# Patient Record
Sex: Female | Born: 1945 | Race: White | Hispanic: No | Marital: Married | Smoking: Former smoker
Health system: Southern US, Community
[De-identification: ages and names within clinical notes are randomized; demographics above are authoritative.]

## PROBLEM LIST (undated history)

## (undated) DIAGNOSIS — E7800 Pure hypercholesterolemia, unspecified: Secondary | ICD-10-CM

## (undated) DIAGNOSIS — I10 Essential (primary) hypertension: Secondary | ICD-10-CM

## (undated) DIAGNOSIS — I639 Cerebral infarction, unspecified: Secondary | ICD-10-CM

## (undated) DIAGNOSIS — I480 Paroxysmal atrial fibrillation: Secondary | ICD-10-CM

## (undated) DIAGNOSIS — E669 Obesity, unspecified: Secondary | ICD-10-CM

## (undated) DIAGNOSIS — M545 Low back pain, unspecified: Secondary | ICD-10-CM

## (undated) DIAGNOSIS — G8929 Other chronic pain: Secondary | ICD-10-CM

## (undated) HISTORY — DX: Cerebral infarction, unspecified: I63.9

## (undated) HISTORY — DX: Pure hypercholesterolemia, unspecified: E78.00

## (undated) HISTORY — DX: Essential (primary) hypertension: I10

## (undated) HISTORY — DX: Paroxysmal atrial fibrillation: I48.0

## (undated) HISTORY — DX: Obesity, unspecified: E66.9

---

## 1998-08-20 ENCOUNTER — Other Ambulatory Visit: Admission: RE | Admit: 1998-08-20 | Discharge: 1998-08-20 | Payer: Self-pay | Admitting: Gynecology

## 1999-10-20 ENCOUNTER — Other Ambulatory Visit: Admission: RE | Admit: 1999-10-20 | Discharge: 1999-10-20 | Payer: Self-pay | Admitting: Gynecology

## 2000-01-12 ENCOUNTER — Encounter: Admission: RE | Admit: 2000-01-12 | Discharge: 2000-01-12 | Payer: Self-pay | Admitting: Gynecology

## 2000-01-12 ENCOUNTER — Encounter: Payer: Self-pay | Admitting: Gynecology

## 2000-02-09 ENCOUNTER — Inpatient Hospital Stay (HOSPITAL_COMMUNITY): Admission: EM | Admit: 2000-02-09 | Discharge: 2000-02-14 | Payer: Self-pay | Admitting: Emergency Medicine

## 2000-02-09 ENCOUNTER — Encounter: Payer: Self-pay | Admitting: Emergency Medicine

## 2000-03-30 ENCOUNTER — Inpatient Hospital Stay (HOSPITAL_COMMUNITY): Admission: RE | Admit: 2000-03-30 | Discharge: 2000-04-04 | Payer: Self-pay | Admitting: *Deleted

## 2000-03-30 ENCOUNTER — Encounter: Payer: Self-pay | Admitting: *Deleted

## 2000-03-31 HISTORY — PX: CARDIAC CATHETERIZATION: SHX172

## 2000-05-03 ENCOUNTER — Other Ambulatory Visit: Admission: RE | Admit: 2000-05-03 | Discharge: 2000-05-03 | Payer: Self-pay | Admitting: Gynecology

## 2000-08-29 ENCOUNTER — Ambulatory Visit (HOSPITAL_COMMUNITY): Admission: RE | Admit: 2000-08-29 | Discharge: 2000-08-29 | Payer: Self-pay | Admitting: *Deleted

## 2000-10-24 ENCOUNTER — Inpatient Hospital Stay (HOSPITAL_COMMUNITY): Admission: AD | Admit: 2000-10-24 | Discharge: 2000-10-27 | Payer: Self-pay | Admitting: Cardiology

## 2000-12-27 ENCOUNTER — Other Ambulatory Visit: Admission: RE | Admit: 2000-12-27 | Discharge: 2000-12-27 | Payer: Self-pay | Admitting: Gynecology

## 2001-03-20 ENCOUNTER — Encounter: Payer: Self-pay | Admitting: Gynecology

## 2001-03-20 ENCOUNTER — Encounter: Admission: RE | Admit: 2001-03-20 | Discharge: 2001-03-20 | Payer: Self-pay | Admitting: Gynecology

## 2001-06-13 ENCOUNTER — Other Ambulatory Visit: Admission: RE | Admit: 2001-06-13 | Discharge: 2001-06-13 | Payer: Self-pay | Admitting: Gynecology

## 2001-07-03 ENCOUNTER — Emergency Department (HOSPITAL_COMMUNITY): Admission: EM | Admit: 2001-07-03 | Discharge: 2001-07-03 | Payer: Self-pay | Admitting: Emergency Medicine

## 2001-07-03 ENCOUNTER — Encounter: Payer: Self-pay | Admitting: Emergency Medicine

## 2002-02-19 ENCOUNTER — Encounter: Admission: RE | Admit: 2002-02-19 | Discharge: 2002-02-19 | Payer: Self-pay | Admitting: Family Medicine

## 2002-02-19 ENCOUNTER — Encounter: Payer: Self-pay | Admitting: Family Medicine

## 2002-03-07 ENCOUNTER — Encounter: Admission: RE | Admit: 2002-03-07 | Discharge: 2002-03-23 | Payer: Self-pay | Admitting: Family Medicine

## 2002-04-10 ENCOUNTER — Other Ambulatory Visit: Admission: RE | Admit: 2002-04-10 | Discharge: 2002-04-10 | Payer: Self-pay | Admitting: Gynecology

## 2002-04-29 ENCOUNTER — Emergency Department (HOSPITAL_COMMUNITY): Admission: EM | Admit: 2002-04-29 | Discharge: 2002-04-29 | Payer: Self-pay | Admitting: Emergency Medicine

## 2002-06-01 ENCOUNTER — Encounter: Payer: Self-pay | Admitting: Family Medicine

## 2002-06-01 ENCOUNTER — Encounter: Admission: RE | Admit: 2002-06-01 | Discharge: 2002-06-01 | Payer: Self-pay | Admitting: Family Medicine

## 2003-05-08 ENCOUNTER — Other Ambulatory Visit: Admission: RE | Admit: 2003-05-08 | Discharge: 2003-05-08 | Payer: Self-pay | Admitting: Gynecology

## 2003-06-27 ENCOUNTER — Encounter: Admission: RE | Admit: 2003-06-27 | Discharge: 2003-06-27 | Payer: Self-pay | Admitting: Gynecology

## 2003-06-27 ENCOUNTER — Encounter: Payer: Self-pay | Admitting: Gynecology

## 2004-07-28 ENCOUNTER — Ambulatory Visit (HOSPITAL_COMMUNITY): Admission: RE | Admit: 2004-07-28 | Discharge: 2004-07-28 | Payer: Self-pay | Admitting: Gynecology

## 2004-08-12 ENCOUNTER — Other Ambulatory Visit: Admission: RE | Admit: 2004-08-12 | Discharge: 2004-08-12 | Payer: Self-pay | Admitting: Gynecology

## 2004-08-28 ENCOUNTER — Encounter: Admission: RE | Admit: 2004-08-28 | Discharge: 2004-08-28 | Payer: Self-pay | Admitting: Gynecology

## 2005-08-23 ENCOUNTER — Other Ambulatory Visit: Admission: RE | Admit: 2005-08-23 | Discharge: 2005-08-23 | Payer: Self-pay | Admitting: Gynecology

## 2005-10-26 ENCOUNTER — Encounter: Admission: RE | Admit: 2005-10-26 | Discharge: 2005-10-26 | Payer: Self-pay | Admitting: Gynecology

## 2006-06-08 ENCOUNTER — Ambulatory Visit: Payer: Self-pay | Admitting: Family Medicine

## 2006-06-29 ENCOUNTER — Ambulatory Visit: Payer: Self-pay | Admitting: Family Medicine

## 2006-07-05 ENCOUNTER — Ambulatory Visit: Payer: Self-pay | Admitting: Internal Medicine

## 2006-07-06 ENCOUNTER — Ambulatory Visit: Payer: Self-pay | Admitting: Family Medicine

## 2006-07-26 ENCOUNTER — Ambulatory Visit: Payer: Self-pay | Admitting: Internal Medicine

## 2006-12-06 ENCOUNTER — Other Ambulatory Visit: Admission: RE | Admit: 2006-12-06 | Discharge: 2006-12-06 | Payer: Self-pay | Admitting: Gynecology

## 2006-12-09 ENCOUNTER — Encounter: Admission: RE | Admit: 2006-12-09 | Discharge: 2006-12-09 | Payer: Self-pay | Admitting: Gynecology

## 2007-05-22 ENCOUNTER — Ambulatory Visit: Payer: Self-pay | Admitting: Internal Medicine

## 2007-05-26 ENCOUNTER — Ambulatory Visit: Payer: Self-pay | Admitting: Internal Medicine

## 2007-05-26 ENCOUNTER — Encounter: Payer: Self-pay | Admitting: Internal Medicine

## 2007-07-18 DIAGNOSIS — I4891 Unspecified atrial fibrillation: Secondary | ICD-10-CM | POA: Insufficient documentation

## 2007-07-18 DIAGNOSIS — Z8679 Personal history of other diseases of the circulatory system: Secondary | ICD-10-CM | POA: Insufficient documentation

## 2007-07-18 DIAGNOSIS — I10 Essential (primary) hypertension: Secondary | ICD-10-CM | POA: Insufficient documentation

## 2007-08-01 ENCOUNTER — Ambulatory Visit: Payer: Self-pay | Admitting: Internal Medicine

## 2007-12-15 DIAGNOSIS — K5900 Constipation, unspecified: Secondary | ICD-10-CM | POA: Insufficient documentation

## 2007-12-15 DIAGNOSIS — D126 Benign neoplasm of colon, unspecified: Secondary | ICD-10-CM | POA: Insufficient documentation

## 2007-12-15 DIAGNOSIS — K648 Other hemorrhoids: Secondary | ICD-10-CM | POA: Insufficient documentation

## 2007-12-15 DIAGNOSIS — J329 Chronic sinusitis, unspecified: Secondary | ICD-10-CM | POA: Insufficient documentation

## 2007-12-15 DIAGNOSIS — E785 Hyperlipidemia, unspecified: Secondary | ICD-10-CM | POA: Insufficient documentation

## 2007-12-15 DIAGNOSIS — M1990 Unspecified osteoarthritis, unspecified site: Secondary | ICD-10-CM | POA: Insufficient documentation

## 2007-12-15 DIAGNOSIS — K625 Hemorrhage of anus and rectum: Secondary | ICD-10-CM | POA: Insufficient documentation

## 2008-05-15 ENCOUNTER — Encounter: Admission: RE | Admit: 2008-05-15 | Discharge: 2008-05-15 | Payer: Self-pay | Admitting: Gynecology

## 2008-05-23 ENCOUNTER — Encounter: Admission: RE | Admit: 2008-05-23 | Discharge: 2008-05-23 | Payer: Self-pay | Admitting: Gynecology

## 2008-06-10 ENCOUNTER — Other Ambulatory Visit: Admission: RE | Admit: 2008-06-10 | Discharge: 2008-06-10 | Payer: Self-pay | Admitting: Gynecology

## 2008-06-20 ENCOUNTER — Encounter: Admission: RE | Admit: 2008-06-20 | Discharge: 2008-06-20 | Payer: Self-pay | Admitting: Gynecology

## 2009-07-02 ENCOUNTER — Encounter: Admission: RE | Admit: 2009-07-02 | Discharge: 2009-07-02 | Payer: Self-pay | Admitting: Gynecology

## 2010-06-12 ENCOUNTER — Ambulatory Visit: Payer: Self-pay | Admitting: Cardiology

## 2010-07-17 ENCOUNTER — Ambulatory Visit: Payer: Self-pay | Admitting: *Deleted

## 2010-07-31 ENCOUNTER — Encounter: Admission: RE | Admit: 2010-07-31 | Discharge: 2010-07-31 | Payer: Self-pay | Admitting: Gynecology

## 2010-08-14 ENCOUNTER — Ambulatory Visit: Payer: Self-pay | Admitting: Cardiology

## 2010-09-09 ENCOUNTER — Ambulatory Visit: Payer: Self-pay | Admitting: Cardiology

## 2010-10-07 ENCOUNTER — Ambulatory Visit: Payer: Self-pay | Admitting: Cardiovascular Disease

## 2010-10-23 ENCOUNTER — Ambulatory Visit: Payer: Self-pay | Admitting: Cardiology

## 2010-11-19 ENCOUNTER — Encounter (INDEPENDENT_AMBULATORY_CARE_PROVIDER_SITE_OTHER): Payer: Medicare Other

## 2010-11-19 DIAGNOSIS — Z7901 Long term (current) use of anticoagulants: Secondary | ICD-10-CM

## 2010-11-24 ENCOUNTER — Inpatient Hospital Stay (INDEPENDENT_AMBULATORY_CARE_PROVIDER_SITE_OTHER)
Admission: RE | Admit: 2010-11-24 | Discharge: 2010-11-24 | Disposition: A | Discharge status: Home / Self Care | Payer: Medicare Other | Source: Ambulatory Visit | Attending: Family Medicine | Admitting: Family Medicine

## 2010-11-24 DIAGNOSIS — R071 Chest pain on breathing: Secondary | ICD-10-CM

## 2010-12-18 ENCOUNTER — Other Ambulatory Visit (INDEPENDENT_AMBULATORY_CARE_PROVIDER_SITE_OTHER): Payer: Medicare Other

## 2010-12-18 DIAGNOSIS — Z7901 Long term (current) use of anticoagulants: Secondary | ICD-10-CM

## 2011-01-15 ENCOUNTER — Ambulatory Visit: Payer: Self-pay | Admitting: *Deleted

## 2011-01-15 ENCOUNTER — Other Ambulatory Visit (INDEPENDENT_AMBULATORY_CARE_PROVIDER_SITE_OTHER): Payer: Medicare Other | Admitting: *Deleted

## 2011-01-15 ENCOUNTER — Other Ambulatory Visit: Payer: Self-pay | Admitting: Cardiology

## 2011-01-15 DIAGNOSIS — I4891 Unspecified atrial fibrillation: Secondary | ICD-10-CM

## 2011-01-15 DIAGNOSIS — E785 Hyperlipidemia, unspecified: Secondary | ICD-10-CM

## 2011-01-15 MED ORDER — FENOFIBRATE 160 MG PO TABS: 160.0000 mg | ORAL_TABLET | Freq: Every day | ORAL | Status: DC

## 2011-01-15 NOTE — Telephone Encounter (Signed)
escribe medication per fax request  Notified of coumadin results

## 2011-01-29 ENCOUNTER — Other Ambulatory Visit: Payer: Medicare Other | Admitting: *Deleted

## 2011-01-29 ENCOUNTER — Ambulatory Visit (INDEPENDENT_AMBULATORY_CARE_PROVIDER_SITE_OTHER): Payer: Medicare Other | Admitting: *Deleted

## 2011-01-29 DIAGNOSIS — I4891 Unspecified atrial fibrillation: Secondary | ICD-10-CM

## 2011-02-12 ENCOUNTER — Ambulatory Visit (INDEPENDENT_AMBULATORY_CARE_PROVIDER_SITE_OTHER): Payer: Medicare Other | Admitting: *Deleted

## 2011-02-12 DIAGNOSIS — I4891 Unspecified atrial fibrillation: Secondary | ICD-10-CM

## 2011-02-12 LAB — POCT INR: INR: 4.3

## 2011-02-12 NOTE — Patient Instructions (Signed)
Pt notified of Coumadin instructions and verbalized to RN understanding of instructions.   

## 2011-02-26 ENCOUNTER — Ambulatory Visit (INDEPENDENT_AMBULATORY_CARE_PROVIDER_SITE_OTHER): Payer: Medicare Other | Admitting: *Deleted

## 2011-02-26 DIAGNOSIS — I4891 Unspecified atrial fibrillation: Secondary | ICD-10-CM

## 2011-03-02 NOTE — Assessment & Plan Note (Signed)
[facility]                         GASTROENTEROLOGY OFFICE NOTE   NAME:BAILEYQuinlan, Mcfall                         MRN:          [id number]  DATE:08/01/2007                            DOB:          [birth year]    CHIEF COMPLAINT:  Bloating and gas, some constipation.   Ms. Hebenstreit had a colonoscopy August 8th.  She had 1 of 3 polyps as an  adenoma, due for followup in 5 years.  At that time, she discussed some  bloating and discomfort problems.  She was started on Align, and has had  some benefit.  Unfortunately, she still suffers from periodic  constipation helped by Milk of Magnesia.  She also has spells of  bloating.  These are less if she exercises, but she sits in front of a  computer a lot all day, and she just feels miserable sometimes with this  bloating and gaseousness.   No change in her past medical history reviewed from note of July 26, 2006, plus colonoscopy note.   MEDICATIONS:  Listed and reviewed in the chart.   PHYSICAL EXAMINATION:  Height 5 feet 6-1/2 inches.  Weight 166 pounds.  Pulse 68.  Blood pressure 122/62.  Body mass index 26.4 kg per meter  squared, which is over weight.  ABDOMEN:  Soft and nontender.  No organomegaly or masses.  Does not look  particularly distended.  Bowel sounds are present.  SKIN:  Warm and dry.  No rash.  She is alert and oriented x3.   ASSESSMENT:  I think this is irritable bowel syndrome with bloating and  gaseousness leaning toward constipation.   PLAN:  1. We are going to try Xifaxan since she has responded somewhat to      Align, 400 mg t.i.d. for 10 days.  If that is too expensive for      her, depending upon the coverage availability, we will use Cipro      500 mg b.i.d. for 10 days.  The rationale for antibiotic use in      irritable bowel syndrome was explained.  2. She will contact Dr. Ronnald Nian office prior to starting antibiotics      for advice on followup of her INRs since she takes  Coumadin      chronically.  3. She will continue the Align.  4. Gas and irritable bowel handouts are given to the patient.  5. She will follow up as needed, depending upon the response or lack      thereof.     Iva Boop, MD,FACG  Electronically Signed    CEG/MedQ  DD: 08/01/2007  DT: 08/01/2007  Job #: [id number]   cc:   Bess Kinds, MD  Colleen Can Deborah Chalk, M.D.

## 2011-03-05 NOTE — H&P (Signed)
[place]. [facility]  Patient:    Alexandra Pierce, Alexandra Pierce                         MRN: [id number] Adm. Date:  [id number] Attending:  Lorre Nick Dictator:   Marya Fossa, P.A. CC:         [facility]             Lenise Herald, M.D.                         History and Physical  ADMISSION DIAGNOSES: 1. New onset atrial fibrillation. 2. Hyperlipidemia. 3. Hormone replacement therapy.  CHIEF COMPLAINT:  Right arm numbness, tongue "thick," and lightheadedness x 24 hours with "hard heartbeats."  HISTORY OF PRESENT ILLNESS:  This is a 65 year old divorced white female patient of Dr. Loraine Grip with a history of palpitations.  Yesterday, while at work, she noticed that her right hand was numb and she had difficulty writing.  She also felt that her tongue had a thickness sensation to it and her speech was not clear per her daughters.  She had a very "spacey" feeling about her and was aware of "hard heartbeats."  All throughout yesterday evening and through to this morning, she has not felt quite right. She had trouble putting on her makeup this morning.  She did go to work and, while trying to type, noticed that her left hand was typing normally but the right hand just "couldnt find the right keys."  The patient denies history of CVA, hypertension, diabetes, or thyroid disease.  Prior to yesterday, the patient had been doing relatively well.  She has been seeing Dr. Jenne Campus for the last several months with complaints of palpitations.  Her EKGs in the office have always shown sinus rhythm and, due to financial constraints, she has not worn a Holter monitor.  On January 22, 2000, the patient was placed on Toprol XL 25 mg a day.  She had a 2-D echocardiogram in March, which showed essentially normal LV function with slightly thickened mitral valve leaflet and trace MR.  The patient denies presyncope or syncope.  As an aside, the patient also noted  frequent sensations of chest tightness at rest.  She has a history of cardiac catheterization in 1996 by Dr. Alanda Amass with reportedly normal coronaries.  At that time, she was going through a divorce and was told her symptoms were related to stress and reflux.  The tightness occurs primarily when she is having the "hard heartbeats."  ALLERGIES:  NKDA.  CURRENT MEDICATIONS: 1. Prempro 0.5 mg a day. 2. Toprol XL 25 mg a day. 3. Lipitor 20 mg a day.  PAST MEDICAL HISTORY:  See HPI.  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 She drinks wine.  She does not smoke.  She quit in 1985.  FAMILY HISTORY:  Her mother is alive at age 46 and has heart disease and hyperlipidemia.  Father deceased from diabetes and asthma.  She has two brothers; one has hyperlipidemia and one is age 63 with coronary artery disease and has had a CABG and prostate CA.  She has one sister with hyperlipidemia.  REVIEW OF SYSTEMS:  See HPI.  Denies vision changes, headaches, presyncope or syncope, peptic ulcer disease, melena, hematuria, vaginal bleeding, dysuria, fever, chills, or weight changes.  Of note, she had a total lipid profile drawn in April,  which showed a total cholesterol of 243 with an LDL of 171 and she was started on Lipitor 20 mg a day on January 22, 2000.  PHYSICAL EXAMINATION:  VITAL SIGNS:  Temperature 97.9, blood pressure 123/65.  Pulse on admission was 40 but then the patient went into rapid atrial fibrillation with a heart rate in the 160s.  Respirations 16, O2 saturation 99% on room air.  GENERAL:  The patient was alert and oriented x 2.  No acute distress. Attended by her two daughters.  HEENT:  Normocephalic, atraumatic.  PERRL; EOMI.  Funduscopic unremarkable. Nares patent.  Pharynx benign.  NECK:  Supple without bruits or masses.  LUNGS:  Clear to auscultation and percussion.  COR:  Irregular rate and rhythm without murmur.  ABDOMEN:  Soft,  nontender, nondistended.  Normal bowel sounds x 4 quadrants. No HSM.  EXTREMITIES:  2+ distal pulses without edema.  NEUROLOGIC:  Cranial nerves II-XII grossly intact.  No focal deficits noted. Strength is equal upper and lower extremities bilaterally.  LABORATORY DATA:  EKG shows atrial fibrillation with rapid ventricular response.  Heart rate is 130-150.  Chest x-ray shows no active disease.  Sodium 140, potassium 3.9, BUN 16, creatinine 0.8, glucose 96, hemoglobin 13.9.  INR 1.4, CK 89.  IMPRESSION:  New onset atrial fibrillation with rapid ventricular response--she was given IV Cardizem bolus of 20 mg and infusion at 10 mg an hour with heart rates decreased to 80-100, atrial fibrillation persisting.  IV heparin was started.  We will admit the patient and check cardiac enzymes to rule out MI.  We will obtain old records and old catheterization report.  Add proton pump inhibitor.  We will check a head CT to rule out CVA/TIA.  Continue hormone replacement therapy.  Hyperlipidemia:  Continue statin therapy.  ADDENDUM:  At 1300 hours (one hour after H&P was performed), the patient converted to normal sinus rhythm documented with EKG.  Head CT results are pending.  Course of action to be determined by Dr. Armanda Magic. DD:  02/09/00 TD:  02/09/00 Job: [id number]

## 2011-03-05 NOTE — Discharge Summary (Signed)
[place]. [facility]  Patient:    Alexandra Pierce, Alexandra Pierce                         MRN: [id number] Adm. Date:  [id number] Disc. Date: 10/27/00 Attending:  Eleanora Neighbor Dictator:   Jennet Maduro. Earl Gala, R.N., A.N.P. CC:         Nathen May, M.D., [facility]  [facility]   Discharge Summary  PRIMARY DISCHARGE DIAGNOSIS:   Recurrent episodes of atrial fibrillation/flutter with successful initiation of flecainide.  SECONDARY DIAGNOSES: 1. Prior paroxysmal atrial fibrillation/flutter, previously on sotolol as    well as short course of amiodarone. 2. Previous cerebrovascular accident involving right arm weakness as well    as mild dysarthria, April 2001, secondary to arrhythmia. 3. Chronic Coumadin. 4. Hypercholesterolemia, currently on Lipitor. 5. Prior history of cardiac catheterization x 2 showing normal left    ventricular function, no significant coronary disease.  HISTORY OF PRESENT ILLNESS:  Alexandra Pierce is a very pleasant 65 year old female who presents for elective admission for flecainide therapy.  She has had recurrent episodes of paroxysmal atrial fibrillation.  She has been maintained on chronic Coumadin.  ADMISSION LABORATORY DATA:  INR was 2.1.  Her other labs from the office were satisfactory.  HOSPITAL COURSE:  The patient was admitted.   She was started on flecainide initially at 50 mg b.i.d. which was tolerated very well.  She did have recurrent episodes of atrial fibrillation/flutter and was subsequently seen in consultation by Dr. Berton Mount to consider possibility of future ablation. Throughout the course of her hospitalization, she has basically done well. She has had very little in the way of sinus bradycardia.  Her Cardizem was able to be discontinued.  Today on October 27, 2000, she is doing quite well.  She is currently on flecainide 50 mg in the morning and 100 mg in the evening.  She is already beginning to  note fewer episodes of arrhythmia.  Our plans will now be to see how she does on the drug therapy before we would consider proceeding on with ablation, especially since she is obligated to Coumadin at this point in time.  We will now plan for discharge today with plans for outpatient stress testing next week.  CONDITION UPON DISCHARGE:  Stable.  DISCHARGE MEDICATIONS:  She will stop her Diltiazem.  Will continue flecainide 50 mg in the morning, 100 mg in the evening.  She will resume her Coumadin, except we will increase that slightly due to an increase in INR today at discharge at 1.7.  She will now be taking 2.5 mg 5 days a week and 1.25 mg on Monday and Wednesday.  She will resume her Protonix, Prempro, and Lipitor as before.  ACTIVITY:  As tolerated.  SPECIAL INSTRUCTIONS:  She has an appointment scheduled for Wednesday, January 16, at 3:30 p.m. for treadmill testing as well as followup pro time.  She is to call if any problems arise. DD:  10/27/00 TD:  10/27/00 Job: [id number]

## 2011-03-05 NOTE — Discharge Summary (Signed)
[place]. [facility]  Patient:    Alexandra Pierce, Alexandra Pierce                         MRN: [id number] Adm. Date:  [id number] Disc. Date: [id number] Attending:  Darlin Priestly Dictator:   Darcella Gasman. Annie Paras, F.N.P.C. CC:         [facility]                           Discharge Summary  ADMITTING DIAGNOSES: 1. Paroxysmal atrial fibrillation with rapid ventricular response, resolved. 2. Hyperlipidemia. 3. Transient ischemic attack. 4. Gastroesophageal reflux disease. 5. Hypokalemia, resolved. 6. Supraventricular tachycardia. 7. Sinus bradycardia, resolved. 8. Sinuses pauses, resolved.  DISCHARGE CONDITION:  Improved.  DISCHARGE MEDICATIONS: 1. Sotalol 80 mg twice a day. 2. Cardizem 30 mg four times a day. 3. Coumadin 5 mg a day. 4. Altace 2.5 mg a day. 5. Prempro 0.5 mg a day. 6. Lipitor 20 mg at h.s. 7. Protonix 40 mg daily. 8. Ativan 0.5 mg one p.o. q.8h. as needed for anxiety.  DISCHARGE INSTRUCTIONS: 1. Activity as tolerated. 2. Low-fat, low-cholesterol, low-salt diet. 3. Have lab work done Feb 16, 2000, at [facility]. 4. Follow up with Dr. Jenne Campus in two weeks.  Call the office Monday for that    appointment date and time.  HISTORY OF PRESENT ILLNESS:  A 65 year old divorced white female patient of Dr. Loraine Grip with a history of palpitations.  On the day prior to admission, at work, she noticed her right hand went numb and she had difficulty writing. Her tongue had a thickness sensation to it and her speech was not clear.  She felt "spacy."  Patient was seen in the emergency room, February 09, 2000, and did not feel quite right since the day before.  Prior to admission, patient had not had any difficulties.  She had been followed by Dr. Jenne Campus for palpitations.  EKGs in the office had shown sinus rhythm and, secondary to financial constraints, she had not worn a Holter monitor.  She was placed on Toprol XL 25 mg daily on April 6 and a 2D  echocardiogram in March revealed essentially normal LV function with slightly thickened mitral valve leaflet and trace MR.  Prior to admission, she also noticed frequent sensations of chest tightness.  She does have a history of a heart catheterization by Dr. Alanda Amass in 1996 and was told she had normal coronaries.  She was going through a divorce at the time and it was felt the chest discomfort was related to reflux disease.  ALLERGIES:  No known allergies.  OUTPATIENT MEDICATIONS: 1. Prempro 0.5 daily. 2. Toprol XL 25 daily. 3. Lipitor 20 daily.  PAST MEDICAL HISTORY:  Negative for diabetes, thyroid disease.  See history of present illness otherwise.  SOCIAL HISTORY, FAMILY HISTORY, REVIEW OF SYSTEMS:  See history and physical.  PHYSICAL EXAMINATION AT DISCHARGE:  VITAL SIGNS:  Blood pressure 114/60.  QTC was 44.  Heart rate 56, respirations 20, temperature 98.1.  Room air oxygen saturation 99%.  GENERAL:  Alert, oriented white female in no acute distress.  LUNGS:  Without rales.  HEART:  Regular rate and rhythm without S3 gallop.  EXTREMITIES:  Lower extremities without edema.  LABORATORY DATA:  Hemoglobin 13.9, hematocrit 39.4.  Prior to discharge, hemoglobin 11.9, hematocrit 33.7, WBC 7.1, MCV 90.7, platelets 430, neutrophils 61, lymphocytes 31, monocytes 3, eosinophils 2, basophils  1, leukocytes 2.  Pro time 15.4, INR 1.4, PTT 35.  Heparin was therapeutic and, prior to discharge, pro time 22.3, INR 2.8.  Chemistry:  Sodium 140, potassium 3.9, chloride 104, CO2 25, glucose 96, BUN 16, creatinine 0.8, calcium 9.3.  These remained stable.  Cardiac markers:  CK ranged from 89 to 59, MBs 1.8 to 1.0.  Troponin I less than 0.03, all negative for MI.  TSH 1.31, T3 30.5, and T4 7.2.  Urinalysis was clear.  Radiology:  Portable chest on February 09, 2000:  No evidence of acute disease. CT of the head without contrast:  Focal abnormal lucency within the first frontal cortex  which, on CT, measures roughly 1 cm and likely corresponds to a subacute infarction.  Further characterization may be helpful.  No visible hemorrhage.  On February 10, 2000, carotid Dopplers:  No significant ICA stenosis bilaterally, antegrade vertebral artery flow bilaterally.  EKG on admission, February 09, 2000, at 10:30 a.m., atrial fibrillation with rapid ventricular response, ST abnormality, atrial fibrillation new from April 19, 1995.  Follow-up at 12:49 that same day, sinus rhythm, normal EKG.  On April 25, sinus brady, otherwise normal.  On April 27, atrial fibrillation with PVCs, new atrial fibrillation and then back to sinus rhythm later that day.  On April 28, marked sinus bradycardia, heart rate 48.  HOSPITAL COURSE:  Alexandra Pierce was admitted to [facility] on February 09, 2000, with right arm numbness, a thick tongue, light-headed for 24 hours, and palpitations.  She was found to be in atrial fibrillation with rapid ventricular response.  A CT of her head showed questionable lucency, subacute infarction.  She was placed on IV heparin and IV Cardizem drip, admitted to 2000 telemetry.  CK-MBs came back all negative.  Coumadin was started and her Cardizem was changed to p.o.  She continued with paroxysmal atrial fibrillation.  She was started on sotalol and her Toprol had been discontinued.  At that point, her Cardizem was decreased to a lower rate.  By April 28, she was ready for discharge and then had some tachycardia.  The discharge was held and she was monitored for 24 more hours.  By February 14, 2000, she was ready for discharge and will be seen as an outpatient by Dr. Jenne Campus. DD:  03/14/00 TD:  03/14/00 Job: [id number]

## 2011-03-05 NOTE — Cardiovascular Report (Signed)
[place]. [facility]  Patient:    Alexandra Pierce, Alexandra Pierce                         MRN: [id number] Proc. Date: 03/31/00 Adm. Date:  [id number] Disc. Date: [id number] Attending:  Lenise Herald H                        Cardiac Catheterization  PROCEDURE: 1. Left heart catheterization. 2. Coronary angiography. 3. Left ventriculogram. 4. Distal aortogram.  ATTENDING FOR THE CASE:  Lenise Herald, M.D.  COMPLICATIONS:  None.  INDICATIONS:  [name] is a 65 year old white female with a history of intermittent atrial fibrillation, now failed sotalol therapy.  She recently has complained of chest pain and underwent a stress Cardiolite which revealed evidence of anterior wall ischemia as well as inferoseptal scar with an EF of 49%.  She is now referred for cardiac catheterization.  DESCRIPTION OF PROCEDURE:  After giving informed written consent, the patient was brought to the cardiac catheterization lab where her right and left groins were shaved, prepped, and draped in the usual sterile fashion.  ECG monitoring was established.  Using modified Seldinger technique, a 6-French arterial sheath was inserted in the right femoral artery.  The 6-French diagnostic catheters were then used to perform diagnostic angiography.  FINDINGS: 1. This revealed a large left main with no significant disease. 2. The LAD is a large vessel which coursed to the apex and gave rise to two    diagonal branches.  The LAD is noted to have a 30% ostial lesion and tapers    to a small vessel toward the apex but has no high grade lesion.  The first    diagonal is a medium sized vessel with no significant disease.  The second    diagonal is a small vessel with no significant disease. 3. The left circumflex is a large vessel which is codominant and gives rise to    two obtuse marginal branches as well as a PDA.  The AV groove circumflex    has no significant disease.  The first and second OMs are  medium sized    vessels with no significant disease.  The third OM is a medium sized vessel    with no significant disease. 4. The right coronary artery is a medium sized vessel which is codominant and    gives rise to a small PDA and posterolateral branch.  A left ventriculogram reveals mild global hypokinesis with an estimated EF of 45-50%.  Descending aortogram reveals a normal descending aorta with no evidence of renal artery stenosis.  HEMODYNAMIC DATA:  Systemic arterial pressure 145/83, LV systemic pressure 145/12.  LVEDP of 18.  Following cardiac catheterization, the Perclose device was used to successfully close the right femoral artery without complications.  One gram of Ancef was given prophylactically.  CONCLUSIONS: 1. No significant CAD. 2. Mildly depressed LV systolic function. 3. Systemic hypertension. DD:  03/31/00 TD:  04/04/00 Job: [id number]

## 2011-03-05 NOTE — H&P (Signed)
[place]. [facility]  Patient:    Alexandra, Pierce                         MRN: [id number] Adm. Date:  [id number] Attending:  Eleanora Neighbor Dictator:   Jennet Maduro. Earl Gala, R.N., A.N.P. CC:         [facility]   History and Physical  CHIEF COMPLAINT:  None.  HISTORY OF PRESENT ILLNESS:  Alexandra Pierce is a very pleasant 65 year old white female who presents for elective admission with known recurrent episodes of paroxysmal atrial fibrillation.  She has previously been placed on sotalol and has also had a short course of amiodarone.  She has subsequently had breakthrough episodes of palpitations, consistent with atrial fibrillation as well as occasional chest tightness and she is subsequently referred for elective admission to initiate further antiarrhythmic therapy.  PAST MEDICAL HISTORY: 1. Paroxysmal atrial fibrillation previously on sotalol as well as amiodarone. 2. Prior history of cerebrovascular accident involving right arm weakness as    well as mild dysarthria in April 2001, secondary to atrial fibrillation. 3. Chronic Coumadin therapy. 4. Hypercholesterolemia currently on lipitor. 5. Prior history of cardiac catheterization x 2 revealing no significant    coronary artery disease with renal artery stenosis and normal LV function.  ALLERGIES:  None known.  INTOLERANCES:  Sotalol apparently causes pedal edema.  CURRENT MEDICINES: 1. Diltiazem 30 mg t.i.d. 2. Lipitor 20 mg a day. 3. Protonix 40 mg a day. 4. Coumadin 2-1/2 mg 4 days a week 1.25 mg on Monday, Wednesday, Friday. 5. Prempro daily. 6. Protonix 40 mg a day.  FAMILY HISTORY:  Father died at 4.  Mother is living at the age of 58.  She has 2 brothers and 1 sister.  One brother has had bypass surgery at the age of 41.  SOCIAL HISTORY:  She is divorced.  She has 2 daughters, ages 38 and 45.  There has been no smoking for the past 20 years.  She previously drank 2  glasses of wine a night.  She otherwise drinks decaffeinated beverages.  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  REVIEW OF SYSTEMS:  Basically as stated above.  PHYSICAL EXAMINATION:  GENERAL:  She is a very pleasant female in no acute distress, very cooperative, and in no acute distress.  VITAL SIGNS:  Weight is 177 pounds.  Blood pressure 160/80 sitting, 170/80 standing.  Heart rate is 96 and currently irregular irregular.  Respirations are 18.  She is afebrile.  SKIN:  Warm and dry.  Color is unremarkable.  HEENT:  Unremarkable.  LUNGS:  Clear.  HEART:  Shows an irregular irregular rhythm with no murmur, no gallop.  ABDOMEN:  Soft, positive bowel sounds, nontender.  EXTREMITIES:  Show no evidence of edema.  NEUROLOGIC:  Intact with no gross focal deficits.  LABORATORY DATA:  On admission shows an INR of 2.1, her other lab studies from the office were basically satisfactory.  IMPRESSION: 1. Recurrent paroxysmal atrial fibrillation with previous sotalol and    amiodarone therapy. 2. Prior CVA secondary to atrial fibrillation. 3. Chronic Coumadin therapy. 4. Hypercholesterolemia, currently on lipitor.  PLAN:  Will proceed with elective admission.  Will initiate flecainide in the hospital at 50 mg b.i.d.  Will continue her Coumadin.  Further plans are followed per Dr. Angelina Pih discretion. DD:  10/25/00 TD:  10/25/00 Job: [id number]

## 2011-03-05 NOTE — H&P (Signed)
[place]. [facility]  Patient:    Alexandra Pierce, Alexandra Pierce                         MRN: [id number] Adm. Date:  [id number] Disc. Date: [id number] Attending:  Darlin Priestly Dictator:   [facility] CC:         Alexandra Pierce, M.D./[facility]             [facility]                         History and Physical  ADMISSION DIAGNOSES: 1. History of atrial fibrillation/flutter. 2. Trace mitral regurgitation. 3. Mild tricuspid regurgitation. 4. Status post Stress Cardiolite, 03/25/00; revealing anterior wall ischemia, with    inferoseptal scar at the apex and ejection fraction of 49%  DISCHARGE DIAGNOSES: 1. History of atrial fibrillation/flutter. 2. Trace mitral regurgitation. 3. Mild tricuspid regurgitation. 4. Status post Stress Cardiolite, 03/25/00, revealing anterior wall ischemia, with    inferoseptal scar at the apex and ejection fraction of 49% 5. Status post cardiac catheterization on 03/31/00; revealing nonobstructive    coronary artery disease. 6. Status post amiodarone loading.  HISTORY OF PRESENT ILLNESS:  Alexandra Pierce is a 65 year old white female who had been followed by Dr. Alanda Pierce for atrial fibrillation and atrial flutter.  As well, she had been seen in our office in March, complaining of intermittent palpitations nd underwent 2D-echocardiogram which showed normal LV function, slightly thickened  anterior mitral valve leaflet with trace mitral regurgitation.  There was mild R. She elected not to have a Holter monitor placed at that time and was subsequently admitted to the ER, with new onset.  She was subsequently started on IV heparin and p.o. Diltiazem as well as digoxin.  She spontaneously converted to sinus rhythm  after that point.  She was subsequently started on sotalol 80 mg b.i.d. and was  loaded on Coumadin and was sent home without further problems.  She was subsequently seen back in the office  several times since that discharge, with intermittent palpitations and it was felt that she was probably in-and-out of atrial fibrillation.  She was started on Diltiazem 30 mg q.6h., which was eventually increased to 60 mg q.6h. and was switched to a sustained relief preparation of 180 mg p.o. q.d.  She then returned to the office on 03/28/00, complaining of increasing palpitations and again, was noted to be in atrial fibrillation with a rate of 125.  Of note:  She had not taken her Diltiazem for 48 hours.  Additionally, she was scheduled for a Stress Cardiolite which she underwent on 03/25/00.  This revealed evidence of increased ____ uptake and the rest, as well as stress images and evidence of anterior wall ischemia with inferoseptal car noted at the apex.  EF was noted to be 49%  She had no further episodes of chest pain but did continue to have intermittent palpitations.  Her most recent INR in the past week, was therapeutic.   At that time, EKG in the office revealed atrial fibrillation at 125 beats/minute with prolonged QT-interval.  At that time, Alexandra Pierce was seen in the office by Dr. Jenne Pierce.  It was felt that this 65 year old white female with a history of paroxysmal atrial fibrillation,  previously on sotalol and Diltiazem with breakthrough PAF.  She had been adequately anticoagulated.  She most recently underwent a Stress Cardiolite  secondary to chest pain, which revealed evidence of atrial wall ischemia and inferoseptal scar at he apex, with mildly depressed EF.  In the office, she continued to be in atrial fibrillation, now off her Diltiazem for 48 hours with a ventricular response of 125 and acute ______ of 508, corrected.  At that point, Dr. Jenne Pierce elected to stop  her sotalol, as it did not maintain sinus rhythm and her QT was becoming prolonged. He planned to admit her when her INR is less than 2, to consider amiodarone loading and she would undergo  cardiac catheterization at that time.  She elected not to be admitted at that date, but had chosen to come in on 03/29/00, assuming her INR as therapeutic that day.  As well, he would start her on digoxin 0.125 mg p.o. q.d. while hospitalized.  HOSPITAL COURSE:  On 03/31/00, Alexandra Pierce underwent cardiac catheterization by r. Lenise Herald.  She was found to have an LV of 45 to 50%  There was no renal artery stenosis and her aorta was okay.  She had no obstructive CAD.  After the catheterization, Alexandra Pierce was loaded on amiodarone and was restarted on Coumadin, to reload post-catheterization.  On 04/01/00, Alexandra Pierce had no complaints; at that time, she was still in atrial flutter, at 87 beats/minute.  She was on IV heparin while reloaded Coumadin. As well, we were loading her amiodarone as well as being started on digoxin.  We are awaiting therapeutic INR before discharge home.  She remained stable throughout her hospitalization.  Finally, on 04/03/00, her INR was close to therapeutic at 1.8. At that time, she had no complaints.  She was afebrile at 98.1, pulse 80, blood  pressure 124/64.  Oxygen saturation 99% on room air.  LABORATORY:  Within normal range.  She was continuing to go in and out of atrial fibrillation.  However, it was felt she was close enough to therapeutic that she could be discharged home, on Coumadin, at that time; however, over the previous night, she had some bradycardia. Therefore, prior to her discharge home, Dr. Alanda Pierce planned to adjust medications.  He planned to stop the digoxin and we would decrease the Cardizem  dose.  On 04/04/00, Alexandra Pierce continued to be stable.  She was afebrile, with a blood  pressure of 120 and was maintaining normal sinus rhythm at this point.  Her INR was still subtherapeutic at 1.7; however, she was maintaining sinus rhythm and it was felt she was stable for discharge home.  HOSPITAL CONSULTATIONS:   None.  HOSPITAL PROCEDURES:  Cardiac catheterization by Dr. Lenise Herald on 03/31/00  revealing noncritical CAD and EF of 45-50%    HOSPITAL LABORATORY:  PSH normal at 2.835.  Digoxin level less than 0.2. Metabolic profile was totally normal with sodium 139, potassium 3.5, glucose 115, BUN 16 nd creatinine 0.9.  LFTs are normal.  CBC is normal with WBC 6.6, hemoglobin 12.3, hematocrit 35.3, platelets 574,000.  INR ranging from (03/30/00 through 04/04/00) are as follows:  1.6, 1.9, 1.5, 1.4, 1.4, 1.8 and 1.7.  Chest x-ray on 03/30/00 reveals no active disease.  EKG on 04/01/00 reveals sinus bradycardia at 53 beats/minute. EKG on 03/31/00 reveals atrial fibrillation at 78 beats/minute.  DISCHARGE MEDICATIONS: 1. Coumadin she is to take 5 mg on that day, and then take 2.5 mg once a day. 2. Cardizem 30 mg one q.8h. 3. Prempro, as before. 4. Amiodarone 400 mg (two 200 mg tablets) once a day. 5. Protonix 40  mg one q.d.  She is stop her sotalol and stop Altace.  On Thursday, 04/07/00, she was to have blood drawn to check a PT and INR, and fax results to Dr. Alanda Pierce.  She had an appointment to follow-up with Dr. Alanda Pierce on 04/26/00 at 12:10 p.m. DD:  04/19/00 TD:  04/19/00 Job: [id number]

## 2011-03-05 NOTE — Assessment & Plan Note (Signed)
[facility]                           GASTROENTEROLOGY OFFICE NOTE   NAME:Alexandra Pierce, Alexandra Pierce                         MRN:          [id number]  DATE:07/26/2006                            DOB:          [birth year]    CHIEF COMPLAINT:  Recommended for colonoscopy on Coumadin.   ASSESSMENT:  A 65 year old white woman with chronic rectal bleeding that is  probably hemorrhoidal.  She has not had a screening colonoscopy.  She has a  history of atrial fibrillation and a stroke related to that in 2000.  She is  maintained on Coumadin by Dr. Deborah Chalk and is currently in sinus rhythm.   RECOMMENDATIONS AND PLAN:  A colonoscopy is an appropriate procedure for  this lady, but the question is whether she should come off Coumadin with no  Lovenox window, have a Lovenox window, or stay on Coumadin for her  procedure.  We have the options of all of the above and I will request a  recommendation for Dr. Deborah Chalk, her cardiologist, before scheduling this.   HISTORY:  A 65 year old white woman who has chronic intermittent rectal  bleeding on the toilet paper with constipation.  This has been for years,  she says.  There is rare reflux, occasional nausea, and generally moves her  bowels well otherwise.  She believes she has diverticulosis and gets gas and  bloating with that.  She may have had diverticulitis in the past.   GI REVIEW OF SYSTEMS:  Otherwise, negative.   PAST MEDICAL HISTORY:  1. Atrial fibrillation.  2. Stroke.  3. Allergic chronic sinusitis.  4. Hypertension.  5. Osteoarthritis.  6. Dyslipidemia.   DRUG ALLERGIES:  SULFA causes hives and swelling.   MEDICATIONS:  1. TriCor 145 mg daily.  2. Tambocor 100 mg 3 times a day.  3. Coumadin 5 mg daily.  4. Zetia 10 mg daily.  5. Altace 2.5 mg daily.  6. Diazepam 2 mg at bedtime.   FAMILY HISTORY:  No colon cancer.  There is diabetes and heart disease in  her parents and siblings.  Prostate cancer in 2  brothers.   SOCIAL HISTORY:  She is divorced.  She is an Print production planner, 2 daughters, 2  glasses of wine a day, no tobacco or drugs.   REVIEW OF SYSTEMS:  See my medical history form for full details.   PHYSICAL EXAM:  Well-developed middle-aged white woman in no acute distress.  EYES:  Anicteric.  NECK:  Supple.  No thyromegaly or mass.  CHEST:  Clear.  HEART:  S1 and S2.  No murmurs, rubs, or gallops.  Is in regular rhythm  today.  ABDOMEN:  Soft and nontender without organomegaly or mass.  Bowel sounds are  present.  SKIN:  Sun-damaged.  PSYCH:  She is alert and oriented x3.   I appreciate the opportunity to care for this patient.  I have reviewed the  office notes and labs from Dr. Nelida Meuse office on September 12.  She had a  normal CBC, a slightly low white count of 3.4, a normal CMET, except for  glucose  of 110.  Alkaline phosphatase low at 37 and normal TSH.       Iva Boop, MD,FACG      CEG/MedQ  DD:  07/26/2006  DT:  07/27/2006  Job #:  [id number]   cc:   Tinnie Gens A. Tawanna Cooler, MD

## 2011-03-17 ENCOUNTER — Encounter: Payer: Self-pay | Admitting: Cardiology

## 2011-03-18 ENCOUNTER — Encounter: Payer: Self-pay | Admitting: Cardiology

## 2011-03-19 ENCOUNTER — Emergency Department (HOSPITAL_COMMUNITY)
Admission: EM | Admit: 2011-03-19 | Discharge: 2011-03-19 | Disposition: A | Discharge status: Home / Self Care | Payer: Medicare Other | Attending: Emergency Medicine | Admitting: Emergency Medicine

## 2011-03-19 ENCOUNTER — Encounter: Payer: Medicare Other | Admitting: *Deleted

## 2011-03-19 DIAGNOSIS — I4891 Unspecified atrial fibrillation: Secondary | ICD-10-CM | POA: Insufficient documentation

## 2011-03-19 DIAGNOSIS — Z7901 Long term (current) use of anticoagulants: Secondary | ICD-10-CM | POA: Insufficient documentation

## 2011-03-19 DIAGNOSIS — Z8673 Personal history of transient ischemic attack (TIA), and cerebral infarction without residual deficits: Secondary | ICD-10-CM | POA: Insufficient documentation

## 2011-03-19 DIAGNOSIS — E789 Disorder of lipoprotein metabolism, unspecified: Secondary | ICD-10-CM | POA: Insufficient documentation

## 2011-03-19 DIAGNOSIS — I10 Essential (primary) hypertension: Secondary | ICD-10-CM | POA: Insufficient documentation

## 2011-03-19 DIAGNOSIS — R040 Epistaxis: Secondary | ICD-10-CM | POA: Insufficient documentation

## 2011-03-19 LAB — CBC
HCT: 42.2 % (ref 36.0–46.0)
Hemoglobin: 14.5 g/dL (ref 12.0–15.0)
MCHC: 34.4 g/dL (ref 30.0–36.0)

## 2011-03-19 LAB — DIFFERENTIAL
Basophils Absolute: 0 10*3/uL (ref 0.0–0.1)
Lymphocytes Relative: 27 % (ref 12–46)
Lymphs Abs: 1.6 10*3/uL (ref 0.7–4.0)
Monocytes Absolute: 0.3 10*3/uL (ref 0.1–1.0)
Neutro Abs: 4.1 10*3/uL (ref 1.7–7.7)

## 2011-03-19 LAB — PROTIME-INR: INR: 3.43 — ABNORMAL HIGH (ref 0.00–1.49)

## 2011-03-24 ENCOUNTER — Encounter: Payer: Self-pay | Admitting: Cardiology

## 2011-03-26 ENCOUNTER — Encounter: Payer: Self-pay | Admitting: Cardiology

## 2011-03-26 ENCOUNTER — Ambulatory Visit (INDEPENDENT_AMBULATORY_CARE_PROVIDER_SITE_OTHER): Payer: Medicare Other | Admitting: *Deleted

## 2011-03-26 ENCOUNTER — Ambulatory Visit (INDEPENDENT_AMBULATORY_CARE_PROVIDER_SITE_OTHER): Payer: Medicare Other | Admitting: Cardiology

## 2011-03-26 DIAGNOSIS — I10 Essential (primary) hypertension: Secondary | ICD-10-CM

## 2011-03-26 DIAGNOSIS — I4891 Unspecified atrial fibrillation: Secondary | ICD-10-CM

## 2011-03-26 LAB — POCT INR: INR: 2.6

## 2011-03-28 ENCOUNTER — Encounter: Payer: Self-pay | Admitting: Cardiology

## 2011-03-28 NOTE — Progress Notes (Signed)
Subjective:   Alexandra Pierce is seen today for followup visit. In general, she has done well since her episode of atrial fibrillation and a CVA in April of 2001. She has no residual neurological deficit. She's been committed to long-term warfarin anticoagulation but she does have variation in her INR. She is compliant with her medications but I think at times will have excessive vitamin K related foods. Overall, she's been doing well without a significant complaints. She did have a nosebleed last week.  Her other problems include hypercholesterolemia and a history of statin intolerance but she has been able to tolerate pravastatin. She has a family history of heart disease as well as low back pain.  Current Outpatient Prescriptions  Medication Sig Dispense Refill  . acetaminophen (TYLENOL) 500 MG tablet Take 1,000 mg by mouth as needed.        . fenofibrate 160 MG tablet Take 1 tablet (160 mg total) by mouth daily.  90 tablet  3  . flecainide (TAMBOCOR) 100 MG tablet Take 100 mg by mouth 3 (three) times daily.        . pravastatin (PRAVACHOL) 20 MG tablet Take 20 mg by mouth daily.        . ramipril (ALTACE) 2.5 MG capsule TAKE TWO CAPSULES BY MOUTH IN THE MORNING AND ONE CAPSULE AT BEDTIME  90 capsule  11  . warfarin (COUMADIN) 5 MG tablet Take 5 mg by mouth daily. Take as directed by coumadin clinic         Allergies  Allergen Reactions  . Sulfonamide Derivatives     Patient Active Problem List  Diagnoses  . COLONIC POLYPS  . DYSLIPIDEMIA  . HYPERTENSION  . ATRIAL FIBRILLATION  . INTERNAL HEMORRHOIDS WITHOUT MENTION COMP  . SINUSITIS, CHRONIC  . CONSTIPATION  . RECTAL BLEEDING  . OSTEOARTHRITIS  . CEREBROVASCULAR ACCIDENT, HX OF    History  Smoking status  . Former Smoker -- 0.5 packs/day for 20 years  . Types: Cigarettes  . Quit date: 10/18/1978  Smokeless tobacco  . Never Used    History  Alcohol Use  . Yes    Family History  Problem Relation Age of Onset  . Heart disease  Father   . Asthma Father   . Diabetes Father   . Heart disease Mother   . Heart disease Brother   . Hyperlipidemia Sister   . Hyperlipidemia Brother   . Hyperlipidemia Brother     Review of Systems:   The patient denies any heat or cold intolerance.  No weight gain or weight loss.  The patient denies headaches or blurry vision.  There is no cough or sputum production.  The patient denies dizziness.  There is no hematuria or hematochezia.  The patient denies any muscle aches or arthritis.  The patient denies any rash.  The patient denies frequent falling or instability.  There is no history of depression or anxiety.  All other systems were reviewed and are negative.   Physical Exam:   Her weight is 177. Blood pressure 108/62. Heart rate 68.The head is normocephalic and atraumatic.  Pupils are equally round and reactive to light.  Sclerae nonicteric.  Conjunctiva is clear.  Oropharynx is unremarkable.  There's adequate oral airway.  Neck is supple there are no masses.  Thyroid is not enlarged.  There is no lymphadenopathy.  Lungs are clear.  Chest is symmetric.  Heart shows a regular rate and rhythm.  S1 and S2 are normal.  There is no murmur  click or gallop.  Abdomen is soft normal bowel sounds.  There is no organomegaly.  Genital and rectal deferred.  Extremities are without edema.  Peripheral pulses are adequate.  Neurologically intact.  Full range of motion.  The patient is not depressed.  Skin is warm and dry.  Assessment / Plan:

## 2011-03-28 NOTE — Assessment & Plan Note (Signed)
She has not had recurrent atrial fibrillation since she has been on flecainide. We will continue long-term warfarin anticoagulation for now. We did an exercise stress test in February 2011 which was negative for ischemia or arrhythmia but she did not have great exercise tolerance.

## 2011-03-28 NOTE — Assessment & Plan Note (Signed)
A pressure has been well-controlled on ACE inhibitor.

## 2011-04-08 ENCOUNTER — Telehealth: Payer: Self-pay | Admitting: Cardiology

## 2011-04-08 NOTE — Telephone Encounter (Signed)
Patient called today to reschedule their INR from 04/09/11 10:45am to 04/13/11 8:15am. Please call back. I have pulled the chart.

## 2011-04-09 ENCOUNTER — Encounter: Payer: Medicare Other | Admitting: *Deleted

## 2011-04-13 ENCOUNTER — Ambulatory Visit (INDEPENDENT_AMBULATORY_CARE_PROVIDER_SITE_OTHER): Payer: Medicare Other | Admitting: *Deleted

## 2011-04-13 DIAGNOSIS — I4891 Unspecified atrial fibrillation: Secondary | ICD-10-CM

## 2011-04-23 ENCOUNTER — Ambulatory Visit (INDEPENDENT_AMBULATORY_CARE_PROVIDER_SITE_OTHER): Payer: Medicare Other | Admitting: *Deleted

## 2011-04-23 DIAGNOSIS — I4891 Unspecified atrial fibrillation: Secondary | ICD-10-CM

## 2011-04-23 LAB — POCT INR: INR: 3.5

## 2011-04-30 ENCOUNTER — Encounter: Payer: Medicare Other | Admitting: *Deleted

## 2011-05-04 ENCOUNTER — Ambulatory Visit (INDEPENDENT_AMBULATORY_CARE_PROVIDER_SITE_OTHER): Payer: Medicare Other | Admitting: *Deleted

## 2011-05-04 DIAGNOSIS — I4891 Unspecified atrial fibrillation: Secondary | ICD-10-CM

## 2011-05-04 LAB — POCT INR: INR: 2.6

## 2011-05-06 ENCOUNTER — Other Ambulatory Visit: Payer: Self-pay | Admitting: Cardiology

## 2011-05-07 ENCOUNTER — Other Ambulatory Visit: Payer: Self-pay | Admitting: *Deleted

## 2011-05-07 MED ORDER — FLECAINIDE ACETATE 100 MG PO TABS: 100.0000 mg | ORAL_TABLET | Freq: Three times a day (TID) | ORAL | Status: DC

## 2011-05-07 NOTE — Telephone Encounter (Signed)
Fax received from pharmacy. Refill completed. Jodette Cathy Crounse RN  

## 2011-05-21 ENCOUNTER — Ambulatory Visit (INDEPENDENT_AMBULATORY_CARE_PROVIDER_SITE_OTHER): Payer: Medicare Other | Admitting: *Deleted

## 2011-05-21 DIAGNOSIS — I4891 Unspecified atrial fibrillation: Secondary | ICD-10-CM

## 2011-06-18 ENCOUNTER — Encounter: Payer: Medicare Other | Admitting: *Deleted

## 2011-06-25 ENCOUNTER — Encounter: Payer: Medicare Other | Admitting: *Deleted

## 2011-07-02 ENCOUNTER — Ambulatory Visit (INDEPENDENT_AMBULATORY_CARE_PROVIDER_SITE_OTHER): Payer: Medicare Other | Admitting: *Deleted

## 2011-07-02 DIAGNOSIS — I4891 Unspecified atrial fibrillation: Secondary | ICD-10-CM

## 2011-07-20 ENCOUNTER — Other Ambulatory Visit: Payer: Self-pay | Admitting: Cardiology

## 2011-07-22 ENCOUNTER — Other Ambulatory Visit: Payer: Self-pay | Admitting: *Deleted

## 2011-07-30 ENCOUNTER — Encounter: Payer: Medicare Other | Admitting: *Deleted

## 2011-08-12 ENCOUNTER — Ambulatory Visit (INDEPENDENT_AMBULATORY_CARE_PROVIDER_SITE_OTHER): Payer: Medicare Other | Admitting: *Deleted

## 2011-08-12 DIAGNOSIS — I4891 Unspecified atrial fibrillation: Secondary | ICD-10-CM

## 2011-08-12 LAB — POCT INR: INR: 3

## 2011-09-16 ENCOUNTER — Encounter: Payer: Medicare Other | Admitting: *Deleted

## 2011-09-24 ENCOUNTER — Ambulatory Visit (INDEPENDENT_AMBULATORY_CARE_PROVIDER_SITE_OTHER): Payer: Medicare Other | Admitting: *Deleted

## 2011-09-24 DIAGNOSIS — I4891 Unspecified atrial fibrillation: Secondary | ICD-10-CM

## 2011-11-05 ENCOUNTER — Ambulatory Visit (INDEPENDENT_AMBULATORY_CARE_PROVIDER_SITE_OTHER): Payer: Medicare Other | Admitting: *Deleted

## 2011-11-05 DIAGNOSIS — I4891 Unspecified atrial fibrillation: Secondary | ICD-10-CM

## 2011-11-15 ENCOUNTER — Other Ambulatory Visit: Payer: Self-pay | Admitting: Gynecology

## 2011-11-15 DIAGNOSIS — Z1231 Encounter for screening mammogram for malignant neoplasm of breast: Secondary | ICD-10-CM

## 2011-12-17 ENCOUNTER — Ambulatory Visit (INDEPENDENT_AMBULATORY_CARE_PROVIDER_SITE_OTHER): Payer: Medicare Other | Admitting: *Deleted

## 2011-12-17 ENCOUNTER — Ambulatory Visit (INDEPENDENT_AMBULATORY_CARE_PROVIDER_SITE_OTHER): Payer: Medicare Other | Admitting: Internal Medicine

## 2011-12-17 ENCOUNTER — Ambulatory Visit: Payer: Medicare Other

## 2011-12-17 ENCOUNTER — Encounter: Payer: Self-pay | Admitting: Internal Medicine

## 2011-12-17 VITALS — BP 120/58 | HR 57 | Resp 18 | Ht 66.0 in | Wt 185.0 lb

## 2011-12-17 DIAGNOSIS — I10 Essential (primary) hypertension: Secondary | ICD-10-CM

## 2011-12-17 DIAGNOSIS — I4891 Unspecified atrial fibrillation: Secondary | ICD-10-CM

## 2011-12-17 DIAGNOSIS — E785 Hyperlipidemia, unspecified: Secondary | ICD-10-CM

## 2011-12-17 DIAGNOSIS — Z8679 Personal history of other diseases of the circulatory system: Secondary | ICD-10-CM

## 2011-12-17 LAB — CBC WITH DIFFERENTIAL/PLATELET
Basophils Relative: 0.8 % (ref 0.0–3.0)
Eosinophils Absolute: 0.1 10*3/uL (ref 0.0–0.7)
Hemoglobin: 12.6 g/dL (ref 12.0–15.0)
Lymphocytes Relative: 39 % (ref 12.0–46.0)
MCHC: 33.5 g/dL (ref 30.0–36.0)
MCV: 93.5 fl (ref 78.0–100.0)
Monocytes Absolute: 0.2 10*3/uL (ref 0.1–1.0)
Neutro Abs: 2.2 10*3/uL (ref 1.4–7.7)
RBC: 4.01 Mil/uL (ref 3.87–5.11)

## 2011-12-17 LAB — LIPID PANEL
Cholesterol: 217 mg/dL — ABNORMAL HIGH (ref 0–200)
Total CHOL/HDL Ratio: 4
Triglycerides: 56 mg/dL (ref 0.0–149.0)
VLDL: 11.2 mg/dL (ref 0.0–40.0)

## 2011-12-17 LAB — BASIC METABOLIC PANEL
CO2: 27 mEq/L (ref 19–32)
Chloride: 104 mEq/L (ref 96–112)
Sodium: 138 mEq/L (ref 135–145)

## 2011-12-17 LAB — HEPATIC FUNCTION PANEL: Albumin: 4.2 g/dL (ref 3.5–5.2)

## 2011-12-17 MED ORDER — RIVAROXABAN 20 MG PO TABS: 20.0000 mg | ORAL_TABLET | Freq: Every day | ORAL | Status: DC

## 2011-12-17 MED ORDER — FLECAINIDE ACETATE 100 MG PO TABS: 100.0000 mg | ORAL_TABLET | Freq: Two times a day (BID) | ORAL | Status: DC

## 2011-12-17 NOTE — Patient Instructions (Signed)
Coumadin Clinic appt at 9:00am on 12/20/11.  STOP Coumadin.  Start Xarelto 20mg  daily once INR <3.0.  Decrease Flecainide to 100mg  twice daily.  Your physician wants you to follow-up in: 6 months. You will receive a reminder letter in the mail two months in advance. If you don't receive a letter, please call our office to schedule the follow-up appointment.

## 2011-12-19 ENCOUNTER — Encounter: Payer: Self-pay | Admitting: Internal Medicine

## 2011-12-19 NOTE — Assessment & Plan Note (Signed)
Fasting lipids and lfts

## 2011-12-19 NOTE — Assessment & Plan Note (Signed)
Start xarelto as above

## 2011-12-19 NOTE — Assessment & Plan Note (Signed)
Well controlled Decrease flecainide to 100mg  BID  Given labile INRs, I will stop coumadin today.  She has a h/o GERD.  I will therefore start xarelto rather than pradaxa. We will obtain CBC and BMET prior to initiation of xarelto.  She will return to our office on Monday for INR check.  We will start xarelto once INR < 3.

## 2011-12-19 NOTE — Progress Notes (Signed)
Candi Leash, MD, MD:  Alexandra Pierce is a 66 y.o. female with a h/o paroxysmal atrial fibrillation.  Her initial presentation of afib was with stroke.  She was evaluated by Dr Deborah Chalk and placed on coumadin.  Her INRs have been labile and frequently supratherapeutic.  She has been treated with flecainide 200mg  qam and 100mg  qpm without further episodes of afib. She presents today to establish care in the Electrophysiology clinic.   The patient reports doing very well recently and remains very active despite her age.   Today, she  denies symptoms of palpitations, chest pain, shortness of breath, orthopnea, PND, lower extremity edema, dizziness, presyncope, syncope, or neurologic sequela.  The patientis tolerating medications without difficulties and is otherwise without complaint today.   Past Medical History  Diagnosis Date  . Stroke 2001  . Hypercholesteremia   . Hypertension     mild  . Obesity   . Paroxysmal atrial fibrillation    Past Surgical History  Procedure Date  . Cardiac catheterization 03/31/2000    EF 49%/LAD lg vessel which coursed to the apex & gave rise to 2 diagonal branches/ LAD noted to have 30% ostial lesion & tapers to a sm vessel toward the apex but no high grade lesion/1st diagonal med sized vessel & 2nd diagonal sm vessel with no significant disease/ lt ventriculogram reveals mild global hypokinesis w/ an estimated EF of 45-50%    History   Social History  . Marital Status: Divorced    Spouse Name: N/A    Number of Children: N/A  . Years of Education: N/A   Occupational History  . Not on file.   Social History Main Topics  . Smoking status: Former Smoker -- 0.5 packs/day for 20 years    Types: Cigarettes    Quit date: 10/18/1978  . Smokeless tobacco: Never Used  . Alcohol Use: Yes  . Drug Use: Not on file  . Sexually Active: Not on file   Other Topics Concern  . Not on file   Social History Narrative  . No narrative on file    Family History    Problem Relation Age of Onset  . Heart disease Father   . Asthma Father   . Diabetes Father   . Heart disease Mother   . Heart disease Brother   . Hyperlipidemia Sister   . Hyperlipidemia Brother   . Hyperlipidemia Brother     Allergies  Allergen Reactions  . Sulfonamide Derivatives     Current Outpatient Prescriptions  Medication Sig Dispense Refill  . acetaminophen (TYLENOL) 500 MG tablet Take 1,000 mg by mouth as needed.        . fenofibrate 160 MG tablet Take 1 tablet (160 mg total) by mouth daily.  90 tablet  3  . flecainide (TAMBOCOR) 100 MG tablet Take 1 tablet (100 mg total) by mouth 2 (two) times daily.  90 tablet  7  . pravastatin (PRAVACHOL) 20 MG tablet TAKE ONE TABLET BY MOUTH ONE TIME DAILY  90 tablet  3  . ramipril (ALTACE) 2.5 MG capsule TAKE TWO CAPSULES BY MOUTH IN THE MORNING AND ONE CAPSULE AT BEDTIME  90 capsule  11  . Rivaroxaban (XARELTO) 20 MG TABS Take 20 mg by mouth daily.  30 tablet  11    ROS- all systems are reviewed and negative except as per HPI  Physical Exam: Filed Vitals:   12/17/11 1118  BP: 120/58  Pulse: 57  Resp: 18  Height: 5\' 6"  (1.676  m)  Weight: 185 lb (83.915 kg)    GEN- The patient is well appearing, alert and oriented x 3 today.   Head- normocephalic, atraumatic Eyes-  Sclera clear, conjunctiva pink Ears- hearing intact Oropharynx- clear Neck- supple, no JVP Lymph- no cervical lymphadenopathy Lungs- Clear to ausculation bilaterally, normal work of breathing Heart- Regular rate and rhythm, no murmurs, rubs or gallops, PMI not laterally displaced GI- soft, NT, ND, + BS Extremities- no clubbing, cyanosis, or edema MS- no significant deformity or atrophy Skin- no rash or lesion Psych- euthymic mood, full affect Neuro- strength and sensation are intact  EKG today reveals sinus rhythm 57 bpm, PR 196, QRS 112, Qtc 447, otherwise normal ekg  Assessment and Plan:

## 2011-12-19 NOTE — Assessment & Plan Note (Signed)
Stable No change required today  

## 2011-12-20 ENCOUNTER — Ambulatory Visit (INDEPENDENT_AMBULATORY_CARE_PROVIDER_SITE_OTHER): Payer: Medicare Other

## 2011-12-20 DIAGNOSIS — I4891 Unspecified atrial fibrillation: Secondary | ICD-10-CM

## 2011-12-20 LAB — POCT INR: INR: 2.3

## 2012-01-18 ENCOUNTER — Other Ambulatory Visit: Payer: Self-pay | Admitting: Nurse Practitioner

## 2012-04-05 ENCOUNTER — Other Ambulatory Visit: Payer: Self-pay | Admitting: Nurse Practitioner

## 2012-04-17 ENCOUNTER — Encounter: Payer: Self-pay | Admitting: Internal Medicine

## 2012-06-22 ENCOUNTER — Other Ambulatory Visit: Payer: Self-pay | Admitting: *Deleted

## 2012-06-22 ENCOUNTER — Other Ambulatory Visit: Payer: Self-pay | Admitting: Nurse Practitioner

## 2012-06-22 DIAGNOSIS — I4891 Unspecified atrial fibrillation: Secondary | ICD-10-CM

## 2012-06-22 MED ORDER — FLECAINIDE ACETATE 100 MG PO TABS: 100.0000 mg | ORAL_TABLET | Freq: Two times a day (BID) | ORAL | Status: DC

## 2012-06-30 ENCOUNTER — Ambulatory Visit (INDEPENDENT_AMBULATORY_CARE_PROVIDER_SITE_OTHER): Payer: Medicare Other | Admitting: Nurse Practitioner

## 2012-06-30 ENCOUNTER — Encounter: Payer: Self-pay | Admitting: Nurse Practitioner

## 2012-06-30 VITALS — BP 118/70 | HR 80 | Ht 66.0 in | Wt 185.0 lb

## 2012-06-30 DIAGNOSIS — I480 Paroxysmal atrial fibrillation: Secondary | ICD-10-CM

## 2012-06-30 DIAGNOSIS — Z7901 Long term (current) use of anticoagulants: Secondary | ICD-10-CM

## 2012-06-30 DIAGNOSIS — I4891 Unspecified atrial fibrillation: Secondary | ICD-10-CM

## 2012-06-30 DIAGNOSIS — E785 Hyperlipidemia, unspecified: Secondary | ICD-10-CM

## 2012-06-30 NOTE — Progress Notes (Signed)
Alexandra Pierce Date of Birth: 03-12-1946 Medical Record #[id number]  History of Present Illness: Alexandra Pierce is seen back today for her 6 month visit. She is seen for Dr. Johney Frame. She has a history of PAF. She has had a remote stroke and remains on chronic anticoagulation. She was quite labile with coumadin and has been switched to Xarelto. Her other problems include HTN, obesity and HLD. She has tended to not tolerate much statin therapy.  She comes in today. She is here alone. She is doing well. She broke a bone in her right foot and has been in a boot for the last 6 weeks. That has slowed her down. She is not able to walk.  She is not having chest pain. She does note more in the way of palpitations since her Flecainide was cut back at her last visit, but she is not really symptomatic. She does admit that she tends to miss the Xarelto due to needing to take it with dinner. She notes that lunch tends to be her bigger meal. She is due for fasting labs.   Current Outpatient Prescriptions on File Prior to Visit  Medication Sig Dispense Refill  . acetaminophen (TYLENOL) 500 MG tablet Take 1,000 mg by mouth as needed.        [name] Kitchen azelastine (ASTELIN) 137 MCG/SPRAY nasal spray prn      . fenofibrate 160 MG tablet TAKE ONE TABLET BY MOUTH DAILY  30 tablet  12  . flecainide (TAMBOCOR) 100 MG tablet Take 1 tablet (100 mg total) by mouth 2 (two) times daily.  60 tablet  1  . fluticasone (FLONASE) 50 MCG/ACT nasal spray prn      . pravastatin (PRAVACHOL) 20 MG tablet TAKE ONE TABLET BY MOUTH ONE TIME DAILY  90 tablet  3  . ramipril (ALTACE) 2.5 MG capsule TAKE TWO CAPSULES BY MOUTH IN THE MORNING AND ONE CAPSULE AT BEDTIME  90 capsule  5  . Rivaroxaban (XARELTO) 20 MG TABS Take 20 mg by mouth daily.  30 tablet  11  . DISCONTD: flecainide (TAMBOCOR) 100 MG tablet TAKE ONE TABLET BY MOUTH THREE TIMES DAILY  90 tablet  0    Allergies  Allergen Reactions  . Sulfonamide Derivatives     Past Medical History    Diagnosis Date  . Stroke 2001  . Hypercholesteremia   . Hypertension     mild  . Obesity   . Paroxysmal atrial fibrillation     managed with anticoagulation and rate control.     Past Surgical History  Procedure Date  . Cardiac catheterization 03/31/2000    EF 49%/LAD lg vessel which coursed to the apex & gave rise to 2 diagonal branches/ LAD noted to have 30% ostial lesion & tapers to a sm vessel toward the apex but no high grade lesion/1st diagonal med sized vessel & 2nd diagonal sm vessel with no significant disease/ lt ventriculogram reveals mild global hypokinesis w/ an estimated EF of 45-50%    History  Smoking status  . Former Smoker -- 0.5 packs/day for 20 years  . Types: Cigarettes  . Quit date: 10/18/1978  Smokeless tobacco  . Never Used    History  Alcohol Use  . Yes    Family History  Problem Relation Age of Onset  . Heart disease Father   . Asthma Father   . Diabetes Father   . Heart disease Mother   . Heart disease Brother   . Hyperlipidemia Sister   .  Hyperlipidemia Brother   . Hyperlipidemia Brother     Review of Systems: The review of systems is per the HPI.  All other systems were reviewed and are negative.  Physical Exam: BP 118/70  Pulse 80  Ht 5\' 6"  (1.676 m)  Wt 185 lb (83.915 kg)  BMI 29.86 kg/m2 Patient is very pleasant and in no acute distress. Skin is warm and dry. Color is normal.  HEENT is unremarkable. Normocephalic/atraumatic. PERRL. Sclera are nonicteric. Neck is supple. No masses. No JVD. Lungs are clear. Cardiac exam shows a regular rate and rhythm. Abdomen is soft. Extremities are without edema. Gait and ROM are intact. No gross neurologic deficits noted.   LABORATORY DATA: Scheduled for next week when fasting.    Lab Results  Component Value Date   WBC 4.3* 12/17/2011   HGB 12.6 12/17/2011   HCT 37.5 12/17/2011   PLT 362.0 12/17/2011   GLUCOSE 90 12/17/2011   CHOL 217* 12/17/2011   TRIG 56.0 12/17/2011   HDL 53.30 12/17/2011    LDLDIRECT 153.3 12/17/2011   ALT 13 12/17/2011   AST 21 12/17/2011   NA 138 12/17/2011   K 3.7 12/17/2011   CL 104 12/17/2011   CREATININE 1.0 12/17/2011   BUN 18 12/17/2011   CO2 27 12/17/2011   INR 2.3 12/20/2011    Assessment / Plan:  1. PAF - maintaining sinus. On Flecainide and Xarelto.   2. Chronic anticoagulation - she is going to take the Xarelto with lunch. She thinks this will minimize her missed doses and this tends to be a large meal for her.  3. HLD - will check lab next week when fasting.   4. Obesity - she is looking forward to getting back to her walking when her foot has healed.   We will see her back in 6 months. Check fasting labs next week. No change in her medicines but she may change the Xarelto to lunch time dosing. Patient is agreeable to this plan and will call if any problems develop in the interim.

## 2012-06-30 NOTE — Patient Instructions (Signed)
We will check fasting labs next Friday  We will see you back in 6 months.  Stay on your current medicines  Call the [facility] office at [phone] if you have any questions, problems or concerns.

## 2012-07-07 ENCOUNTER — Other Ambulatory Visit (INDEPENDENT_AMBULATORY_CARE_PROVIDER_SITE_OTHER): Payer: Medicare Other | Admitting: *Deleted

## 2012-07-07 ENCOUNTER — Telehealth: Payer: Self-pay | Admitting: *Deleted

## 2012-07-07 DIAGNOSIS — E785 Hyperlipidemia, unspecified: Secondary | ICD-10-CM

## 2012-07-07 DIAGNOSIS — Z7901 Long term (current) use of anticoagulants: Secondary | ICD-10-CM

## 2012-07-07 LAB — BASIC METABOLIC PANEL
BUN: 18 mg/dL (ref 6–23)
CO2: 28 mEq/L (ref 19–32)
Calcium: 8.9 mg/dL (ref 8.4–10.5)
Chloride: 105 mEq/L (ref 96–112)
Creatinine, Ser: 0.8 mg/dL (ref 0.4–1.2)
GFR: 78.15 mL/min (ref >60.00)
Glucose, Bld: 97 mg/dL (ref 70–99)
Potassium: 4 mEq/L (ref 3.5–5.1)
Sodium: 139 mEq/L (ref 135–145)

## 2012-07-07 LAB — LIPID PANEL
Cholesterol: 225 mg/dL — ABNORMAL HIGH (ref 0–200)
HDL: 48.1 mg/dL (ref >39.00)
Total CHOL/HDL Ratio: 5
Triglycerides: 69 mg/dL (ref 0.0–149.0)
VLDL: 13.8 mg/dL (ref 0.0–40.0)

## 2012-07-07 LAB — CBC WITH DIFFERENTIAL/PLATELET
Basophils Absolute: 0 10*3/uL (ref 0.0–0.1)
Basophils Relative: 0.8 % (ref 0.0–3.0)
Eosinophils Absolute: 0.1 10*3/uL (ref 0.0–0.7)
Eosinophils Relative: 2.7 % (ref 0.0–5.0)
HCT: 40 % (ref 36.0–46.0)
Hemoglobin: 13.1 g/dL (ref 12.0–15.0)
Lymphocytes Relative: 44 % (ref 12.0–46.0)
Lymphs Abs: 2 10*3/uL (ref 0.7–4.0)
MCHC: 32.8 g/dL (ref 30.0–36.0)
MCV: 95.3 fl (ref 78.0–100.0)
Monocytes Absolute: 0.2 10*3/uL (ref 0.1–1.0)
Monocytes Relative: 4.9 % (ref 3.0–12.0)
Neutro Abs: 2.1 10*3/uL (ref 1.4–7.7)
Neutrophils Relative %: 47.6 % (ref 43.0–77.0)
Platelets: 338 10*3/uL (ref 150.0–400.0)
RBC: 4.2 Mil/uL (ref 3.87–5.11)
RDW: 14.6 % (ref 11.5–14.6)
WBC: 4.4 10*3/uL — ABNORMAL LOW (ref 4.5–10.5)

## 2012-07-07 LAB — HEPATIC FUNCTION PANEL
ALT: 20 U/L (ref 0–35)
AST: 28 U/L (ref 0–37)
Albumin: 4.2 g/dL (ref 3.5–5.2)
Alkaline Phosphatase: 41 U/L (ref 39–117)
Bilirubin, Direct: 0.1 mg/dL (ref 0.0–0.3)
Total Bilirubin: 0.7 mg/dL (ref 0.3–1.2)
Total Protein: 7.3 g/dL (ref 6.0–8.3)

## 2012-07-07 LAB — LDL CHOLESTEROL, DIRECT: Direct LDL: 160.5 mg/dL

## 2012-07-07 NOTE — Telephone Encounter (Signed)
Message copied by Awilda Bill on Fri Jul 07, 2012  3:15 PM ------      Message from: Rosalio Macadamia      Created: Fri Jul 07, 2012  2:40 PM       Ok to report. Lipids aren't that great but she does not tolerate higher doses of statin.  Hope to get back on track with exercise once her foot heals up. Stay on same medicines. Recheck BMET/HPF/LIPIDS  in 6 months.

## 2012-07-11 NOTE — Telephone Encounter (Signed)
Left message for patient to call back regarding labs. 

## 2012-07-13 NOTE — Telephone Encounter (Signed)
Pt returned called, she can be reached at [phone]. Pt also notes you can leave the results on her vm if she doesn't answer.

## 2012-07-13 NOTE — Telephone Encounter (Signed)
Left msg on both home and cell for patient to call back regarding lab results. Vista Mink, CMA

## 2012-07-13 NOTE — Telephone Encounter (Signed)
Called and left msg on vm regarding lab results and 6 month lab appt.  Pt aware.  Vista Mink, CMA

## 2012-07-30 ENCOUNTER — Other Ambulatory Visit: Payer: Self-pay | Admitting: Nurse Practitioner

## 2012-08-23 ENCOUNTER — Other Ambulatory Visit: Payer: Self-pay | Admitting: *Deleted

## 2012-08-23 MED ORDER — RAMIPRIL 2.5 MG PO CAPS: ORAL_CAPSULE | ORAL | Status: DC

## 2012-09-17 ENCOUNTER — Other Ambulatory Visit: Payer: Self-pay | Admitting: Nurse Practitioner

## 2013-01-10 ENCOUNTER — Encounter: Payer: Self-pay | Admitting: Cardiology

## 2013-01-10 ENCOUNTER — Other Ambulatory Visit: Payer: Self-pay | Admitting: Cardiology

## 2013-01-10 DIAGNOSIS — I4891 Unspecified atrial fibrillation: Secondary | ICD-10-CM

## 2013-01-10 MED ORDER — RIVAROXABAN 20 MG PO TABS: 20.0000 mg | ORAL_TABLET | Freq: Every day | ORAL | Status: DC

## 2013-01-11 ENCOUNTER — Other Ambulatory Visit: Payer: Medicare Other

## 2013-01-19 ENCOUNTER — Ambulatory Visit (INDEPENDENT_AMBULATORY_CARE_PROVIDER_SITE_OTHER): Payer: Medicare Other | Admitting: *Deleted

## 2013-01-19 DIAGNOSIS — I10 Essential (primary) hypertension: Secondary | ICD-10-CM

## 2013-01-19 DIAGNOSIS — E785 Hyperlipidemia, unspecified: Secondary | ICD-10-CM

## 2013-01-19 DIAGNOSIS — I4891 Unspecified atrial fibrillation: Secondary | ICD-10-CM

## 2013-01-19 LAB — HEPATIC FUNCTION PANEL
ALT: 15 U/L (ref 0–35)
Alkaline Phosphatase: 45 U/L (ref 39–117)
Bilirubin, Direct: 0.1 mg/dL (ref 0.0–0.3)
Total Bilirubin: 0.7 mg/dL (ref 0.3–1.2)
Total Protein: 7.3 g/dL (ref 6.0–8.3)

## 2013-01-19 LAB — LDL CHOLESTEROL, DIRECT: Direct LDL: 163.2 mg/dL

## 2013-01-19 LAB — BASIC METABOLIC PANEL
CO2: 28 mEq/L (ref 19–32)
Calcium: 9.1 mg/dL (ref 8.4–10.5)
Chloride: 102 mEq/L (ref 96–112)
Sodium: 137 mEq/L (ref 135–145)

## 2013-01-19 LAB — LIPID PANEL
HDL: 45.6 mg/dL (ref >39.00)
Total CHOL/HDL Ratio: 5

## 2013-01-24 ENCOUNTER — Encounter: Payer: Self-pay | Admitting: Internal Medicine

## 2013-03-02 ENCOUNTER — Ambulatory Visit: Payer: Medicare Other | Admitting: Internal Medicine

## 2013-03-28 ENCOUNTER — Encounter: Payer: Self-pay | Admitting: Internal Medicine

## 2013-03-28 ENCOUNTER — Ambulatory Visit (INDEPENDENT_AMBULATORY_CARE_PROVIDER_SITE_OTHER): Payer: Medicare Other | Admitting: Internal Medicine

## 2013-03-28 VITALS — BP 128/80 | HR 64 | Ht 66.0 in | Wt 181.0 lb

## 2013-03-28 DIAGNOSIS — I4891 Unspecified atrial fibrillation: Secondary | ICD-10-CM

## 2013-03-28 DIAGNOSIS — I10 Essential (primary) hypertension: Secondary | ICD-10-CM

## 2013-03-28 DIAGNOSIS — E785 Hyperlipidemia, unspecified: Secondary | ICD-10-CM

## 2013-03-28 MED ORDER — FENOFIBRATE 160 MG PO TABS: 160.0000 mg | ORAL_TABLET | Freq: Every day | ORAL | Status: DC

## 2013-03-28 MED ORDER — FLECAINIDE ACETATE 100 MG PO TABS: 100.0000 mg | ORAL_TABLET | Freq: Two times a day (BID) | ORAL | Status: DC

## 2013-03-28 NOTE — Patient Instructions (Addendum)
Your physician wants you to follow-up in: 1 YEAR with Sunday Spillers You will receive a reminder letter in the mail two months in advance. If you don't receive a letter, please call our office to schedule the follow-up appointment.  Your physician recommends that you continue on your current medications as directed. Please refer to the Current Medication list given to you today.

## 2013-03-28 NOTE — Progress Notes (Signed)
FULP, CAMMIE, MD:  Alexandra Pierce is a 67 y.o. female with a h/o paroxysmal atrial fibrillation.  The patient reports doing very well recently and remains very active despite her age.  Her afib is controlled.  Today, she  denies symptoms of palpitations, chest pain, shortness of breath, orthopnea, PND, lower extremity edema, dizziness, presyncope, syncope, or neurologic sequela.  The patientis tolerating medications without difficulties and is otherwise without complaint today.   Past Medical History  Diagnosis Date  . Stroke 2001  . Hypercholesteremia   . Hypertension     mild  . Obesity   . Paroxysmal atrial fibrillation     managed with anticoagulation and rate control.    Past Surgical History  Procedure Laterality Date  . Cardiac catheterization  03/31/2000    EF 49%/LAD lg vessel which coursed to the apex & gave rise to 2 diagonal branches/ LAD noted to have 30% ostial lesion & tapers to a sm vessel toward the apex but no high grade lesion/1st diagonal med sized vessel & 2nd diagonal sm vessel with no significant disease/ lt ventriculogram reveals mild global hypokinesis w/ an estimated EF of 45-50%    History   Social History  . Marital Status: Divorced    Spouse Name: N/A    Number of Children: N/A  . Years of Education: N/A   Occupational History  . Not on file.   Social History Main Topics  . Smoking status: Former Smoker -- 0.50 packs/day for 20 years    Types: Cigarettes    Quit date: 10/18/1978  . Smokeless tobacco: Never Used  . Alcohol Use: Yes  . Drug Use: Not on file  . Sexually Active: Not on file   Other Topics Concern  . Not on file   Social History Narrative  . No narrative on file    Family History  Problem Relation Age of Onset  . Heart disease Father   . Asthma Father   . Diabetes Father   . Heart disease Mother   . Heart disease Brother   . Hyperlipidemia Sister   . Hyperlipidemia Brother   . Hyperlipidemia Brother     Allergies   Allergen Reactions  . Sulfonamide Derivatives     Current Outpatient Prescriptions  Medication Sig Dispense Refill  . acetaminophen (TYLENOL) 500 MG tablet Take 1,000 mg by mouth as needed.        [name] Kitchen azelastine (ASTELIN) 137 MCG/SPRAY nasal spray as needed. prn      . fenofibrate 160 MG tablet TAKE ONE TABLET BY MOUTH DAILY  30 tablet  12  . flecainide (TAMBOCOR) 100 MG tablet Take 1 tablet (100 mg total) by mouth 2 (two) times daily.  60 tablet  9  . fluticasone (FLONASE) 50 MCG/ACT nasal spray as needed. prn      . pravastatin (PRAVACHOL) 20 MG tablet TAKE ONE TABLET BY MOUTH ONE TIME DAILY  90 tablet  2  . ramipril (ALTACE) 2.5 MG capsule Take 2.5 mg by mouth 3 (three) times daily. Take 2 caps in the AM and 1 cap in the PM      . Rivaroxaban (XARELTO) 20 MG TABS Take 1 tablet (20 mg total) by mouth daily.  30 tablet  5   No current facility-administered medications for this visit.   Physical Exam: Filed Vitals:   03/28/13 1541  BP: 128/80  Pulse: 64  Height: 5\' 6"  (1.676 m)  Weight: 181 lb (82.101 kg)    GEN- The patient  is well appearing, alert and oriented x 3 today.   Head- normocephalic, atraumatic Eyes-  Sclera clear, conjunctiva pink Ears- hearing intact Oropharynx- clear Neck- supple, no JVP Lymph- no cervical lymphadenopathy Lungs- Clear to ausculation bilaterally, normal work of breathing Heart- Regular rate and rhythm, no murmurs, rubs or gallops, PMI not laterally displaced GI- soft, NT, ND, + BS Extremities- no clubbing, cyanosis, or edema Neuro- strength and sensation are intact  EKG today reveals sinus rhythm 64 bpm,  normal ekg  Assessment and Plan:  1. afib Well controlled No changes today  2. HTN Stable No change required today  Return to see Alexandra Pierce in a year

## 2013-04-30 ENCOUNTER — Other Ambulatory Visit: Payer: Self-pay | Admitting: Nurse Practitioner

## 2013-04-30 ENCOUNTER — Telehealth: Payer: Self-pay | Admitting: *Deleted

## 2013-04-30 NOTE — Telephone Encounter (Signed)
S/w pt who needed a refill on fenobribrate (160) mg daily was already sent into [facility] in June with 12 refills pt stated uses [facility] on [address] so i sent script in today

## 2013-06-22 ENCOUNTER — Other Ambulatory Visit: Payer: Self-pay | Admitting: Internal Medicine

## 2013-09-28 ENCOUNTER — Other Ambulatory Visit: Payer: Self-pay | Admitting: Internal Medicine

## 2013-10-01 ENCOUNTER — Other Ambulatory Visit: Payer: Self-pay | Admitting: Internal Medicine

## 2013-11-13 ENCOUNTER — Other Ambulatory Visit: Payer: Self-pay | Admitting: Nurse Practitioner

## 2013-12-08 ENCOUNTER — Other Ambulatory Visit: Payer: Self-pay | Admitting: Internal Medicine

## 2013-12-10 ENCOUNTER — Ambulatory Visit: Payer: Medicare Other | Admitting: Family Medicine

## 2014-01-11 ENCOUNTER — Inpatient Hospital Stay (HOSPITAL_COMMUNITY): Payer: Medicare Other

## 2014-01-11 ENCOUNTER — Observation Stay (HOSPITAL_COMMUNITY)
Admission: EM | Admit: 2014-01-11 | Discharge: 2014-01-12 | DRG: 065 | Disposition: A | Discharge status: Home / Self Care | Payer: Medicare Other | Attending: Internal Medicine | Admitting: Internal Medicine

## 2014-01-11 ENCOUNTER — Encounter (HOSPITAL_COMMUNITY): Payer: Self-pay | Admitting: Emergency Medicine

## 2014-01-11 ENCOUNTER — Emergency Department (HOSPITAL_COMMUNITY): Payer: Medicare Other

## 2014-01-11 DIAGNOSIS — I4891 Unspecified atrial fibrillation: Secondary | ICD-10-CM | POA: Diagnosis present

## 2014-01-11 DIAGNOSIS — R471 Dysarthria and anarthria: Secondary | ICD-10-CM | POA: Diagnosis present

## 2014-01-11 DIAGNOSIS — R4701 Aphasia: Secondary | ICD-10-CM | POA: Diagnosis not present

## 2014-01-11 DIAGNOSIS — Z7901 Long term (current) use of anticoagulants: Secondary | ICD-10-CM | POA: Diagnosis not present

## 2014-01-11 DIAGNOSIS — E785 Hyperlipidemia, unspecified: Secondary | ICD-10-CM | POA: Diagnosis present

## 2014-01-11 DIAGNOSIS — R29898 Other symptoms and signs involving the musculoskeletal system: Secondary | ICD-10-CM | POA: Diagnosis present

## 2014-01-11 DIAGNOSIS — G8190 Hemiplegia, unspecified affecting unspecified side: Secondary | ICD-10-CM | POA: Diagnosis not present

## 2014-01-11 DIAGNOSIS — Z87891 Personal history of nicotine dependence: Secondary | ICD-10-CM | POA: Diagnosis not present

## 2014-01-11 DIAGNOSIS — Z79899 Other long term (current) drug therapy: Secondary | ICD-10-CM

## 2014-01-11 DIAGNOSIS — Z8673 Personal history of transient ischemic attack (TIA), and cerebral infarction without residual deficits: Secondary | ICD-10-CM | POA: Diagnosis not present

## 2014-01-11 DIAGNOSIS — I639 Cerebral infarction, unspecified: Secondary | ICD-10-CM | POA: Diagnosis present

## 2014-01-11 DIAGNOSIS — R29810 Facial weakness: Secondary | ICD-10-CM | POA: Diagnosis not present

## 2014-01-11 DIAGNOSIS — I10 Essential (primary) hypertension: Secondary | ICD-10-CM | POA: Diagnosis not present

## 2014-01-11 DIAGNOSIS — I6350 Cerebral infarction due to unspecified occlusion or stenosis of unspecified cerebral artery: Secondary | ICD-10-CM | POA: Diagnosis not present

## 2014-01-11 DIAGNOSIS — E7800 Pure hypercholesterolemia, unspecified: Secondary | ICD-10-CM | POA: Diagnosis present

## 2014-01-11 DIAGNOSIS — E876 Hypokalemia: Secondary | ICD-10-CM | POA: Diagnosis not present

## 2014-01-11 DIAGNOSIS — Z23 Encounter for immunization: Secondary | ICD-10-CM | POA: Diagnosis not present

## 2014-01-11 DIAGNOSIS — E669 Obesity, unspecified: Secondary | ICD-10-CM | POA: Diagnosis present

## 2014-01-11 DIAGNOSIS — R531 Weakness: Secondary | ICD-10-CM

## 2014-01-11 DIAGNOSIS — Z8679 Personal history of other diseases of the circulatory system: Secondary | ICD-10-CM

## 2014-01-11 DIAGNOSIS — G459 Transient cerebral ischemic attack, unspecified: Secondary | ICD-10-CM

## 2014-01-11 LAB — RAPID URINE DRUG SCREEN, HOSP PERFORMED
Amphetamines: NOT DETECTED
BARBITURATES: NOT DETECTED
Benzodiazepines: NOT DETECTED
Cocaine: NOT DETECTED
OPIATES: NOT DETECTED
TETRAHYDROCANNABINOL: NOT DETECTED

## 2014-01-11 LAB — DIFFERENTIAL
BASOS PCT: 0 % (ref 0–1)
Basophils Absolute: 0 10*3/uL (ref 0.0–0.1)
Eosinophils Absolute: 0.2 10*3/uL (ref 0.0–0.7)
Eosinophils Relative: 2 % (ref 0–5)
Lymphocytes Relative: 36 % (ref 12–46)
Lymphs Abs: 2.9 10*3/uL (ref 0.7–4.0)
MONOS PCT: 6 % (ref 3–12)
Monocytes Absolute: 0.5 10*3/uL (ref 0.1–1.0)
NEUTROS ABS: 4.5 10*3/uL (ref 1.7–7.7)
NEUTROS PCT: 56 % (ref 43–77)

## 2014-01-11 LAB — CBC
HCT: 39.8 % (ref 36.0–46.0)
HEMOGLOBIN: 13.7 g/dL (ref 12.0–15.0)
MCH: 32.5 pg (ref 26.0–34.0)
MCHC: 34.4 g/dL (ref 30.0–36.0)
MCV: 94.3 fL (ref 78.0–100.0)
PLATELETS: 359 10*3/uL (ref 150–400)
RBC: 4.22 MIL/uL (ref 3.87–5.11)
RDW: 15 % (ref 11.5–15.5)
WBC: 8.1 10*3/uL (ref 4.0–10.5)

## 2014-01-11 LAB — COMPREHENSIVE METABOLIC PANEL WITH GFR
ALT: 12 U/L (ref 0–35)
AST: 13 U/L (ref 0–37)
Albumin: 3.8 g/dL (ref 3.5–5.2)
Alkaline Phosphatase: 43 U/L (ref 39–117)
BUN: 26 mg/dL — ABNORMAL HIGH (ref 6–23)
CO2: 26 meq/L (ref 19–32)
Calcium: 9 mg/dL (ref 8.4–10.5)
Chloride: 102 meq/L (ref 96–112)
Creatinine, Ser: 0.88 mg/dL (ref 0.50–1.10)
GFR calc Af Amer: 76 mL/min — ABNORMAL LOW
GFR calc non Af Amer: 66 mL/min — ABNORMAL LOW
Glucose, Bld: 104 mg/dL — ABNORMAL HIGH (ref 70–99)
Potassium: 3.6 meq/L — ABNORMAL LOW (ref 3.7–5.3)
Sodium: 142 meq/L (ref 137–147)
Total Bilirubin: 0.5 mg/dL (ref 0.3–1.2)
Total Protein: 7.2 g/dL (ref 6.0–8.3)

## 2014-01-11 LAB — HEMOGLOBIN A1C
HEMOGLOBIN A1C: 5.6 % (ref <5.7)
MEAN PLASMA GLUCOSE: 114 mg/dL (ref <117)

## 2014-01-11 LAB — URINALYSIS, ROUTINE W REFLEX MICROSCOPIC
Bilirubin Urine: NEGATIVE
Glucose, UA: NEGATIVE mg/dL
Hgb urine dipstick: NEGATIVE
Ketones, ur: NEGATIVE mg/dL
Leukocytes, UA: NEGATIVE
NITRITE: NEGATIVE
PROTEIN: NEGATIVE mg/dL
Specific Gravity, Urine: 1.009 (ref 1.005–1.030)
UROBILINOGEN UA: 0.2 mg/dL (ref 0.0–1.0)
pH: 7 (ref 5.0–8.0)

## 2014-01-11 LAB — I-STAT CHEM 8, ED
BUN: 25 mg/dL — ABNORMAL HIGH (ref 6–23)
Calcium, Ion: 1.15 mmol/L (ref 1.13–1.30)
Chloride: 104 meq/L (ref 96–112)
Creatinine, Ser: 1 mg/dL (ref 0.50–1.10)
Glucose, Bld: 101 mg/dL — ABNORMAL HIGH (ref 70–99)
HCT: 40 % (ref 36.0–46.0)
Hemoglobin: 13.6 g/dL (ref 12.0–15.0)
Potassium: 3.4 meq/L — ABNORMAL LOW (ref 3.7–5.3)
Sodium: 142 meq/L (ref 137–147)
TCO2: 26 mmol/L (ref 0–100)

## 2014-01-11 LAB — I-STAT TROPONIN, ED: Troponin i, poc: 0.01 ng/mL (ref 0.00–0.08)

## 2014-01-11 LAB — GLUCOSE, CAPILLARY
Glucose-Capillary: 67 mg/dL — ABNORMAL LOW (ref 70–99)
Glucose-Capillary: 91 mg/dL (ref 70–99)
Glucose-Capillary: 119 mg/dL — ABNORMAL HIGH (ref 70–99)

## 2014-01-11 LAB — ETHANOL: Alcohol, Ethyl (B): <11 mg/dL (ref 0–11)

## 2014-01-11 LAB — PROTIME-INR
INR: 1.03 (ref 0.00–1.49)
Prothrombin Time: 13.3 s (ref 11.6–15.2)

## 2014-01-11 LAB — APTT: aPTT: 30 s (ref 24–37)

## 2014-01-11 MED ORDER — FENOFIBRATE 160 MG PO TABS
160.0000 mg | ORAL_TABLET | Freq: Every day | ORAL | Status: DC
  Administered 2014-01-12: 160 mg via ORAL
  Filled 2014-01-11: qty 1

## 2014-01-11 MED ORDER — SODIUM CHLORIDE 0.9 % IV SOLN
Freq: Once | INTRAVENOUS | Status: AC
  Administered 2014-01-11: 12:00:00 via INTRAVENOUS

## 2014-01-11 MED ORDER — FLECAINIDE ACETATE 100 MG PO TABS
100.0000 mg | ORAL_TABLET | Freq: Two times a day (BID) | ORAL | Status: DC
  Administered 2014-01-12: 100 mg via ORAL
  Filled 2014-01-11 (×2): qty 1

## 2014-01-11 MED ORDER — RIVAROXABAN 20 MG PO TABS
20.0000 mg | ORAL_TABLET | Freq: Every day | ORAL | Status: DC
  Administered 2014-01-11 – 2014-01-12 (×2): 20 mg via ORAL
  Filled 2014-01-11 (×2): qty 1

## 2014-01-11 MED ORDER — SIMVASTATIN 20 MG PO TABS
20.0000 mg | ORAL_TABLET | Freq: Every day | ORAL | Status: DC
  Administered 2014-01-11 – 2014-01-12 (×2): 20 mg via ORAL
  Filled 2014-01-11 (×2): qty 1

## 2014-01-11 NOTE — ED Notes (Signed)
EDP alerted of pt sx; code stroke called

## 2014-01-11 NOTE — ED Notes (Signed)
Patient transported to MRI 

## 2014-01-11 NOTE — Progress Notes (Signed)
ANTICOAGULATION CONSULT NOTE - Initial Consult  Pharmacy Consult for xarelto Indication: atrial fibrillation  Allergies  Allergen Reactions  . Sulfonamide Derivatives     Patient Measurements: Height: 5\' 6"  (167.6 cm) Weight: 175 lb (79.379 kg) IBW/kg (Calculated) : 59.3   Vital Signs: Temp: 97.8 F (36.6 C) (03/27 1044) Temp src: Oral (03/27 1044) BP: 127/83 mmHg (03/27 1230) Pulse Rate: 90 (03/27 1230)  Labs:  Recent Labs  01/11/14 1042 01/11/14 1102  HGB 13.7 13.6  HCT 39.8 40.0  PLT 359  --   APTT 30  --   LABPROT 13.3  --   INR 1.03  --   CREATININE 0.88 1.00    Estimated Creatinine Clearance: 56.4 ml/min (by C-G formula based on Cr of 1).   Medical History: Past Medical History  Diagnosis Date  . Stroke 2001  . Hypercholesteremia   . Hypertension     mild  . Obesity   . Paroxysmal atrial fibrillation     managed with anticoagulation and rate control.   . Atrial fibrillation     Assessment: 68 yo F w/ PMH of stroke in 2000 admitted 3/27 as a code stroke.  She was not given tPA as she was on xarelto PTA.   Pharmacy consulted to dose xarelto for afib.  Her home dose is 20 mg daily with last dose 3/26.   Creat 1.0, INR 1.0; Hg 13.6, pltc 359, wt 79 kg.  No bleeding reported.  Her neurologic symptoms have resolved.   Goal of Therapy:  Prevent stroke  Plan:  1. Continue home dose of xarelto 20 mg qsupper 2. Monitor renal function, CBC, s/sxs bleeding.  Eudelia Bunch, Pharm.D. [phone] 01/11/2014 2:11 PM

## 2014-01-11 NOTE — H&P (Signed)
History and Physical    Alexandra Pierce:[id number] DOB: 23-Jun-1946 DOA: 01/11/2014  Referring physician: Dr. Ashok Cordia PCP: Antony Blackbird, MD  Specialists: Neurology  Chief Complaint: aphasia, right sided weakness  HPI: Alexandra Pierce is a 68 y.o. female has a past medical history significant for HTN, HLD, A fib on chronic anticoagulation, presented to the ED with a chief complaint of facial asymmetry of apparently sudden onset this morning at 9:45, dysarthria and right sided weakness, lasting less than half an hour and resolved by the time she arrived in the ED. She woke up around 7 am this morning ans was normal at that time. Denies any chest pain, shortness of breath. She had no recent fever or chills. No abdominal pain, NV or D. No lightheadedness or dizziness. Felt completely normal prior to event and returned to normal on my evaluation.   She arrived in the ED where stroke code was initiated, no tPA since she is on anticoagulation. Neurology evaluated patient and will be admitted to further workup.   Review of Systems: as per HPI otherwise negative.   Past Medical History  Diagnosis Date  . Stroke 2001  . Hypercholesteremia   . Hypertension     mild  . Obesity   . Paroxysmal atrial fibrillation     managed with anticoagulation and rate control.   . Atrial fibrillation    Past Surgical History  Procedure Laterality Date  . Cardiac catheterization  03/31/2000    EF 49%/LAD lg vessel which coursed to the apex & gave rise to 2 diagonal branches/ LAD noted to have 30% ostial lesion & tapers to a sm vessel toward the apex but no high grade lesion/1st diagonal med sized vessel & 2nd diagonal sm vessel with no significant disease/ lt ventriculogram reveals mild global hypokinesis w/ an estimated EF of 45-50%   Social History:  reports that she quit smoking about 35 years ago. Her smoking use included Cigarettes. She has a 10 pack-year smoking history. She has never used smokeless tobacco.  She reports that she drinks alcohol. Her drug history is not on file.  Allergies  Allergen Reactions  . Sulfonamide Derivatives     Family History  Problem Relation Age of Onset  . Heart disease Father   . Asthma Father   . Diabetes Father   . Heart disease Mother   . Heart disease Brother   . Hyperlipidemia Sister   . Hyperlipidemia Brother   . Hyperlipidemia Brother    Prior to Admission medications   Medication Sig Start Date End Date Taking? Authorizing Provider  acetaminophen (TYLENOL) 500 MG tablet Take 1,000 mg by mouth as needed.      Historical Provider, MD  azelastine (ASTELIN) 137 MCG/SPRAY nasal spray as needed. prn 04/28/12   Historical Provider, MD  fenofibrate 160 MG tablet Take 1 tablet (160 mg total) by mouth daily. 03/28/13   Thompson Grayer, MD  fenofibrate 160 MG tablet TAKE ONE TABLET BY MOUTH DAILY. 04/30/13   Burtis Junes, NP  flecainide (TAMBOCOR) 100 MG tablet Take 1 tablet (100 mg total) by mouth 2 (two) times daily. 11/13/13   Thompson Grayer, MD  fluticasone (FLONASE) 50 MCG/ACT nasal spray as needed. prn 04/28/12   Historical Provider, MD  pravastatin (PRAVACHOL) 20 MG tablet Take one tablet by mouth one time daily 10/01/13   Thompson Grayer, MD  ramipril (ALTACE) 2.5 MG capsule Take 2.5 mg by mouth 3 (three) times daily. Take 2 caps in the AM  and 1 cap in the PM 08/23/12   Thompson Grayer, MD  ramipril (ALTACE) 2.5 MG capsule TAKE TWO CAPSULES BY MOUTH IN THE MORNING AND  1  CAPSULE  IN  THE  EVENING 06/22/13   Thompson Grayer, MD  XARELTO 20 MG TABS tablet Take one tablet by mouth one time daily    Thompson Grayer, MD   Physical Exam: Filed Vitals:   01/11/14 1115 01/11/14 1130 01/11/14 1150 01/11/14 1201  BP: 123/69 125/82  132/79  Pulse: 81 85 87 95  Temp:      TempSrc:      Resp: 19 14 13 11   Height:      Weight:      SpO2: 100% 96% 96% 98%    General:  No apparent distress  Eyes: PERRL, EOMI, no scleral icterus  ENT: moist oropharynx  Neck: supple, no  JVD  Cardiovascular: regular rate without MRG; 2+ peripheral pulses  Respiratory: CTA biL, good air movement without wheezing, rhonchi or crackled  Abdomen: soft, non tender to palpation, positive bowel sounds, no guarding, no rebound  Skin: no rashes  Musculoskeletal: no peripheral edema  Psychiatric: normal mood and affect  Neurologic: no focal findings.   Labs on Admission:  Basic Metabolic Panel:  Recent Labs Lab 01/11/14 1042 01/11/14 1102  NA 142 142  K 3.6* 3.4*  CL 102 104  CO2 26  --   GLUCOSE 104* 101*  BUN 26* 25*  CREATININE 0.88 1.00  CALCIUM 9.0  --    Liver Function Tests:  Recent Labs Lab 01/11/14 1042  AST 13  ALT 12  ALKPHOS 43  BILITOT 0.5  PROT 7.2  ALBUMIN 3.8   CBC:  Recent Labs Lab 01/11/14 1042 01/11/14 1102  WBC 8.1  --   NEUTROABS 4.5  --   HGB 13.7 13.6  HCT 39.8 40.0  MCV 94.3  --   PLT 359  --    Radiological Exams on Admission: Ct Head Wo Contrast  01/11/2014   CLINICAL DATA:  Code stroke.  Right-sided weakness.  Dysarthria.  EXAM: CT HEAD WITHOUT CONTRAST  TECHNIQUE: Contiguous axial images were obtained from the base of the skull through the vertex without intravenous contrast.  COMPARISON:  None.  FINDINGS: Ventricle size is normal. Negative for acute or chronic infarction. Negative for hemorrhage or fluid collection. Negative for mass or edema. No shift of the midline structures.  Calvarium is intact.  IMPRESSION: Negative CT of the head  Critical Value/emergent results were called by telephone at the time of interpretation on 01/11/2014 at 11:07 AM to Dr. Lajean Saver , who verbally acknowledged these results.   Electronically Signed   By: Franchot Gallo M.D.   On: 01/11/2014 11:07   EKG: Independently reviewed. A fib  Assessment/Plan Principal Problem:   TIA (transient ischemic attack) Active Problems:   DYSLIPIDEMIA   HYPERTENSION   Atrial fibrillation   CEREBROVASCULAR ACCIDENT, HX OF  TIA - symptoms  resolved, will admit to telemetry, MRI, 2D echo, carotids.  - lipid panel, A1C - Neurology saw patient the emergency room, stroke team will follow on the floor. Hyperlipidemia - continue statin Hypertension - hold antihypertensives allow permissive hypertension for now. Atrial fibrillation - resume Xarelto, flecainide. Monitor on telemetry History of CVA - in 2000. No residual deficits   Diet: N.p.o., speech to evaluate Fluids: None DVT Prophylaxis: Anticoagulated  Code Status: Full  Family Communication: d/w patient  Disposition Plan: inpatient  Time spent: 69  This note  has been created with Surveyor, quantity. Any transcriptional errors are unintentional.   Costin M. Cruzita Lederer, MD [facility] Pager [phone]  If 7PM-7AM, please contact night-coverage [email or web address] Password [email or web address] 01/11/2014, 12:53 PM

## 2014-01-11 NOTE — Consult Note (Signed)
Referring Physician: ED    Chief Complaint: CODE STROKE: APHASIA, RIGHT HEMIPARESIS, RIGHT FACE WEAKNESS  HPI:                                                                                                                                         Alexandra Pierce is an 68 y.o. female, right handed, with a past medical history significant for HTN, atrial fibrillation on xarelto, stroke in 2000 without residual deficits, brought in by her husband due to acute onset of the above stated symptoms. She was last known well at 945 am today. She said that around that time she was home and dropped her comb twice, then looked to the mirror and her face was not right. Talked to her husband on the phone approximately at 10 am and was noted to have difficulty expressing herself. Alexandra Pierce indicated that the whole episode of difficulty talking lasted 20 minutes.  According to the ER nursing staff, by the time she reached the bridge she was already speaking well. Initial NIHSS 0. CT brain showed no acute abnormality. Denies HA, vertigo, double vision, difficulty swallowing, confusion, imbalance, focal numbness, or visual disturbances.  Date last known well: 01/11/14 Time last known well: 945 am tPA Given: no, symptoms resolved NIHSS: 0 MRS:   Past Medical History  Diagnosis Date  . Stroke 2001  . Hypercholesteremia   . Hypertension     mild  . Obesity   . Paroxysmal atrial fibrillation     managed with anticoagulation and rate control.     Past Surgical History  Procedure Laterality Date  . Cardiac catheterization  03/31/2000    EF 49%/LAD lg vessel which coursed to the apex & gave rise to 2 diagonal branches/ LAD noted to have 30% ostial lesion & tapers to a sm vessel toward the apex but no high grade lesion/1st diagonal med sized vessel & 2nd diagonal sm vessel with no significant disease/ lt ventriculogram reveals mild global hypokinesis w/ an estimated EF of 45-50%    Family History  Problem  Relation Age of Onset  . Heart disease Father   . Asthma Father   . Diabetes Father   . Heart disease Mother   . Heart disease Brother   . Hyperlipidemia Sister   . Hyperlipidemia Brother   . Hyperlipidemia Brother    Social History:  reports that she quit smoking about 35 years ago. Her smoking use included Cigarettes. She has a 10 pack-year smoking history. She has never used smokeless tobacco. She reports that she drinks alcohol. Her drug history is not on file.  Allergies:  Allergies  Allergen Reactions  . Sulfonamide Derivatives     Medications:  I have reviewed the patient's current medications.  ROS:                                                                                                                                       History obtained from the patient, husband, and chart review  General ROS: negative for - chills, fatigue, fever, night sweats, weight gain or weight loss Psychological ROS: negative for - behavioral disorder, hallucinations, memory difficulties, mood swings or suicidal ideation Ophthalmic ROS: negative for - blurry vision, double vision, eye pain or loss of vision ENT ROS: negative for - epistaxis, nasal discharge, oral lesions, sore throat, tinnitus or vertigo Allergy and Immunology ROS: negative for - hives or itchy/watery eyes Hematological and Lymphatic ROS: negative for - bleeding problems, bruising or swollen lymph nodes Endocrine ROS: negative for - galactorrhea, hair pattern changes, polydipsia/polyuria or temperature intolerance Respiratory ROS: negative for - cough, hemoptysis, shortness of breath or wheezing Cardiovascular ROS: negative for - chest pain, dyspnea on exertion, edema or irregular heartbeat Gastrointestinal ROS: negative for - abdominal pain, diarrhea, hematemesis, nausea/vomiting or stool  incontinence Genito-Urinary ROS: negative for - dysuria, hematuria, incontinence or urinary frequency/urgency Musculoskeletal ROS: negative for - joint swelling or muscular weakness Neurological ROS: as noted in HPI Dermatological ROS: negative for rash and skin lesion changes  Physical exam: pleasant female in no apparent distress. Blood pressure 148/83, pulse 96, temperature 97.8 F (36.6 C), temperature source Oral, resp. rate 21, height 5\' 6"  (1.676 m), weight 79.379 kg (175 lb), SpO2 100.00%. Head: normocephalic. Neck: supple, no bruits, no JVD. Cardiac: no murmurs. Lungs: clear. Abdomen: soft, no tender, no mass. Extremities: no edema. CV: pulses palpable throughout  Neurologic Examination:                                                                                                      Mental Status: Alert, oriented, thought content appropriate.  Speech fluent without evidence of aphasia.  Able to follow 3 step commands without difficulty. Cranial Nerves: II: Discs flat bilaterally; Visual fields grossly normal, pupils equal, round, reactive to light and accommodation III,IV, VI: ptosis not present, extra-ocular motions intact bilaterally V,VII: smile symmetric, facial light touch sensation normal bilaterally VIII: hearing normal bilaterally IX,X: gag reflex present XI: bilateral shoulder shrug XII: midline tongue extension without atrophy or fasciculations  Motor: Right : Upper extremity   5/5    Left:     Upper extremity   5/5  Lower extremity  5/5     Lower extremity   5/5 Tone and bulk:normal tone throughout; no atrophy noted Sensory: Pinprick and light touch intact throughout, bilaterally Deep Tendon Reflexes:  Right: Upper Extremity   Left: Upper extremity   biceps (C-5 to C-6) 2/4   biceps (C-5 to C-6) 2/4 tricep (C7) 2/4    triceps (C7) 2/4 Brachioradialis (C6) 2/4  Brachioradialis (C6) 2/4  Lower Extremity Lower Extremity  quadriceps (L-2 to L-4) 2/4    quadriceps (L-2 to L-4) 2/4 Achilles (S1) 2/4   Achilles (S1) 2/4  Plantars: Right: downgoing   Left: downgoing Cerebellar: normal finger-to-nose,  normal heel-to-shin test Gait:  No tested.    Results for orders placed during the hospital encounter of 01/11/14 (from the past 48 hour(s))  CBC     Status: None   Collection Time    01/11/14 10:42 AM      Result Value Ref Range   WBC 8.1  4.0 - 10.5 K/uL   RBC 4.22  3.87 - 5.11 MIL/uL   Hemoglobin 13.7  12.0 - 15.0 g/dL   HCT 39.8  36.0 - 46.0 %   MCV 94.3  78.0 - 100.0 fL   MCH 32.5  26.0 - 34.0 pg   MCHC 34.4  30.0 - 36.0 g/dL   RDW 15.0  11.5 - 15.5 %   Platelets 359  150 - 400 K/uL  DIFFERENTIAL     Status: None   Collection Time    01/11/14 10:42 AM      Result Value Ref Range   Neutrophils Relative % 56  43 - 77 %   Neutro Abs 4.5  1.7 - 7.7 K/uL   Lymphocytes Relative 36  12 - 46 %   Lymphs Abs 2.9  0.7 - 4.0 K/uL   Monocytes Relative 6  3 - 12 %   Monocytes Absolute 0.5  0.1 - 1.0 K/uL   Eosinophils Relative 2  0 - 5 %   Eosinophils Absolute 0.2  0.0 - 0.7 K/uL   Basophils Relative 0  0 - 1 %   Basophils Absolute 0.0  0.0 - 0.1 K/uL  I-STAT CHEM 8, ED     Status: Abnormal   Collection Time    01/11/14 11:02 AM      Result Value Ref Range   Sodium 142  137 - 147 mEq/L   Potassium 3.4 (*) 3.7 - 5.3 mEq/L   Chloride 104  96 - 112 mEq/L   BUN 25 (*) 6 - 23 mg/dL   Creatinine, Ser 1.00  0.50 - 1.10 mg/dL   Glucose, Bld 101 (*) 70 - 99 mg/dL   Calcium, Ion 1.15  1.13 - 1.30 mmol/L   TCO2 26  0 - 100 mmol/L   Hemoglobin 13.6  12.0 - 15.0 g/dL   HCT 40.0  36.0 - 46.0 %   No results found.   Assessment: 68 y.o. female with probable TIA involving left MCA territory, most likely resulting from underlying atrial fibrillation. Symptoms had resolved, patient is on xarelto, and thus treatment with thrombolysis was not considered.  Recommend admission to medicine and complete TIA work up. Continue xarelto. Stroke  team will resume care in am.  Stroke Risk Factors -  HTN, atrial fibrillation, stroke   Plan: 1. HgbA1c, fasting lipid panel 2. MRI, MRA  of the brain without contrast 3. Echocardiogram 4. Carotid dopplers 5. Prophylactic therapy-xarelto 6. Risk factor modification 7. Telemetry monitoring 8. Frequent neuro checks 9. PT/OT SLP (  no need as patient has no focal abnormalities on exam).  Dorian Pod, MD [facility] [phone]  01/11/2014, 11:10 AM

## 2014-01-11 NOTE — ED Notes (Signed)
Deleted charting on incorrect pt

## 2014-01-11 NOTE — Code Documentation (Signed)
68 yo female with h/o a fib on xarelto (last dose yesterday), and stroke in 2000, with no residual deficits.  This morning was in her usual state of health when at 0945 she dropped her comb x 2, and then realized she had R facial droop and diff speaking.  She called a friend who brought her to the ED, arriving at 1033.  Code stroke called in triage at 1043 and pt was taken to trauma C where labs were drawn and sx were noted to have cleared. To CT which showed not acute abnormality. Returned to Trauma C...NIHSS is 0.  Pt aware of need to be admitted for stroke risk factor management. She is agreeable to the plan. Handoff done with ED RN. Since sx have cleared, the clock can be reset.  Pt also aware of need to alert staff if there are any functional changes.

## 2014-01-11 NOTE — ED Notes (Signed)
MD at bedside. 

## 2014-01-11 NOTE — ED Provider Notes (Signed)
CSN: [id number]     Arrival date & time 01/11/14  1033 History   First MD Initiated Contact with Patient 01/11/14 1037     Chief Complaint  Patient presents with  . Aphasia     (Consider location/radiation/quality/duration/timing/severity/associated sxs/prior Treatment) The history is provided by the patient.  pt states hx cva, afib, on xarelto, awoke at 7 am today, felt fine/normal/at baseline.  Was at home alone, began attending to her normal adls.  States at approximately 8 or 830 felt funny, states was dropping her brush w right hand, ?right hand weakness. States then tried to speak to self, and words were coming out jumbled. States she could think of what she wanted to say, but would come out slurred or jumbled. Denies headache or any pain. No change in vision. No fall or imbalance. States remote, prior stroke affected her right side.     Past Medical History  Diagnosis Date  . Stroke 2001  . Hypercholesteremia   . Hypertension     mild  . Obesity   . Paroxysmal atrial fibrillation     managed with anticoagulation and rate control.    Past Surgical History  Procedure Laterality Date  . Cardiac catheterization  03/31/2000    EF 49%/LAD lg vessel which coursed to the apex & gave rise to 2 diagonal branches/ LAD noted to have 30% ostial lesion & tapers to a sm vessel toward the apex but no high grade lesion/1st diagonal med sized vessel & 2nd diagonal sm vessel with no significant disease/ lt ventriculogram reveals mild global hypokinesis w/ an estimated EF of 45-50%   Family History  Problem Relation Age of Onset  . Heart disease Father   . Asthma Father   . Diabetes Father   . Heart disease Mother   . Heart disease Brother   . Hyperlipidemia Sister   . Hyperlipidemia Brother   . Hyperlipidemia Brother    History  Substance Use Topics  . Smoking status: Former Smoker -- 0.50 packs/day for 20 years    Types: Cigarettes    Quit date: 10/18/1978  . Smokeless tobacco:  Never Used  . Alcohol Use: Yes   OB History   Grav Para Term Preterm Abortions TAB SAB Ect Mult Living                 Review of Systems  Constitutional: Negative for fever and chills.  HENT: Negative for sore throat.   Eyes: Negative for redness.  Respiratory: Negative for shortness of breath.   Cardiovascular: Negative for chest pain.  Gastrointestinal: Negative for vomiting and abdominal pain.  Genitourinary: Negative for flank pain.  Musculoskeletal: Negative for back pain and neck pain.  Skin: Negative for rash.  Neurological: Positive for weakness. Negative for headaches.  Hematological: Does not bruise/bleed easily.  Psychiatric/Behavioral: Negative for confusion.      Allergies  Sulfonamide derivatives  Home Medications   Current Outpatient Rx  Name  Route  Sig  Dispense  Refill  . acetaminophen (TYLENOL) 500 MG tablet   Oral   Take 1,000 mg by mouth as needed.           [name] Kitchen azelastine (ASTELIN) 137 MCG/SPRAY nasal spray      as needed. prn         . fenofibrate 160 MG tablet   Oral   Take 1 tablet (160 mg total) by mouth daily.   30 tablet   12   . fenofibrate 160 MG tablet  TAKE ONE TABLET BY MOUTH DAILY.   30 tablet   9   . flecainide (TAMBOCOR) 100 MG tablet   Oral   Take 1 tablet (100 mg total) by mouth 2 (two) times daily.   60 tablet   5   . fluticasone (FLONASE) 50 MCG/ACT nasal spray      as needed. prn         . pravastatin (PRAVACHOL) 20 MG tablet      Take one tablet by mouth one time daily   90 tablet   1   . ramipril (ALTACE) 2.5 MG capsule   Oral   Take 2.5 mg by mouth 3 (three) times daily. Take 2 caps in the AM and 1 cap in the PM         . ramipril (ALTACE) 2.5 MG capsule      TAKE TWO CAPSULES BY MOUTH IN THE MORNING AND  1  CAPSULE  IN  THE  EVENING   270 capsule   3   . XARELTO 20 MG TABS tablet      Take one tablet by mouth one time daily   30 tablet   4    BP 148/83  Pulse 96  Temp(Src)  97.8 F (36.6 C) (Oral)  Resp 21  Ht 5\' 6"  (1.676 m)  Wt 175 lb (79.379 kg)  BMI 28.26 kg/m2  SpO2 100% Physical Exam  Nursing note and vitals reviewed. Constitutional: She is oriented to person, place, and time. She appears well-developed and well-nourished. No distress.  HENT:  Head: Atraumatic.  Mouth/Throat: Oropharynx is clear and moist.  Eyes: Conjunctivae and EOM are normal. Pupils are equal, round, and reactive to light. No scleral icterus.  Neck: Neck supple. No tracheal deviation present.  No bruit  Cardiovascular: Normal rate, normal heart sounds and intact distal pulses.   Irregular rhythm  Pulmonary/Chest: Effort normal and breath sounds normal. No respiratory distress.  Abdominal: Soft. Normal appearance. She exhibits no distension. There is no tenderness.  Musculoskeletal: She exhibits no edema and no tenderness.  Neurological: She is alert and oriented to person, place, and time. No cranial nerve deficit.  Alert, oriented, speech clear/fluent. No pronator drift. Motor intact bil, bil UE 5/5, bil LE 5/5. sens grossly intact.   Skin: Skin is warm and dry. No rash noted. She is not diaphoretic.  Psychiatric:  anxious    ED Course  Procedures (including critical care time)   Results for orders placed during the hospital encounter of 01/11/14  ETHANOL      Result Value Ref Range   Alcohol, Ethyl (B) <11  0 - 11 mg/dL  PROTIME-INR      Result Value Ref Range   Prothrombin Time 13.3  11.6 - 15.2 seconds   INR 1.03  0.00 - 1.49  APTT      Result Value Ref Range   aPTT 30  24 - 37 seconds  CBC      Result Value Ref Range   WBC 8.1  4.0 - 10.5 K/uL   RBC 4.22  3.87 - 5.11 MIL/uL   Hemoglobin 13.7  12.0 - 15.0 g/dL   HCT 39.8  36.0 - 46.0 %   MCV 94.3  78.0 - 100.0 fL   MCH 32.5  26.0 - 34.0 pg   MCHC 34.4  30.0 - 36.0 g/dL   RDW 15.0  11.5 - 15.5 %   Platelets 359  150 - 400 K/uL  DIFFERENTIAL  Result Value Ref Range   Neutrophils Relative % 56  43 -  77 %   Neutro Abs 4.5  1.7 - 7.7 K/uL   Lymphocytes Relative 36  12 - 46 %   Lymphs Abs 2.9  0.7 - 4.0 K/uL   Monocytes Relative 6  3 - 12 %   Monocytes Absolute 0.5  0.1 - 1.0 K/uL   Eosinophils Relative 2  0 - 5 %   Eosinophils Absolute 0.2  0.0 - 0.7 K/uL   Basophils Relative 0  0 - 1 %   Basophils Absolute 0.0  0.0 - 0.1 K/uL  COMPREHENSIVE METABOLIC PANEL      Result Value Ref Range   Sodium 142  137 - 147 mEq/L   Potassium 3.6 (*) 3.7 - 5.3 mEq/L   Chloride 102  96 - 112 mEq/L   CO2 26  19 - 32 mEq/L   Glucose, Bld 104 (*) 70 - 99 mg/dL   BUN 26 (*) 6 - 23 mg/dL   Creatinine, Ser 0.88  0.50 - 1.10 mg/dL   Calcium 9.0  8.4 - 10.5 mg/dL   Total Protein 7.2  6.0 - 8.3 g/dL   Albumin 3.8  3.5 - 5.2 g/dL   AST 13  0 - 37 U/L   ALT 12  0 - 35 U/L   Alkaline Phosphatase 43  39 - 117 U/L   Total Bilirubin 0.5  0.3 - 1.2 mg/dL   GFR calc non Af Amer 66 (*) >90 mL/min   GFR calc Af Amer 76 (*) >90 mL/min  I-STAT CHEM 8, ED      Result Value Ref Range   Sodium 142  137 - 147 mEq/L   Potassium 3.4 (*) 3.7 - 5.3 mEq/L   Chloride 104  96 - 112 mEq/L   BUN 25 (*) 6 - 23 mg/dL   Creatinine, Ser 1.00  0.50 - 1.10 mg/dL   Glucose, Bld 101 (*) 70 - 99 mg/dL   Calcium, Ion 1.15  1.13 - 1.30 mmol/L   TCO2 26  0 - 100 mmol/L   Hemoglobin 13.6  12.0 - 15.0 g/dL   HCT 40.0  36.0 - 46.0 %  I-STAT TROPOININ, ED      Result Value Ref Range   Troponin i, poc 0.01  0.00 - 0.08 ng/mL   Comment 3            Ct Head Wo Contrast  01/11/2014   CLINICAL DATA:  Code stroke.  Right-sided weakness.  Dysarthria.  EXAM: CT HEAD WITHOUT CONTRAST  TECHNIQUE: Contiguous axial images were obtained from the base of the skull through the vertex without intravenous contrast.  COMPARISON:  None.  FINDINGS: Ventricle size is normal. Negative for acute or chronic infarction. Negative for hemorrhage or fluid collection. Negative for mass or edema. No shift of the midline structures.  Calvarium is intact.   IMPRESSION: Negative CT of the head  Critical Value/emergent results were called by telephone at the time of interpretation on 01/11/2014 at 11:07 AM to Dr. Lajean Saver , who verbally acknowledged these results.   Electronically Signed   By: Franchot Gallo M.D.   On: 01/11/2014 11:07      EKG Interpretation   Date/Time:  Friday January 11 2014 10:42:03 EDT Ventricular Rate:  108 PR Interval:    QRS Duration: 99 QT Interval:  324 QTC Calculation: 434 R Axis:   63 Text Interpretation:  Atrial fibrillation Non-specific ST-t changes `hx  paroxysmal afib, no acute st/t changes compared to prior ecg Confirmed by  [facility]  MD, Tharon Kitch ([id number]) on 01/11/2014 10:48:47 AM      MDM  Iv ns. Labs. Ct.  Reviewed nursing notes and prior charts for additional history.   Ct neg acute.  Neurology has seen in ED and recommends admit to med service for tia/cva workup.   pcp service called.  Recheck pt, no aphasia, no weakness.       Mirna Mires, MD 01/11/14 1226

## 2014-01-11 NOTE — ED Notes (Signed)
Pt here from home with c/o asaphia lsn 1000 . Pt has history of stoke in 2000

## 2014-01-11 NOTE — ED Notes (Signed)
Dr.Camilo at bedside  

## 2014-01-11 NOTE — Progress Notes (Signed)
Hypoglycemic Event  CBG: 68  Treatment: 15 GM carbohydrate snack  Symptoms: Pale and None  Follow-up CBG: [id number] CBG Result:  Possible Reasons for Event: Inadequate meal intake  Comments/MD notified:Gherghe    White, Nevae Pinnix P  Remember to initiate Hypoglycemia Order Set & complete

## 2014-01-12 DIAGNOSIS — I4891 Unspecified atrial fibrillation: Secondary | ICD-10-CM

## 2014-01-12 DIAGNOSIS — M6281 Muscle weakness (generalized): Secondary | ICD-10-CM

## 2014-01-12 DIAGNOSIS — I517 Cardiomegaly: Secondary | ICD-10-CM

## 2014-01-12 DIAGNOSIS — I639 Cerebral infarction, unspecified: Secondary | ICD-10-CM | POA: Diagnosis present

## 2014-01-12 DIAGNOSIS — I6350 Cerebral infarction due to unspecified occlusion or stenosis of unspecified cerebral artery: Principal | ICD-10-CM

## 2014-01-12 LAB — GLUCOSE, CAPILLARY
GLUCOSE-CAPILLARY: 88 mg/dL (ref 70–99)
Glucose-Capillary: 97 mg/dL (ref 70–99)
Glucose-Capillary: 81 mg/dL (ref 70–99)

## 2014-01-12 LAB — LIPID PANEL
CHOLESTEROL: 180 mg/dL (ref 0–200)
HDL: 60 mg/dL (ref >39)
LDL Cholesterol: 103 mg/dL — ABNORMAL HIGH (ref 0–99)
TRIGLYCERIDES: 86 mg/dL (ref <150)
Total CHOL/HDL Ratio: 3 RATIO
VLDL: 17 mg/dL (ref 0–40)

## 2014-01-12 MED ORDER — INFLUENZA VAC SPLIT QUAD 0.5 ML IM SUSP: 0.5000 mL | INTRAMUSCULAR | Status: DC

## 2014-01-12 MED ORDER — POTASSIUM CHLORIDE CRYS ER 20 MEQ PO TBCR
40.0000 meq | EXTENDED_RELEASE_TABLET | Freq: Once | ORAL | Status: AC
  Administered 2014-01-12: 40 meq via ORAL
  Filled 2014-01-12: qty 2

## 2014-01-12 MED ORDER — PNEUMOCOCCAL VAC POLYVALENT 25 MCG/0.5ML IJ INJ: 0.5000 mL | INJECTION | INTRAMUSCULAR | Status: DC

## 2014-01-12 NOTE — Progress Notes (Signed)
Stroke Team Progress Note  HISTORY Alexandra Pierce is a 68 y.o. female, right handed, with a past medical history significant for HTN, atrial fibrillation on xarelto, stroke in 2000 without residual deficits, brought in by her husband due to acute onset of aphasia, right hemiparesis, and right face weakness. She was last known well at 945 am 01/11/2014. She said that around that time she was home and dropped her comb twice, then looked to the mirror and her face was not right. Talked to her husband on the phone at approximately 10 am and was noted to have difficulty expressing herself.  Alexandra Pierce indicated that the whole episode of difficulty talking lasted 20 minutes.  According to the ER nursing staff, by the time she reached the bridge she was already speaking well.  Initial NIHSS 0. CT brain showed no acute abnormality.  Denies HA, vertigo, double vision, difficulty swallowing, confusion, imbalance, focal numbness, or visual disturbances.   Date last known well: 01/11/14  Time last known well: 945 am  tPA Given: no, symptoms resolved  NIHSS: 0  MRS:    SUBJECTIVE The patient has a friend in the room with her. She feels she is back to baseline except for some word finding difficulties. She admits to missing some doses of her Xarelto. She is anxious for discharge.  OBJECTIVE Most recent Vital Signs: Filed Vitals:   01/11/14 1727 01/11/14 2200 01/12/14 0200 01/12/14 0549  BP: 109/70 113/71 127/78 114/71  Pulse: 62 63 64 63  Temp: 98.4 F (36.9 C) 98.3 F (36.8 C) 98.6 F (37 C) 97.8 F (36.6 C)  TempSrc: Oral Oral Oral Oral  Resp: 20 20 20 20   Height: 5' 6.25" (1.683 m)     Weight: 181 lb 7 oz (82.3 kg)     SpO2: 98% 97% 98% 96%   CBG (last 3)   Recent Labs  01/11/14 1859 01/11/14 2155 01/12/14 0648  GLUCAP 91 119* 97    IV Fluid Intake:     MEDICATIONS  . fenofibrate  160 mg Oral Daily  . flecainide  100 mg Oral BID  . [START ON 01/13/2014] pneumococcal 23 valent  vaccine  0.5 mL Intramuscular Tomorrow-1000  . potassium chloride  40 mEq Oral Once  . rivaroxaban  20 mg Oral Q supper  . simvastatin  20 mg Oral q1800   PRN:    Diet:  Cardiac thin liquids Activity:  Bathroom privileges with assistance. DVT Prophylaxis:  Xarelto  CLINICALLY SIGNIFICANT STUDIES Basic Metabolic Panel:  Recent Labs Lab 01/11/14 1042 01/11/14 1102  NA 142 142  K 3.6* 3.4*  CL 102 104  CO2 26  --   GLUCOSE 104* 101*  BUN 26* 25*  CREATININE 0.88 1.00  CALCIUM 9.0  --    Liver Function Tests:  Recent Labs Lab 01/11/14 1042  AST 13  ALT 12  ALKPHOS 43  BILITOT 0.5  PROT 7.2  ALBUMIN 3.8   CBC:  Recent Labs Lab 01/11/14 1042 01/11/14 1102  WBC 8.1  --   NEUTROABS 4.5  --   HGB 13.7 13.6  HCT 39.8 40.0  MCV 94.3  --   PLT 359  --    Coagulation:  Recent Labs Lab 01/11/14 1042  LABPROT 13.3  INR 1.03   Cardiac Enzymes: No results found for this basename: CKTOTAL, CKMB, CKMBINDEX, TROPONINI,  in the last 168 hours Urinalysis:  Recent Labs Lab 01/11/14 [place] 1.009  PHURINE 7.0  GLUCOSEU  NEGATIVE  HGBUR NEGATIVE  BILIRUBINUR NEGATIVE  KETONESUR NEGATIVE  PROTEINUR NEGATIVE  UROBILINOGEN 0.2  NITRITE NEGATIVE  LEUKOCYTESUR NEGATIVE   Lipid Panel    Component Value Date/Time   CHOL 180 01/12/2014 0113   TRIG 86 01/12/2014 0113   HDL 60 01/12/2014 0113   CHOLHDL 3.0 01/12/2014 0113   VLDL 17 01/12/2014 0113   LDLCALC 103* 01/12/2014 0113   HgbA1C  Lab Results  Component Value Date   HGBA1C 5.6 01/11/2014    Urine Drug Screen:     Component Value Date/Time   LABOPIA NONE DETECTED 01/11/2014 1200   COCAINSCRNUR NONE DETECTED 01/11/2014 1200   LABBENZ NONE DETECTED 01/11/2014 1200   AMPHETMU NONE DETECTED 01/11/2014 1200   THCU NONE DETECTED 01/11/2014 1200   LABBARB NONE DETECTED 01/11/2014 1200    Alcohol Level:  Recent Labs Lab 01/11/14 1042  ETH <11    Ct Head Wo Contrast 01/11/2014    Negative  CT of the head     Mr Jodene Nam Head Wo Contrast 01/11/2014    Multifocal areas of acute infarction affecting the left hemisphere of apparent differing ages. The most acute abnormality is located more anteriorly without hemorrhage. In the setting of atrial fibrillation these abnormalities are consistent with multiple emboli at different times.  Mild atrophy and small vessel disease with evidence for prior remote right hemisphere ischemia.    MRA No proximal flow reducing lesion on MRA.     2D Echocardiogram  pending  Carotid Doppler  pending  CXR    EKG - atrial fibrillation with ventricular response 108 beats per minute.   For complete results please see formal report.   Therapy Recommendations - pending  Physical Exam    Mental Status:  Alert, oriented, thought content appropriate. No dysarthria noted. At times speech is somewhat delayed. Able to follow 3 step commands without difficulty.  Cranial Nerves:  II: Discs flat bilaterally; Visual fields grossly normal, pupils equal, round, reactive to light and accommodation  III,IV, VI: ptosis not present, extra-ocular motions intact bilaterally  V,VII: Very slight left lower facial droop, facial light touch sensation normal bilaterally  VIII: hearing normal bilaterally  IX,X: gag reflex present  XI: bilateral shoulder shrug  XII: midline tongue extension without atrophy or fasciculations Motor:  Right : Upper extremity 5/5 Left: Upper extremity 5/5  Lower extremity 5/5 Lower extremity 5/5  Tone and bulk:normal tone throughout; no atrophy noted  Sensory: Pinprick and light touch intact throughout, bilaterally  Deep Tendon Reflexes:  Right: Upper Extremity Left: Upper extremity  biceps (C-5 to C-6) 2/4 biceps (C-5 to C-6) 2/4  tricep (C7) 2/4 triceps (C7) 2/4  Brachioradialis (C6) 2/4 Brachioradialis (C6) 2/4  Lower Extremity Lower Extremity  quadriceps (L-2 to L-4) 2/4 quadriceps (L-2 to L-4) 2/4  Achilles (S1) 2/4 Achilles (S1) 2/4   Plantars:  Right: downgoing Left: downgoing  Cerebellar:  normal finger-to-nose, normal heel-to-shin test  Gait:  Not tested.   ASSESSMENT Alexandra Pierce is a 68 y.o. female presenting with aphasia, right hemiparesis, and right facial weakness. The patient did not receive TPA therapy as her deficits had resolved and she is on Xarelto. MRI - multifocal areas of acute infarction affecting the left hemisphere of apparent differing ages. Infarcts felt to be embolic secondary to atrial fibrillation. Xarelto prior to admission. Now on Xarelto for secondary stroke prevention. Patient with resultant resolution of deficits. Stroke work up underway.   Atrial fibrillation - Xarelto and flecainide  Hyperlipidemia -  Cholesterol 180 LDL 103 - Pravachol prior to admission  Hypokalemia  Elevated BUN  Hypertension history  - Mildly low blood pressures  Previous stroke  Obesity  Hemoglobin A1c 5.6   Hospital day # 1  TREATMENT/PLAN  Continue Xarelto for secondary stroke prevention. Cautioned patient not to miss any more doses.  No PT/OT indication as exam unremarkable. Can refer for outpatient Speech therapy  Carotid doppler unremarkable. Awaiting 2D echo  Risk factor modification  Follow up with Dr Leonie Man at [place] in 2 months  If 2D echo completed, no further neurological workup indicated at this time   Mikey Bussing PA-C [facility] Pager [phone] 01/12/2014, 8:16 AM  I have personally obtained a history, examined the patient, evaluated imaging results, and formulated the assessment and plan of care. I agree with the above.  Jim Like, D.O.  Neurology-Stroke   To contact Stroke Continuity provider, please refer to [email or web address] After hours, contact General Neurology

## 2014-01-12 NOTE — Progress Notes (Signed)
  Echocardiogram 2D Echocardiogram has been performed.  Alexandra Pierce 01/12/2014, 5:10 PM

## 2014-01-12 NOTE — Discharge Summary (Signed)
Physician Discharge Summary  Alexandra Pierce UUV:[id number] DOB: 1946/10/12 DOA: 01/11/2014  PCP: Cain Saupe, MD  Admit date: 01/11/2014 Discharge date: 01/12/2014  Time spent: 35 minutes  Recommendations for Outpatient Follow-up:  1. Follow up on patient's anticoagulation, she reported missing several doses of Xarelto, now presenting with multifocal areas of infarct.   Discharge Diagnoses:  Principal Problem:   Acute CVA (cerebrovascular accident) Active Problems:   DYSLIPIDEMIA   HYPERTENSION   Atrial fibrillation   CEREBROVASCULAR ACCIDENT, HX OF   Discharge Condition: Stable  Diet recommendation: Heart healthy  Filed Weights   01/11/14 1044 01/11/14 1727  Weight: 79.379 kg (175 lb) 82.3 kg (181 lb 7 oz)    History of present illness:  Alexandra Pierce is a 68 y.o. female has a past medical history significant for HTN, HLD, A fib on chronic anticoagulation, presented to the ED with a chief complaint of facial asymmetry of apparently sudden onset this morning at 9:45, dysarthria and right sided weakness, lasting less than half an hour and resolved by the time she arrived in the ED. She woke up around 7 am this morning ans was normal at that time. Denies any chest pain, shortness of breath. She had no recent fever or chills. No abdominal pain, NV or D. No lightheadedness or dizziness. Felt completely normal prior to event and returned to normal on my evaluation.  She arrived in the ED where stroke code was initiated, no tPA since she is on anticoagulation. Neurology evaluated patient and will be admitted to further workup.  Hospital Course:  Patient is a pleasant 68 year old female with a past medical history of atrial fibrillation, hypertension, history of CVA, who was admitted to the medicine service on 01/11/2014. She presented with complaints of aphasia and right-sided weakness. Symptoms concerning for CVA however she was not administer TPA do to history of being on anticoagulation.  Patient admitted to neuro floor, with neurology consultation. She had an MRI of brain which showed multifocal areas of acute infarction affecting the left hemisphere of differing ages per radiology. Pattern seemed to be most consistent with embolic infarcts. Patient's head admitted to missing "several days" of her Xarelto. She was counseled extensively during this hospitalization on remaining compliant to anticoagulation therapy, as MRI findings seem most consistent with thromboembolic phenomenon. Right-sided weakness resolved by the following day. Dysarthria also improved however she was not quite at her baseline and he continued to have some issues with articulation. Neurology did not recommend further workup, recommending discharge with outpatient speech therapy. Prior to discharge I consulted case manager to set patient up with outpatient speech therapy.    Consultations:  Neurology  Discharge Exam: Filed Vitals:   01/12/14 1421  BP: 117/71  Pulse: 68  Temp: 98.2 F (36.8 C)  Resp: 20   General: Patient is awake, alert, follows commands, in no acute distress, having difficulties getting words out  Cardiovascular: Regular rate and rhythm normal S1-S2  Respiratory: Clear to auscultation bilaterally no wheezing rhonchi or rales  Abdomen: Soft nontender nondistended  Musculoskeletal: No edema  Neurologic examination. Patient having 5 out of 5 muscle strength to bilateral upper extremities and lower extremities. There is no alteration to sensation. I do not no facial droop or tongue deviation. However she continues to have some aphasia.    Discharge Instructions      Discharge Orders   Future Appointments Provider Department Dept Phone   03/29/2014 10:30 AM Rosalio Macadamia, NP [facility] [place] Office [phone]  Future Orders Complete By Expires   Call MD for:  difficulty breathing, headache or visual disturbances  As directed    Call MD for:  extreme fatigue  As directed     Call MD for:  persistant dizziness or light-headedness  As directed    Call MD for:  persistant nausea and vomiting  As directed    Call MD for:  severe uncontrolled pain  As directed    Diet - low sodium heart healthy  As directed    Increase activity slowly  As directed        Medication List    STOP taking these medications       PROBIOTIC DAILY PO      TAKE these medications       acetaminophen 500 MG tablet  Commonly known as:  TYLENOL  Take 1,000 mg by mouth as needed.     cholecalciferol 1000 UNITS tablet  Commonly known as:  VITAMIN D  Take 1,000 Units by mouth daily.     fenofibrate 160 MG tablet  Take 1 tablet (160 mg total) by mouth daily.     pravastatin 20 MG tablet  Commonly known as:  PRAVACHOL  Take one tablet by mouth one time daily     ramipril 2.5 MG capsule  Commonly known as:  ALTACE  Take 2.5 mg by mouth 2 (two) times daily. Take 2 caps in the AM and 1 cap in the PM     TAMBOCOR 100 MG tablet  Generic drug:  flecainide  Take 100 mg by mouth 2 (two) times daily.     XARELTO 20 MG Tabs tablet  Generic drug:  Rivaroxaban  Take one tablet by mouth one time daily       Allergies  Allergen Reactions  . Sulfonamide Derivatives    Follow-up Information   Follow up with SETHI,PRAMODKUMAR P, MD In 1 month.   Specialties:  Neurology, Radiology   Contact information:   [address] [place] [address] [phone]       Follow up with FULP, CAMMIE, MD In 2 weeks.   Specialty:  Family Medicine   Contact information:   [address] [place] [address] [phone]        The results of significant diagnostics from this hospitalization (including imaging, microbiology, ancillary and laboratory) are listed below for reference.    Significant Diagnostic Studies: Ct Head Wo Contrast  01/11/2014   CLINICAL DATA:  Code stroke.  Right-sided weakness.  Dysarthria.  EXAM: CT HEAD WITHOUT CONTRAST  TECHNIQUE:  Contiguous axial images were obtained from the base of the skull through the vertex without intravenous contrast.  COMPARISON:  None.  FINDINGS: Ventricle size is normal. Negative for acute or chronic infarction. Negative for hemorrhage or fluid collection. Negative for mass or edema. No shift of the midline structures.  Calvarium is intact.  IMPRESSION: Negative CT of the head  Critical Value/emergent results were called by telephone at the time of interpretation on 01/11/2014 at 11:07 AM to Dr. Cathren Laine , who verbally acknowledged these results.   Electronically Signed   By: Marlan Palau M.D.   On: 01/11/2014 11:07   Mr Maxine Glenn Head Wo Contrast  01/11/2014   CLINICAL DATA:  Transient speech difficulty with right arm weakness, now resolved. Patient currently anticoagulated. Stroke risk factors include atrial fibrillation, hypertension, and hypercholesterolemia as well as previous stroke.  EXAM: MRI HEAD WITHOUT CONTRAST  MRA HEAD WITHOUT CONTRAST  TECHNIQUE: Multiplanar, multiecho pulse sequences of the brain and surrounding structures were obtained without intravenous contrast. Angiographic images of the head were obtained using MRA technique without contrast.  COMPARISON:  CT HEAD W/O CM dated 01/11/2014  FINDINGS: MRI HEAD FINDINGS  Three separate areas of restricted diffusion are present in the left hemisphere. The most acute (most hyperintense on DWI) involves the left anterior frontal cortex. Separated by 2-3 cm are two other areas of lower level restricted diffusion in the posterior frontal cortex, one involving the subcortical white matter, and another involving the precentral gyrus. None are associated with hemorrhage.  Mild cerebral and cerebellar atrophy. Mild subcortical and periventricular T2 and FLAIR hyperintensities, likely chronic microvascular ischemic change. Remote right frontal cortical infarct. Flow voids are maintained throughout the carotid, basilar, and vertebral arteries. There are no  areas of chronic hemorrhage. Pituitary, pineal, and cerebellar tonsils unremarkable. No upper cervical lesions. Visualized calvarium, skull base, and upper cervical osseous structures unremarkable. Scalp and extracranial soft tissues, orbits, sinuses, and mastoids show no acute process.  Compared with prior CT, the infarctions are not visible.  MRA HEAD FINDINGS  Internal carotid arteries are widely patent. The basilar artery is widely patent with left vertebral sole contributor. No visible proximal intracranial stenosis or aneurysm.  In the distal left MCA territory, there could be a truncated left M3/M4 (opercular/cortical) branch. This is an equivocal finding. There is mild irregularity distal MCA and PCA branches bilaterally suggesting intracranial atherosclerotic disease.  IMPRESSION: Multifocal areas of acute infarction affecting the left hemisphere of apparent differing ages. The most acute abnormality is located more anteriorly without hemorrhage. In the setting of atrial fibrillation these abnormalities are consistent with multiple emboli at different times.  Mild atrophy and small vessel disease with evidence for prior remote right hemisphere ischemia.  No proximal flow reducing lesion on MRA.  See discussion above.  Findings discussed with ordering provider.   Electronically Signed   By: Davonna Belling M.D.   On: 01/11/2014 13:59   Mr Brain Wo Contrast  01/11/2014   CLINICAL DATA:  Transient speech difficulty with right arm weakness, now resolved. Patient currently anticoagulated. Stroke risk factors include atrial fibrillation, hypertension, and hypercholesterolemia as well as previous stroke.  EXAM: MRI HEAD WITHOUT CONTRAST  MRA HEAD WITHOUT CONTRAST  TECHNIQUE: Multiplanar, multiecho pulse sequences of the brain and surrounding structures were obtained without intravenous contrast. Angiographic images of the head were obtained using MRA technique without contrast.  COMPARISON:  CT HEAD W/O CM dated  01/11/2014  FINDINGS: MRI HEAD FINDINGS  Three separate areas of restricted diffusion are present in the left hemisphere. The most acute (most hyperintense on DWI) involves the left anterior frontal cortex. Separated by 2-3 cm are two other areas of lower level restricted diffusion in the posterior frontal cortex, one involving the subcortical white matter, and another involving the precentral gyrus. None are associated with hemorrhage.  Mild cerebral and cerebellar atrophy. Mild subcortical and periventricular T2 and FLAIR hyperintensities, likely chronic microvascular ischemic change. Remote right frontal cortical infarct. Flow voids are maintained throughout the carotid, basilar, and vertebral arteries. There are no areas of chronic hemorrhage. Pituitary, pineal, and cerebellar tonsils unremarkable. No upper cervical lesions. Visualized calvarium, skull base, and upper cervical osseous structures unremarkable. Scalp and extracranial soft tissues, orbits, sinuses, and mastoids show no acute process.  Compared with prior CT, the infarctions are not visible.  MRA HEAD FINDINGS  Internal carotid arteries are widely patent. The basilar artery is widely patent  with left vertebral sole contributor. No visible proximal intracranial stenosis or aneurysm.  In the distal left MCA territory, there could be a truncated left M3/M4 (opercular/cortical) branch. This is an equivocal finding. There is mild irregularity distal MCA and PCA branches bilaterally suggesting intracranial atherosclerotic disease.  IMPRESSION: Multifocal areas of acute infarction affecting the left hemisphere of apparent differing ages. The most acute abnormality is located more anteriorly without hemorrhage. In the setting of atrial fibrillation these abnormalities are consistent with multiple emboli at different times.  Mild atrophy and small vessel disease with evidence for prior remote right hemisphere ischemia.  No proximal flow reducing lesion on  MRA.  See discussion above.  Findings discussed with ordering provider.   Electronically Signed   By: Davonna Belling M.D.   On: 01/11/2014 13:59    Microbiology: No results found for this or any previous visit (from the past 240 hour(s)).   Labs: Basic Metabolic Panel:  Recent Labs Lab 01/11/14 1042 01/11/14 1102  NA 142 142  K 3.6* 3.4*  CL 102 104  CO2 26  --   GLUCOSE 104* 101*  BUN 26* 25*  CREATININE 0.88 1.00  CALCIUM 9.0  --    Liver Function Tests:  Recent Labs Lab 01/11/14 1042  AST 13  ALT 12  ALKPHOS 43  BILITOT 0.5  PROT 7.2  ALBUMIN 3.8   No results found for this basename: LIPASE, AMYLASE,  in the last 168 hours No results found for this basename: AMMONIA,  in the last 168 hours CBC:  Recent Labs Lab 01/11/14 1042 01/11/14 1102  WBC 8.1  --   NEUTROABS 4.5  --   HGB 13.7 13.6  HCT 39.8 40.0  MCV 94.3  --   PLT 359  --    Cardiac Enzymes: No results found for this basename: CKTOTAL, CKMB, CKMBINDEX, TROPONINI,  in the last 168 hours BNP: BNP (last 3 results) No results found for this basename: PROBNP,  in the last 8760 hours CBG:  Recent Labs Lab 01/11/14 1820 01/11/14 1859 01/11/14 2155 01/12/14 0648 01/12/14 1129  GLUCAP 67* 91 119* 97 88       Signed:  Tyia Binford  [facility] 01/12/2014, 4:16 PM

## 2014-01-12 NOTE — Progress Notes (Signed)
VASCULAR LAB PRELIMINARY  PRELIMINARY  PRELIMINARY  PRELIMINARY  Carotid Dopplers completed.    Preliminary report:  1-39% ICA stenosis.  Vertebral artery flow is antegrade.  Alexandra Pierce, RVT 01/12/2014, 10:04 AM

## 2014-01-12 NOTE — Progress Notes (Signed)
[facility] PROGRESS NOTE  Alexandra Pierce ZOX:[id number] DOB: 17-Jan-1946 DOA: 01/11/2014 PCP: Antony Blackbird, MD  Assessment/Plan: 1. Acute CVA. Patient with history of atrial fibrillation, presenting with right-sided weakness and aphasia. Patient reporting improvement to right-sided weakness however continues to have some aphasia. MRI of brain showed multifocal areas of acute infarction. She had been on anticoagulation with Xarelto however admitted to "skipping several doses" stroke team has been consulted. Patient undergoing CVA workup, will follow. 2. Dyslipidemia. Lab work showing a LDL of 103, continue statin therapy 3. Hypertension. Patient blood pressures are stable, antihypertensive agents held 4. Atrial fibrillation. Patient is currently in sinus rhythm, will continue flecainide and anticoagulation with Xarelto.   Code Status: Full code Family Communication: Spoke with family members present at bedside Disposition Plan: Patient undergoing CVA workup   Consultants:  Neurology   HPI/Subjective: Patient is a pleasant 68 year old female with a past medical history of atrial fibrillation, previously on Xarelto therapy who was admitted to the medicine service on 01/11/2014 as she presented with right-sided weakness and aphasia. Patient had admitted to missing "a few days" of Xarelto. An MRI of the brain showing multifocal areas of acute infarction affecting the left hemisphere. Patient was seen and evaluated by the stroke team this morning. This morning she reports improvement to extremity weakness however continues to have difficulties getting words out. Otherwise she is awake alert oriented, denies new focal neurological deficits.  Objective: Filed Vitals:   01/12/14 0952  BP: 111/52  Pulse: 64  Temp: 98.1 F (36.7 C)  Resp: 20    Intake/Output Summary (Last 24 hours) at 01/12/14 1013 Last data filed at 01/12/14 0900  Gross per 24 hour  Intake    360 ml  Output      0 ml   Net    360 ml   Filed Weights   01/11/14 1044 01/11/14 1727  Weight: 79.379 kg (175 lb) 82.3 kg (181 lb 7 oz)    Exam:   General:  Patient is awake, alert, follows commands, in no acute distress, having difficulties getting words out  Cardiovascular: Regular rate and rhythm normal S1-S2  Respiratory: Clear to auscultation bilaterally no wheezing rhonchi or rales  Abdomen: Soft nontender nondistended  Musculoskeletal: No edema  Neurologic examination. Patient having 5 out of 5 muscle strength to bilateral upper extremities and lower extremities. There is no alteration to sensation. I do not no facial droop or tongue deviation. However she continues to have some aphasia.  Data Reviewed: Basic Metabolic Panel:  Recent Labs Lab 01/11/14 1042 01/11/14 1102  NA 142 142  K 3.6* 3.4*  CL 102 104  CO2 26  --   GLUCOSE 104* 101*  BUN 26* 25*  CREATININE 0.88 1.00  CALCIUM 9.0  --    Liver Function Tests:  Recent Labs Lab 01/11/14 1042  AST 13  ALT 12  ALKPHOS 43  BILITOT 0.5  PROT 7.2  ALBUMIN 3.8   No results found for this basename: LIPASE, AMYLASE,  in the last 168 hours No results found for this basename: AMMONIA,  in the last 168 hours CBC:  Recent Labs Lab 01/11/14 1042 01/11/14 1102  WBC 8.1  --   NEUTROABS 4.5  --   HGB 13.7 13.6  HCT 39.8 40.0  MCV 94.3  --   PLT 359  --    Cardiac Enzymes: No results found for this basename: CKTOTAL, CKMB, CKMBINDEX, TROPONINI,  in the last 168 hours BNP (last 3 results) No  results found for this basename: PROBNP,  in the last 8760 hours CBG:  Recent Labs Lab 01/11/14 1820 01/11/14 1859 01/11/14 2155 01/12/14 0648  GLUCAP 67* 91 119* 97    No results found for this or any previous visit (from the past 240 hour(s)).   Studies: Ct Head Wo Contrast  01/11/2014   CLINICAL DATA:  Code stroke.  Right-sided weakness.  Dysarthria.  EXAM: CT HEAD WITHOUT CONTRAST  TECHNIQUE: Contiguous axial images were  obtained from the base of the skull through the vertex without intravenous contrast.  COMPARISON:  None.  FINDINGS: Ventricle size is normal. Negative for acute or chronic infarction. Negative for hemorrhage or fluid collection. Negative for mass or edema. No shift of the midline structures.  Calvarium is intact.  IMPRESSION: Negative CT of the head  Critical Value/emergent results were called by telephone at the time of interpretation on 01/11/2014 at 11:07 AM to Dr. Lajean Saver , who verbally acknowledged these results.   Electronically Signed   By: Franchot Gallo M.D.   On: 01/11/2014 11:07   Mr Jodene Nam Head Wo Contrast  01/11/2014   CLINICAL DATA:  Transient speech difficulty with right arm weakness, now resolved. Patient currently anticoagulated. Stroke risk factors include atrial fibrillation, hypertension, and hypercholesterolemia as well as previous stroke.  EXAM: MRI HEAD WITHOUT CONTRAST  MRA HEAD WITHOUT CONTRAST  TECHNIQUE: Multiplanar, multiecho pulse sequences of the brain and surrounding structures were obtained without intravenous contrast. Angiographic images of the head were obtained using MRA technique without contrast.  COMPARISON:  CT HEAD W/O CM dated 01/11/2014  FINDINGS: MRI HEAD FINDINGS  Three separate areas of restricted diffusion are present in the left hemisphere. The most acute (most hyperintense on DWI) involves the left anterior frontal cortex. Separated by 2-3 cm are two other areas of lower level restricted diffusion in the posterior frontal cortex, one involving the subcortical white matter, and another involving the precentral gyrus. None are associated with hemorrhage.  Mild cerebral and cerebellar atrophy. Mild subcortical and periventricular T2 and FLAIR hyperintensities, likely chronic microvascular ischemic change. Remote right frontal cortical infarct. Flow voids are maintained throughout the carotid, basilar, and vertebral arteries. There are no areas of chronic hemorrhage.  Pituitary, pineal, and cerebellar tonsils unremarkable. No upper cervical lesions. Visualized calvarium, skull base, and upper cervical osseous structures unremarkable. Scalp and extracranial soft tissues, orbits, sinuses, and mastoids show no acute process.  Compared with prior CT, the infarctions are not visible.  MRA HEAD FINDINGS  Internal carotid arteries are widely patent. The basilar artery is widely patent with left vertebral sole contributor. No visible proximal intracranial stenosis or aneurysm.  In the distal left MCA territory, there could be a truncated left M3/M4 (opercular/cortical) branch. This is an equivocal finding. There is mild irregularity distal MCA and PCA branches bilaterally suggesting intracranial atherosclerotic disease.  IMPRESSION: Multifocal areas of acute infarction affecting the left hemisphere of apparent differing ages. The most acute abnormality is located more anteriorly without hemorrhage. In the setting of atrial fibrillation these abnormalities are consistent with multiple emboli at different times.  Mild atrophy and small vessel disease with evidence for prior remote right hemisphere ischemia.  No proximal flow reducing lesion on MRA.  See discussion above.  Findings discussed with ordering provider.   Electronically Signed   By: Rolla Flatten M.D.   On: 01/11/2014 13:59   Mr Brain Wo Contrast  01/11/2014   CLINICAL DATA:  Transient speech difficulty with right arm weakness, now resolved.  Patient currently anticoagulated. Stroke risk factors include atrial fibrillation, hypertension, and hypercholesterolemia as well as previous stroke.  EXAM: MRI HEAD WITHOUT CONTRAST  MRA HEAD WITHOUT CONTRAST  TECHNIQUE: Multiplanar, multiecho pulse sequences of the brain and surrounding structures were obtained without intravenous contrast. Angiographic images of the head were obtained using MRA technique without contrast.  COMPARISON:  CT HEAD W/O CM dated 01/11/2014  FINDINGS: MRI HEAD  FINDINGS  Three separate areas of restricted diffusion are present in the left hemisphere. The most acute (most hyperintense on DWI) involves the left anterior frontal cortex. Separated by 2-3 cm are two other areas of lower level restricted diffusion in the posterior frontal cortex, one involving the subcortical white matter, and another involving the precentral gyrus. None are associated with hemorrhage.  Mild cerebral and cerebellar atrophy. Mild subcortical and periventricular T2 and FLAIR hyperintensities, likely chronic microvascular ischemic change. Remote right frontal cortical infarct. Flow voids are maintained throughout the carotid, basilar, and vertebral arteries. There are no areas of chronic hemorrhage. Pituitary, pineal, and cerebellar tonsils unremarkable. No upper cervical lesions. Visualized calvarium, skull base, and upper cervical osseous structures unremarkable. Scalp and extracranial soft tissues, orbits, sinuses, and mastoids show no acute process.  Compared with prior CT, the infarctions are not visible.  MRA HEAD FINDINGS  Internal carotid arteries are widely patent. The basilar artery is widely patent with left vertebral sole contributor. No visible proximal intracranial stenosis or aneurysm.  In the distal left MCA territory, there could be a truncated left M3/M4 (opercular/cortical) branch. This is an equivocal finding. There is mild irregularity distal MCA and PCA branches bilaterally suggesting intracranial atherosclerotic disease.  IMPRESSION: Multifocal areas of acute infarction affecting the left hemisphere of apparent differing ages. The most acute abnormality is located more anteriorly without hemorrhage. In the setting of atrial fibrillation these abnormalities are consistent with multiple emboli at different times.  Mild atrophy and small vessel disease with evidence for prior remote right hemisphere ischemia.  No proximal flow reducing lesion on MRA.  See discussion above.   Findings discussed with ordering provider.   Electronically Signed   By: Rolla Flatten M.D.   On: 01/11/2014 13:59    Scheduled Meds: . fenofibrate  160 mg Oral Daily  . flecainide  100 mg Oral BID  . [START ON 01/13/2014] pneumococcal 23 valent vaccine  0.5 mL Intramuscular Tomorrow-1000  . potassium chloride  40 mEq Oral Once  . rivaroxaban  20 mg Oral Q supper  . simvastatin  20 mg Oral q1800   Continuous Infusions:   Principal Problem:   TIA (transient ischemic attack) Active Problems:   DYSLIPIDEMIA   HYPERTENSION   Atrial fibrillation   CEREBROVASCULAR ACCIDENT, HX OF    Time spent: 35 min    Kelvin Cellar  [facility] Pager [phone] 7PM-7AM, please contact night-coverage at [email or web address], password [email or web address] 01/12/2014, 10:13 AM  LOS: 1 day

## 2014-01-14 NOTE — Progress Notes (Signed)
UR complete.  Angeles Paolucci RN, MSN 

## 2014-03-29 ENCOUNTER — Encounter: Payer: Self-pay | Admitting: Nurse Practitioner

## 2014-03-29 ENCOUNTER — Ambulatory Visit (INDEPENDENT_AMBULATORY_CARE_PROVIDER_SITE_OTHER): Payer: Medicare Other | Admitting: Nurse Practitioner

## 2014-03-29 VITALS — BP 140/72 | HR 61 | Ht 66.0 in | Wt 178.0 lb

## 2014-03-29 DIAGNOSIS — I480 Paroxysmal atrial fibrillation: Secondary | ICD-10-CM

## 2014-03-29 DIAGNOSIS — I6350 Cerebral infarction due to unspecified occlusion or stenosis of unspecified cerebral artery: Secondary | ICD-10-CM

## 2014-03-29 DIAGNOSIS — Z7901 Long term (current) use of anticoagulants: Secondary | ICD-10-CM

## 2014-03-29 DIAGNOSIS — E785 Hyperlipidemia, unspecified: Secondary | ICD-10-CM

## 2014-03-29 DIAGNOSIS — I10 Essential (primary) hypertension: Secondary | ICD-10-CM

## 2014-03-29 DIAGNOSIS — I639 Cerebral infarction, unspecified: Secondary | ICD-10-CM

## 2014-03-29 DIAGNOSIS — I4891 Unspecified atrial fibrillation: Secondary | ICD-10-CM

## 2014-03-29 LAB — BASIC METABOLIC PANEL
BUN: 18 mg/dL (ref 6–23)
CO2: 28 mEq/L (ref 19–32)
Calcium: 9.3 mg/dL (ref 8.4–10.5)
Chloride: 105 mEq/L (ref 96–112)
Creatinine, Ser: 0.8 mg/dL (ref 0.4–1.2)
GFR: 77.75 mL/min (ref >60.00)
Glucose, Bld: 86 mg/dL (ref 70–99)
Potassium: 3.9 mEq/L (ref 3.5–5.1)
Sodium: 139 mEq/L (ref 135–145)

## 2014-03-29 MED ORDER — FLECAINIDE ACETATE 100 MG PO TABS: 100.0000 mg | ORAL_TABLET | Freq: Two times a day (BID) | ORAL | Status: DC

## 2014-03-29 MED ORDER — FENOFIBRATE 160 MG PO TABS: 160.0000 mg | ORAL_TABLET | Freq: Every day | ORAL | Status: DC

## 2014-03-29 MED ORDER — RAMIPRIL 2.5 MG PO CAPS: 2.5000 mg | ORAL_CAPSULE | Freq: Two times a day (BID) | ORAL | Status: DC

## 2014-03-29 NOTE — Progress Notes (Addendum)
Alexandra Pierce Date of Birth: 1946/09/14 Medical Record #[id number]  History of Present Illness: Alexandra Pierce is seen back today for a one year check - seen for Alexandra Pierce - former patient of Alexandra Pierce's. She has PAF, remote stroke, and on chronic anticoagulation - now on Xarelto due to labile readings with her coumadin. Other issues include HLD, HTN and obesity. She has had statin intolerance.  Prior cath in 2001 with mild CAD and EF of 45 to 50%.  Last saw me in 2013. Saw Alexandra Pierce in June of 2014 and was doing ok.  Comes back today - here alone. Hospitalized in March with recurrent stroke - multiple infarcts on MRI. Had missed several doses of her Xarelto. Was in atrial fib on arrival to the ER back in March. Tells me she had a "mild stroke" - doing ok. Not missing her medicines. Not really clear if she is aware of being in and out of rhythm. Some right sided weakness persist - does get worse if she is really fatigued. No chest pain. Understands now the need to NOT miss her Xarelto. She had been traveling and really got out of her routine.   Current Outpatient Prescriptions  Medication Sig Dispense Refill  . acetaminophen (TYLENOL) 500 MG tablet Take 1,000 mg by mouth as needed.        . cholecalciferol (VITAMIN D) 1000 UNITS tablet Take 1,000 Units by mouth daily.      . fenofibrate 160 MG tablet Take 1 tablet (160 mg total) by mouth daily.  30 tablet  12  . flecainide (TAMBOCOR) 100 MG tablet Take 100 mg by mouth 2 (two) times daily.      . pravastatin (PRAVACHOL) 20 MG tablet Take one tablet by mouth one time daily  90 tablet  1  . ramipril (ALTACE) 2.5 MG capsule Take 2.5 mg by mouth 2 (two) times daily. Take 2 caps in the AM and 1 cap in the PM      . XARELTO 20 MG TABS tablet Take one tablet by mouth one time daily  30 tablet  4   No current facility-administered medications for this visit.    Allergies  Allergen Reactions  . Sulfonamide Derivatives     Past Medical History    Diagnosis Date  . Stroke 2001  . Hypercholesteremia   . Hypertension     mild  . Obesity   . Paroxysmal atrial fibrillation     managed with anticoagulation and rate control.   . Atrial fibrillation     Past Surgical History  Procedure Laterality Date  . Cardiac catheterization  03/31/2000    EF 49%/LAD lg vessel which coursed to the apex & gave rise to 2 diagonal branches/ LAD noted to have 30% ostial lesion & tapers to a sm vessel toward the apex but no high grade lesion/1st diagonal med sized vessel & 2nd diagonal sm vessel with no significant disease/ lt ventriculogram reveals mild global hypokinesis w/ an estimated EF of 45-50%    History  Smoking status  . Former Smoker -- 0.50 packs/day for 20 years  . Types: Cigarettes  . Quit date: 10/18/1978  Smokeless tobacco  . Never Used    History  Alcohol Use  . Yes    Family History  Problem Relation Age of Onset  . Heart disease Father   . Asthma Father   . Diabetes Father   . Heart disease Mother   . Heart disease Brother   .  Hyperlipidemia Sister   . Hyperlipidemia Brother   . Hyperlipidemia Brother     Review of Systems: The review of systems is per the HPI.  All other systems were reviewed and are negative.  Physical Exam: Ht 5\' 6"  (1.676 m)  Wt 178 lb (80.74 kg)  BMI 28.74 kg/m2 Patient is very pleasant and in no acute distress. Skin is warm and dry. Color is normal.  HEENT is unremarkable. Normocephalic/atraumatic. PERRL. Sclera are nonicteric. Neck is supple. No masses. No JVD. Lungs are clear. Cardiac exam shows a regular rate and rhythm. Abdomen is soft. Extremities are without edema. Gait and ROM are intact. No gross neurologic deficits noted.  LABORATORY DATA: EKG pending  Lab Results  Component Value Date   WBC 8.1 01/11/2014   HGB 13.6 01/11/2014   HCT 40.0 01/11/2014   PLT 359 01/11/2014   GLUCOSE 101* 01/11/2014   CHOL 180 01/12/2014   TRIG 86 01/12/2014   HDL 60 01/12/2014   LDLDIRECT 163.2  01/19/2013   LDLCALC 103* 01/12/2014   ALT 12 01/11/2014   AST 13 01/11/2014   NA 142 01/11/2014   K 3.4* 01/11/2014   CL 104 01/11/2014   CREATININE 1.00 01/11/2014   BUN 25* 01/11/2014   CO2 26 01/11/2014   INR 1.03 01/11/2014   HGBA1C 5.6 01/11/2014   Echo Study Conclusions from March 2015  - Left ventricle: The cavity size was normal. Systolic function was normal. The estimated ejection fraction was in the range of 55% to 60%. Wall motion was normal; there were no regional wall motion abnormalities. Features are consistent with a pseudonormal left ventricular filling pattern, with concomitant abnormal relaxation and increased filling pressure (grade 2 diastolic dysfunction). Doppler parameters are consistent with elevated ventricular end-diastolic filling pressure. - Aortic valve: Trileaflet; normal thickness leaflets. Transvalvular velocity was within the normal range. There was no stenosis. No regurgitation. - Aortic root: The aortic root was normal in size. - Mitral valve: No regurgitation. - Left atrium: The atrium was mildly dilated. - Right ventricle: Systolic function was normal. - Tricuspid valve: No regurgitation. - Pulmonic valve: No regurgitation. - Pulmonary arteries: Systolic pressure was within the normal range. PA peak pressure: 59mm Hg (S).  Carotid Doppler Summary: Bilateral: intimal wall thickening CCA. Mild soft plaque origin ICA. 1-39% ICA stenosis. Vertebral artery flow is antegrade.  Other specific details can be found in the table(s) above. Prepared and Electronically Authenticated by  Antony Contras 2015-03-30T10:55:00.567    Assessment / Plan:  1. PAF - Seems to be in sinus today - will check EKG today - remains on Flecainide - would like Alexandra Pierce to review and see if further intervention/change is needed  2. HTN - BP is ok at home  3. Chronic anticoagulation - understands the need to be compliant  4. Stroke - minimal residual deficit.  See  back in 6 months. Check EKG today. No change in current regimen.  Patient is agreeable to this plan and will call if any problems develop in the interim.   Burtis Junes, RN, [facility] [address] [place], [place]  [phone]  Addendum:  Message from Alexandra Pierce regarding the visit from today   I think that's fine. If she has worsening symptoms of afib then we could consider another antiarrhythmic drug. I cant think of a better anticoagulant for her. Your stressing importance of compliance was appropriate. Thanks.

## 2014-03-29 NOTE — Patient Instructions (Addendum)
Stay on your current medicines  See me in 6 months  Call the [facility] office at [phone] if you have any questions, problems or concerns.   

## 2014-06-03 ENCOUNTER — Other Ambulatory Visit: Payer: Self-pay | Admitting: Internal Medicine

## 2014-06-22 ENCOUNTER — Other Ambulatory Visit: Payer: Self-pay | Admitting: Internal Medicine

## 2014-09-26 ENCOUNTER — Other Ambulatory Visit: Payer: Self-pay | Admitting: Internal Medicine

## 2014-09-27 ENCOUNTER — Ambulatory Visit: Payer: Medicare Other | Admitting: Nurse Practitioner

## 2014-11-06 ENCOUNTER — Other Ambulatory Visit: Payer: Self-pay | Admitting: Internal Medicine

## 2014-12-02 ENCOUNTER — Other Ambulatory Visit: Payer: Self-pay | Admitting: Internal Medicine

## 2014-12-02 NOTE — Telephone Encounter (Signed)
Needs to make follow up appointment for refills.

## 2015-01-17 ENCOUNTER — Ambulatory Visit (INDEPENDENT_AMBULATORY_CARE_PROVIDER_SITE_OTHER): Payer: Medicare Other | Admitting: Nurse Practitioner

## 2015-01-17 ENCOUNTER — Other Ambulatory Visit: Payer: Self-pay

## 2015-01-17 ENCOUNTER — Encounter: Payer: Self-pay | Admitting: Nurse Practitioner

## 2015-01-17 VITALS — BP 126/78 | HR 61 | Resp 18 | Ht 66.0 in | Wt 180.0 lb

## 2015-01-17 DIAGNOSIS — I480 Paroxysmal atrial fibrillation: Secondary | ICD-10-CM | POA: Diagnosis not present

## 2015-01-17 DIAGNOSIS — E785 Hyperlipidemia, unspecified: Secondary | ICD-10-CM | POA: Diagnosis not present

## 2015-01-17 DIAGNOSIS — Z7901 Long term (current) use of anticoagulants: Secondary | ICD-10-CM | POA: Diagnosis not present

## 2015-01-17 MED ORDER — ATORVASTATIN CALCIUM 10 MG PO TABS: 10.0000 mg | ORAL_TABLET | ORAL | Status: DC

## 2015-01-17 NOTE — Patient Instructions (Addendum)
Stay on your current medicines  I am adding Atorvastatin 10 mg to take only on M-W-F  Fasting labs in 6 weeks - BMET, Lipids, HPF & CBC  I will see you in 6 months  Call the [facility] office at [phone] if you have any questions, problems or concerns.

## 2015-01-17 NOTE — Progress Notes (Signed)
CARDIOLOGY OFFICE NOTE  Date:  01/17/2015    Alexandra Pierce Date of Birth: 1946/06/07 Medical Record #[id number]  PCP:  Antony Blackbird, MD  Cardiologist:  [facility]   Chief Complaint  Patient presents with  . Atrial Fibrillation    Follow up visit -seen for Dr. Rayann Heman.    History of Present Illness: Alexandra Pierce is a 69 y.o. female who presents today for a follow up visit. She is seen for Dr. Rayann Heman - former patient of Dr.Tennant's. She has PAF, remote stroke, and on chronic anticoagulation - now on Xarelto due to labile readings with her coumadin. Other issues include HLD, HTN and obesity. She has had statin intolerance. Prior cath in 2001 with mild CAD and EF of 45 to 50%.  Last saw me in June of 2015 and was doing well. She was hospitalized back in March of 2015 with recurrent stroke - multiple infarcts on MRI. Had missed several doses of her Xarelto. Was in atrial fib on arrival to the ER at that time. Understood the need to NOT miss her Xarelto. She had been traveling and really got out of her routine when that happened.   I had Dr. Rayann Heman review her medicines - he felt her current regimen was ok and that if she had worsening symptoms of afib then we could consider another antiarrhythmic drug.   Comes back today - here alone. Doing well. No chest pain. Not short of breath. Continues to work 4 days per week. Has a man in her life - she is quite happy with him. Rhythm has been ok. Ok on her medicines but has stopped her Pravachol due to myalgias. She has tried most of all of them. No one in her family tolerates the statins. She does not feel like she has ever tried to take just 2 to 3 times per week. She is not fasting today.   Past Medical History  Diagnosis Date  . Stroke 2001 & 2015  . Hypercholesteremia   . Hypertension     mild  . Obesity   . Paroxysmal atrial fibrillation     managed with anticoagulation and rate control.   . Atrial fibrillation   . Chronic anticoagulation      Past Surgical History  Procedure Laterality Date  . Cardiac catheterization  03/31/2000    EF 49%/LAD lg vessel which coursed to the apex & gave rise to 2 diagonal branches/ LAD noted to have 30% ostial lesion & tapers to a sm vessel toward the apex but no high grade lesion/1st diagonal med sized vessel & 2nd diagonal sm vessel with no significant disease/ lt ventriculogram reveals mild global hypokinesis w/ an estimated EF of 45-50%     Medications: Current Outpatient Prescriptions  Medication Sig Dispense Refill  . acetaminophen (TYLENOL) 500 MG tablet Take 1,000 mg by mouth as needed.      [name] Kitchen atorvastatin (LIPITOR) 10 MG tablet Take 1 tablet (10 mg total) by mouth 3 (three) times a week. 36 tablet 3  . cholecalciferol (VITAMIN D) 1000 UNITS tablet Take 1,000 Units by mouth daily.    . fenofibrate 160 MG tablet Take 1 tablet (160 mg total) by mouth daily. 90 tablet 3  . flecainide (TAMBOCOR) 100 MG tablet Take 1 tablet (100 mg total) by mouth 2 (two) times daily. 180 tablet 3  . ramipril (ALTACE) 2.5 MG capsule Take 1 capsule (2.5 mg total) by mouth 2 (two) times daily. Take 2 caps in the  AM and 1 cap in the PM 270 capsule 3  . XARELTO 20 MG TABS tablet TAKE ONE TABLET BY MOUTH ONE TIME DAILY  30 tablet 2   No current facility-administered medications for this visit.    Allergies: Allergies  Allergen Reactions  . Sulfonamide Derivatives     Social History: The patient  reports that she quit smoking about 36 years ago. Her smoking use included Cigarettes. She has a 10 pack-year smoking history. She has never used smokeless tobacco. She reports that she drinks alcohol.   Family History: The patient's family history includes Asthma in her father; Diabetes in her father; Heart disease in her brother, father, and mother; Hyperlipidemia in her brother, brother, and sister.   Review of Systems: Please see the history of present illness.   Otherwise, the review of systems is positive  for .   All other systems are reviewed and negative.   Physical Exam: VS:  BP 126/78 mmHg  Pulse 61  Resp 18  Ht 5\' 6"  (1.676 m)  Wt 180 lb (81.647 kg)  BMI 29.07 kg/m2  SpO2 97% .  BMI Body mass index is 29.07 kg/(m^2).  Wt Readings from Last 3 Encounters:  01/17/15 180 lb (81.647 kg)  03/29/14 178 lb (80.74 kg)  01/11/14 181 lb 7 oz (82.3 kg)    General: Pleasant. Well developed, well nourished and in no acute distress.  HEENT: Normal. Neck: Supple, no JVD, carotid bruits, or masses noted.  Cardiac: Regular rate and rhythm. No murmurs, rubs, or gallops. No edema.  Respiratory:  Lungs are clear to auscultation bilaterally with normal work of breathing.  GI: Soft and nontender.  MS: No deformity or atrophy. Gait and ROM intact. Skin: Warm and dry. Color is normal.  Neuro:  Strength and sensation are intact and no gross focal deficits noted.  Psych: Alert, appropriate and with normal affect.   LABORATORY DATA:  EKG:  EKG is ordered today. This demonstrates NSR.  Lab Results  Component Value Date   WBC 8.1 01/11/2014   HGB 13.6 01/11/2014   HCT 40.0 01/11/2014   PLT 359 01/11/2014   GLUCOSE 86 03/29/2014   CHOL 180 01/12/2014   TRIG 86 01/12/2014   HDL 60 01/12/2014   LDLDIRECT 163.2 01/19/2013   LDLCALC 103* 01/12/2014   ALT 12 01/11/2014   AST 13 01/11/2014   NA 139 03/29/2014   K 3.9 03/29/2014   CL 105 03/29/2014   CREATININE 0.8 03/29/2014   BUN 18 03/29/2014   CO2 28 03/29/2014   INR 1.03 01/11/2014   HGBA1C 5.6 01/11/2014    BNP (last 3 results) No results for input(s): BNP in the last 8760 hours.  ProBNP (last 3 results) No results for input(s): PROBNP in the last 8760 hours.   Other Studies Reviewed Today:   Echo Study Conclusions from March 2015  - Left ventricle: The cavity size was normal. Systolic function was normal. The estimated ejection fraction was in the range of 55% to 60%. Wall motion was normal; there were no regional wall  motion abnormalities. Features are consistent with a pseudonormal left ventricular filling pattern, with concomitant abnormal relaxation and increased filling pressure (grade 2 diastolic dysfunction). Doppler parameters are consistent with elevated ventricular end-diastolic filling pressure. - Aortic valve: Trileaflet; normal thickness leaflets. Transvalvular velocity was within the normal range. There was no stenosis. No regurgitation. - Aortic root: The aortic root was normal in size. - Mitral valve: No regurgitation. - Left atrium: The atrium  was mildly dilated. - Right ventricle: Systolic function was normal. - Tricuspid valve: No regurgitation. - Pulmonic valve: No regurgitation. - Pulmonary arteries: Systolic pressure was within the normal range. PA peak pressure: 62mm Hg (S).  Carotid Doppler Summary: Bilateral: intimal wall thickening CCA. Mild soft plaque origin ICA. 1-39% ICA stenosis. Vertebral artery flow is antegrade.  Other specific details can be found in the table(s) above. Prepared and Electronically Authenticated by  Antony Contras 2015-03-30T10:55:00.567   Assessment / Plan:  1. PAF - remains in sinus on Flecainide  2. HTN - BP looks good on her current regimen  3. Chronic anticoagulation - no problems noted.  4. Prior stroke - minimal residual deficit.  5. HLD - she is open to trying generic Lipitor 10 mg just 3 times a week. Fasting labs in 6 weeks.  Current medicines are reviewed with the patient today.  The patient does not have concerns regarding medicines other than what has been noted above.  The following changes have been made:  See above.  Labs/ tests ordered today include:    Orders Placed This Encounter  Procedures  . Basic metabolic panel  . Hepatic function panel  . Lipid panel  . CBC  . EKG 12-Lead     Disposition:   FU with me in 6 months  Patient is agreeable to this plan and will call if any problems develop in the  interim.   Signed: Burtis Junes, RN, ANP-C 01/17/2015 11:28 AM  [facility] [address] [place], [place]  [address] Phone: [phone] Fax: [phone]

## 2015-01-21 LAB — CYTOLOGY - PAP

## 2015-02-11 ENCOUNTER — Other Ambulatory Visit: Payer: Self-pay | Admitting: Internal Medicine

## 2015-02-28 ENCOUNTER — Other Ambulatory Visit (INDEPENDENT_AMBULATORY_CARE_PROVIDER_SITE_OTHER): Payer: Medicare Other | Admitting: *Deleted

## 2015-02-28 DIAGNOSIS — I480 Paroxysmal atrial fibrillation: Secondary | ICD-10-CM | POA: Diagnosis not present

## 2015-02-28 DIAGNOSIS — E785 Hyperlipidemia, unspecified: Secondary | ICD-10-CM | POA: Diagnosis not present

## 2015-02-28 DIAGNOSIS — Z7901 Long term (current) use of anticoagulants: Secondary | ICD-10-CM | POA: Diagnosis not present

## 2015-02-28 LAB — CBC
HCT: 38.8 % (ref 36.0–46.0)
Hemoglobin: 13.2 g/dL (ref 12.0–15.0)
MCHC: 34 g/dL (ref 30.0–36.0)
MCV: 90.5 fl (ref 78.0–100.0)
Platelets: 446 10*3/uL — ABNORMAL HIGH (ref 150.0–400.0)
RBC: 4.29 Mil/uL (ref 3.87–5.11)
RDW: 14.8 % (ref 11.5–15.5)
WBC: 5.4 10*3/uL (ref 4.0–10.5)

## 2015-02-28 LAB — BASIC METABOLIC PANEL
BUN: 15 mg/dL (ref 6–23)
CO2: 28 mEq/L (ref 19–32)
Calcium: 9.2 mg/dL (ref 8.4–10.5)
Chloride: 103 mEq/L (ref 96–112)
Creatinine, Ser: 0.89 mg/dL (ref 0.40–1.20)
GFR: 66.59 mL/min (ref >60.00)
Glucose, Bld: 87 mg/dL (ref 70–99)
Potassium: 3.9 mEq/L (ref 3.5–5.1)
Sodium: 136 mEq/L (ref 135–145)

## 2015-02-28 LAB — HEPATIC FUNCTION PANEL
ALT: 10 U/L (ref 0–35)
AST: 18 U/L (ref 0–37)
Albumin: 4.1 g/dL (ref 3.5–5.2)
Alkaline Phosphatase: 35 U/L — ABNORMAL LOW (ref 39–117)
Bilirubin, Direct: 0.1 mg/dL (ref 0.0–0.3)
Total Bilirubin: 0.5 mg/dL (ref 0.2–1.2)
Total Protein: 7.3 g/dL (ref 6.0–8.3)

## 2015-02-28 LAB — LIPID PANEL
Cholesterol: 201 mg/dL — ABNORMAL HIGH (ref 0–200)
HDL: 53.2 mg/dL (ref >39.00)
LDL Cholesterol: 137 mg/dL — ABNORMAL HIGH (ref 0–99)
NonHDL: 147.8
Total CHOL/HDL Ratio: 4
Triglycerides: 53 mg/dL (ref 0.0–149.0)
VLDL: 10.6 mg/dL (ref 0.0–40.0)

## 2015-05-07 ENCOUNTER — Other Ambulatory Visit: Payer: Self-pay | Admitting: Nurse Practitioner

## 2015-05-18 ENCOUNTER — Other Ambulatory Visit: Payer: Self-pay | Admitting: Nurse Practitioner

## 2015-06-12 ENCOUNTER — Other Ambulatory Visit: Payer: Self-pay | Admitting: Nurse Practitioner

## 2015-09-19 ENCOUNTER — Other Ambulatory Visit: Payer: Self-pay | Admitting: Internal Medicine

## 2015-09-26 ENCOUNTER — Ambulatory Visit (INDEPENDENT_AMBULATORY_CARE_PROVIDER_SITE_OTHER): Payer: Medicare Other | Admitting: Nurse Practitioner

## 2015-09-26 ENCOUNTER — Encounter: Payer: Self-pay | Admitting: Nurse Practitioner

## 2015-09-26 ENCOUNTER — Other Ambulatory Visit: Payer: Self-pay

## 2015-09-26 VITALS — BP 124/70 | HR 73 | Ht 66.0 in | Wt 183.8 lb

## 2015-09-26 DIAGNOSIS — Z7901 Long term (current) use of anticoagulants: Secondary | ICD-10-CM

## 2015-09-26 DIAGNOSIS — I480 Paroxysmal atrial fibrillation: Secondary | ICD-10-CM | POA: Diagnosis not present

## 2015-09-26 DIAGNOSIS — E785 Hyperlipidemia, unspecified: Secondary | ICD-10-CM | POA: Diagnosis not present

## 2015-09-26 LAB — LIPID PANEL
Cholesterol: 257 mg/dL — ABNORMAL HIGH (ref 125–200)
HDL: 42 mg/dL — ABNORMAL LOW (ref >=46)
LDL Cholesterol: 193 mg/dL — ABNORMAL HIGH (ref <130)
Total CHOL/HDL Ratio: 6.1 Ratio — ABNORMAL HIGH (ref <=5.0)
Triglycerides: 110 mg/dL (ref <150)
VLDL: 22 mg/dL (ref <30)

## 2015-09-26 LAB — BASIC METABOLIC PANEL
BUN: 15 mg/dL (ref 7–25)
CO2: 26 mmol/L (ref 20–31)
Calcium: 8.9 mg/dL (ref 8.6–10.4)
Chloride: 104 mmol/L (ref 98–110)
Creat: 0.82 mg/dL (ref 0.60–0.93)
Glucose, Bld: 92 mg/dL (ref 65–99)
Potassium: 4 mmol/L (ref 3.5–5.3)
Sodium: 140 mmol/L (ref 135–146)

## 2015-09-26 LAB — CBC
HCT: 38.9 % (ref 36.0–46.0)
Hemoglobin: 13.6 g/dL (ref 12.0–15.0)
MCH: 32.4 pg (ref 26.0–34.0)
MCHC: 35 g/dL (ref 30.0–36.0)
MCV: 92.6 fL (ref 78.0–100.0)
MPV: 9.6 fL (ref 8.6–12.4)
Platelets: 400 10*3/uL (ref 150–400)
RBC: 4.2 MIL/uL (ref 3.87–5.11)
RDW: 14.8 % (ref 11.5–15.5)
WBC: 6 10*3/uL (ref 4.0–10.5)

## 2015-09-26 LAB — HEPATIC FUNCTION PANEL
ALT: 13 U/L (ref 6–29)
AST: 16 U/L (ref 10–35)
Albumin: 4 g/dL (ref 3.6–5.1)
Alkaline Phosphatase: 58 U/L (ref 33–130)
Bilirubin, Direct: 0.1 mg/dL (ref <=0.2)
Indirect Bilirubin: 0.6 mg/dL (ref 0.2–1.2)
Total Bilirubin: 0.7 mg/dL (ref 0.2–1.2)
Total Protein: 7.1 g/dL (ref 6.1–8.1)

## 2015-09-26 NOTE — Patient Instructions (Addendum)
We will be checking the following labs today - BMET, Lipids, HPF and CBC   Medication Instructions:    Continue with your current medicines.     Testing/Procedures To Be Arranged:  N/A  Follow-Up:   See me in 6 months  EKG in one month to recheck your heart rhythm.     Other Special Instructions:   Referral to Lipid clinic    If you need a refill on your cardiac medications before your next appointment, please call your pharmacy.   Call the [facility] office at [phone] if you have any questions, problems or concerns.

## 2015-09-26 NOTE — Progress Notes (Addendum)
CARDIOLOGY OFFICE NOTE  Date:  09/26/2015    Alexandra Pierce Date of Birth: 13-Apr-1946 Medical Record [id number]  PCP:  Antony Blackbird, MD  Cardiologist:  Allred    Chief Complaint  Patient presents with  . Irregular Heart Beat    8 month check - seen for Dr. Rayann Heman  . Atrial Fibrillation  . Hypertension  . Hyperlipidemia    History of Present Illness: Alexandra Pierce is a 69 y.o. female who presents today for a follow up visit. This is an 8 month check. She is seen for Dr. Rayann Heman - former patient of Dr.Tennant's. She has PAF, remote stroke, and on chronic anticoagulation - now on Xarelto due to labile readings with her coumadin. Other issues include HLD, HTN and obesity. She has had statin intolerance. Prior cath in 2001 with mild CAD and EF of 45 to 50%.  Last saw me in June of 2015 and was doing well. She was hospitalized back in March of 2015 with recurrent stroke - multiple infarcts on MRI. Had missed several doses of her Xarelto. Was in atrial fib on arrival to the ER at that time. Understood the need to NOT miss her Xarelto. She had been traveling and really got out of her routine when that happened.   I had Dr. Rayann Heman review her medicines - he felt her current regimen was ok and that if she had worsening symptoms of afib then we could consider another antiarrhythmic drug.   I last saw her in April - she was doing well. Was seeing someone seriously and very happy. Cardiac status was stable.   Comes back today - here alone. She says she is doing well. She continues to work 4 days a week. No family close by. Still seeing someone and very happy. No chest pain. Breathing is good. Few spells of AF but nothing that seems to bother her. Says it mostly occurs when she gets stressed out.  Remains on her Xarelto - pretty expensive for her. She has a sinus infection currently - she does not think she is taking any decongestants and only doing plain Claritin. BP is good. She did stop her  Lipitor - could not get rid of a UTI - apparently this is one of the side effects. Basically intolerant to all statins. Interested in Lipid clinic.   Past Medical History  Diagnosis Date  . Stroke [facility]) 2001 & 2015  . Hypercholesteremia   . Hypertension     mild  . Obesity   . Paroxysmal atrial fibrillation (HCC)     managed with anticoagulation and rate control.   . Atrial fibrillation ([place])   . Chronic anticoagulation     Past Surgical History  Procedure Laterality Date  . Cardiac catheterization  03/31/2000    EF 49%/LAD lg vessel which coursed to the apex & gave rise to 2 diagonal branches/ LAD noted to have 30% ostial lesion & tapers to a sm vessel toward the apex but no high grade lesion/1st diagonal med sized vessel & 2nd diagonal sm vessel with no significant disease/ lt ventriculogram reveals mild global hypokinesis w/ an estimated EF of 45-50%     Medications: Current Outpatient Prescriptions  Medication Sig Dispense Refill  . acetaminophen (TYLENOL) 500 MG tablet Take 1,000 mg by mouth as needed for mild pain or headache.     . carisoprodol (SOMA) 350 MG tablet Take 350 mg by mouth. Take 350 mg by mouth 2 to 3 times daily  as needed for muscle spasms  0  . cholecalciferol (VITAMIN D) 1000 UNITS tablet Take 1,000 Units by mouth daily.    . fenofibrate 160 MG tablet TAKE ONE TABLET BY MOUTH ONCE DAILY 90 tablet 3  . flecainide (TAMBOCOR) 100 MG tablet TAKE ONE TABLET BY MOUTH TWICE DAILY 180 tablet 0  . ramipril (ALTACE) 2.5 MG capsule Take by mouth daily. Take 2 capsules by mouth in the morning and 1 capsule by mouth every evening    . XARELTO 20 MG TABS tablet TAKE ONE TABLET BY MOUTH ONE TIME DAILY 30 tablet 3   No current facility-administered medications for this visit.    Allergies: Allergies  Allergen Reactions  . Lipitor [Atorvastatin] Other (See Comments)    Caused bladder infection  . Pravachol [Pravastatin Sodium] Other (See Comments)    Myalgias  .  Statins Other (See Comments)    Myalgia  . Sulfonamide Derivatives     Social History: The patient  reports that she quit smoking about 36 years ago. Her smoking use included Cigarettes. She has a 10 pack-year smoking history. She has never used smokeless tobacco. She reports that she drinks alcohol.   Family History: The patient's family history includes Asthma in her father; Diabetes in her father; Heart disease in her brother, father, and mother; Hyperlipidemia in her brother, brother, and sister.   Review of Systems: Please see the history of present illness.   Otherwise, the review of systems is positive for none.   All other systems are reviewed and negative.   Physical Exam: VS:  BP 124/70 mmHg  Pulse 73  Ht 5\' 6"  (1.676 m)  Wt 183 lb 12.8 oz (83.371 kg)  BMI 29.68 kg/m2 .  BMI Body mass index is 29.68 kg/(m^2).  Wt Readings from Last 3 Encounters:  09/26/15 183 lb 12.8 oz (83.371 kg)  01/17/15 180 lb (81.647 kg)  03/29/14 178 lb (80.74 kg)    General: Pleasant. Well developed, well nourished and in no acute distress.  HEENT: Normal. Neck: Supple, no JVD, carotid bruits, or masses noted.  Cardiac: Little irregular rhythm today. Rate is ok - checking EKG today. No edema.  Respiratory:  Lungs are clear to auscultation bilaterally with normal work of breathing.  GI: Soft and nontender.  MS: No deformity or atrophy. Gait and ROM intact. Skin: Warm and dry. Color is normal.  Neuro:  Strength and sensation are intact and no gross focal deficits noted.  Psych: Alert, appropriate and with normal affect.   LABORATORY DATA:  EKG:  EKG is ordered today. This shows atrial flutter today with variable block. Her rate is controlled at 88.   Lab Results  Component Value Date   WBC 5.4 02/28/2015   HGB 13.2 02/28/2015   HCT 38.8 02/28/2015   PLT 446.0* 02/28/2015   GLUCOSE 87 02/28/2015   CHOL 201* 02/28/2015   TRIG 53.0 02/28/2015   HDL 53.20 02/28/2015   LDLDIRECT 163.2  01/19/2013   LDLCALC 137* 02/28/2015   ALT 10 02/28/2015   AST 18 02/28/2015   NA 136 02/28/2015   K 3.9 02/28/2015   CL 103 02/28/2015   CREATININE 0.89 02/28/2015   BUN 15 02/28/2015   CO2 28 02/28/2015   INR 1.03 01/11/2014   HGBA1C 5.6 01/11/2014    BNP (last 3 results) No results for input(s): BNP in the last 8760 hours.  ProBNP (last 3 results) No results for input(s): PROBNP in the last 8760 hours.   Other Studies  Reviewed Today:  Echo Study Conclusions from March 2015  - Left ventricle: The cavity size was normal. Systolic function was normal. The estimated ejection fraction was in the range of 55% to 60%. Wall motion was normal; there were no regional wall motion abnormalities. Features are consistent with a pseudonormal left ventricular filling pattern, with concomitant abnormal relaxation and increased filling pressure (grade 2 diastolic dysfunction). Doppler parameters are consistent with elevated ventricular end-diastolic filling pressure. - Aortic valve: Trileaflet; normal thickness leaflets. Transvalvular velocity was within the normal range. There was no stenosis. No regurgitation. - Aortic root: The aortic root was normal in size. - Mitral valve: No regurgitation. - Left atrium: The atrium was mildly dilated. - Right ventricle: Systolic function was normal. - Tricuspid valve: No regurgitation. - Pulmonic valve: No regurgitation. - Pulmonary arteries: Systolic pressure was within the normal range. PA peak pressure: 48mm Hg (S).  Carotid Doppler Summary: Bilateral: intimal wall thickening CCA. Mild soft plaque origin ICA. 1-39% ICA stenosis. Vertebral artery flow is antegrade.  Other specific details can be found in the table(s) above. Prepared and Electronically Authenticated by  Antony Contras 2015-03-30T10:55:00.567   Assessment / Plan:  1. PAF - EKG today shows atrial flutter - she is totally asymptomatic. I would like to repeat at EKG in  one month - may need to consider change in therapy/stopping Flecainide, etc if remains out of rhythm. She is totally asymptomatic. She had a normal EF on last echo. Her rate is controlled - if it wasn't I would be worried getting a tachycardia mediated CM. Will ask Dr. Rayann Heman to review as well.   2. HTN - BP looks good on her current regimen  3. Chronic anticoagulation - no problems noted. Try to give her samples today.   4. Prior stroke - minimal residual deficit.  5. HLD - statin intolerant. Will see if the Lipid clinic has other options for her. She was not able to afford Zetia in the past as well.   Current medicines are reviewed with the patient today.  The patient does not have concerns regarding medicines other than what has been noted above.  The following changes have been made:  See above.  Labs/ tests ordered today include:   No orders of the defined types were placed in this encounter.     Disposition:   FU with me in 6 months. EKG in one month to recheck.    Patient is agreeable to this plan and will call if any problems develop in the interim.   Signed: Burtis Junes, RN, ANP-C 09/26/2015 9:40 AM  [facility] [address] [place], [place]  [address] Phone: [phone] Fax: [phone]        Addendum: Reviewed Friday's visit with Dr. Rayann Heman today - 09/29/2015 He feels EKG is atrial fib. Rate is controlled. He agrees with repeat EKG in one month. If remains out of rhythm at that time - could consider stopping her Flecainide. She is to remain on her anticoagulation.   Burtis Junes, RN, [facility] [address] [place], [place]  [address] [phone]

## 2015-10-03 ENCOUNTER — Ambulatory Visit: Payer: Medicare Other | Admitting: Pharmacist

## 2015-10-21 ENCOUNTER — Other Ambulatory Visit: Payer: Self-pay | Admitting: Nurse Practitioner

## 2015-10-21 ENCOUNTER — Other Ambulatory Visit: Payer: Self-pay | Admitting: Internal Medicine

## 2015-11-10 ENCOUNTER — Encounter: Payer: Self-pay | Admitting: *Deleted

## 2015-11-14 ENCOUNTER — Telehealth: Payer: Self-pay | Admitting: Nurse Practitioner

## 2015-11-14 NOTE — Telephone Encounter (Signed)
Fu  Pt returning RN phone call- lab results. Please call back and discuss.   

## 2015-11-17 NOTE — Telephone Encounter (Signed)
Left message on machine for pt to contact the office.   

## 2016-01-28 ENCOUNTER — Other Ambulatory Visit: Payer: Self-pay | Admitting: Internal Medicine

## 2016-03-15 ENCOUNTER — Observation Stay (HOSPITAL_COMMUNITY)
Admission: EM | Admit: 2016-03-15 | Discharge: 2016-03-18 | Disposition: A | Discharge status: Home / Self Care | Payer: Medicare Other | Attending: Internal Medicine | Admitting: Internal Medicine

## 2016-03-15 ENCOUNTER — Encounter (HOSPITAL_COMMUNITY): Payer: Self-pay | Admitting: Emergency Medicine

## 2016-03-15 ENCOUNTER — Emergency Department (HOSPITAL_COMMUNITY): Payer: Medicare Other

## 2016-03-15 DIAGNOSIS — Z8673 Personal history of transient ischemic attack (TIA), and cerebral infarction without residual deficits: Secondary | ICD-10-CM | POA: Diagnosis not present

## 2016-03-15 DIAGNOSIS — I10 Essential (primary) hypertension: Secondary | ICD-10-CM | POA: Diagnosis present

## 2016-03-15 DIAGNOSIS — Z87891 Personal history of nicotine dependence: Secondary | ICD-10-CM | POA: Diagnosis not present

## 2016-03-15 DIAGNOSIS — N179 Acute kidney failure, unspecified: Secondary | ICD-10-CM | POA: Diagnosis present

## 2016-03-15 DIAGNOSIS — I480 Paroxysmal atrial fibrillation: Secondary | ICD-10-CM | POA: Insufficient documentation

## 2016-03-15 DIAGNOSIS — I4891 Unspecified atrial fibrillation: Secondary | ICD-10-CM | POA: Diagnosis present

## 2016-03-15 DIAGNOSIS — I482 Chronic atrial fibrillation: Secondary | ICD-10-CM | POA: Insufficient documentation

## 2016-03-15 DIAGNOSIS — E7800 Pure hypercholesterolemia, unspecified: Secondary | ICD-10-CM | POA: Insufficient documentation

## 2016-03-15 DIAGNOSIS — Z79899 Other long term (current) drug therapy: Secondary | ICD-10-CM | POA: Diagnosis not present

## 2016-03-15 DIAGNOSIS — I2584 Coronary atherosclerosis due to calcified coronary lesion: Secondary | ICD-10-CM | POA: Diagnosis not present

## 2016-03-15 DIAGNOSIS — Z8249 Family history of ischemic heart disease and other diseases of the circulatory system: Secondary | ICD-10-CM | POA: Insufficient documentation

## 2016-03-15 DIAGNOSIS — E669 Obesity, unspecified: Secondary | ICD-10-CM | POA: Diagnosis not present

## 2016-03-15 DIAGNOSIS — I200 Unstable angina: Secondary | ICD-10-CM | POA: Insufficient documentation

## 2016-03-15 DIAGNOSIS — Z6828 Body mass index (BMI) 28.0-28.9, adult: Secondary | ICD-10-CM | POA: Insufficient documentation

## 2016-03-15 DIAGNOSIS — Z7901 Long term (current) use of anticoagulants: Secondary | ICD-10-CM | POA: Insufficient documentation

## 2016-03-15 DIAGNOSIS — I25110 Atherosclerotic heart disease of native coronary artery with unstable angina pectoris: Principal | ICD-10-CM | POA: Insufficient documentation

## 2016-03-15 DIAGNOSIS — R072 Precordial pain: Secondary | ICD-10-CM | POA: Diagnosis present

## 2016-03-15 DIAGNOSIS — R079 Chest pain, unspecified: Secondary | ICD-10-CM | POA: Diagnosis present

## 2016-03-15 DIAGNOSIS — R002 Palpitations: Secondary | ICD-10-CM | POA: Diagnosis not present

## 2016-03-15 HISTORY — DX: Low back pain, unspecified: M54.50

## 2016-03-15 HISTORY — DX: Other chronic pain: G89.29

## 2016-03-15 HISTORY — DX: Low back pain: M54.5

## 2016-03-15 LAB — CBC
HCT: 40.6 % (ref 36.0–46.0)
HEMOGLOBIN: 13.9 g/dL (ref 12.0–15.0)
MCH: 33.5 pg (ref 26.0–34.0)
MCHC: 34.2 g/dL (ref 30.0–36.0)
MCV: 97.8 fL (ref 78.0–100.0)
Platelets: 400 10*3/uL (ref 150–400)
RBC: 4.15 MIL/uL (ref 3.87–5.11)
RDW: 14.4 % (ref 11.5–15.5)
WBC: 5.2 10*3/uL (ref 4.0–10.5)

## 2016-03-15 LAB — BASIC METABOLIC PANEL
ANION GAP: 10 (ref 5–15)
BUN: 23 mg/dL — AB (ref 6–20)
CALCIUM: 9.5 mg/dL (ref 8.9–10.3)
CO2: 25 mmol/L (ref 22–32)
CREATININE: 1.35 mg/dL — AB (ref 0.44–1.00)
Chloride: 104 mmol/L (ref 101–111)
GFR calc Af Amer: 45 mL/min — ABNORMAL LOW (ref >60)
GFR calc non Af Amer: 38 mL/min — ABNORMAL LOW (ref >60)
GLUCOSE: 140 mg/dL — AB (ref 65–99)
Potassium: 3.2 mmol/L — ABNORMAL LOW (ref 3.5–5.1)
Sodium: 139 mmol/L (ref 135–145)

## 2016-03-15 LAB — I-STAT TROPONIN, ED: TROPONIN I, POC: 0.01 ng/mL (ref 0.00–0.08)

## 2016-03-15 NOTE — ED Notes (Addendum)
Pt. reports central chest discomfort /palpitations with SOB , diaphoresis and nausea onset this evening . Pt. took 2 regular ASA prior to arrival with slight relief.

## 2016-03-15 NOTE — ED Provider Notes (Signed)
CSN: [id number]     Arrival date & time 03/15/16  2230 History  By signing my name below, I, Georgette Shell, attest that this documentation has been prepared under the direction and in the presence of Nyles Mitton, MD. Electronically Signed: Georgette Shell, ED Scribe. 03/16/2016. 12:16 AM.   Chief Complaint  Patient presents with  . Chest Pain  . Palpitations   Patient is a 70 y.o. female presenting with chest pain and palpitations. The history is provided by the patient. No language interpreter was used.  Chest Pain Pain location:  Substernal area Pain quality: tightness   Pain radiates to:  Does not radiate Pain radiates to the back: no   Pain severity:  Moderate Onset quality:  Sudden Duration:  3 hours Timing:  Intermittent Context: eating and lifting   Relieved by:  Nothing Worsened by:  Nothing tried Ineffective treatments:  Aspirin and antacids Associated symptoms: diaphoresis (when CP started) and palpitations   Associated symptoms: no abdominal pain, no back pain, no cough and not vomiting   Risk factors: high cholesterol and hypertension   Palpitations Associated symptoms: chest pain and diaphoresis (when CP started)   Associated symptoms: no back pain, no cough and no vomiting     HPI Comments: Alexandra Pierce is a 70 y.o. female who has a PMHx of CVA, hypercholesteremia, HTN, paroxymal a-fib and chronic anticoagulation presents to the Emergency Department complaining of tight, squeezing chest pains onset around 10 pm. Patient was carrying a 12 pound suitcase up the stairs when her chest pain started. Patient had associated diaphoresis, palpitations and nausea. Patient has not had this happened before but has had chest pain in the past, in particular heartburn. Patient reports eating barbecue sandwich and fries prior to chest pain.  Patient had a CATH twice in 2000 when she had CVA and another one a decade later. Patient has a cardiologist, Vilinda Flake. At [facility]. Patient took Tums and  Aspirin to alleviate symptoms with moderate relief. Patient is asymptomatic now. Denies sore throat, cough, vomiting, nausea, abd pain, or back pain.     Past Medical History  Diagnosis Date  . Stroke [facility]) 2001 & 2015  . Hypercholesteremia   . Hypertension     mild  . Obesity   . Paroxysmal atrial fibrillation (HCC)     managed with anticoagulation and rate control.   . Atrial fibrillation ([place])   . Chronic anticoagulation    Past Surgical History  Procedure Laterality Date  . Cardiac catheterization  03/31/2000    EF 49%/LAD lg vessel which coursed to the apex & gave rise to 2 diagonal branches/ LAD noted to have 30% ostial lesion & tapers to a sm vessel toward the apex but no high grade lesion/1st diagonal med sized vessel & 2nd diagonal sm vessel with no significant disease/ lt ventriculogram reveals mild global hypokinesis w/ an estimated EF of 45-50%   Family History  Problem Relation Age of Onset  . Heart disease Father   . Asthma Father   . Diabetes Father   . Heart disease Mother   . Heart disease Brother   . Hyperlipidemia Sister   . Hyperlipidemia Brother   . Hyperlipidemia Brother    Social History  Substance Use Topics  . Smoking status: Former Smoker -- 0.00 packs/day for 0 years    Types: Cigarettes    Quit date: 10/18/1978  . Smokeless tobacco: Never Used  . Alcohol Use: Yes   OB History    No  data available     Review of Systems  Constitutional: Positive for diaphoresis (when CP started).  Respiratory: Negative for cough.   Cardiovascular: Positive for chest pain and palpitations. Negative for leg swelling.  Gastrointestinal: Negative for vomiting and abdominal pain.  Musculoskeletal: Negative for back pain.  All other systems reviewed and are negative.     Allergies  Lipitor; Pravachol; Statins; and Sulfonamide derivatives  Home Medications   Prior to Admission medications   Medication Sig Start Date End Date Taking? Authorizing Provider   acetaminophen (TYLENOL) 500 MG tablet Take 1,000 mg by mouth as needed for mild pain or headache.     Historical Provider, MD  carisoprodol (SOMA) 350 MG tablet Take 350 mg by mouth. Take 350 mg by mouth 2 to 3 times daily as needed for muscle spasms 08/22/15   Historical Provider, MD  cholecalciferol (VITAMIN D) 1000 UNITS tablet Take 1,000 Units by mouth daily.    Historical Provider, MD  fenofibrate 160 MG tablet TAKE ONE TABLET BY MOUTH ONCE DAILY 05/08/15   Burtis Junes, NP  flecainide (TAMBOCOR) 100 MG tablet TAKE ONE TABLET BY MOUTH TWICE DAILY 10/22/15   Burtis Junes, NP  ramipril (ALTACE) 2.5 MG capsule TAKE 1 CAPSULE BY MOUTH 3 TIMES TIMES DAILY. TAKE 2 CAPSULES IN THE MORNING, AND 1 CAPSULE IN THE EVENING 10/22/15   Burtis Junes, NP  XARELTO 20 MG TABS tablet TAKE ONE TABLET BY MOUTH ONE TIME DAILY 01/28/16   Thompson Grayer, MD   Triage vitals: BP 149/85 mmHg  Pulse 91  Temp(Src) 97.3 F (36.3 C) (Oral)  Resp 22  Ht 5\' 6"  (1.676 m)  Wt 173 lb (78.472 kg)  BMI 27.94 kg/m2  SpO2 100% Physical Exam  Constitutional: She is oriented to person, place, and time. She appears well-developed and well-nourished. No distress.  HENT:  Head: Normocephalic and atraumatic.  Mouth/Throat: Oropharynx is clear and moist.  Eyes: Conjunctivae are normal. Pupils are equal, round, and reactive to light.  Neck: Normal range of motion. Neck supple.  Cardiovascular: Normal rate, regular rhythm and intact distal pulses.   Sinus rhythm  Pulmonary/Chest: Effort normal and breath sounds normal. No stridor. She has no wheezes. She has no rales.  Abdominal: Soft. Bowel sounds are normal. There is no tenderness. There is no rebound and no guarding.  Musculoskeletal: Normal range of motion. She exhibits no edema.  Intact DP pulses, no edema, compartments soft  Neurological: She is alert and oriented to person, place, and time. She has normal reflexes.  Skin: Skin is dry. She is not diaphoretic.   Psychiatric: She has a normal mood and affect.  Nursing note and vitals reviewed.   ED Course  Procedures (including critical care time) DIAGNOSTIC STUDIES: Oxygen Saturation is 100% on RA, normal by my interpretation.    COORDINATION OF CARE: 11:43 PM Discussed treatment plan with pt at bedside which includes lab work, cardiac monitoring and consult with cardiologist and pt agreed to plan. Pt was informed that she may need to be admitted for further evaluation and pt agreed.   Labs Review Labs Reviewed  BASIC METABOLIC PANEL - Abnormal; Notable for the following:    Potassium 3.2 (*)    Glucose, Bld 140 (*)    BUN 23 (*)    Creatinine, Ser 1.35 (*)    GFR calc non Af Amer 38 (*)    GFR calc Af Amer 45 (*)    All other components within normal limits  CBC  I-STAT TROPOININ, ED    Imaging Review Dg Chest 2 View  03/15/2016  CLINICAL DATA:  Acute onset of bilateral chest pain. Initial encounter. EXAM: CHEST  2 VIEW COMPARISON:  None. FINDINGS: The lungs are well-aerated and clear. There is no evidence of focal opacification, pleural effusion or pneumothorax. The heart is normal in size; the mediastinal contour is within normal limits. No acute osseous abnormalities are seen. IMPRESSION: No acute cardiopulmonary process seen. Electronically Signed   By: Garald Balding M.D.   On: 03/15/2016 23:03   I have personally reviewed and evaluated these images and lab results as part of my medical decision-making.   EKG Interpretation None      EKG Interpretation  Date/Time:  Monday Mar 15 2016 22:34:26 EDT Ventricular Rate:  89 PR Interval:    QRS Duration: 96 QT Interval:  328 QTC Calculation: 399 R Axis:   43 Text Interpretation:  Atrial fibrillation Nonspecific T wave abnormality Confirmed by [facility]  MD, Kelen Laura ([id number]) on 03/15/2016 11:46:16 PM       MDM   Final diagnoses:  None    Results for orders placed or performed during the hospital encounter of [id number]   Basic metabolic panel  Result Value Ref Range   Sodium 139 135 - 145 mmol/L   Potassium 3.2 (L) 3.5 - 5.1 mmol/L   Chloride 104 101 - 111 mmol/L   CO2 25 22 - 32 mmol/L   Glucose, Bld 140 (H) 65 - 99 mg/dL   BUN 23 (H) 6 - 20 mg/dL   Creatinine, Ser 1.35 (H) 0.44 - 1.00 mg/dL   Calcium 9.5 8.9 - 10.3 mg/dL   GFR calc non Af Amer 38 (L) >60 mL/min   GFR calc Af Amer 45 (L) >60 mL/min   Anion gap 10 5 - 15  CBC  Result Value Ref Range   WBC 5.2 4.0 - 10.5 K/uL   RBC 4.15 3.87 - 5.11 MIL/uL   Hemoglobin 13.9 12.0 - 15.0 g/dL   HCT 40.6 36.0 - 46.0 %   MCV 97.8 78.0 - 100.0 fL   MCH 33.5 26.0 - 34.0 pg   MCHC 34.2 30.0 - 36.0 g/dL   RDW 14.4 11.5 - 15.5 %   Platelets 400 150 - 400 K/uL  I-stat troponin, ED  Result Value Ref Range   Troponin i, poc 0.01 0.00 - 0.08 ng/mL   Comment 3           Dg Chest 2 View  03/15/2016  CLINICAL DATA:  Acute onset of bilateral chest pain. Initial encounter. EXAM: CHEST  2 VIEW COMPARISON:  None. FINDINGS: The lungs are well-aerated and clear. There is no evidence of focal opacification, pleural effusion or pneumothorax. The heart is normal in size; the mediastinal contour is within normal limits. No acute osseous abnormalities are seen. IMPRESSION: No acute cardiopulmonary process seen. Electronically Signed   By: Garald Balding M.D.   On: 03/15/2016 23:03    Medications - No data to display  Will admit to obs tele as HEART score is elevated   I personally performed the services described in this documentation, which was scribed in my presence. The recorded information has been reviewed and is accurate.      Veatrice Kells, MD 03/16/16 [phone]

## 2016-03-16 ENCOUNTER — Encounter (HOSPITAL_COMMUNITY): Payer: Self-pay | Admitting: Emergency Medicine

## 2016-03-16 DIAGNOSIS — I25110 Atherosclerotic heart disease of native coronary artery with unstable angina pectoris: Secondary | ICD-10-CM | POA: Diagnosis not present

## 2016-03-16 DIAGNOSIS — N179 Acute kidney failure, unspecified: Secondary | ICD-10-CM

## 2016-03-16 DIAGNOSIS — R079 Chest pain, unspecified: Secondary | ICD-10-CM | POA: Diagnosis not present

## 2016-03-16 DIAGNOSIS — I10 Essential (primary) hypertension: Secondary | ICD-10-CM

## 2016-03-16 LAB — URINALYSIS, ROUTINE W REFLEX MICROSCOPIC
BILIRUBIN URINE: NEGATIVE
Bilirubin Urine: NEGATIVE
GLUCOSE, UA: NEGATIVE mg/dL
Glucose, UA: NEGATIVE mg/dL
HGB URINE DIPSTICK: NEGATIVE
Hgb urine dipstick: NEGATIVE
Ketones, ur: NEGATIVE mg/dL
Ketones, ur: NEGATIVE mg/dL
NITRITE: NEGATIVE
Nitrite: NEGATIVE
PH: 7 (ref 5.0–8.0)
Protein, ur: NEGATIVE mg/dL
Protein, ur: NEGATIVE mg/dL
SPECIFIC GRAVITY, URINE: 1.014 (ref 1.005–1.030)
Specific Gravity, Urine: 1.015 (ref 1.005–1.030)
pH: 6 (ref 5.0–8.0)

## 2016-03-16 LAB — URINE MICROSCOPIC-ADD ON
RBC / HPF: NONE SEEN RBC/hpf (ref 0–5)
RBC / HPF: NONE SEEN RBC/hpf (ref 0–5)
Squamous Epithelial / LPF: NONE SEEN

## 2016-03-16 LAB — TROPONIN I
TROPONIN I: 0.04 ng/mL — AB (ref <0.031)
TROPONIN I: 0.04 ng/mL — AB (ref <0.031)
TROPONIN I: 0.04 ng/mL — AB (ref <0.031)

## 2016-03-16 LAB — APTT
APTT: 43 s — AB (ref 24–37)
aPTT: 81 seconds — ABNORMAL HIGH (ref 24–37)

## 2016-03-16 LAB — SURGICAL PCR SCREEN
MRSA, PCR: NEGATIVE
STAPHYLOCOCCUS AUREUS: NEGATIVE

## 2016-03-16 LAB — HEPARIN LEVEL (UNFRACTIONATED): Heparin Unfractionated: 2 IU/mL — ABNORMAL HIGH (ref 0.30–0.70)

## 2016-03-16 MED ORDER — ASPIRIN EC 325 MG PO TBEC
325.0000 mg | DELAYED_RELEASE_TABLET | Freq: Every day | ORAL | Status: DC
  Administered 2016-03-16: 325 mg via ORAL
  Filled 2016-03-16: qty 1

## 2016-03-16 MED ORDER — SODIUM CHLORIDE 0.9 % WEIGHT BASED INFUSION
3.0000 mL/kg/h | INTRAVENOUS | Status: DC
  Administered 2016-03-17: 3 mL/kg/h via INTRAVENOUS

## 2016-03-16 MED ORDER — FENOFIBRATE 160 MG PO TABS
160.0000 mg | ORAL_TABLET | Freq: Every day | ORAL | Status: DC
  Administered 2016-03-16 – 2016-03-18 (×3): 160 mg via ORAL
  Filled 2016-03-16 (×3): qty 1

## 2016-03-16 MED ORDER — ASPIRIN 81 MG PO CHEW
81.0000 mg | CHEWABLE_TABLET | ORAL | Status: AC
  Administered 2016-03-17: 81 mg via ORAL
  Filled 2016-03-16: qty 1

## 2016-03-16 MED ORDER — SODIUM CHLORIDE 0.9 % IV SOLN
INTRAVENOUS | Status: DC
  Administered 2016-03-16 (×2): via INTRAVENOUS

## 2016-03-16 MED ORDER — SODIUM CHLORIDE 0.9 % WEIGHT BASED INFUSION
1.0000 mL/kg/h | INTRAVENOUS | Status: DC
  Administered 2016-03-17: 1 mL/kg/h via INTRAVENOUS

## 2016-03-16 MED ORDER — SODIUM CHLORIDE 0.9% FLUSH: 3.0000 mL | INTRAVENOUS | Status: DC | PRN

## 2016-03-16 MED ORDER — VITAMIN D 1000 UNITS PO TABS
1000.0000 [IU] | ORAL_TABLET | Freq: Every day | ORAL | Status: DC
  Administered 2016-03-16 – 2016-03-18 (×3): 1000 [IU] via ORAL
  Filled 2016-03-16 (×3): qty 1

## 2016-03-16 MED ORDER — SODIUM CHLORIDE 0.9% FLUSH: 3.0000 mL | Freq: Two times a day (BID) | INTRAVENOUS | Status: DC

## 2016-03-16 MED ORDER — MORPHINE SULFATE (PF) 2 MG/ML IV SOLN: 2.0000 mg | INTRAVENOUS | Status: DC | PRN

## 2016-03-16 MED ORDER — FLECAINIDE ACETATE 50 MG PO TABS
100.0000 mg | ORAL_TABLET | Freq: Two times a day (BID) | ORAL | Status: DC
  Administered 2016-03-16 (×2): 100 mg via ORAL
  Filled 2016-03-16 (×3): qty 1

## 2016-03-16 MED ORDER — ACETAMINOPHEN 325 MG PO TABS: 650.0000 mg | ORAL_TABLET | ORAL | Status: DC | PRN

## 2016-03-16 MED ORDER — SODIUM CHLORIDE 0.9 % IV SOLN: 250.0000 mL | INTRAVENOUS | Status: DC | PRN

## 2016-03-16 MED ORDER — ONDANSETRON HCL 4 MG/2ML IJ SOLN: 4.0000 mg | Freq: Four times a day (QID) | INTRAMUSCULAR | Status: DC | PRN

## 2016-03-16 MED ORDER — HEPARIN (PORCINE) IN NACL 100-0.45 UNIT/ML-% IJ SOLN
950.0000 [IU]/h | INTRAMUSCULAR | Status: DC
  Administered 2016-03-16: 1100 [IU]/h via INTRAVENOUS
  Filled 2016-03-16 (×3): qty 250

## 2016-03-16 NOTE — Consult Note (Signed)
Cardiology Consult    Patient ID: Alexandra Pierce MRN: [id number], DOB/AGE: Nov 06, 1945   Admit date: 03/15/2016 Date of Consult: 03/16/2016  Primary Physician: Antony Blackbird, MD Reason for Consult: Chest Pain Primary Cardiologist: Dr. Rayann Heman Requesting Provider: Dr. Doyle Askew   History of Present Illness    Alexandra Pierce is a 70 y.o. female with past medical history of PAF (on Xarelto and Flecainide), CVA, HTN, HLD, and mild CAD by cath in 2001 who presented to [facility] ED on 03/15/2016 for evaluation of chest pain.   Reports the pain started around 9:45PM yesterday evening. She was carrying luggage up the stairs in her home when she developed a crushing chest pain. All at once became diaphoretic and felt nauseated. Her friend came over and gave her 2-81mg  ASA and she says that helped but the pain did not fully subside for approximately 30 minutes. Denies any repeat pain since. She denies any prior episodes of similar chest pain. Has GERD and takes Tums as needed but says this felt completely different from her usual heartburn symptoms.  She also lives in a 3-story townhome and says she has noticed worsening dyspnea with exertion for the past several months when climbing stairs. She does report her mother passed away last week and she was returning home from a trip for her bereavement, but she does not feel this pain was set-off by emotions, for she is at peace with her mother's passing.  While admitted, her initial troponin value was negative with subsequent values being mildly positive at 0.04. BMET showed hypokalemia of 3.2 and creatinine of 1.35 (was 0.82 five months ago). CBC with no significant abnormalities. EKG shows atrial fibrillation with HR of 89 and no acute ischemic changes noted when compared to previous tracings. Xarelto was held at time of admission and she has been started on Heparin.  Last echocardiogram was in 12/2013 and showed an EF of 55-60% with no wall motion abnormalities.  Grade 2 DD was noted. Her cardiac catheterization from 2001 showed a 30% ostial lesion with no other significant disease noted.  Past Medical History   Past Medical History  Diagnosis Date  . Stroke [facility]) 2001 & 2015  . Hypercholesteremia   . Hypertension     mild  . Obesity   . Paroxysmal atrial fibrillation (HCC)     managed with anticoagulation and rate control.   . Atrial fibrillation ([place])   . Chronic anticoagulation     Past Surgical History  Procedure Laterality Date  . Cardiac catheterization  03/31/2000    EF 49%/LAD lg vessel which coursed to the apex & gave rise to 2 diagonal branches/ LAD noted to have 30% ostial lesion & tapers to a sm vessel toward the apex but no high grade lesion/1st diagonal med sized vessel & 2nd diagonal sm vessel with no significant disease/ lt ventriculogram reveals mild global hypokinesis w/ an estimated EF of 45-50%     Allergies  Allergies  Allergen Reactions  . Sulfonamide Derivatives Anaphylaxis  . Lipitor [Atorvastatin] Other (See Comments)    Caused bladder infection  . Pravachol [Pravastatin Sodium] Other (See Comments)    Myalgias  . Statins Other (See Comments)    Myalgia    Inpatient Medications    . aspirin EC  325 mg Oral Daily  . cholecalciferol  1,000 Units Oral Daily  . fenofibrate  160 mg Oral Daily  . flecainide  100 mg Oral BID    Family History  Family History  Problem Relation Age of Onset  . Heart disease Father   . Asthma Father   . Diabetes Father   . Heart disease Mother   . Heart disease Brother   . Hyperlipidemia Sister   . Hyperlipidemia Brother   . Hyperlipidemia Brother     Social History    Social History   Social History  . Marital Status: Divorced    Spouse Name: N/A  . Number of Children: N/A  . Years of Education: N/A   Occupational History  . Not on file.   Social History Main Topics  . Smoking status: Former Smoker -- 0.00 packs/day for 0 years    Types: Cigarettes     Quit date: 10/18/1978  . Smokeless tobacco: Never Used  . Alcohol Use: Yes  . Drug Use: No  . Sexual Activity: Not on file   Other Topics Concern  . Not on file   Social History Narrative     Review of Systems    General:  No chills, fever, night sweats or weight changes.  Cardiovascular:  No edema, orthopnea, palpitations, paroxysmal nocturnal dyspnea. Positive for chest pain and dyspnea on exertion.  Dermatological: No rash, lesions/masses Respiratory: No cough, dyspnea Urologic: No hematuria, dysuria Abdominal:   No nausea, vomiting, diarrhea, bright red blood per rectum, melena, or hematemesis Neurologic:  No visual changes, wkns, changes in mental status. All other systems reviewed and are otherwise negative except as noted above.  Physical Exam    Blood pressure 130/69, pulse 81, temperature 97.5 F (36.4 C), temperature source Oral, resp. rate 18, height 5\' 6"  (1.676 m), weight 175 lb 14.4 oz (79.788 kg), SpO2 97 %.  General: Pleasant, Caucasian female appearing in NAD Psych: Normal affect. Neuro: Alert and oriented X 3. Moves all extremities spontaneously. HEENT: Normal  Neck: Supple without bruits or JVD. Lungs:  Resp regular and unlabored, CTA without wheezing or rales. Heart: RRR no s3, s4, or murmurs. Abdomen: Soft, non-tender, non-distended, BS + x 4.  Extremities: No clubbing, cyanosis or edema. DP/PT/Radials 2+ and equal bilaterally.  Labs    Troponin [facility] Care Test)  Recent Labs  03/15/16 2257  TROPIPOC 0.01    Recent Labs  03/16/16 0240 03/16/16 0810  TROPONINI 0.04* 0.04*   Lab Results  Component Value Date   WBC 5.2 03/15/2016   HGB 13.9 03/15/2016   HCT 40.6 03/15/2016   MCV 97.8 03/15/2016   PLT 400 03/15/2016    Recent Labs Lab 03/15/16 2245  NA 139  K 3.2*  CL 104  CO2 25  BUN 23*  CREATININE 1.35*  CALCIUM 9.5  GLUCOSE 140*   Lab Results  Component Value Date   CHOL 257* 09/26/2015   HDL 42* 09/26/2015   LDLCALC  193* 09/26/2015   TRIG 110 09/26/2015   No results found for: [facility]   Radiology Studies    Dg Chest 2 View: 03/15/2016  CLINICAL DATA:  Acute onset of bilateral chest pain. Initial encounter. EXAM: CHEST  2 VIEW COMPARISON:  None. FINDINGS: The lungs are well-aerated and clear. There is no evidence of focal opacification, pleural effusion or pneumothorax. The heart is normal in size; the mediastinal contour is within normal limits. No acute osseous abnormalities are seen. IMPRESSION: No acute cardiopulmonary process seen. Electronically Signed   By: Garald Balding M.D.   On: 03/15/2016 23:03    EKG & Cardiac Imaging    EKG: Atrial fibrillation, HR 89, no acute ischemic changes  noted when compared to previous tracings.  Echocardiogram: 12/2013 Study Conclusions - Left ventricle: The cavity size was normal. Systolic function was normal. The estimated ejection fraction was in the range of 55% to 60%. Wall motion was normal; there were no regional wall motion abnormalities. Features are consistent with a pseudonormal left ventricular filling pattern, with concomitant abnormal relaxation and increased filling pressure (grade 2 diastolic dysfunction). Doppler parameters are consistent with elevated ventricular end-diastolic filling pressure. - Aortic valve: Trileaflet; normal thickness leaflets. Transvalvular velocity was within the normal range. There was no stenosis. No regurgitation. - Aortic root: The aortic root was normal in size. - Mitral valve: No regurgitation. - Left atrium: The atrium was mildly dilated. - Right ventricle: Systolic function was normal. - Tricuspid valve: No regurgitation. - Pulmonic valve: No regurgitation. - Pulmonary arteries: Systolic pressure was within the normal range. PA peak pressure: 91mm Hg (S).   Cardiac Catheterization: 03/2000 FINDINGS: 1. This revealed a large left main with no significant disease. 2. The LAD is a  large vessel which coursed to the apex and gave rise to two  diagonal branches. The LAD is noted to have a 30% ostial lesion and tapers  to a small vessel toward the apex but has no high grade lesion. The first  diagonal is a medium sized vessel with no significant disease. The second  diagonal is a small vessel with no significant disease. 3. The left circumflex is a large vessel which is codominant and gives rise to  two obtuse marginal branches as well as a PDA. The AV groove circumflex  has no significant disease. The first and second OMs are medium sized  vessels with no significant disease. The third OM is a medium sized vessel  with no significant disease. 4. The right coronary artery is a medium sized vessel which is codominant and  gives rise to a small PDA and posterolateral branch.  A left ventriculogram reveals mild global hypokinesis with an estimated EF of 45-50%.  Descending aortogram reveals a normal descending aorta with no evidence of renal artery stenosis.  HEMODYNAMIC DATA: Systemic arterial pressure 145/83, LV systemic pressure 145/12. LVEDP of 18.  Following cardiac catheterization, the Perclose device was used to successfully close the right femoral artery without complications. One gram of Ancef was given prophylactically.  CONCLUSIONS: 1. No significant CAD. 2. Mildly depressed LV systolic function. 3. Systemic hypertension.  Assessment & Plan    1. Chest Pain concerning for Unstable Angina - developed a crushing CP yesterday evening when carrying luggage up the stairs in her home. Associated with nausea and diaphoresis. No repeat symptoms since. Does report worsening dyspnea with exertion for the past several months when climbing stairs in her 3-story townhouse.  - troponin values mildly elevated at 0.04. EKG without acute ischemic changes. Last cath was 16 years ago and showed mild CAD. - with her worsening DOE and  presenting chest pain, I recommend a cardiac catheterization. The patient is in favor of this as well for she wants a definitive answer. Also, with her being on Flecainide, it is important to know her CAD burden as well. The patient understands that risks included but are not limited to stroke (1 in 1000), death (1 in 30), kidney failure [usually temporary] (1 in 500), bleeding (1 in 200), allergic reaction [possibly serious] (1 in 200). Her cardiac catheterization has been scheduled for tomorrow with Dr. Fletcher Anon. Last dose of Xarelto was AM dose on 03/15/2016 so she would have exceeded a  36-hr wash out period by the time of her cath. Continue bridging with Heparin.  2. PAF - currently in atrial fibrillation, rate-controlled in the 80's. - This patients CHA2DS2-VASc Score and unadjusted Ischemic Stroke Rate (% per year) is equal to 7.2 % stroke rate/year from a score of 5 (Age, Female, HTN, CVA(2)). Xarelto held in anticipation of cath (last dose was AM hours of 03/15/2016). Appreciate Pharmacy's assistance with bridging of Heparin. - continue Flecainide foe now. If significant CAD noted on cath, would need to d/c. There has been discussion by Truitt Merle in 09/2015, that if she remained out of rhythm they could consider stopping her Flecainide.  3. HLD - intolerant to statins. Continue Fenofibrate.  4. HTN - ACE-I held in the setting of AKI. Would resume following cath and normalization of her creatinine. - currently well-controlled at 130/69.   5. Acute Kidney Injury - creatinine 1.35 on admission, was 0.82 five months ago. - ACE-I held. Continue to monitor following her catheterization.  Signed, Erma Heritage, PA-C 03/16/2016, 10:20 AM Pager: [phone]  I have personally seen and examined this patient with Bernerd Pho, PA-C. I agree with the assessment and plan as outlined above. She is admitted with unstable angina. She is known to have CAD. She has persistent atrial fib.  Xarelto has been held. My exam shows normal appearing white female, irreg irreg with no loud murmurs, clear lungs, no LE edema. Labs reviewed. EKG without ischemic changes (reviewed by me). Will plan cardiac cath tomorrow with possible PCI. (no room on schedule today for elective cases). NPO at midnight.   Alexandra Pierce 03/16/2016 11:42 AM

## 2016-03-16 NOTE — Progress Notes (Signed)
Pt admitted for evaluation of CP, came after midnight, please see earlier admission note by Dr. Raliegh Ip. Plan cath for AM.   Faye Ramsay, MD  [facility] Pager [phone]  If 7PM-7AM, please contact night-coverage [email or web address] Password [email or web address]

## 2016-03-16 NOTE — Progress Notes (Signed)
ANTICOAGULATION CONSULT NOTE - Initial Consult  Pharmacy Consult for Heparin Indication: atrial fibrillation  Allergies  Allergen Reactions  . Sulfonamide Derivatives Anaphylaxis  . Lipitor [Atorvastatin] Other (See Comments)    Caused bladder infection  . Pravachol [Pravastatin Sodium] Other (See Comments)    Myalgias  . Statins Other (See Comments)    Myalgia    Patient Measurements: Height: 5\' 6"  (167.6 cm) Weight: 173 lb (78.472 kg) IBW/kg (Calculated) : 59.3 Heparin Dosing Weight: 75 kg  Vital Signs: Temp: 97.3 F (36.3 C) (05/29 2239) Temp Source: Oral (05/29 2239) BP: 131/84 mmHg (05/30 0203) Pulse Rate: 79 (05/30 0203)  Labs:  Recent Labs  03/15/16 2245  HGB 13.9  HCT 40.6  PLT 400  CREATININE 1.35*    Estimated Creatinine Clearance: 40.4 mL/min (by C-G formula based on Cr of 1.35).   Medical History: Past Medical History  Diagnosis Date  . Stroke [facility]) 2001 & 2015  . Hypercholesteremia   . Hypertension     mild  . Obesity   . Paroxysmal atrial fibrillation (HCC)     managed with anticoagulation and rate control.   . Atrial fibrillation ([place])   . Chronic anticoagulation     Medications:   (Not in a hospital admission) Scheduled:  . aspirin EC  325 mg Oral Daily  . cholecalciferol  1,000 Units Oral Daily  . fenofibrate  160 mg Oral Daily  . flecainide  100 mg Oral BID   Infusions:  . sodium chloride      Assessment: 70yo female with history of Afib on xarelto PTA presents with palpitations and CP. Pharmacy is consulted to dose heparin for atrial fibrillation. Pt last took xarelto 5/29 around 1000. Will start heparin at 1000 today. Baseline aPTT/HL have been ordered.  Goal of Therapy:  Heparin level 0.3-0.7 units/ml aPTT 66-102 seconds Monitor platelets by anticoagulation protocol: Yes   Plan:  Start heparin infusion at 1100 units/hr Check anti-Xa level in 8 hours and daily while on heparin Continue to monitor H&H and  platelets  Andrey Cota. Diona Foley, PharmD, BCPS Clinical Pharmacist Pager [phone] 03/16/2016,2:10 AM

## 2016-03-16 NOTE — H&P (Signed)
History and Physical    Alexandra Pierce [id number] DOB: 04/15/1946 DOA: 03/15/2016  PCP: Antony Blackbird, MD  Patient coming from: Home.  Chief Complaint: Chest pain.  HPI: Alexandra Pierce is a 70 y.o. female with medical history significant of chronic atrial fibrillation, previous stroke, hypertension and hyperlipidemia presents to the ER because of chest pain. Patient started developing chest pain last night around 9:45 PM while at home. Chest pain was across the chest with palpitations. Had diaphoresis and nausea. Denies any vomiting. Chest pain got better after patient came to the ER without any intervention. EKG shows A. fib controlled rate. Patient states she did not miss her medications. Cardiac markers are negative chest x-ray does not show anything acute and patient is chest pain-free at this time. Patient is admitted for further workup for chest pain. Patient states the chest pain was like a pressure-like across the chest.   ED Course: EKG was showing A. fib rate controlled chest x-ray unremarkable.  Review of Systems: As per HPI otherwise 10 point review of systems negative.    Past Medical History  Diagnosis Date  . Stroke [facility]) 2001 & 2015  . Hypercholesteremia   . Hypertension     mild  . Obesity   . Paroxysmal atrial fibrillation (HCC)     managed with anticoagulation and rate control.   . Atrial fibrillation ([place])   . Chronic anticoagulation     Past Surgical History  Procedure Laterality Date  . Cardiac catheterization  03/31/2000    EF 49%/LAD lg vessel which coursed to the apex & gave rise to 2 diagonal branches/ LAD noted to have 30% ostial lesion & tapers to a sm vessel toward the apex but no high grade lesion/1st diagonal med sized vessel & 2nd diagonal sm vessel with no significant disease/ lt ventriculogram reveals mild global hypokinesis w/ an estimated EF of 45-50%     reports that she quit smoking about 37 years ago. Her smoking use included Cigarettes. She  smoked 0.00 packs per day for 0 years. She has never used smokeless tobacco. She reports that she drinks alcohol. She reports that she does not use illicit drugs.  Allergies  Allergen Reactions  . Sulfonamide Derivatives Anaphylaxis  . Lipitor [Atorvastatin] Other (See Comments)    Caused bladder infection  . Pravachol [Pravastatin Sodium] Other (See Comments)    Myalgias  . Statins Other (See Comments)    Myalgia    Family History  Problem Relation Age of Onset  . Heart disease Father   . Asthma Father   . Diabetes Father   . Heart disease Mother   . Heart disease Brother   . Hyperlipidemia Sister   . Hyperlipidemia Brother   . Hyperlipidemia Brother     Prior to Admission medications   Medication Sig Start Date End Date Taking? Authorizing Provider  acetaminophen (TYLENOL) 500 MG tablet Take 1,000 mg by mouth as needed for mild pain or headache.    Yes Historical Provider, MD  cholecalciferol (VITAMIN D) 1000 UNITS tablet Take 1,000 Units by mouth daily.   Yes Historical Provider, MD  fenofibrate 160 MG tablet TAKE ONE TABLET BY MOUTH ONCE DAILY 05/08/15  Yes Burtis Junes, NP  flecainide (TAMBOCOR) 100 MG tablet TAKE ONE TABLET BY MOUTH TWICE DAILY 10/22/15  Yes Burtis Junes, NP  ramipril (ALTACE) 2.5 MG capsule TAKE 1 CAPSULE BY MOUTH 3 TIMES TIMES DAILY. TAKE 2 CAPSULES IN THE MORNING, AND 1 CAPSULE IN THE  EVENING Patient taking differently: TAKE 2 CAPSULES IN THE MORNING, AND 1 CAPSULE IN THE EVENING 10/22/15  Yes Burtis Junes, NP  XARELTO 20 MG TABS tablet TAKE ONE TABLET BY MOUTH ONE TIME DAILY 01/28/16  Yes Thompson Grayer, MD    Physical Exam: Filed Vitals:   03/15/16 2239 03/16/16 0050 03/16/16 0052 03/16/16 0203  BP: 149/85 128/94  131/84  Pulse: 91 89  79  Temp: 97.3 F (36.3 C)     TempSrc: Oral     Resp: 22 20  18   Height: 5\' 6"  (1.676 m)     Weight: 173 lb (78.472 kg)     SpO2: 100% 99% 99% 96%      Constitutional: Not in distress. Filed Vitals:    03/15/16 2239 03/16/16 0050 03/16/16 0052 03/16/16 0203  BP: 149/85 128/94  131/84  Pulse: 91 89  79  Temp: 97.3 F (36.3 C)     TempSrc: Oral     Resp: 22 20  18   Height: 5\' 6"  (1.676 m)     Weight: 173 lb (78.472 kg)     SpO2: 100% 99% 99% 96%   Eyes: Anicteric no pallor. ENMT: No discharge from the ears eyes nose and mouth. Neck: No mass felt. No JVD appreciated. Respiratory: No rhonchi or crepitations. Cardiovascular: S1-S2 heard. Abdomen: Soft nontender bowel sounds present. Musculoskeletal: No edema. Skin: No rash. Neurologic: Alert awake oriented to time place and person. Moves all extremities. Psychiatric: Appears normal.   Labs on Admission: I have personally reviewed following labs and imaging studies  CBC:  Recent Labs Lab 03/15/16 2245  WBC 5.2  HGB 13.9  HCT 40.6  MCV 97.8  PLT [id number]   Basic Metabolic Panel:  Recent Labs Lab 03/15/16 2245  NA 139  K 3.2*  CL 104  CO2 25  GLUCOSE 140*  BUN 23*  CREATININE 1.35*  CALCIUM 9.5   GFR: Estimated Creatinine Clearance: 40.4 mL/min (by C-G formula based on Cr of 1.35). Liver Function Tests: No results for input(s): AST, ALT, ALKPHOS, BILITOT, PROT, ALBUMIN in the last 168 hours. No results for input(s): LIPASE, AMYLASE in the last 168 hours. No results for input(s): AMMONIA in the last 168 hours. Coagulation Profile: No results for input(s): INR, PROTIME in the last 168 hours. Cardiac Enzymes: No results for input(s): CKTOTAL, CKMB, CKMBINDEX, TROPONINI in the last 168 hours. BNP (last 3 results) No results for input(s): PROBNP in the last 8760 hours. HbA1C: No results for input(s): HGBA1C in the last 72 hours. CBG: No results for input(s): GLUCAP in the last 168 hours. Lipid Profile: No results for input(s): CHOL, HDL, LDLCALC, TRIG, CHOLHDL, LDLDIRECT in the last 72 hours. Thyroid Function Tests: No results for input(s): TSH, T4TOTAL, FREET4, T3FREE, THYROIDAB in the last 72 hours. Anemia  Panel: No results for input(s): VITAMINB12, FOLATE, FERRITIN, TIBC, IRON, RETICCTPCT in the last 72 hours. Urine analysis:    Component Value Date/Time   COLORURINE YELLOW 01/11/2014 1200   APPEARANCEUR CLEAR 01/11/2014 1200   LABSPEC 1.009 01/11/2014 1200   PHURINE 7.0 01/11/2014 1200   GLUCOSEU NEGATIVE 01/11/2014 1200   HGBUR NEGATIVE 01/11/2014 1200   BILIRUBINUR NEGATIVE 01/11/2014 1200   KETONESUR NEGATIVE 01/11/2014 1200   PROTEINUR NEGATIVE 01/11/2014 1200   UROBILINOGEN 0.2 01/11/2014 1200   NITRITE NEGATIVE 01/11/2014 1200   LEUKOCYTESUR NEGATIVE 01/11/2014 1200   Sepsis Labs: @LABRCNTIP (procalcitonin:4,lacticidven:4) )No results found for this or any previous visit (from the past 240 hour(s)).   Radiological Exams  on Admission: Dg Chest 2 View  03/15/2016  CLINICAL DATA:  Acute onset of bilateral chest pain. Initial encounter. EXAM: CHEST  2 VIEW COMPARISON:  None. FINDINGS: The lungs are well-aerated and clear. There is no evidence of focal opacification, pleural effusion or pneumothorax. The heart is normal in size; the mediastinal contour is within normal limits. No acute osseous abnormalities are seen. IMPRESSION: No acute cardiopulmonary process seen. Electronically Signed   By: Garald Balding M.D.   On: 03/15/2016 23:03    EKG: Independently reviewed. A. fib with controlled rate.  Assessment/Plan Principal Problem:   Chest pain Active Problems:   Essential hypertension   Atrial fibrillation (HCC)   ARF (acute renal failure) ([place])    #1. Chest pain - patient also had previous episodes of palpitation during chest pain episode. May be precipitating the chest pain. At this time we will cycle cardiac markers check 2-D echo keep nothing by mouth in a.m. and anticipation of cardiac procedures. In anticipation of possible cardiac procedures I will follow patient xarelto keep patient on heparin. Last dose of Xarelto was yesterday morning. #2. Chronic atrial fibrillation  - chads 2 vasc score is 5. Patient is on flecainide which will be continued. Patient is on heparin infusion instead of Xarelto in anticipation of cardiac procedure. #3. Acute renal failure - gently hydrate and hold ramipril for now. Closely follow metabolic panel. Check UA. #4. Hypertension - ramipril on hold secondary to renal failure. Closely follow blood pressure trends when necessary IV hydralazine. #5. History of stroke. #6. History of hyperlipidemia on fenofibrate.   DVT prophylaxis: Heparin infusion. Code Status: Full code.  Family Communication: No family at the bedside.  Disposition Plan: Home.  Consults called: Cardiology consults through email.  Admission status: Observation. Telemetry.    Rise Patience MD [facility] Pager [phone].  If 7PM-7AM, please contact night-coverage [email or web address] Password [email or web address]  03/16/2016, 2:06 AM

## 2016-03-16 NOTE — Progress Notes (Signed)
ANTICOAGULATION CONSULT NOTE - Follow Up Consult  Pharmacy Consult for heparin Indication: atrial fibrillation  Allergies  Allergen Reactions  . Sulfonamide Derivatives Anaphylaxis  . Lipitor [Atorvastatin] Other (See Comments)    Caused bladder infection  . Pravachol [Pravastatin Sodium] Other (See Comments)    Myalgias  . Statins Other (See Comments)    Myalgia    Patient Measurements: Height: 5\' 6"  (167.6 cm) Weight: 175 lb 14.4 oz (79.788 kg) IBW/kg (Calculated) : 59.3 Heparin Dosing Weight: 75 kg  Vital Signs: Temp: 98.1 F (36.7 C) (05/30 1318) Temp Source: Oral (05/30 1318) BP: 115/62 mmHg (05/30 1318) Pulse Rate: 91 (05/30 1318)  Labs:  Recent Labs  03/15/16 2245 03/16/16 0240 03/16/16 0351 03/16/16 0810 03/16/16 1351 03/16/16 1807  HGB 13.9  --   --   --   --   --   HCT 40.6  --   --   --   --   --   PLT 400  --   --   --   --   --   APTT  --   --  43*  --   --  81*  HEPARINUNFRC  --   --  2.00*  --   --   --   CREATININE 1.35*  --   --   --   --   --   TROPONINI  --  0.04*  --  0.04* 0.04*  --     Estimated Creatinine Clearance: 40.7 mL/min (by C-G formula based on Cr of 1.35).   Medications:  Infusions:  . sodium chloride 100 mL/hr at 03/16/16 0326  . heparin 1,100 Units/hr (03/16/16 1053)    Assessment: 70 y/o female admitted with CP concerning for unstable angina. She takes Xarelto at home for Afib which is being held. She continues on a heparin bridge. Plan is for cath after Xarelto washout. Last Xarelto 5/29 ~10:00. Heparin drip will be adjusted based on aPTT until aPTT and HL correlate.  aPTT is therapeutic at 81 sec. No bleeding noted.  Goal of Therapy:  Heparin level 0.3-0.7 units/ml aPTT 66-102 seconds Monitor platelets by anticoagulation protocol: Yes   Plan:  - Continue heparin drip at 1100 units/hr - Confirmatory HL in am - Daily HL, CBC - Monitor for s/sx of bleeding  [facility], [place].D., BCPS Clinical  Pharmacist Pager: [phone] 03/16/2016 7:51 PM

## 2016-03-17 ENCOUNTER — Encounter (HOSPITAL_COMMUNITY)
Admission: EM | Disposition: A | Discharge status: Home / Self Care | Payer: Self-pay | Source: Home / Self Care | Attending: Emergency Medicine

## 2016-03-17 ENCOUNTER — Ambulatory Visit (HOSPITAL_BASED_OUTPATIENT_CLINIC_OR_DEPARTMENT_OTHER): Payer: Medicare Other

## 2016-03-17 ENCOUNTER — Encounter (HOSPITAL_COMMUNITY): Payer: Self-pay | Admitting: Cardiovascular Disease

## 2016-03-17 DIAGNOSIS — I25110 Atherosclerotic heart disease of native coronary artery with unstable angina pectoris: Secondary | ICD-10-CM | POA: Diagnosis not present

## 2016-03-17 DIAGNOSIS — I480 Paroxysmal atrial fibrillation: Secondary | ICD-10-CM

## 2016-03-17 DIAGNOSIS — R079 Chest pain, unspecified: Secondary | ICD-10-CM | POA: Diagnosis not present

## 2016-03-17 DIAGNOSIS — R0789 Other chest pain: Secondary | ICD-10-CM | POA: Diagnosis not present

## 2016-03-17 DIAGNOSIS — I10 Essential (primary) hypertension: Secondary | ICD-10-CM | POA: Diagnosis not present

## 2016-03-17 DIAGNOSIS — I200 Unstable angina: Secondary | ICD-10-CM | POA: Diagnosis not present

## 2016-03-17 HISTORY — PX: CARDIAC CATHETERIZATION: SHX172

## 2016-03-17 LAB — PROTIME-INR
INR: 1.4 (ref 0.00–1.49)
Prothrombin Time: 17.3 seconds — ABNORMAL HIGH (ref 11.6–15.2)

## 2016-03-17 LAB — BASIC METABOLIC PANEL
Anion gap: 6 (ref 5–15)
BUN: 12 mg/dL (ref 6–20)
CHLORIDE: 110 mmol/L (ref 101–111)
CO2: 25 mmol/L (ref 22–32)
CREATININE: 0.9 mg/dL (ref 0.44–1.00)
Calcium: 8.2 mg/dL — ABNORMAL LOW (ref 8.9–10.3)
GFR calc Af Amer: >60 mL/min (ref >60)
GFR calc non Af Amer: >60 mL/min (ref >60)
GLUCOSE: 107 mg/dL — AB (ref 65–99)
Potassium: 3.8 mmol/L (ref 3.5–5.1)
Sodium: 141 mmol/L (ref 135–145)

## 2016-03-17 LAB — HEPARIN LEVEL (UNFRACTIONATED): Heparin Unfractionated: 1.06 IU/mL — ABNORMAL HIGH (ref 0.30–0.70)

## 2016-03-17 LAB — LIPID PANEL
CHOL/HDL RATIO: 5.3 ratio
Cholesterol: 208 mg/dL — ABNORMAL HIGH (ref 0–200)
HDL: 39 mg/dL — AB (ref >40)
LDL Cholesterol: 150 mg/dL — ABNORMAL HIGH (ref 0–99)
TRIGLYCERIDES: 94 mg/dL (ref <150)
VLDL: 19 mg/dL (ref 0–40)

## 2016-03-17 LAB — ECHOCARDIOGRAM COMPLETE
HEIGHTINCHES: 66 in
WEIGHTICAEL: 2851.2 [oz_av]

## 2016-03-17 LAB — APTT: APTT: 123 s — AB (ref 24–37)

## 2016-03-17 SURGERY — LEFT HEART CATH AND CORONARY ANGIOGRAPHY
Anesthesia: LOCAL

## 2016-03-17 MED ORDER — LIDOCAINE HCL (PF) 1 % IJ SOLN
INTRAMUSCULAR | Status: AC
  Filled 2016-03-17: qty 30

## 2016-03-17 MED ORDER — HEPARIN SODIUM (PORCINE) 1000 UNIT/ML IJ SOLN
INTRAMUSCULAR | Status: AC
  Filled 2016-03-17: qty 1

## 2016-03-17 MED ORDER — SODIUM CHLORIDE 0.9% FLUSH
3.0000 mL | Freq: Two times a day (BID) | INTRAVENOUS | Status: DC
  Administered 2016-03-17 – 2016-03-18 (×3): 3 mL via INTRAVENOUS

## 2016-03-17 MED ORDER — FENTANYL CITRATE (PF) 100 MCG/2ML IJ SOLN
INTRAMUSCULAR | Status: AC
  Filled 2016-03-17: qty 2

## 2016-03-17 MED ORDER — FENTANYL CITRATE (PF) 100 MCG/2ML IJ SOLN
INTRAMUSCULAR | Status: DC | PRN
  Administered 2016-03-17: 25 ug via INTRAVENOUS

## 2016-03-17 MED ORDER — IOPAMIDOL (ISOVUE-370) INJECTION 76%
INTRAVENOUS | Status: DC | PRN
  Administered 2016-03-17: 50 mL via INTRA_ARTERIAL

## 2016-03-17 MED ORDER — RAMIPRIL 2.5 MG PO CAPS
2.5000 mg | ORAL_CAPSULE | Freq: Two times a day (BID) | ORAL | Status: DC
  Administered 2016-03-17 – 2016-03-18 (×3): 2.5 mg via ORAL
  Filled 2016-03-17 (×3): qty 1

## 2016-03-17 MED ORDER — RIVAROXABAN 20 MG PO TABS: 20.0000 mg | ORAL_TABLET | Freq: Every day | ORAL | Status: DC

## 2016-03-17 MED ORDER — MIDAZOLAM HCL 2 MG/2ML IJ SOLN
INTRAMUSCULAR | Status: AC
  Filled 2016-03-17: qty 2

## 2016-03-17 MED ORDER — SODIUM CHLORIDE 0.9 % IV SOLN: 250.0000 mL | INTRAVENOUS | Status: DC | PRN

## 2016-03-17 MED ORDER — RIVAROXABAN 20 MG PO TABS
20.0000 mg | ORAL_TABLET | Freq: Every day | ORAL | Status: DC
  Administered 2016-03-18: 20 mg via ORAL
  Filled 2016-03-17: qty 1

## 2016-03-17 MED ORDER — SODIUM CHLORIDE 0.9% FLUSH: 3.0000 mL | INTRAVENOUS | Status: DC | PRN

## 2016-03-17 MED ORDER — SODIUM CHLORIDE 0.9 % IV SOLN: INTRAVENOUS | Status: DC

## 2016-03-17 MED ORDER — VERAPAMIL HCL 2.5 MG/ML IV SOLN
INTRAVENOUS | Status: AC
  Filled 2016-03-17: qty 2

## 2016-03-17 MED ORDER — LIDOCAINE HCL (PF) 1 % IJ SOLN
INTRAMUSCULAR | Status: DC | PRN
  Administered 2016-03-17: 09:00:00

## 2016-03-17 MED ORDER — MIDAZOLAM HCL 2 MG/2ML IJ SOLN
INTRAMUSCULAR | Status: DC | PRN
  Administered 2016-03-17: 1 mg via INTRAVENOUS

## 2016-03-17 MED ORDER — METOPROLOL TARTRATE 25 MG PO TABS
25.0000 mg | ORAL_TABLET | Freq: Two times a day (BID) | ORAL | Status: DC
  Administered 2016-03-17 – 2016-03-18 (×3): 25 mg via ORAL
  Filled 2016-03-17 (×3): qty 1

## 2016-03-17 MED ORDER — HEPARIN (PORCINE) IN NACL 2-0.9 UNIT/ML-% IJ SOLN
INTRAMUSCULAR | Status: AC
  Filled 2016-03-17: qty 1500

## 2016-03-17 MED ORDER — IOPAMIDOL (ISOVUE-370) INJECTION 76%
INTRAVENOUS | Status: AC
  Filled 2016-03-17: qty 100

## 2016-03-17 MED ORDER — VERAPAMIL HCL 2.5 MG/ML IV SOLN
INTRAVENOUS | Status: DC | PRN
  Administered 2016-03-17: 10 mL via INTRA_ARTERIAL

## 2016-03-17 MED ORDER — HEPARIN SODIUM (PORCINE) 1000 UNIT/ML IJ SOLN
INTRAMUSCULAR | Status: DC | PRN
  Administered 2016-03-17: 4000 [IU] via INTRAVENOUS

## 2016-03-17 SURGICAL SUPPLY — 11 items
CATH INFINITI 5FR ANG PIGTAIL (CATHETERS) ×2 IMPLANT
CATH OPTITORQUE JACKY 4.0 5F (CATHETERS) ×2 IMPLANT
DEVICE RAD COMP TR BAND LRG (VASCULAR PRODUCTS) ×2 IMPLANT
GLIDESHEATH SLEND SS 6F .021 (SHEATH) ×2 IMPLANT
KIT HEART LEFT (KITS) ×2 IMPLANT
PACK CARDIAC CATHETERIZATION (CUSTOM PROCEDURE TRAY) ×2 IMPLANT
SYR MEDRAD MARK V 150ML (SYRINGE) ×2 IMPLANT
TRANSDUCER W/STOPCOCK (MISCELLANEOUS) ×2 IMPLANT
TUBING CIL FLEX 10 FLL-RA (TUBING) ×2 IMPLANT
WIRE HI TORQ VERSACORE-J 145CM (WIRE) ×2 IMPLANT
WIRE SAFE-T 1.5MM-J .035X260CM (WIRE) ×2 IMPLANT

## 2016-03-17 NOTE — Progress Notes (Signed)
Pt wondering if she will hear from cardiology tonight regarding her cath/echo results from earlier today.  Dr. Angelena Form paged, he will likely not be able to see her tonight as he is still in cath lab.  He will be in tomorrow morning to discuss results/detailed plan for care.  Patient is aware and agreeable.

## 2016-03-17 NOTE — Progress Notes (Signed)
     SUBJECTIVE: No chest pain this am.   BP 112/64 mmHg  Pulse 64  Temp(Src) 97.7 F (36.5 C) (Oral)  Resp 18  Ht 5\' 6"  (1.676 m)  Wt 178 lb 3.2 oz (80.831 kg)  BMI 28.78 kg/m2  SpO2 98%  Intake/Output Summary (Last 24 hours) at 03/17/16 0655 Last data filed at 03/17/16 0555  Gross per 24 hour  Intake    480 ml  Output   1500 ml  Net  -1020 ml    PHYSICAL EXAM General: Well developed, well nourished, in no acute distress. Alert and oriented x 3.  Psych:  Good affect, responds appropriately Neck: No JVD. No masses noted.  Lungs: Clear bilaterally with no wheezes or rhonci noted.  Heart: RRR with no murmurs noted. Abdomen: Bowel sounds are present. Soft, non-tender.  Extremities: No lower extremity edema.   LABS: Basic Metabolic Panel:  Recent Labs  03/15/16 2245 03/17/16 0220  NA 139 141  K 3.2* 3.8  CL 104 110  CO2 25 25  GLUCOSE 140* 107*  BUN 23* 12  CREATININE 1.35* 0.90  CALCIUM 9.5 8.2*   CBC:  Recent Labs  03/15/16 2245  WBC 5.2  HGB 13.9  HCT 40.6  MCV 97.8  PLT 400   Cardiac Enzymes:  Recent Labs  03/16/16 0240 03/16/16 0810 03/16/16 1351  TROPONINI 0.04* 0.04* 0.04*    Current Meds: . aspirin EC  325 mg Oral Daily  . cholecalciferol  1,000 Units Oral Daily  . fenofibrate  160 mg Oral Daily  . flecainide  100 mg Oral BID  . sodium chloride flush  3 mL Intravenous Q12H     ASSESSMENT AND PLAN:  1. Unstable angina: Pt admitted with chest pain worrisome for unstable angina. Troponin 0.04 with flat trend. Xarelto on hold for cardiac cath today. Labs reviewed this am and ok. She has no questions.   2. PAF: currently in atrial fibrillation, rate-controlled in the 80's. This patients CHA2DS2-VASc Score and unadjusted Ischemic Stroke Rate (% per year) is equal to 7.2 % stroke rate/year from a score of 5 (Age, Female, HTN, CVA(2)). Xarelto held in anticipation of cath (last dose was AM hours of 03/15/2016). She is on IV heparin awaiting  cath today. Continue Flecainide for now. If significant CAD noted on cath, would need to d/c.   3. HLD: intolerant to statins. Continue Fenofibrate.  4. HTN: ACE-I held in the setting of AKI. Renal function normal this am. Would resume following cath. BP controlled this am.    Lauree Chandler  5/31/20176:55 AM

## 2016-03-17 NOTE — Interval H&P Note (Signed)
Cath Lab Visit (complete for each Cath Lab visit)  Clinical Evaluation Leading to the Procedure:   ACS: Yes.    Non-ACS:    Anginal Classification: CCS IV  Anti-ischemic medical therapy: Minimal Therapy (1 class of medications)  Non-Invasive Test Results: No non-invasive testing performed  Prior CABG: No previous CABG      History and Physical Interval Note:  03/17/2016 8:24 AM  Alexandra Pierce  has presented today for surgery, with the diagnosis of cp  The various methods of treatment have been discussed with the patient and family. After consideration of risks, benefits and other options for treatment, the patient has consented to  Procedure(s): Left Heart Cath and Coronary Angiography (N/A) as a surgical intervention .  The patient's history has been reviewed, patient examined, no change in status, stable for surgery.  I have reviewed the patient's chart and labs.  Questions were answered to the patient's satisfaction.     Alexandra Pierce

## 2016-03-17 NOTE — Research (Signed)
LEADERS FREE Informed Consent   Subject Name: Alexandra Pierce  Subject met inclusion and exclusion criteria.  The informed consent form, study requirements and expectations were reviewed with the subject and questions and concerns were addressed prior to the signing of the consent form.  The subject verbalized understanding of the trial requirements.  The subject agreed to participate in the  trial and signed the informed consent.  The informed consent was obtained prior to performance of any protocol-specific procedures for the subject.  A copy of the signed informed consent was given to the subject and a copy was placed in the subject's medical record. Subject will be enrolled if the angiographic criteria is met.  Blossom Hoops 03/17/2016, 9:35 AM

## 2016-03-17 NOTE — Progress Notes (Signed)
Patient ID: Alexandra Pierce, female   DOB: Feb 16, 1946, 70 y.o.   MRN: [id number]    PROGRESS NOTE    Alexandra Pierce  [id number] DOB: 09-16-1946 DOA: 03/15/2016  PCP: Antony Blackbird, MD   Brief Narrative:  70 y.o. female with known chronic atrial fibrillation, previous stroke, hypertension and hyperlipidemia presents to the ER because of chest pain. Patient started developing chest pain last night around 9:45 PM while at home. Chest pain was across the chest with palpitations. Had diaphoresis and nausea. Denies any vomiting. Chest pain got better after patient came to the ER without any intervention. EKG shows A. fib controlled rate. Patient states she did not miss her medications. Cardiac markers are negative chest x-ray does not show anything acute and patient is chest pain-free at this time. Patient is admitted for further workup for chest pain. Patient states the chest pain was like a pressure-like across the chest.   Assessment & Plan:   Principal Problem: Unstable angina - pt on Xarelto at home but held here piror to cath - cath done today, will follow upon results - further management and recommendations pending cardiac cath - appreciate cardiology assistance   PAF, CHADS2vasc score 5 - currently in atrial fibrillation, rate 60 - 80's - keep on telemetry, continue to follow up on cardiology team recommendations   HLD - intolerant to statins. Continue Fenofibrate. - lipid panel requested   HTN, essential - plan to resume ACEI today    ARF (acute renal failure) (HCC) - pre renal in etiology - Cr is now WNL   DVT prophylaxis: on Xarelto  Code Status: Full Family Communication: Patient at bedside  Disposition Plan: When cleared by cardiology team   Consultants:   Cardiology   Procedures:   Cardiac cath 5/31 -->  Antimicrobials:   None   Subjective: Pt reports feeling better, still with intermittent anterior chest discomfort but better since  yesterday.  Objective: Filed Vitals:   03/17/16 1115 03/17/16 1145 03/17/16 1215 03/17/16 1245  BP: 107/66 107/63 123/92 109/60  Pulse: 63 57 63 62  Temp:      TempSrc:      Resp: 16 16 16 16   Height:      Weight:      SpO2: 98% 98% 97% 97%    Intake/Output Summary (Last 24 hours) at 03/17/16 1648 Last data filed at 03/17/16 1256  Gross per 24 hour  Intake    240 ml  Output   1100 ml  Net   -860 ml   Filed Weights   03/15/16 2239 03/16/16 0259 03/17/16 0545  Weight: 78.472 kg (173 lb) 79.788 kg (175 lb 14.4 oz) 80.831 kg (178 lb 3.2 oz)    Examination:  General exam: Appears calm and comfortable  Respiratory system: Clear to auscultation. Respiratory effort normal. Cardiovascular system: IRRR. No JVD, rubs, gallops or clicks. No pedal edema. Gastrointestinal system: Abdomen is nondistended, soft and nontender.  Central nervous system: Alert and oriented. No focal neurological deficits. Extremities: Symmetric 5 x 5 power.  Data Reviewed: I have personally reviewed following labs and imaging studies  CBC:  Recent Labs Lab 03/15/16 2245  WBC 5.2  HGB 13.9  HCT 40.6  MCV 97.8  PLT [id number]   Basic Metabolic Panel:  Recent Labs Lab 03/15/16 2245 03/17/16 0220  NA 139 141  K 3.2* 3.8  CL 104 110  CO2 25 25  GLUCOSE 140* 107*  BUN 23* 12  CREATININE 1.35* 0.90  CALCIUM 9.5  8.2*   Coagulation Profile:  Recent Labs Lab 03/17/16 0220  INR 1.40   Cardiac Enzymes:  Recent Labs Lab 03/16/16 0240 03/16/16 0810 03/16/16 1351  TROPONINI 0.04* 0.04* 0.04*   Urine analysis:    Component Value Date/Time   COLORURINE YELLOW 03/16/2016 2128   APPEARANCEUR CLEAR 03/16/2016 2128   LABSPEC 1.015 03/16/2016 2128   PHURINE 6.0 03/16/2016 2128   GLUCOSEU NEGATIVE 03/16/2016 2128   HGBUR NEGATIVE 03/16/2016 2128   BILIRUBINUR NEGATIVE 03/16/2016 2128   KETONESUR NEGATIVE 03/16/2016 2128   PROTEINUR NEGATIVE 03/16/2016 2128   UROBILINOGEN 0.2 01/11/2014 1200    NITRITE NEGATIVE 03/16/2016 2128   LEUKOCYTESUR SMALL* 03/16/2016 2128    Recent Results (from the past 240 hour(s))  Surgical pcr screen     Status: None   Collection Time: 03/16/16  8:40 PM  Result Value Ref Range Status   MRSA, PCR NEGATIVE NEGATIVE Final   Staphylococcus aureus NEGATIVE NEGATIVE Final    Comment:        The Xpert SA Assay (FDA approved for NASAL specimens in patients over 2 years of age), is one component of a comprehensive surveillance program.  Test performance has been validated by [facility] for patients greater than or equal to 19 year old. It is not intended to diagnose infection nor to guide or monitor treatment.       Radiology Studies: Dg Chest 2 View  03/15/2016  CLINICAL DATA:  Acute onset of bilateral chest pain. Initial encounter. EXAM: CHEST  2 VIEW COMPARISON:  None. FINDINGS: The lungs are well-aerated and clear. There is no evidence of focal opacification, pleural effusion or pneumothorax. The heart is normal in size; the mediastinal contour is within normal limits. No acute osseous abnormalities are seen. IMPRESSION: No acute cardiopulmonary process seen. Electronically Signed   By: Garald Balding M.D.   On: 03/15/2016 23:03      Scheduled Meds: . cholecalciferol  1,000 Units Oral Daily  . fenofibrate  160 mg Oral Daily  . metoprolol tartrate  25 mg Oral BID  . ramipril  2.5 mg Oral BID  . [START ON 03/18/2016] rivaroxaban  20 mg Oral Daily  . sodium chloride flush  3 mL Intravenous Q12H   Continuous Infusions:       Time spent: 20 minutes    Faye Ramsay, MD [facility] Pager [phone]  If 7PM-7AM, please contact night-coverage [email or web address] Password [facility] 03/17/2016, 4:48 PM

## 2016-03-17 NOTE — Progress Notes (Signed)
  Echocardiogram 2D Echocardiogram has been performed.  Alexandra Pierce 03/17/2016, 3:22 PM

## 2016-03-17 NOTE — H&P (View-Only) (Signed)
     SUBJECTIVE: No chest pain this am.   BP 112/64 mmHg  Pulse 64  Temp(Src) 97.7 F (36.5 C) (Oral)  Resp 18  Ht 5\' 6"  (1.676 m)  Wt 178 lb 3.2 oz (80.831 kg)  BMI 28.78 kg/m2  SpO2 98%  Intake/Output Summary (Last 24 hours) at 03/17/16 0655 Last data filed at 03/17/16 0555  Gross per 24 hour  Intake    480 ml  Output   1500 ml  Net  -1020 ml    PHYSICAL EXAM General: Well developed, well nourished, in no acute distress. Alert and oriented x 3.  Psych:  Good affect, responds appropriately Neck: No JVD. No masses noted.  Lungs: Clear bilaterally with no wheezes or rhonci noted.  Heart: RRR with no murmurs noted. Abdomen: Bowel sounds are present. Soft, non-tender.  Extremities: No lower extremity edema.   LABS: Basic Metabolic Panel:  Recent Labs  03/15/16 2245 03/17/16 0220  NA 139 141  K 3.2* 3.8  CL 104 110  CO2 25 25  GLUCOSE 140* 107*  BUN 23* 12  CREATININE 1.35* 0.90  CALCIUM 9.5 8.2*   CBC:  Recent Labs  03/15/16 2245  WBC 5.2  HGB 13.9  HCT 40.6  MCV 97.8  PLT 400   Cardiac Enzymes:  Recent Labs  03/16/16 0240 03/16/16 0810 03/16/16 1351  TROPONINI 0.04* 0.04* 0.04*    Current Meds: . aspirin EC  325 mg Oral Daily  . cholecalciferol  1,000 Units Oral Daily  . fenofibrate  160 mg Oral Daily  . flecainide  100 mg Oral BID  . sodium chloride flush  3 mL Intravenous Q12H     ASSESSMENT AND PLAN:  1. Unstable angina: Pt admitted with chest pain worrisome for unstable angina. Troponin 0.04 with flat trend. Xarelto on hold for cardiac cath today. Labs reviewed this am and ok. She has no questions.   2. PAF: currently in atrial fibrillation, rate-controlled in the 80's. This patients CHA2DS2-VASc Score and unadjusted Ischemic Stroke Rate (% per year) is equal to 7.2 % stroke rate/year from a score of 5 (Age, Female, HTN, CVA(2)). Xarelto held in anticipation of cath (last dose was AM hours of 03/15/2016). She is on IV heparin awaiting  cath today. Continue Flecainide for now. If significant CAD noted on cath, would need to d/c.   3. HLD: intolerant to statins. Continue Fenofibrate.  4. HTN: ACE-I held in the setting of AKI. Renal function normal this am. Would resume following cath. BP controlled this am.    Lauree Chandler  5/31/20176:55 AM

## 2016-03-17 NOTE — Progress Notes (Signed)
ANTICOAGULATION CONSULT NOTE - Follow Up Consult  Pharmacy Consult for heparin Indication: atrial fibrillation  Allergies  Allergen Reactions  . Sulfonamide Derivatives Anaphylaxis  . Lipitor [Atorvastatin] Other (See Comments)    Caused bladder infection  . Pravachol [Pravastatin Sodium] Other (See Comments)    Myalgias  . Statins Other (See Comments)    Myalgia    Patient Measurements: Height: 5\' 6"  (167.6 cm) Weight: 175 lb 14.4 oz (79.788 kg) IBW/kg (Calculated) : 59.3 Heparin Dosing Weight: 75 kg  Vital Signs: Temp: 98.4 F (36.9 C) (05/30 2036) Temp Source: Oral (05/30 2036) BP: 128/82 mmHg (05/30 2036) Pulse Rate: 80 (05/30 2036)  Labs:  Recent Labs  03/15/16 2245 03/16/16 0240 03/16/16 0351 03/16/16 0810 03/16/16 1351 03/16/16 1807 03/17/16 0220  HGB 13.9  --   --   --   --   --   --   HCT 40.6  --   --   --   --   --   --   PLT 400  --   --   --   --   --   --   APTT  --   --  43*  --   --  81* 123*  LABPROT  --   --   --   --   --   --  17.3*  INR  --   --   --   --   --   --  1.40  HEPARINUNFRC  --   --  2.00*  --   --   --  1.06*  CREATININE 1.35*  --   --   --   --   --  0.90  TROPONINI  --  0.04*  --  0.04* 0.04*  --   --     Estimated Creatinine Clearance: 61.1 mL/min (by C-G formula based on Cr of 0.9).   Medications:  Infusions:  . sodium chloride     Followed by  . sodium chloride    . heparin 1,100 Units/hr (03/16/16 1053)    Assessment: 70 y.o. female with chest pain, h/o Afib Xarelto on hold, for heparin Goal of Therapy:  Heparin level 0.3-0.7 units/ml aPTT 66-102 seconds Monitor platelets by anticoagulation protocol: Yes   Plan:  Decrease Heparin 950 units/hr F/U after cath  Phillis Knack, PharmD, BCPS   03/17/2016 5:02 AM

## 2016-03-18 DIAGNOSIS — I25119 Atherosclerotic heart disease of native coronary artery with unspecified angina pectoris: Secondary | ICD-10-CM | POA: Diagnosis not present

## 2016-03-18 DIAGNOSIS — R0789 Other chest pain: Secondary | ICD-10-CM

## 2016-03-18 DIAGNOSIS — I480 Paroxysmal atrial fibrillation: Secondary | ICD-10-CM | POA: Diagnosis not present

## 2016-03-18 DIAGNOSIS — I25110 Atherosclerotic heart disease of native coronary artery with unstable angina pectoris: Secondary | ICD-10-CM | POA: Diagnosis not present

## 2016-03-18 LAB — BASIC METABOLIC PANEL
ANION GAP: 6 (ref 5–15)
BUN: 12 mg/dL (ref 6–20)
CALCIUM: 8.7 mg/dL — AB (ref 8.9–10.3)
CO2: 24 mmol/L (ref 22–32)
Chloride: 111 mmol/L (ref 101–111)
Creatinine, Ser: 0.78 mg/dL (ref 0.44–1.00)
GLUCOSE: 93 mg/dL (ref 65–99)
POTASSIUM: 4.2 mmol/L (ref 3.5–5.1)
Sodium: 141 mmol/L (ref 135–145)

## 2016-03-18 LAB — CBC
HEMATOCRIT: 35.1 % — AB (ref 36.0–46.0)
Hemoglobin: 12 g/dL (ref 12.0–15.0)
MCH: 33.7 pg (ref 26.0–34.0)
MCHC: 34.2 g/dL (ref 30.0–36.0)
MCV: 98.6 fL (ref 78.0–100.0)
Platelets: 350 10*3/uL (ref 150–400)
RBC: 3.56 MIL/uL — AB (ref 3.87–5.11)
RDW: 14.7 % (ref 11.5–15.5)
WBC: 4.6 10*3/uL (ref 4.0–10.5)

## 2016-03-18 MED ORDER — METOPROLOL TARTRATE 25 MG PO TABS: 25.0000 mg | ORAL_TABLET | Freq: Two times a day (BID) | ORAL | Status: DC

## 2016-03-18 NOTE — Care Management Obs Status (Signed)
MEDICARE OBSERVATION STATUS NOTIFICATION   Patient Details  Name: Alexandra Pierce MRN: [id number] Date of Birth: 1946-07-22   Medicare Observation Status Notification Given:  Yes    Erenest Rasher, RN 03/18/2016, 1:01 PM

## 2016-03-18 NOTE — Discharge Instructions (Signed)
Coronary Angiogram A coronary angiogram, also called coronary angiography, is an X-ray procedure used to look at the arteries in the heart. In this procedure, a dye (contrast dye) is injected through a long, hollow tube (catheter). The catheter is about the size of a piece of cooked spaghetti and is inserted through your groin, wrist, or arm. The dye is injected into each artery, and X-rays are then taken to show if there is a blockage in the arteries of your heart. LET [facility] CARE PROVIDER KNOW ABOUT:  Any allergies you have, including allergies to shellfish or contrast dye.   All medicines you are taking, including vitamins, herbs, eye drops, creams, and over-the-counter medicines.   Previous problems you or members of your family have had with the use of anesthetics.   Any blood disorders you have.   Previous surgeries you have had.  History of kidney problems or failure.   Other medical conditions you have. RISKS AND COMPLICATIONS  Generally, a coronary angiogram is a safe procedure. However, problems can occur and include:  Allergic reaction to the dye.  Bleeding from the access site or other locations.  Kidney injury, especially in people with impaired kidney function.  Stroke (rare).  Heart attack (rare). BEFORE THE PROCEDURE   Do not eat or drink anything after midnight the night before the procedure or as directed by your health care provider.   Ask your health care provider about changing or stopping your regular medicines. This is especially important if you are taking diabetes medicines or blood thinners. PROCEDURE  You may be given a medicine to help you relax (sedative) before the procedure. This medicine is given through an intravenous (IV) access tube that is inserted into one of your veins.   The area where the catheter will be inserted will be washed and shaved. This is usually done in the groin but may be done in the fold of your arm (near your  elbow) or in the wrist.   A medicine will be given to numb the area where the catheter will be inserted (local anesthetic).   The health care provider will insert the catheter into an artery. The catheter will be guided by using a special type of X-ray (fluoroscopy) of the blood vessel being examined.   A special dye will then be injected into the catheter, and X-rays will be taken. The dye will help to show where any narrowing or blockages are located in the heart arteries.  AFTER THE PROCEDURE   If the procedure is done through the leg, you will be kept in bed lying flat for several hours. You will be instructed to not bend or cross your legs.  The insertion site will be checked frequently.   The pulse in your feet or wrist will be checked frequently.   Additional blood tests, X-rays, and an electrocardiogram may be done.    This information is not intended to replace advice given to you by your health care provider. Make sure you discuss any questions you have with your health care provider.   Document Released: 04/10/2003 Document Revised: 10/25/2014 Document Reviewed: 02/26/2013 Elsevier Interactive Patient Education 2016 [employer]vier Inc[employer]    Atrial Fibrillation Atrial fibrillation is a type of irregular or rapid heartbeat (arrhythmia). In atrial fibrillation, the heart quivers continuously in a chaotic pattern. This occurs when parts of the heart receive disorganized signals that make the heart unable to pump blood normally. This can increase the risk for stroke, heart failure, and  other heart-related conditions. There are different types of atrial fibrillation, including:  Paroxysmal atrial fibrillation. This type starts suddenly, and it usually stops on its own shortly after it starts.  Persistent atrial fibrillation. This type often lasts longer than a week. It may stop on its own or with treatment.  Long-lasting persistent atrial fibrillation. This type lasts longer than  12 months.  Permanent atrial fibrillation. This type does not go away. Talk with your health care provider to learn about the type of atrial fibrillation that you have. CAUSES This condition is caused by some heart-related conditions or procedures, including:  A heart attack.  Coronary artery disease.  Heart failure.  Heart valve conditions.  High blood pressure.  Inflammation of the sac that surrounds the heart (pericarditis).  Heart surgery.  Certain heart rhythm disorders, such as Wolf-Parkinson-White syndrome. Other causes include:  Pneumonia.  Obstructive sleep apnea.  Blockage of an artery in the lungs (pulmonary embolism, or PE).  Lung cancer.  Chronic lung disease.  Thyroid problems, especially if the thyroid is overactive (hyperthyroidism).  Caffeine.  Excessive alcohol use or illegal drug use.  Use of some medicines, including certain decongestants and diet pills. Sometimes, the cause cannot be found. RISK FACTORS This condition is more likely to develop in:  People who are older in age.  People who smoke.  People who have diabetes mellitus.  People who are overweight (obese).  Athletes who exercise vigorously. SYMPTOMS Symptoms of this condition include:  A feeling that your heart is beating rapidly or irregularly.  A feeling of discomfort or pain in your chest.  Shortness of breath.  Sudden light-headedness or weakness.  Getting tired easily during exercise. In some cases, there are no symptoms. DIAGNOSIS Your health care provider may be able to detect atrial fibrillation when taking your pulse. If detected, this condition may be diagnosed with:  An electrocardiogram (ECG).  A Holter monitor test that records your heartbeat patterns over a 24-hour period.  Transthoracic echocardiogram (TTE) to evaluate how blood flows through your heart.  Transesophageal echocardiogram (TEE) to view more detailed images of your heart.  A stress  test.  Imaging tests, such as a CT scan or chest X-ray.  Blood tests. TREATMENT The main goals of treatment are to prevent blood clots from forming and to keep your heart beating at a normal rate and rhythm. The type of treatment that you receive depends on many factors, such as your underlying medical conditions and how you feel when you are experiencing atrial fibrillation. This condition may be treated with:  Medicine to slow down the heart rate, bring the heart's rhythm back to normal, or prevent clots from forming.  Electrical cardioversion. This is a procedure that resets your heart's rhythm by delivering a controlled, low-energy shock to the heart through your skin.  Different types of ablation, such as catheter ablation, catheter ablation with pacemaker, or surgical ablation. These procedures destroy the heart tissues that send abnormal signals. When the pacemaker is used, it is placed under your skin to help your heart beat in a regular rhythm. HOME CARE INSTRUCTIONS  Take over-the counter and prescription medicines only as told by your health care provider.  If your health care provider prescribed a blood-thinning medicine (anticoagulant), take it exactly as told. Taking too much blood-thinning medicine can cause bleeding. If you do not take enough blood-thinning medicine, you will not have the protection that you need against stroke and other problems.  Do not use tobacco products, including  cigarettes, chewing tobacco, and e-cigarettes. If you need help quitting, ask your health care provider.  If you have obstructive sleep apnea, manage your condition as told by your health care provider.  Do not drink alcohol.  Do not drink beverages that contain caffeine, such as coffee, soda, and tea.  Maintain a healthy weight. Do not use diet pills unless your health care provider approves. Diet pills may make heart problems worse.  Follow diet instructions as told by your health care  provider.  Exercise regularly as told by your health care provider.  Keep all follow-up visits as told by your health care provider. This is important. PREVENTION  Avoid drinking beverages that contain caffeine or alcohol.  Avoid certain medicines, especially medicines that are used for breathing problems.  Avoid certain herbs and herbal medicines, such as those that contain ephedra or ginseng.  Do not use illegal drugs, such as cocaine and amphetamines.  Do not smoke.  Manage your high blood pressure. SEEK MEDICAL CARE IF:  You notice a change in the rate, rhythm, or strength of your heartbeat.  You are taking an anticoagulant and you notice increased bruising.  You tire more easily when you exercise or exert yourself. SEEK IMMEDIATE MEDICAL CARE IF:  You have chest pain, abdominal pain, sweating, or weakness.  You feel nauseous.  You notice blood in your vomit, bowel movement, or urine.  You have shortness of breath.  You suddenly have swollen feet and ankles.  You feel dizzy.  You have sudden weakness or numbness of the face, arm, or leg, especially on one side of the body.  You have trouble speaking, trouble understanding, or both (aphasia).  Your face or your eyelid droops on one side. These symptoms may represent a serious problem that is an emergency. Do not wait to see if the symptoms will go away. Get medical help right away. Call your local emergency services (911 in the [place] Do not drive yourself to the hospital.   This information is not intended to replace advice given to you by your health care provider. Make sure you discuss any questions you have with your health care provider.   Document Released: 10/04/2005 Document Revised: 06/25/2015 Document Reviewed: 01/29/2015 Elsevier Interactive Patient Education 2016 [place]. Coronary Angiogram A coronary angiogram, also called coronary angiography, is an X-ray procedure used to look at the arteries  in the heart. In this procedure, a dye (contrast dye) is injected through a long, hollow tube (catheter). The catheter is about the size of a piece of cooked spaghetti and is inserted through your groin, wrist, or arm. The dye is injected into each artery, and X-rays are then taken to show if there is a blockage in the arteries of your heart. LET [facility] CARE PROVIDER KNOW ABOUT:  Any allergies you have, including allergies to shellfish or contrast dye.   All medicines you are taking, including vitamins, herbs, eye drops, creams, and over-the-counter medicines.   Previous problems you or members of your family have had with the use of anesthetics.   Any blood disorders you have.   Previous surgeries you have had.  History of kidney problems or failure.   Other medical conditions you have. RISKS AND COMPLICATIONS  Generally, a coronary angiogram is a safe procedure. However, problems can occur and include:  Allergic reaction to the dye.  Bleeding from the access site or other locations.  Kidney injury, especially in people with impaired kidney function.  Stroke (rare).  Heart attack (rare). BEFORE THE PROCEDURE   Do not eat or drink anything after midnight the night before the procedure or as directed by your health care provider.   Ask your health care provider about changing or stopping your regular medicines. This is especially important if you are taking diabetes medicines or blood thinners. PROCEDURE  You may be given a medicine to help you relax (sedative) before the procedure. This medicine is given through an intravenous (IV) access tube that is inserted into one of your veins.   The area where the catheter will be inserted will be washed and shaved. This is usually done in the groin but may be done in the fold of your arm (near your elbow) or in the wrist.   A medicine will be given to numb the area where the catheter will be inserted (local anesthetic).    The health care provider will insert the catheter into an artery. The catheter will be guided by using a special type of X-ray (fluoroscopy) of the blood vessel being examined.   A special dye will then be injected into the catheter, and X-rays will be taken. The dye will help to show where any narrowing or blockages are located in the heart arteries.  AFTER THE PROCEDURE   If the procedure is done through the leg, you will be kept in bed lying flat for several hours. You will be instructed to not bend or cross your legs.  The insertion site will be checked frequently.   The pulse in your feet or wrist will be checked frequently.   Additional blood tests, X-rays, and an electrocardiogram may be done.    This information is not intended to replace advice given to you by your health care provider. Make sure you discuss any questions you have with your health care provider.   Document Released: 04/10/2003 Document Revised: 10/25/2014 Document Reviewed: 02/26/2013 Elsevier Interactive Patient Education [employer].

## 2016-03-18 NOTE — Progress Notes (Signed)
Patient discharged home with friend. IV's were dc'd and were intact. Medications were educated on and discharge instructions were given. Patient stated that she understood.

## 2016-03-18 NOTE — Discharge Summary (Signed)
Physician Discharge Summary  Alexandra Pierce [id number] DOB: 1946/03/27 DOA: 03/15/2016  PCP: Antony Blackbird, MD  Admit date: 03/15/2016 Discharge date: 03/18/2016  Recommendations for Outpatient Follow-up:  1. Pt will need to follow up with PCP in 2-3 weeks post discharge 2. Please obtain BMP to evaluate electrolytes and kidney function 3. Please also check CBC to evaluate Hg and Hct levels  Discharge Diagnoses:  Principal Problem:   Chest pain Active Problems:   Essential hypertension   Atrial fibrillation (HCC)   ARF (acute renal failure) (HCC)   Coronary artery disease involving native coronary artery of native heart with unstable angina pectoris ([place])   Unstable angina ([place])    Discharge Condition: Stable  Diet recommendation: Heart healthy diet discussed in details    Brief Narrative:  70 y.o. female with known chronic atrial fibrillation, previous stroke, hypertension and hyperlipidemia presents to the ER because of chest pain. Patient started developing chest pain last night around 9:45 PM while at home. Chest pain was across the chest with palpitations. Had diaphoresis and nausea. Denies any vomiting. Chest pain got better after patient came to the ER without any intervention. EKG shows A. fib controlled rate. Patient states she did not miss her medications. Cardiac markers are negative chest x-ray does not show anything acute and patient is chest pain-free at this time. Patient is admitted for further workup for chest pain. Patient states the chest pain was like a pressure-like across the chest.   Assessment & Plan:  Principal Problem: Chest pain secondary to Unstable angina - Cardiac cath 03/17/16 with mild to moderate non-obstructive CAD.  - per cardiology, medical management of CAD is recommended.  - LVEF noted to be reduced on LV gram but echo the same day shows normal LV function, cardiology team has reviewed, pt will follow up with Dr. Rayann Heman, pt made aware  PAF,  CHADS2vasc score 5 - currently in atrial fibrillation, rate 60 - 80's - continue Xarelto and Metoprolol on discharge   HLD - intolerant to statins. Continue Fenofibrate.  HTN, essential - plan to resume ACEI upon discharge   ARF (acute renal failure) ([place]) - pre renal in etiology - resolved    DVT prophylaxis: on Xarelto  Code Status: Full Family Communication: Patient at bedside, discussed findings on ECHO and cardiac cath, spent over 30 minutes, al questions answered to pt's satisfaction  Disposition Plan: Home   Consultants:   Cardiology  Procedures:   Cardiac cath 5/31 -->  Antimicrobials:   None  Discharge Exam: Filed Vitals:   03/18/16 0428 03/18/16 1018  BP: 120/67 108/69  Pulse: 65 70  Temp: 98.1 F (36.7 C)   Resp: 16    Filed Vitals:   03/17/16 1245 03/17/16 2010 03/18/16 0428 03/18/16 1018  BP: 109/60 96/53 120/67 108/69  Pulse: 62 66 65 70  Temp:  97.7 F (36.5 C) 98.1 F (36.7 C)   TempSrc:  Oral Oral   Resp: 16 18 16    Height:      Weight:      SpO2: 97% 98% 98%     General: Pt is alert, follows commands appropriately, not in acute distress Cardiovascular: Irregular rate and rhythm, no rubs, no gallops Respiratory: Clear to auscultation bilaterally, no wheezing, no crackles, no rhonchi Abdominal: Soft, non tender, non distended, bowel sounds +, no guarding Extremities: no edema, no cyanosis, pulses palpable bilaterally DP and PT Neuro: Grossly nonfocal  Discharge Instructions  Discharge Instructions    Diet - low sodium  heart healthy    Complete by:  As directed      Increase activity slowly    Complete by:  As directed             Medication List    STOP taking these medications        flecainide 100 MG tablet  Commonly known as:  TAMBOCOR      TAKE these medications        acetaminophen 500 MG tablet  Commonly known as:  TYLENOL  Take 1,000 mg by mouth as needed for mild pain or headache.     cholecalciferol  1000 units tablet  Commonly known as:  VITAMIN D  Take 1,000 Units by mouth daily.     fenofibrate 160 MG tablet  TAKE ONE TABLET BY MOUTH ONCE DAILY     metoprolol tartrate 25 MG tablet  Commonly known as:  LOPRESSOR  Take 1 tablet (25 mg total) by mouth 2 (two) times daily.     ramipril 2.5 MG capsule  Commonly known as:  ALTACE  TAKE 1 CAPSULE BY MOUTH 3 TIMES TIMES DAILY. TAKE 2 CAPSULES IN THE MORNING, AND 1 CAPSULE IN THE EVENING     XARELTO 20 MG Tabs tablet  Generic drug:  rivaroxaban  TAKE ONE TABLET BY MOUTH ONE TIME DAILY           Follow-up Information    Follow up with FULP, CAMMIE, MD.   Specialty:  Family Medicine   Contact information:   [address] [place] [address] [place] [address] [phone]       Schedule an appointment as soon as possible for a visit with Thompson Grayer, MD.   Specialty:  Cardiology   Contact information:   [address] [place] [address] [phone]       Call Faye Ramsay, MD.   Specialty:  Internal Medicine   Why:  As needed call my cell phone [phone]    Contact information:   [address] [place] [address] [phone]        The results of significant diagnostics from this hospitalization (including imaging, microbiology, ancillary and laboratory) are listed below for reference.     Microbiology: Recent Results (from the past 240 hour(s))  Surgical pcr screen     Status: None   Collection Time: 03/16/16  8:40 PM  Result Value Ref Range Status   MRSA, PCR NEGATIVE NEGATIVE Final   Staphylococcus aureus NEGATIVE NEGATIVE Final    Comment:        The Xpert SA Assay (FDA approved for NASAL specimens in patients over 27 years of age), is one component of a comprehensive surveillance program.  Test performance has been validated by [facility] for patients greater than or equal to 27 year old. It is not intended to diagnose infection nor to guide or monitor  treatment.      Labs: Basic Metabolic Panel:  Recent Labs Lab 03/15/16 2245 03/17/16 0220 03/18/16 0320  NA 139 141 141  K 3.2* 3.8 4.2  CL 104 110 111  CO2 25 25 24   GLUCOSE 140* 107* 93  BUN 23* 12 12  CREATININE 1.35* 0.90 0.78  CALCIUM 9.5 8.2* 8.7*   CBC:  Recent Labs Lab 03/15/16 2245 03/18/16 0320  WBC 5.2 4.6  HGB 13.9 12.0  HCT 40.6 35.1*  MCV 97.8 98.6  PLT 400 350   Cardiac Enzymes:  Recent Labs Lab 03/16/16 0240 03/16/16 0810  03/16/16 1351  TROPONINI 0.04* 0.04* 0.04*   SIGNED: Time coordinating discharge: 30 minutes  Faye Ramsay, MD  [facility] 03/18/2016, 12:19 PM Pager [phone]  If 7PM-7AM, please contact night-coverage [email or web address] Password [email or web address]

## 2016-03-18 NOTE — Progress Notes (Addendum)
SUBJECTIVE: Feels great. No chest pain or SOB.   Tele: atrial fib, rate 60s  BP 120/67 mmHg  Pulse 65  Temp(Src) 98.1 F (36.7 C) (Oral)  Resp 16  Ht 5\' 6"  (1.676 m)  Wt 178 lb 3.2 oz (80.831 kg)  BMI 28.78 kg/m2  SpO2 98%  Intake/Output Summary (Last 24 hours) at 03/18/16 0748 Last data filed at 03/17/16 1256  Gross per 24 hour  Intake      0 ml  Output    200 ml  Net   -200 ml    PHYSICAL EXAM General: Well developed, well nourished, in no acute distress. Alert and oriented x 3.  Psych:  Good affect, responds appropriately Neck: No JVD. No masses noted.  Lungs: Clear bilaterally with no wheezes or rhonci noted.  Heart: irreg irreg with no murmurs noted. Abdomen: Bowel sounds are present. Soft, non-tender.  Extremities: No lower extremity edema.   LABS: Basic Metabolic Panel:  Recent Labs  03/17/16 0220 03/18/16 0320  NA 141 141  K 3.8 4.2  CL 110 111  CO2 25 24  GLUCOSE 107* 93  BUN 12 12  CREATININE 0.90 0.78  CALCIUM 8.2* 8.7*   CBC:  Recent Labs  03/15/16 2245 03/18/16 0320  WBC 5.2 4.6  HGB 13.9 12.0  HCT 40.6 35.1*  MCV 97.8 98.6  PLT 400 350   Cardiac Enzymes:  Recent Labs  03/16/16 0240 03/16/16 0810 03/16/16 1351  TROPONINI 0.04* 0.04* 0.04*   Fasting Lipid Panel:  Recent Labs  03/17/16 1821  CHOL 208*  HDL 39*  LDLCALC 150*  TRIG 94  CHOLHDL 5.3    Current Meds: . cholecalciferol  1,000 Units Oral Daily  . fenofibrate  160 mg Oral Daily  . metoprolol tartrate  25 mg Oral BID  . ramipril  2.5 mg Oral BID  . rivaroxaban  20 mg Oral Daily  . sodium chloride flush  3 mL Intravenous Q12H   Echo 03/17/16: Left ventricle: The cavity size was normal. Systolic function was  normal. The estimated ejection fraction was 55%. Wall motion was  normal; there were no regional wall motion abnormalities. Normal  sinus rhythm was absent. The study is not technically sufficient  to allow evaluation of LV diastolic  function. - Aortic valve: Moderately calcified annulus. Trileaflet; mildly  thickened, mildly calcified leaflets. - Mitral valve: There was mild regurgitation. - Left atrium: The atrium was moderately dilated. - Pulmonary arteries: PA peak pressure: 32 mm Hg (S).   ASSESSMENT AND PLAN:  1. CAD with angina/Chest pain: Pt admitted with chest pain worrisome for unstable angina. Cardiac cath 03/17/16 with mild to moderate non-obstructive CAD. Medical management of CAD is recommended.  LVEF noted to be reduced on LV gram but echo the same day shows normal LV function. I have reviewed the echo images and the inferior wall moves well. I would estimate her overall LVEF by echo to be 50%. The abnormality on LV function noted on LV gram may have been due to PVCs just before the imaging.  2. PAF: currently in atrial fibrillation, rate-controlled in the 60's. This patients CHA2DS2-VASc Score and unadjusted Ischemic Stroke Rate (% per year) is equal to 7.2 % stroke rate/year from a score of 5 (Age, Female, HTN, CVA(2)). Xarelto will be restarted today. Flecainide has been stopped and beta blocker has been added.  She should be discharged on Metoprolol 25 mg BID  3. HLD: intolerant to statins. Continue Fenofibrate.  4.  HTN: BP controlled  OK to discharge from cardiac perspective. Follow up Dr. Rayann Heman as planned.    Alexandra Pierce  6/1/20177:48 AM

## 2016-03-26 ENCOUNTER — Encounter: Payer: Self-pay | Admitting: Nurse Practitioner

## 2016-03-26 ENCOUNTER — Ambulatory Visit (INDEPENDENT_AMBULATORY_CARE_PROVIDER_SITE_OTHER): Payer: Medicare Other | Admitting: Nurse Practitioner

## 2016-03-26 VITALS — BP 110/60 | HR 56 | Ht 66.0 in | Wt 178.4 lb

## 2016-03-26 DIAGNOSIS — I259 Chronic ischemic heart disease, unspecified: Secondary | ICD-10-CM | POA: Diagnosis not present

## 2016-03-26 DIAGNOSIS — I482 Chronic atrial fibrillation, unspecified: Secondary | ICD-10-CM

## 2016-03-26 DIAGNOSIS — Z7901 Long term (current) use of anticoagulants: Secondary | ICD-10-CM

## 2016-03-26 NOTE — Patient Instructions (Addendum)
We will be checking the following labs today - NONE   Medication Instructions:    Continue with your current medicines.     Testing/Procedures To Be Arranged:  N/A  Follow-Up:   See me in 3 months    Other Special Instructions:   N/A    If you need a refill on your cardiac medications before your next appointment, please call your pharmacy.   Call the [facility] office at [phone] if you have any questions, problems or concerns.      

## 2016-03-26 NOTE — Progress Notes (Signed)
CARDIOLOGY OFFICE NOTE  Date:  03/26/2016    Alexandra Pierce Date of Birth: 11-Jul-1946 Medical Record [id number]  PCP:  Antony Blackbird, MD  Cardiologist:  Allred    Chief Complaint  Patient presents with  . Atrial Fibrillation  . Hypertension  . Hyperlipidemia    Follow up visit - seen for Dr. Rayann Heman    History of Present Illness: Alexandra Pierce is a 70 y.o. female who presents today for a follow up/post hospital visit.  She is seen for Dr. Rayann Heman - former patient of Dr.Tennant's.   She has PAF, remote stroke, and on chronic anticoagulation - now on Xarelto due to labile readings with her coumadin. Other issues include HLD, HTN and obesity. She has had statin intolerance. Prior cath in 2001 with mild CAD and EF of 45 to 50%.  Hospitalized back in March of 2015 with recurrent stroke - multiple infarcts on MRI. Had missed several doses of her Xarelto. Was in atrial fib on arrival to the ER at that time. Understood the need to NOT miss her Xarelto. She had been traveling and really got out of her routine when that happened.   I had Dr. Rayann Heman review her medicines - he felt her current regimen was ok and that if she had worsening symptoms of afib then we could consider another antiarrhythmic drug.   I last saw her in December and she was doing ok with very little AF.    Just discharged from [facility] on June 1 - had chest pain - was in atrial fib - she was cathed - EF has dropped but was normal by echo. Mild to moderate non obstructive CAD noted on cath with medical management recommended. Flecainide was stopped, metoprolol added and she is now managed with rate control and continued anticoagulation.   Comes back today - here alone. She says she is now doing just fine. No more chest pain and not short of breath. Her mother had died the week prior at age 46 and she had been going thru all of those arrangements the week prior. She wonders if this was all stress related. She does not really feel  like she has had much in the way of AF. She likes being on the metoprolol now. Remains on Xarelto. Some cramps since starting the metoprolol.   Past Medical History  Diagnosis Date  . Hypercholesteremia   . Hypertension     mild  . Obesity   . Paroxysmal atrial fibrillation (HCC)     managed with anticoagulation and rate control.   . Atrial fibrillation ([place])   . Chronic anticoagulation   . Stroke [facility]) 2001 & 2015    "right side gets weak sometimes if I'm only tired" (03/16/2016)  . Chronic lower back pain     Past Surgical History  Procedure Laterality Date  . Cardiac catheterization  03/31/2000    EF 49%/LAD lg vessel which coursed to the apex & gave rise to 2 diagonal branches/ LAD noted to have 30% ostial lesion & tapers to a sm vessel toward the apex but no high grade lesion/1st diagonal med sized vessel & 2nd diagonal sm vessel with no significant disease/ lt ventriculogram reveals mild global hypokinesis w/ an estimated EF of 45-50%  . Cardiac catheterization N/A 03/17/2016    Procedure: Left Heart Cath and Coronary Angiography;  Surgeon: Wellington Hampshire, MD;  Location: [place] [facility];  Service: Cardiovascular;  Laterality: N/A;     Medications:  Current Outpatient Prescriptions  Medication Sig Dispense Refill  . acetaminophen (TYLENOL) 500 MG tablet Take 1,000 mg by mouth as needed for mild pain or headache.     . cholecalciferol (VITAMIN D) 1000 UNITS tablet Take 1,000 Units by mouth daily.    . fenofibrate 160 MG tablet TAKE ONE TABLET BY MOUTH ONCE DAILY 90 tablet 3  . metoprolol tartrate (LOPRESSOR) 25 MG tablet Take 1 tablet (25 mg total) by mouth 2 (two) times daily. 60 tablet 1  . ramipril (ALTACE) 2.5 MG capsule Take 2.5 mg by mouth 2 (two) times daily. Two tablets ( 5 mg ) in the am one tablet (2.5 mg) in the pm    . XARELTO 20 MG TABS tablet TAKE ONE TABLET BY MOUTH ONE TIME DAILY 30 tablet 7   No current facility-administered medications for this visit.     Allergies: Allergies  Allergen Reactions  . Sulfonamide Derivatives Anaphylaxis  . Lipitor [Atorvastatin] Other (See Comments)    Caused bladder infection  . Pravachol [Pravastatin Sodium] Other (See Comments)    Myalgias  . Statins Other (See Comments)    Myalgia    Social History: The patient  reports that she quit smoking about 37 years ago. Her smoking use included Cigarettes. She has a 1.8 pack-year smoking history. She has never used smokeless tobacco. She reports that she drinks about 8.4 oz of alcohol per week. She reports that she does not use illicit drugs.   Family History: The patient's family history includes Asthma in her father; Diabetes in her father; Heart disease in her brother, father, and mother; Hyperlipidemia in her brother, brother, and sister.   Review of Systems: Please see the history of present illness.   Otherwise, the review of systems is positive for none.   All other systems are reviewed and negative.   Physical Exam: VS:  BP 110/60 mmHg  Pulse 56  Ht 5\' 6"  (1.676 m)  Wt 178 lb 6.4 oz (80.922 kg)  BMI 28.81 kg/m2 .  BMI Body mass index is 28.81 kg/(m^2).  Wt Readings from Last 3 Encounters:  03/26/16 178 lb 6.4 oz (80.922 kg)  03/17/16 178 lb 3.2 oz (80.831 kg)  09/26/15 183 lb 12.8 oz (83.371 kg)    General: Pleasant. Well developed, well nourished and in no acute distress.  HEENT: Normal. Neck: Supple, no JVD, carotid bruits, or masses noted.  Cardiac: She is in a regular rhythm today. Rate is fine.  No murmurs, rubs, or gallops. No edema.  Respiratory:  Lungs are clear to auscultation bilaterally with normal work of breathing.  GI: Soft and nontender.  MS: No deformity or atrophy. Gait and ROM intact. Skin: Warm and dry. Color is normal.  Neuro:  Strength and sensation are intact and no gross focal deficits noted.  Psych: Alert, appropriate and with normal affect.   LABORATORY DATA:  EKG:  EKG is not ordered today.  Lab Results   Component Value Date   WBC 4.6 03/18/2016   HGB 12.0 03/18/2016   HCT 35.1* 03/18/2016   PLT 350 03/18/2016   GLUCOSE 93 03/18/2016   CHOL 208* 03/17/2016   TRIG 94 03/17/2016   HDL 39* 03/17/2016   LDLDIRECT 163.2 01/19/2013   LDLCALC 150* 03/17/2016   ALT 13 09/26/2015   AST 16 09/26/2015   NA 141 03/18/2016   K 4.2 03/18/2016   CL 111 03/18/2016   CREATININE 0.78 03/18/2016   BUN 12 03/18/2016   CO2 24 03/18/2016  INR 1.40 03/17/2016   HGBA1C 5.6 01/11/2014    BNP (last 3 results) No results for input(s): BNP in the last 8760 hours.  ProBNP (last 3 results) No results for input(s): PROBNP in the last 8760 hours.   Other Studies Reviewed Today:   Cardiac Cath Conclusion from 02/2016     Mid RCA lesion, 20% stenosed.  Mid Cx lesion, 20% stenosed.  Dist LAD lesion, 50% stenosed.  Mid LAD lesion, 20% stenosed.  Ost LAD lesion, 30% stenosed.  There is moderate to severe left ventricular systolic dysfunction.  1. Mild to moderate nonobstructive coronary artery disease. 2. Moderately to severely reduced LV systolic function with an ejection fraction of 30-35% with moderate mitral regurgitation. 3. Normal LVEDP.   Recommendations: I don't see a culprit for unstable angina. The patient clearly has worsening LV systolic dysfunction. I wonder if this is related to tachycardia-induced cardiomyopathy given that the patient has been in atrial fibrillation over the last 6-12 months. I discontinued flecainide and added metoprolol which can be switched to long-acting before discharge. I resumed ramipril. Xarelto can be resumed tomorrow.    Echo Study Conclusions from 02/2016  - Left ventricle: The cavity size was normal. Systolic function was  normal. The estimated ejection fraction was 55%. Wall motion was  normal; there were no regional wall motion abnormalities. Normal  sinus rhythm was absent. The study is not technically sufficient  to allow evaluation of  LV diastolic function. - Aortic valve: Moderately calcified annulus. Trileaflet; mildly  thickened, mildly calcified leaflets. - Mitral valve: There was mild regurgitation. - Left atrium: The atrium was moderately dilated. - Pulmonary arteries: PA peak pressure: 32 mm Hg (S).   Carotid Doppler Summary 2015: Bilateral: intimal wall thickening CCA. Mild soft plaque origin ICA. 1-39% ICA stenosis. Vertebral artery flow is antegrade.  Other specific details can be found in the table(s) above. Prepared and Electronically Authenticated by  Antony Contras 2015-03-30T10:55:00.567   Assessment / Plan:  1. CAD with angina/Chest pain: Pt recently admitted with chest pain worrisome for unstable angina. Cardiac cath 03/17/16 with mild to moderate non-obstructive CAD. Medical management of CAD is recommended. LVEF noted to be reduced on LV gram but echo the same day shows normal LV function. Dr. Angelena Form reviewed the echo images and the inferior wall moves well. He estimates her overall LVEF by echo to be 50%. The abnormality on LV function noted on LV gram may have been due to PVCs just before the imaging. Clinically she is doing well. She does not wish to pursue other testing at this time - cardiac MRI. I wonder if her presentation may be more Takotsubo like.  Would like to see her back in 3 months.   2. PAF: pretty regular today and she did not wish to have EKG. She is now managed with rate control and continues on her Xarelto.  This patients CHA2DS2-VASc Score and unadjusted Ischemic Stroke Rate (% per year) is equal to 7.2 % stroke rate/year from a score of 5 (Age, Female, HTN, CVA(2)).   3. HLD: intolerant to statins. Continue Fenofibrate.  4. HTN: BP controlled on her current regimen.   5. Chronic anticoagulation - no problems noted.   6. Prior stroke - minimal residual deficit.  Current medicines are reviewed with the patient today.  The patient does not have concerns regarding medicines  other than what has been noted above.  The following changes have been made:  See above.  Labs/ tests ordered today include:  No orders of the defined types were placed in this encounter.     Disposition:   FU with me in 3 months.   Patient is agreeable to this plan and will call if any problems develop in the interim.   Signed: Burtis Junes, RN, ANP-C 03/26/2016 10:57 AM  [facility] [address] [place], [place]  [address] Phone: [phone] Fax: [phone]

## 2016-06-26 ENCOUNTER — Other Ambulatory Visit: Payer: Self-pay | Admitting: Nurse Practitioner

## 2016-07-02 ENCOUNTER — Ambulatory Visit (INDEPENDENT_AMBULATORY_CARE_PROVIDER_SITE_OTHER): Payer: Medicare Other | Admitting: Nurse Practitioner

## 2016-07-02 ENCOUNTER — Encounter: Payer: Self-pay | Admitting: Nurse Practitioner

## 2016-07-02 VITALS — BP 116/78 | HR 80 | Ht 66.0 in | Wt 179.0 lb

## 2016-07-02 DIAGNOSIS — Z7901 Long term (current) use of anticoagulants: Secondary | ICD-10-CM

## 2016-07-02 DIAGNOSIS — E785 Hyperlipidemia, unspecified: Secondary | ICD-10-CM

## 2016-07-02 DIAGNOSIS — I259 Chronic ischemic heart disease, unspecified: Secondary | ICD-10-CM

## 2016-07-02 DIAGNOSIS — I482 Chronic atrial fibrillation, unspecified: Secondary | ICD-10-CM

## 2016-07-02 MED ORDER — METOPROLOL TARTRATE 25 MG PO TABS: 25.0000 mg | ORAL_TABLET | Freq: Two times a day (BID) | ORAL | 3 refills | Status: DC

## 2016-07-02 MED ORDER — METOPROLOL TARTRATE 12.5 MG HALF TABLET: 25.0000 mg | ORAL_TABLET | Freq: Once | ORAL | Status: DC

## 2016-07-02 NOTE — Addendum Note (Signed)
Addended by: Dennie Fetters on: 07/02/2016 11:13 AM   Modules accepted: Orders

## 2016-07-02 NOTE — Patient Instructions (Addendum)
We will be checking the following labs today - NONE   Medication Instructions:    Continue with your current medicines.   I have refilled your metoprolol today.   We gave you a dose of your metoprolol here today  Discard your Flecainide    Testing/Procedures To Be Arranged:  N/A  Follow-Up:   See me in 6 months (try for an 8AM time) - will plan to do fasting labs on return    Other Special Instructions:   N/A    If you need a refill on your cardiac medications before your next appointment, please call your pharmacy.   Call the [place] office at [phone] if you have any questions, problems or concerns.

## 2016-07-02 NOTE — Addendum Note (Signed)
Addended by: Truitt Merle C on: 07/02/2016 10:00 AM   Modules accepted: Orders

## 2016-07-02 NOTE — Progress Notes (Signed)
CARDIOLOGY OFFICE NOTE  Date:  07/02/2016    Alexandra Pierce Date of Birth: 03/25/1946 Medical Record [id number]  PCP:  Antony Blackbird, MD  Cardiologist:  Servando Snare & Allred    Chief Complaint  Patient presents with  . Atrial Fibrillation    3 month check - seen for Dr. Rayann Heman    History of Present Illness: Alexandra Pierce is a 70 y.o. female who presents today for a 3 month check. She is seen for Dr. Rayann Heman - former patient of Dr.Tennant's.   She has PAF, remote stroke, and on chronic anticoagulation - now on Xarelto due to labile readings with her coumadin. Other issues include HLD, HTN and obesity. She has had statin intolerance. Prior cath in 2001 with mild CAD and EF of 45 to 50%.  Hospitalized back in March of 2015 with recurrent stroke - multiple infarcts on MRI. Had missed several doses of her Xarelto. Was in atrial fib on arrival to the ER at that time. Understood the need to NOT miss her Xarelto. She had been traveling and really got out of her routine when that happened.   I had Dr. Rayann Heman review her medicines - he felt her current regimen was ok and that if she had worsening symptoms of afib then we could consider another antiarrhythmic drug.   I saw her in December of 2016 and she was doing ok with very little AF.    Discharged from [facility] on June 1 of 2017 - had chest pain - was in atrial fib - she was cathed - EF had dropped but was normal by echo. Mild to moderate non obstructive CAD noted on cath with medical management recommended. Flecainide was stopped, metoprolol added and she is now managed with rate control and continued anticoagulation.   I saw her for her post hospital visit - she was doing well.   Comes back today - here alone. She is doing well. She notes she has ran out of her Metoprolol - could not get refilled - looks like the pharmacy was trying to reach the hospitalist and not [place]. She does feel like this causes some cramps at night. Some palpitations  now - especially had last night. She actually took a dose (perhaps 2) of her Flecainide to "calm it down". Remains on her Xarelto and no missed doses. She is happy. Her significant other has moved in. They are adjusting but happy.   Past Medical History:  Diagnosis Date  . Atrial fibrillation ([place])   . Chronic anticoagulation   . Chronic lower back pain   . Hypercholesteremia   . Hypertension    mild  . Obesity   . Paroxysmal atrial fibrillation (HCC)    managed with anticoagulation and rate control.   . Stroke [facility]) 2001 & 2015   "right side gets weak sometimes if I'm only tired" (03/16/2016)    Past Surgical History:  Procedure Laterality Date  . CARDIAC CATHETERIZATION  03/31/2000   EF 49%/LAD lg vessel which coursed to the apex & gave rise to 2 diagonal branches/ LAD noted to have 30% ostial lesion & tapers to a sm vessel toward the apex but no high grade lesion/1st diagonal med sized vessel & 2nd diagonal sm vessel with no significant disease/ lt ventriculogram reveals mild global hypokinesis w/ an estimated EF of 45-50%  . CARDIAC CATHETERIZATION N/A 03/17/2016   Procedure: Left Heart Cath and Coronary Angiography;  Surgeon: Wellington Hampshire, MD;  Location: [place]  CV LAB;  Service: Cardiovascular;  Laterality: N/A;     Medications: Current Outpatient Prescriptions  Medication Sig Dispense Refill  . acetaminophen (TYLENOL) 500 MG tablet Take 1,000 mg by mouth as needed for mild pain or headache.     . cholecalciferol (VITAMIN D) 1000 UNITS tablet Take 1,000 Units by mouth daily.    . fenofibrate 160 MG tablet TAKE ONE TABLET BY MOUTH ONCE DAILY 90 tablet 3  . metoprolol tartrate (LOPRESSOR) 25 MG tablet Take 1 tablet (25 mg total) by mouth 2 (two) times daily. 60 tablet 1  . ramipril (ALTACE) 2.5 MG capsule Take 2.5 mg by mouth 2 (two) times daily. Two tablets ( 5 mg ) in the am one tablet (2.5 mg) in the pm    . XARELTO 20 MG TABS tablet TAKE ONE TABLET BY MOUTH ONE TIME  DAILY 30 tablet 7   No current facility-administered medications for this visit.     Allergies: Allergies  Allergen Reactions  . Sulfonamide Derivatives Anaphylaxis  . Lipitor [Atorvastatin] Other (See Comments)    Caused bladder infection  . Pravachol [Pravastatin Sodium] Other (See Comments)    Myalgias  . Statins Other (See Comments)    Myalgia    Social History: The patient  reports that she quit smoking about 37 years ago. Her smoking use included Cigarettes. She has a 1.80 pack-year smoking history. She has never used smokeless tobacco. She reports that she drinks about 8.4 oz of alcohol per week . She reports that she does not use drugs.   Family History: The patient's family history includes Asthma in her father; Diabetes in her father; Heart disease in her brother, father, and mother; Hyperlipidemia in her brother, brother, and sister.   Review of Systems: Please see the history of present illness.   Otherwise, the review of systems is positive for none.   All other systems are reviewed and negative.   Physical Exam: VS:  BP 116/78 (BP Location: Left Arm, Patient Position: Sitting, Cuff Size: Small)   Pulse 80   Ht 5\' 6"  (1.676 m)   Wt 179 lb (81.2 kg)   SpO2 96%   BMI 28.89 kg/m  .  BMI Body mass index is 28.89 kg/m.  Wt Readings from Last 3 Encounters:  07/02/16 179 lb (81.2 kg)  03/26/16 178 lb 6.4 oz (80.9 kg)  03/17/16 178 lb 3.2 oz (80.8 kg)    General: Pleasant. Well developed, well nourished and in no acute distress.   HEENT: Normal.  Neck: Supple, no JVD, carotid bruits, or masses noted.  Cardiac: Irregular irregular rhythm. Rate is fast - about 120 by me.   No murmurs, rubs, or gallops. No edema.  Respiratory:  Lungs are clear to auscultation bilaterally with normal work of breathing.  GI: Soft and nontender.  MS: No deformity or atrophy. Gait and ROM intact.  Skin: Warm and dry. Color is normal.  Neuro:  Strength and sensation are intact and no  gross focal deficits noted.  Psych: Alert, appropriate and with normal affect.   LABORATORY DATA:  EKG:  EKG is not ordered today.  Lab Results  Component Value Date   WBC 4.6 03/18/2016   HGB 12.0 03/18/2016   HCT 35.1 (L) 03/18/2016   PLT 350 03/18/2016   GLUCOSE 93 03/18/2016   CHOL 208 (H) 03/17/2016   TRIG 94 03/17/2016   HDL 39 (L) 03/17/2016   LDLDIRECT 163.2 01/19/2013   LDLCALC 150 (H) 03/17/2016   ALT 13  09/26/2015   AST 16 09/26/2015   NA 141 03/18/2016   K 4.2 03/18/2016   CL 111 03/18/2016   CREATININE 0.78 03/18/2016   BUN 12 03/18/2016   CO2 24 03/18/2016   INR 1.40 03/17/2016   HGBA1C 5.6 01/11/2014    BNP (last 3 results) No results for input(s): BNP in the last 8760 hours.  ProBNP (last 3 results) No results for input(s): PROBNP in the last 8760 hours.   Other Studies Reviewed Today:  Cardiac Cath Conclusion from 02/2016     Mid RCA lesion, 20% stenosed.  Mid Cx lesion, 20% stenosed.  Dist LAD lesion, 50% stenosed.  Mid LAD lesion, 20% stenosed.  Ost LAD lesion, 30% stenosed.  There is moderate to severe left ventricular systolic dysfunction.  1. Mild to moderate nonobstructive coronary artery disease. 2. Moderately to severely reduced LV systolic function with an ejection fraction of 30-35% with moderate mitral regurgitation. 3. Normal LVEDP.   Recommendations: I don't see a culprit for unstable angina. The patient clearly has worsening LV systolic dysfunction. I wonder if this is related to tachycardia-induced cardiomyopathy given that the patient has been in atrial fibrillation over the last 6-12 months. I discontinued flecainide and added metoprolol which can be switched to long-acting before discharge. I resumed ramipril. Xarelto can be resumed tomorrow.    Echo Study Conclusions from 02/2016  - Left ventricle: The cavity size was normal. Systolic function was  normal. The estimated ejection fraction was 55%. Wall motion  was  normal; there were no regional wall motion abnormalities. Normal  sinus rhythm was absent. The study is not technically sufficient  to allow evaluation of LV diastolic function. - Aortic valve: Moderately calcified annulus. Trileaflet; mildly  thickened, mildly calcified leaflets. - Mitral valve: There was mild regurgitation. - Left atrium: The atrium was moderately dilated. - Pulmonary arteries: PA peak pressure: 32 mm Hg (S).   Carotid Doppler Summary 2015: Bilateral: intimal wall thickening CCA. Mild soft plaque origin ICA. 1-39% ICA stenosis. Vertebral artery flow is antegrade.  Other specific details can be found in the table(s) above. Prepared and Electronically Authenticated by  Antony Contras 2015-03-30T10:55:00.567   Assessment / Plan:  1. CAD with angina/Chest pain:  Cardiac cath 03/17/16 with mild to moderate non-obstructive CAD. Medical management of CAD is recommended. LVEF noted to be reduced on LV gram but echo the same day shows normal LV function. Dr. Angelena Form reviewed the echo images and the inferior wall moves well. He estimates her overall LVEF by echo to be 50%. The abnormality on LV function noted on LV gram may have been due to PVCs just before the imaging. Clinically she is doing well. She does not wish to pursue other testing at this time - cardiac MRI. I wondered if her presentation may be more Takotsubo like - her mother had just died prior to this event. She is doing well clinically.   2. PAF: She is now managed with rate control and continues on her Xarelto.  This patients CHA2DS2-VASc Score and unadjusted Ischemic Stroke Rate (% per year) is equal to 7.2 % stroke rate/year from a score of 5 (Age, Female, HTN, CVA(2)). She is in AF today by exam - out of her metoprolol - dose given here to her today. Refilled Metoprolol as well.   3. HLD: intolerant to statins. Continue Fenofibrate.  4. HTN: BP controlled on her current regimen.   5.  Chronic anticoagulation - no problems noted.   6. Prior stroke -  minimal residual deficit.   Current medicines are reviewed with the patient today.  The patient does not have concerns regarding medicines other than what has been noted above.  The following changes have been made:  See above.  Labs/ tests ordered today include:   No orders of the defined types were placed in this encounter.    Disposition:   FU with me in 6 months with fasting labs.    Patient is agreeable to this plan and will call if any problems develop in the interim.   Signed: Burtis Junes, RN, ANP-C 07/02/2016 9:37 AM  [facility] [address] [place], [place]  [address] Phone: [phone] Fax: [phone]

## 2016-09-14 ENCOUNTER — Encounter: Payer: Self-pay | Admitting: Nurse Practitioner

## 2016-09-21 ENCOUNTER — Ambulatory Visit (INDEPENDENT_AMBULATORY_CARE_PROVIDER_SITE_OTHER): Payer: Medicare Other | Admitting: Nurse Practitioner

## 2016-09-21 ENCOUNTER — Encounter: Payer: Self-pay | Admitting: Nurse Practitioner

## 2016-09-21 ENCOUNTER — Telehealth: Payer: Self-pay | Admitting: Emergency Medicine

## 2016-09-21 ENCOUNTER — Encounter (INDEPENDENT_AMBULATORY_CARE_PROVIDER_SITE_OTHER): Payer: Self-pay

## 2016-09-21 VITALS — BP 122/78 | HR 72 | Ht 66.0 in | Wt 181.1 lb

## 2016-09-21 DIAGNOSIS — Z860100 Personal history of colon polyps, unspecified: Secondary | ICD-10-CM

## 2016-09-21 DIAGNOSIS — R141 Gas pain: Secondary | ICD-10-CM

## 2016-09-21 DIAGNOSIS — R140 Abdominal distension (gaseous): Secondary | ICD-10-CM

## 2016-09-21 NOTE — Telephone Encounter (Signed)
Patient informed and verbalized understanding

## 2016-09-21 NOTE — Telephone Encounter (Signed)
Thank you :)

## 2016-09-21 NOTE — Telephone Encounter (Signed)
09/21/2016   RE: Alexandra Pierce [facility] DOB: September 19, 1946 MRN: [id number]   Dear Dr. Servando Snare,    We have scheduled the above patient for an endoscopic procedure. Our records show that she is on anticoagulation therapy.   Please advise as to how long the patient may come off her therapy of Xarelto prior to the procedure, which is scheduled for 10/29/16.  Please fax back/ or route the completed form to [place], [place].   Sincerely,    Tinnie Gens, [place]

## 2016-09-21 NOTE — Telephone Encounter (Signed)
Yes I will call the patient!  Thank you!

## 2016-09-21 NOTE — Patient Instructions (Addendum)
You have been scheduled for a colonoscopy. Please follow written instructions given to you at your visit today.  Please pick up your prep supplies at the pharmacy within the next 1-3 days. If you use inhalers (even only as needed), please bring them with you on the day of your procedure. Your physician has requested that you go to www.startemmi.com and enter the access code given to you at your visit today. This web site gives a general overview about your procedure. However, you should still follow specific instructions given to you by our office regarding your preparation for the procedure.  We have given you a handout on a low fodmap diet.   Please purchase the following medications over the counter and take as directed: IB gard

## 2016-09-21 NOTE — Progress Notes (Signed)
Chief complaint: Bloating, pain and gas.  HPI: Patient is a 70 year old female known remotely to Dr. Carlean Purl for history of adenomatous colon polyps, she is overdue for screening colonoscopy but presents today for evaluation of bloating and gas. These symptoms have been present for years and years, she just wants to see if any treatment options are available. Bloating and gas occur independently of meals but certain food such as beans and breads exacerbate the problem.  Her bowel movements are occasionally irregular but otherwise fine. No blood in stool. No nausea, vomiting, or weight loss.  She recently started [employer]    Past Medical History:  Diagnosis Date  . Atrial fibrillation ([place])   . Chronic anticoagulation   . Chronic lower back pain   . Hypercholesteremia   . Hypertension    mild  . Obesity   . Paroxysmal atrial fibrillation ([facility])    managed with anticoagulation and rate control.   . Stroke [facility]) 2001 & 2015   "right side gets weak sometimes if I'm only tired" (03/16/2016)     Past Surgical History:  Procedure Laterality Date  . CARDIAC CATHETERIZATION  03/31/2000   EF 49%/LAD lg vessel which coursed to the apex & gave rise to 2 diagonal branches/ LAD noted to have 30% ostial lesion & tapers to a sm vessel toward the apex but no high grade lesion/1st diagonal med sized vessel & 2nd diagonal sm vessel with no significant disease/ lt ventriculogram reveals mild global hypokinesis w/ an estimated EF of 45-50%  . CARDIAC CATHETERIZATION N/A 03/17/2016   Procedure: Left Heart Cath and Coronary Angiography;  Surgeon: Wellington Hampshire, MD;  Location: [facility];  Service: Cardiovascular;  Laterality: N/A;   Family History  Problem Relation Age of Onset  . Heart disease Father   . Asthma Father   . Diabetes Father   . Heart disease Mother   . Heart disease Brother   . Hyperlipidemia Sister   . Hyperlipidemia Brother   . Hyperlipidemia Brother    Social History    Substance Use Topics  . Smoking status: Former Smoker    Packs/day: 0.12    Years: 15.00    Types: Cigarettes    Quit date: 10/18/1978  . Smokeless tobacco: Never Used  . Alcohol use 8.4 oz/week    14 Glasses of wine per week   Current Outpatient Prescriptions  Medication Sig Dispense Refill  . acetaminophen (TYLENOL) 500 MG tablet Take 1,000 mg by mouth as needed for mild pain or headache.     . cholecalciferol (VITAMIN D) 1000 UNITS tablet Take 1,000 Units by mouth daily.    . fenofibrate 160 MG tablet TAKE ONE TABLET BY MOUTH ONCE DAILY 90 tablet 3  . metoprolol tartrate (LOPRESSOR) 25 MG tablet Take 1 tablet (25 mg total) by mouth 2 (two) times daily. 180 tablet 3  . ramipril (ALTACE) 2.5 MG capsule Take 2.5 mg by mouth 2 (two) times daily. Two tablets ( 5 mg ) in the am one tablet (2.5 mg) in the pm    . XARELTO 20 MG TABS tablet TAKE ONE TABLET BY MOUTH ONE TIME DAILY 30 tablet 7   Current Facility-Administered Medications  Medication Dose Route Frequency Provider Last Rate Last Dose  . metoprolol tartrate (LOPRESSOR) tablet 25 mg  25 mg Oral Once Burtis Junes, NP       Allergies  Allergen Reactions  . Sulfonamide Derivatives Anaphylaxis  . Lipitor [Atorvastatin] Other (See Comments)  Caused bladder infection  . Pravachol [Pravastatin Sodium] Other (See Comments)    Myalgias  . Statins Other (See Comments)    Myalgia     Review of Systems: Positive for allergy, sinus problem, back pain, itching, and shortness of breath . All other systems reviewed and negative except where noted in HPI.    Physical Exam: BP 122/78   Pulse 72   Ht 5\' 6"  (1.676 m)   Wt 181 lb 2 oz (82.2 kg)   BMI 29.23 kg/m  Constitutional:  Well-developed, white female in no acute distress. Psychiatric: Normal mood and affect. Behavior is normal. HEENT: Normocephalic and atraumatic. Conjunctivae are normal. No scleral icterus. Neck supple.  Cardiovascular: Normal rate, regular rhythm.   Pulmonary/chest: Effort normal and breath sounds normal. No wheezing, rales or rhonchi. Abdominal: Soft, nondistended, nontender. Bowel sounds active throughout. There are no masses palpable. No hepatomegaly. Extremities: no edema Lymphadenopathy: No cervical adenopathy noted. Neurological: Alert and oriented to person place and time. Skin: Skin is warm and dry. No rashes noted.   ASSESSMENT AND PLAN: 39. 70 year old female with chronic (years) bloating and gas.  No changes in symptoms just tired of dealing with them.  Recently started Align probiotics.-continue Align -sample if IBgard and coupon given -low fodmap diet  2. Hx of adenomatous colon polyps. She is almost 5 years overdue for surveillance. No alarm features such as rectal bleeding, bowel changes or blood in stool. Her weight is stable. -The risks and benefits of surveillance colonoscopy explained to the patient. She agrees to proceed. Colonoscopy will be done after the holidays. Xarelto will need to be held for procedure, see #2.   3. Atrial fibrillation / hx of CVAs, on Xarelto. Advised to hold Xarelto 24 hours prior to colonoscopy- will instruct when and how to resume after procedure. Patient understands there is a low but real risk of cardiovascular event such as heart attack, stroke, embolism, thrombosis or ischemia/infarct of other organs off Xarelto. The patient consents to proceed. Communicated by EMR with Cardiology and they are comfortable holding Xarelto for one day prior to procedure and an additional day if polyps removed.    Tye Savoy  09/21/2016, 9:55 AM

## 2016-09-21 NOTE — Telephone Encounter (Signed)
I have discussed her case with the pharm D here in our office - the patient has had prior strokes - recommended that for her colonoscopy that she hold one dose of Xarelto only. If no polyps removed - restart the evening of the procedure. If polyps are removed - restart the following day (therefore would have held for 2 days total).   Will you be informing the patient of this recommendation?

## 2016-09-25 NOTE — Progress Notes (Signed)
Agree with Ms. Guenther's assessment and plan. Christabelle Hanzlik E. Suellen Durocher, MD, FACG   

## 2016-10-29 ENCOUNTER — Encounter: Payer: Self-pay | Admitting: Internal Medicine

## 2016-10-29 ENCOUNTER — Ambulatory Visit (AMBULATORY_SURGERY_CENTER): Payer: Medicare Other | Admitting: Internal Medicine

## 2016-10-29 VITALS — BP 124/99 | HR 86 | Temp 97.5°F | Resp 13 | Ht 66.0 in | Wt 181.0 lb

## 2016-10-29 DIAGNOSIS — Z86010 Personal history of colonic polyps: Secondary | ICD-10-CM

## 2016-10-29 MED ORDER — SODIUM CHLORIDE 0.9 % IV SOLN: 500.0000 mL | INTRAVENOUS | Status: DC

## 2016-10-29 NOTE — Op Note (Signed)
[facility] Patient Name: Alexandra Pierce Procedure Date: 10/29/2016 3:46 PM MRN: [id number] Endoscopist: Gatha Mayer , MD Age: 71 Referring MD:  Date of Birth: 06-May-1946 Gender: Female Account #: [id number] Procedure:                Colonoscopy Indications:              Surveillance: Personal history of adenomatous                            polyps on last colonoscopy > 5 years ago Medicines:                Propofol per Anesthesia, Monitored Anesthesia Care Procedure:                Pre-Anesthesia Assessment:                           - Prior to the procedure, a History and Physical                            was performed, and patient medications and                            allergies were reviewed. The patient's tolerance of                            previous anesthesia was also reviewed. The risks                            and benefits of the procedure and the sedation                            options and risks were discussed with the patient.                            All questions were answered, and informed consent                            was obtained. Prior Anticoagulants: The patient                            last took Xarelto (rivaroxaban) 1 day prior to the                            procedure. ASA Grade Assessment: III - A patient                            with severe systemic disease. After reviewing the                            risks and benefits, the patient was deemed in                            satisfactory condition to undergo the procedure.  After obtaining informed consent, the colonoscope                            was passed under direct vision. Throughout the                            procedure, the patient's blood pressure, pulse, and                            oxygen saturations were monitored continuously. The                            Model CF-HQ190L [id number]) scope was introduced       through the anus and advanced to the the cecum,                            identified by appendiceal orifice and ileocecal                            valve. The colonoscopy was performed without                            difficulty. The patient tolerated the procedure                            well. The quality of the bowel preparation was                            good. The ileocecal valve, appendiceal orifice, and                            rectum were photographed. The bowel preparation                            used was Miralax. Scope In: 3:54:30 PM Scope Out: 4:08:17 PM Scope Withdrawal Time: 0 hours 9 minutes 29 seconds  Total Procedure Duration: 0 hours 13 minutes 47 seconds  Findings:                 The perianal and digital rectal examinations were                            normal.                           The entire examined colon appeared normal on direct                            and retroflexion views. Complications:            No immediate complications. Estimated Blood Loss:     Estimated blood loss: none. Impression:               - The entire examined colon is normal on direct and  retroflexion views.                           - No specimens collected. Recommendation:           - Patient has a contact number available for                            emergencies. The signs and symptoms of potential                            delayed complications were discussed with the                            patient. Return to normal activities tomorrow.                            Written discharge instructions were provided to the                            patient.                           - Resume previous diet.                           - Continue present medications.                           - Resume Xarelto (rivaroxaban) at prior dose today.                           - No repeat colonoscopy due to age. Gatha Mayer, MD 10/29/2016  4:14:16 PM This report has been signed electronically.

## 2016-10-29 NOTE — Progress Notes (Signed)
To recovery, report to Scott, RN, VSS 

## 2016-10-29 NOTE — Patient Instructions (Addendum)
YOU HAD AN ENDOSCOPIC PROCEDURE TODAY AT [facility]:   Refer to the procedure report that was given to you for any specific questions about what was found during the examination.  If the procedure report does not answer your questions, please call your gastroenterologist to clarify.  If you requested that your care partner not be given the details of your procedure findings, then the procedure report has been included in a sealed envelope for you to review at your convenience later.  YOU SHOULD EXPECT: Some feelings of bloating in the abdomen. Passage of more gas than usual.  Walking can help get rid of the air that was put into your GI tract during the procedure and reduce the bloating. If you had a lower endoscopy (such as a colonoscopy or flexible sigmoidoscopy) you may notice spotting of blood in your stool or on the toilet paper. If you underwent a bowel prep for your procedure, you may not have a normal bowel movement for a few days.  Please Note:  You might notice some irritation and congestion in your nose or some drainage.  This is from the oxygen used during your procedure.  There is no need for concern and it should clear up in a day or so.  SYMPTOMS TO REPORT IMMEDIATELY:   Following lower endoscopy (colonoscopy or flexible sigmoidoscopy):  Excessive amounts of blood in the stool  Significant tenderness or worsening of abdominal pains  Swelling of the abdomen that is new, acute  Fever of 100F or higher   Following upper endoscopy (EGD)  Vomiting of blood or coffee ground material  New chest pain or pain under the shoulder blades  Painful or persistently difficult swallowing  New shortness of breath  Fever of 100F or higher  Black, tarry-looking stools  For urgent or emergent issues, a gastroenterologist can be reached at any hour by calling [phone].   DIET:  We do recommend a small meal at first, but then you may proceed to your regular diet.  Drink  plenty of fluids but you should avoid alcoholic beverages for 24 hours.  ACTIVITY:  You should plan to take it easy for the rest of today and you should NOT DRIVE or use heavy machinery until tomorrow (because of the sedation medicines used during the test).    FOLLOW UP: Our staff will call the number listed on your records the next business day following your procedure to check on you and address any questions or concerns that you may have regarding the information given to you following your procedure. If we do not reach you, we will leave a message.  However, if you are feeling well and you are not experiencing any problems, there is no need to return our call.  We will assume that you have returned to your regular daily activities without incident.  If any biopsies were taken you will be contacted by phone or by letter within the next 1-3 weeks.  Please call us at [phone] if you have not heard about the biopsies in 3 weeks.    SIGNATURES/CONFIDENTIALITY: You and/or your care partner have signed paperwork which will be entered into your electronic medical record.  These signatures attest to the fact that that the information above on your After Visit Summary has been reviewed and is understood.  Full responsibility of the confidentiality of this discharge information lies with you and/or your care-partner.         No polyps today.  I do not recommend another routine colonoscopy for you.  Try changing probiotics and consider re-trying Beano as well as lower dose of IB Gard for the bloating.  I appreciate the opportunity to care for you. Gatha Mayer, MD, [facility]

## 2016-11-01 ENCOUNTER — Telehealth: Payer: Self-pay

## 2016-11-01 NOTE — Telephone Encounter (Signed)
  Follow up Call-  Call back number 10/29/2016  Post procedure Call Back phone  # [phone]  Permission to leave phone message Yes  Some recent data might be hidden     Patient questions:  Do you have a fever, pain , or abdominal swelling? No. Pain Score  0 *  Have you tolerated food without any problems? Yes.    Have you been able to return to your normal activities? Yes.    Do you have any questions about your discharge instructions: Diet   No. Medications  No. Follow up visit  No.  Do you have questions or concerns about your Care? No.  Actions: * If pain score is 4 or above: No action needed, pain <4.

## 2016-11-27 ENCOUNTER — Other Ambulatory Visit: Payer: Self-pay | Admitting: Internal Medicine

## 2016-12-28 ENCOUNTER — Ambulatory Visit: Payer: Medicare Other | Admitting: Nurse Practitioner

## 2017-03-15 ENCOUNTER — Telehealth: Payer: Self-pay | Admitting: *Deleted

## 2017-03-15 NOTE — Telephone Encounter (Signed)
lvm moved pt's appointment to tomorrow due to emergency with provider.

## 2017-03-16 ENCOUNTER — Ambulatory Visit: Payer: Medicare Other | Admitting: Nurse Practitioner

## 2017-03-16 NOTE — Progress Notes (Deleted)
CARDIOLOGY OFFICE NOTE  Date:  03/16/2017    Alexandra Pierce Date of Birth: 07-07-1946 Medical Record #[id number]  PCP:  Antony Blackbird, MD  Cardiologist:  Servando Snare & ***    No chief complaint on file.   History of Present Illness: Alexandra Pierce is a 71 y.o. female who presents today for a ***  She is seen for Dr. Rayann Heman - former patient of Dr.Tennant's.   She has PAF, remote stroke, and on chronic anticoagulation - now on Xarelto due to labile readings with her coumadin. Other issues include HLD, HTN and obesity. She has had statin intolerance. Prior cath in 2001 with mild CAD and EF of 45 to 50%.  Hospitalized back in March of 2015 with recurrent stroke - multiple infarcts on MRI. Had missed several doses of her Xarelto. Was in atrial fib on arrival to the ER at that time. Understood the need to NOT miss her Xarelto. She had been traveling and really got out of her routine when that happened.   I had Dr. Rayann Heman review her medicines - he felt her current regimen was ok and that if she had worsening symptoms of afib then we could consider another antiarrhythmic drug.   I saw her in December of 2016 and she was doing ok with very little AF.   Discharged from [facility] on June 1 of 2017 - had chest pain - was in atrial fib - she was cathed - EF had dropped but was normal by echo. Mild to moderate non obstructive CAD noted on cath with medical management recommended. Flecainide was stopped, metoprolol added and she is now managed with rate control and continued anticoagulation.   I saw her for her post hospital visit - she was doing well.   Comes back today - here alone. She is doing well. She notes she has ran out of her Metoprolol - could not get refilled - looks like the pharmacy was trying to reach the hospitalist and not [place]. She does feel like this causes some cramps at night. Some palpitations now - especially had last night. She actually took a dose (perhaps 2) of her  Flecainide to "calm it down". Remains on her Xarelto and no missed doses. She is happy. Her significant other has moved in. They are adjusting but happy.   Comes in today. Here with   Past Medical History:  Diagnosis Date  . Atrial fibrillation ([place])   . Chronic anticoagulation   . Chronic lower back pain   . Hypercholesteremia   . Hypertension    mild  . Obesity   . Paroxysmal atrial fibrillation ([facility])    managed with anticoagulation and rate control.   . Stroke [facility]) 2001 & 2015   "right side gets weak sometimes if I'm only tired" (03/16/2016)    Past Surgical History:  Procedure Laterality Date  . CARDIAC CATHETERIZATION  03/31/2000   EF 49%/LAD lg vessel which coursed to the apex & gave rise to 2 diagonal branches/ LAD noted to have 30% ostial lesion & tapers to a sm vessel toward the apex but no high grade lesion/1st diagonal med sized vessel & 2nd diagonal sm vessel with no significant disease/ lt ventriculogram reveals mild global hypokinesis w/ an estimated EF of 45-50%  . CARDIAC CATHETERIZATION N/A 03/17/2016   Procedure: Left Heart Cath and Coronary Angiography;  Surgeon: Wellington Hampshire, MD;  Location: [facility] [place] [facility];  Service: Cardiovascular;  Laterality: N/A;  Medications: Current Outpatient Prescriptions  Medication Sig Dispense Refill  . acetaminophen (TYLENOL) 500 MG tablet Take 1,000 mg by mouth as needed for mild pain or headache.     . cholecalciferol (VITAMIN D) 1000 UNITS tablet Take 1,000 Units by mouth daily.    . fenofibrate 160 MG tablet TAKE ONE TABLET BY MOUTH ONCE DAILY 90 tablet 3  . metoprolol tartrate (LOPRESSOR) 25 MG tablet Take 1 tablet (25 mg total) by mouth 2 (two) times daily. 180 tablet 3  . ramipril (ALTACE) 2.5 MG capsule Take 2.5 mg by mouth 2 (two) times daily. Two tablets ( 5 mg ) in the am one tablet (2.5 mg) in the pm    . XARELTO 20 MG TABS tablet TAKE ONE TABLET BY MOUTH ONE TIME DAILY 30 tablet 5   Current  Facility-Administered Medications  Medication Dose Route Frequency Provider Last Rate Last Dose  . 0.9 %  sodium chloride infusion  500 mL Intravenous Continuous Gatha Mayer, MD      . 0.9 %  sodium chloride infusion  500 mL Intravenous Continuous Gatha Mayer, MD      . metoprolol tartrate (LOPRESSOR) tablet 25 mg  25 mg Oral Once Burtis Junes, NP        Allergies: Allergies  Allergen Reactions  . Sulfonamide Derivatives Anaphylaxis  . Lipitor [Atorvastatin] Other (See Comments)    Caused bladder infection  . Pravachol [Pravastatin Sodium] Other (See Comments)    Myalgias  . Statins Other (See Comments)    Myalgia    Social History: The patient  reports that she quit smoking about 38 years ago. Her smoking use included Cigarettes. She has a 1.80 pack-year smoking history. She has never used smokeless tobacco. She reports that she drinks about 8.4 oz of alcohol per week . She reports that she does not use drugs.   Family History: The patient's ***family history includes Asthma in her father; Diabetes in her father; Heart disease in her brother, father, and mother; Hyperlipidemia in her brother, brother, and sister.   Review of Systems: Please see the history of present illness.   Otherwise, the review of systems is positive for {NONE DEFAULTED:18576::"none"}.   All other systems are reviewed and negative.   Physical Exam: VS:  There were no vitals taken for this visit. [name] Kitchen  BMI There is no height or weight on file to calculate BMI.  Wt Readings from Last 3 Encounters:  10/29/16 181 lb (82.1 kg)  09/21/16 181 lb 2 oz (82.2 kg)  07/02/16 179 lb (81.2 kg)    General: Pleasant. Well developed, well nourished and in no acute distress.   HEENT: Normal.  Neck: Supple, no JVD, carotid bruits, or masses noted.  Cardiac: ***Regular rate and rhythm. No murmurs, rubs, or gallops. No edema.  Respiratory:  Lungs are clear to auscultation bilaterally with normal work of breathing.    GI: Soft and nontender.  MS: No deformity or atrophy. Gait and ROM intact.  Skin: Warm and dry. Color is normal.  Neuro:  Strength and sensation are intact and no gross focal deficits noted.  Psych: Alert, appropriate and with normal affect.   LABORATORY DATA:  EKG:  EKG {ACTION; IS/IS [id number]} ordered today. This demonstrates ***.  Lab Results  Component Value Date   WBC 4.6 03/18/2016   HGB 12.0 03/18/2016   HCT 35.1 (L) 03/18/2016   PLT 350 03/18/2016   GLUCOSE 93 03/18/2016   CHOL 208 (H) 03/17/2016  TRIG 94 03/17/2016   HDL 39 (L) 03/17/2016   LDLDIRECT 163.2 01/19/2013   LDLCALC 150 (H) 03/17/2016   ALT 13 09/26/2015   AST 16 09/26/2015   NA 141 03/18/2016   K 4.2 03/18/2016   CL 111 03/18/2016   CREATININE 0.78 03/18/2016   BUN 12 03/18/2016   CO2 24 03/18/2016   INR 1.40 03/17/2016   HGBA1C 5.6 01/11/2014    BNP (last 3 results) No results for input(s): BNP in the last 8760 hours.  ProBNP (last 3 results) No results for input(s): PROBNP in the last 8760 hours.   Other Studies Reviewed Today:   Assessment/Plan: Cardiac Cath Conclusion from 02/2016    Mid RCA lesion, 20% stenosed.  Mid Cx lesion, 20% stenosed.  Dist LAD lesion, 50% stenosed.  Mid LAD lesion, 20% stenosed.  Ost LAD lesion, 30% stenosed.  There is moderate to severe left ventricular systolic dysfunction.  1. Mild to moderate nonobstructive coronary artery disease. 2. Moderately to severely reduced LV systolic function with an ejection fraction of 30-35% with moderate mitral regurgitation. 3. Normal LVEDP.   Recommendations: I don't see a culprit for unstable angina. The patient clearly has worsening LV systolic dysfunction. I wonder if this is related to tachycardia-induced cardiomyopathy given that the patient has been in atrial fibrillation over the last 6-12 months. I discontinued flecainide and added metoprolol which can be switched to long-acting before discharge. I  resumed ramipril. Xarelto can be resumed tomorrow.    Echo Study Conclusions from 02/2016  - Left ventricle: The cavity size was normal. Systolic function was  normal. The estimated ejection fraction was 55%. Wall motion was  normal; there were no regional wall motion abnormalities. Normal  sinus rhythm was absent. The study is not technically sufficient  to allow evaluation of LV diastolic function. - Aortic valve: Moderately calcified annulus. Trileaflet; mildly  thickened, mildly calcified leaflets. - Mitral valve: There was mild regurgitation. - Left atrium: The atrium was moderately dilated. - Pulmonary arteries: PA peak pressure: 32 mm Hg (S).   Carotid Doppler Summary 2015: Bilateral: intimal wall thickening CCA. Mild soft plaque origin ICA. 1-39% ICA stenosis. Vertebral artery flow is antegrade.  Other specific details can be found in the table(s) above. Prepared and Electronically Authenticated by  Antony Contras 2015-03-30T10:55:00.567   Assessment / Plan:  1. CAD with angina/Chest pain: Cardiac cath 03/17/16 with mild to moderate non-obstructive CAD. Medical management of CAD is recommended. LVEF noted to be reduced on LV gram but echo the same day shows normal LV function. Dr. Angelena Form reviewed the echo images and the inferior wall moves well. He estimates her overall LVEF by echo to be 50%. The abnormality on LV function noted on LV gram may have been due to PVCs just before the imaging. Clinically she is doing well. She does not wish to pursue other testing at this time - cardiac MRI. I wondered if her presentation may be more Takotsubo like - her mother had just died prior to this event. She is doing well clinically.   2. PAF: She is now managed with rate control and continues on her Xarelto. This patients CHA2DS2-VASc Score and unadjusted Ischemic Stroke Rate (% per year) is equal to 7.2 % stroke rate/year from a score of 5 (Age, Female, HTN, CVA(2)).  She is in AF today by exam - out of her metoprolol - dose given here to her today. Refilled Metoprolol as well.   3. HLD: intolerant to statins. Continue Fenofibrate.  4. HTN:BP controlled on her current regimen.   5. Chronic anticoagulation - no problems noted.   6. Prior stroke - minimal residual deficit.   Current medicines are reviewed with the patient today.  The patient does not have concerns regarding medicines other than what has been noted above.  The following changes have been made:  See above.  Labs/ tests ordered today include:   No orders of the defined types were placed in this encounter.    Disposition:   FU with *** in {gen number 0-21:115520} {Days to years:10300}.   Patient is agreeable to this plan and will call if any problems develop in the interim.   SignedTruitt Merle, NP  03/16/2017 7:23 AM  [facility] [address] [place], [place]  [address] Phone: [phone] Fax: [phone]

## 2017-03-16 NOTE — Telephone Encounter (Signed)
lvm and put pt on schedule twice pt has not gotten back to office about visit today. Also put pt on schedule for Friday, June 1 at 9:30 and lvm to see if pt might be able to come in that day.

## 2017-03-18 ENCOUNTER — Ambulatory Visit: Payer: Medicare Other | Admitting: Nurse Practitioner

## 2017-03-18 ENCOUNTER — Telehealth: Payer: Self-pay | Admitting: *Deleted

## 2017-03-18 ENCOUNTER — Other Ambulatory Visit: Payer: Self-pay | Admitting: *Deleted

## 2017-03-18 MED ORDER — METOPROLOL TARTRATE 25 MG PO TABS: 25.0000 mg | ORAL_TABLET | Freq: Two times a day (BID) | ORAL | 0 refills | Status: DC

## 2017-03-18 NOTE — Telephone Encounter (Signed)
S/w pt showed up late for appt today, asked to send in metoprolol (25 mg ) bid.  Was sent in to pt's requested pharmacy at [facility].  Pt did reschedule and apologized for being late. Pt has a lot going on with husband.

## 2017-03-18 NOTE — Progress Notes (Deleted)
CARDIOLOGY OFFICE NOTE  Date:  03/18/2017    Alexandra Pierce Date of Birth: [birth year] Medical Record #[id number]  PCP:  Antony Blackbird, MD  Cardiologist:  Servando Snare & [place]    No chief complaint on file.   History of Present Illness: Alexandra Pierce is a 71 y.o. female who presents today for a follow up visit. She is seen for Dr. Rayann Heman - former patient of Dr.Tennant's.   She has PAF, remote stroke, and on chronic anticoagulation - now on Xarelto due to labile readings with her coumadin. Other issues include HLD, HTN and obesity. She has had statin intolerance. Prior cath in 2001 with mild CAD and EF of 45 to 50%.  Hospitalized back in March of 2015 with recurrent stroke - multiple infarcts on MRI. Had missed several doses of her Xarelto. Was in atrial fib on arrival to the ER at that time. Understood the need to NOT miss her Xarelto. She had been traveling and really got out of her routine when that happened.   I had Dr. Rayann Heman review her medicines in the past - he felt her current regimen was ok and that if she had worsening symptoms of afib then we could consider another antiarrhythmic drug.   Discharged from [facility] on June 1 of 2017 - had chest pain - was in atrial fib - she was cathed - EF had dropped but was normal by echo. Mild to moderate non obstructive CAD noted on cath with medical management recommended. Flecainide was stopped, metoprolol added and she is now managed with rate control and continued anticoagulation.  I last saw her back in September of 2017 - she was doing well but had ran out of Metoprolol - trouble getting refilled but overall felt to be doing ok.    Comes back today - here alone.   Past Medical History:  Diagnosis Date  . Atrial fibrillation ([place])   . Chronic anticoagulation   . Chronic lower back pain   . Hypercholesteremia   . Hypertension    mild  . Obesity   . Paroxysmal atrial fibrillation ([facility])    managed with anticoagulation and rate  control.   . Stroke [facility]) 2001 & 2015   "right side gets weak sometimes if I'm only tired" (03/16/2016)    Past Surgical History:  Procedure Laterality Date  . CARDIAC CATHETERIZATION  03/31/2000   EF 49%/LAD lg vessel which coursed to the apex & gave rise to 2 diagonal branches/ LAD noted to have 30% ostial lesion & tapers to a sm vessel toward the apex but no high grade lesion/1st diagonal med sized vessel & 2nd diagonal sm vessel with no significant disease/ lt ventriculogram reveals mild global hypokinesis w/ an estimated EF of 45-50%  . CARDIAC CATHETERIZATION N/A 03/17/2016   Procedure: Left Heart Cath and Coronary Angiography;  Surgeon: Wellington Hampshire, MD;  Location: [facility];  Service: Cardiovascular;  Laterality: N/A;     Medications: Current Outpatient Prescriptions  Medication Sig Dispense Refill  . acetaminophen (TYLENOL) 500 MG tablet Take 1,000 mg by mouth as needed for mild pain or headache.     . cholecalciferol (VITAMIN D) 1000 UNITS tablet Take 1,000 Units by mouth daily.    . fenofibrate 160 MG tablet TAKE ONE TABLET BY MOUTH ONCE DAILY 90 tablet 3  . metoprolol tartrate (LOPRESSOR) 25 MG tablet Take 1 tablet (25 mg total) by mouth 2 (two) times daily. 180 tablet 3  . ramipril (  ALTACE) 2.5 MG capsule Take 2.5 mg by mouth 2 (two) times daily. Two tablets ( 5 mg ) in the am one tablet (2.5 mg) in the pm    . XARELTO 20 MG TABS tablet TAKE ONE TABLET BY MOUTH ONE TIME DAILY 30 tablet 5   Current Facility-Administered Medications  Medication Dose Route Frequency Provider Last Rate Last Dose  . 0.9 %  sodium chloride infusion  500 mL Intravenous Continuous Gatha Mayer, MD      . 0.9 %  sodium chloride infusion  500 mL Intravenous Continuous Gatha Mayer, MD      . metoprolol tartrate (LOPRESSOR) tablet 25 mg  25 mg Oral Once Burtis Junes, NP        Allergies: Allergies  Allergen Reactions  . Sulfonamide Derivatives Anaphylaxis  . Lipitor  [Atorvastatin] Other (See Comments)    Caused bladder infection  . Pravachol [Pravastatin Sodium] Other (See Comments)    Myalgias  . Statins Other (See Comments)    Myalgia    Social History: The patient  reports that she quit smoking about 38 years ago. Her smoking use included Cigarettes. She has a 1.80 pack-year smoking history. She has never used smokeless tobacco. She reports that she drinks about 8.4 oz of alcohol per week . She reports that she does not use drugs.   Family History: The patient's family history includes Asthma in her father; Diabetes in her father; Heart disease in her brother, father, and mother; Hyperlipidemia in her brother, brother, and sister.   Review of Systems: Please see the history of present illness.   Otherwise, the review of systems is positive for none.   All other systems are reviewed and negative.   Physical Exam: VS:  There were no vitals taken for this visit. [name] Kitchen  BMI There is no height or weight on file to calculate BMI.  Wt Readings from Last 3 Encounters:  10/29/16 181 lb (82.1 kg)  09/21/16 181 lb 2 oz (82.2 kg)  07/02/16 179 lb (81.2 kg)    General: Pleasant. Well developed, well nourished and in no acute distress.   HEENT: Normal.  Neck: Supple, no JVD, carotid bruits, or masses noted.  Cardiac: Regular rate and rhythm. No murmurs, rubs, or gallops. No edema.  Respiratory:  Lungs are clear to auscultation bilaterally with normal work of breathing.  GI: Soft and nontender.  MS: No deformity or atrophy. Gait and ROM intact.  Skin: Warm and dry. Color is normal.  Neuro:  Strength and sensation are intact and no gross focal deficits noted.  Psych: Alert, appropriate and with normal affect.   LABORATORY DATA:  EKG:  EKG is not ordered today.  Lab Results  Component Value Date   WBC 4.6 03/18/2016   HGB 12.0 03/18/2016   HCT 35.1 (L) 03/18/2016   PLT 350 03/18/2016   GLUCOSE 93 03/18/2016   CHOL 208 (H) 03/17/2016   TRIG 94  03/17/2016   HDL 39 (L) 03/17/2016   LDLDIRECT 163.2 01/19/2013   LDLCALC 150 (H) 03/17/2016   ALT 13 09/26/2015   AST 16 09/26/2015   NA 141 03/18/2016   K 4.2 03/18/2016   CL 111 03/18/2016   CREATININE 0.78 03/18/2016   BUN 12 03/18/2016   CO2 24 03/18/2016   INR 1.40 03/17/2016   HGBA1C 5.6 01/11/2014   No results found for: HEMOGLOBIN   BNP (last 3 results) No results for input(s): BNP in the last 8760 hours.  ProBNP (  last 3 results) No results for input(s): PROBNP in the last 8760 hours.   Other Studies Reviewed Today:  Cardiac Cath Conclusion from 02/2016    Mid RCA lesion, 20% stenosed.  Mid Cx lesion, 20% stenosed.  Dist LAD lesion, 50% stenosed.  Mid LAD lesion, 20% stenosed.  Ost LAD lesion, 30% stenosed.  There is moderate to severe left ventricular systolic dysfunction.  1. Mild to moderate nonobstructive coronary artery disease. 2. Moderately to severely reduced LV systolic function with an ejection fraction of 30-35% with moderate mitral regurgitation. 3. Normal LVEDP.   Recommendations: I don't see a culprit for unstable angina. The patient clearly has worsening LV systolic dysfunction. I wonder if this is related to tachycardia-induced cardiomyopathy given that the patient has been in atrial fibrillation over the last 6-12 months. I discontinued flecainide and added metoprolol which can be switched to long-acting before discharge. I resumed ramipril. Xarelto can be resumed tomorrow.    Echo Study Conclusions from 02/2016  - Left ventricle: The cavity size was normal. Systolic function was  normal. The estimated ejection fraction was 55%. Wall motion was  normal; there were no regional wall motion abnormalities. Normal  sinus rhythm was absent. The study is not technically sufficient  to allow evaluation of LV diastolic function. - Aortic valve: Moderately calcified annulus. Trileaflet; mildly  thickened, mildly calcified leaflets. -  Mitral valve: There was mild regurgitation. - Left atrium: The atrium was moderately dilated. - Pulmonary arteries: PA peak pressure: 32 mm Hg (S).   Carotid Doppler Summary 2015: Bilateral: intimal wall thickening CCA. Mild soft plaque origin ICA. 1-39% ICA stenosis. Vertebral artery flow is antegrade.  Other specific details can be found in the table(s) above. Prepared and Electronically Authenticated by  Antony Contras 2015-03-30T10:55:00.567   Assessment / Plan:  1. CAD with angina/Chest pain: Cardiac cath 03/17/16 with mild to moderate non-obstructive CAD. Medical management of CAD is recommended. LVEF noted to be reduced on LV gram but echo the same day shows normal LV function. Dr. Angelena Form reviewed the echo images and the inferior wall moves well. He estimates her overall LVEF by echo to be 50%. The abnormality on LV function noted on LV gram may have been due to PVCs just before the imaging. Clinically she is doing well. She does not wish to pursue other testing at this time - cardiac MRI. I wondered if her presentation may be more Takotsubo like - her mother had just died prior to this event. She is doing well clinically.   2. PAF: She is now managed with rate control and continues on her Xarelto. This patients CHA2DS2-VASc Score and unadjusted Ischemic Stroke Rate (% per year) is equal to 7.2 % stroke rate/year from a score of 5 (Age, Female, HTN, CVA(2)). She is in AF today by exam - out of her metoprolol - dose given here to her today. Refilled Metoprolol as well.   3. HLD: intolerant to statins. Continue Fenofibrate.  4. HTN:BP controlled on her current regimen.   5. Chronic anticoagulation - no problems noted.   6. Prior stroke - minimal residual deficit.    Current medicines are reviewed with the patient today.  The patient does not have concerns regarding medicines other than what has been noted above.  The following changes have been made:  See  above.  Labs/ tests ordered today include:   No orders of the defined types were placed in this encounter.    Disposition:   FU with me  in 6 months or Dr. Rayann Heman.    Patient is agreeable to this plan and will call if any problems develop in the interim.   SignedTruitt Merle, NP  03/18/2017 7:54 AM  [facility] [address] [place], [place]  [address] Phone: [phone] Fax: [phone]

## 2017-04-13 ENCOUNTER — Ambulatory Visit (INDEPENDENT_AMBULATORY_CARE_PROVIDER_SITE_OTHER): Payer: Medicare Other | Admitting: Nurse Practitioner

## 2017-04-13 ENCOUNTER — Encounter: Payer: Self-pay | Admitting: Nurse Practitioner

## 2017-04-13 VITALS — BP 110/90 | HR 79 | Ht 66.0 in | Wt 175.1 lb

## 2017-04-13 DIAGNOSIS — I482 Chronic atrial fibrillation, unspecified: Secondary | ICD-10-CM

## 2017-04-13 DIAGNOSIS — Z7901 Long term (current) use of anticoagulants: Secondary | ICD-10-CM | POA: Diagnosis not present

## 2017-04-13 DIAGNOSIS — I259 Chronic ischemic heart disease, unspecified: Secondary | ICD-10-CM

## 2017-04-13 LAB — HEPATIC FUNCTION PANEL
ALT: 12 IU/L (ref 0–32)
AST: 19 IU/L (ref 0–40)
Albumin: 4.6 g/dL (ref 3.5–4.8)
Alkaline Phosphatase: 42 IU/L (ref 39–117)
Bilirubin Total: 0.6 mg/dL (ref 0.0–1.2)
Bilirubin, Direct: 0.13 mg/dL (ref 0.00–0.40)
Total Protein: 7.2 g/dL (ref 6.0–8.5)

## 2017-04-13 LAB — BASIC METABOLIC PANEL
BUN/Creatinine Ratio: 16 (ref 12–28)
BUN: 15 mg/dL (ref 8–27)
CO2: 25 mmol/L (ref 20–29)
Calcium: 9.4 mg/dL (ref 8.7–10.3)
Chloride: 104 mmol/L (ref 96–106)
Creatinine, Ser: 0.93 mg/dL (ref 0.57–1.00)
GFR calc Af Amer: 71 mL/min/{1.73_m2} (ref >59)
GFR calc non Af Amer: 62 mL/min/{1.73_m2} (ref >59)
Glucose: 104 mg/dL — ABNORMAL HIGH (ref 65–99)
Potassium: 4.7 mmol/L (ref 3.5–5.2)
Sodium: 142 mmol/L (ref 134–144)

## 2017-04-13 LAB — CBC
Hematocrit: 39.3 % (ref 34.0–46.6)
Hemoglobin: 13.9 g/dL (ref 11.1–15.9)
MCH: 31.9 pg (ref 26.6–33.0)
MCHC: 35.4 g/dL (ref 31.5–35.7)
MCV: 90 fL (ref 79–97)
Platelets: 396 10*3/uL — ABNORMAL HIGH (ref 150–379)
RBC: 4.36 x10E6/uL (ref 3.77–5.28)
RDW: 14.1 % (ref 12.3–15.4)
WBC: 5 10*3/uL (ref 3.4–10.8)

## 2017-04-13 LAB — LIPID PANEL
Chol/HDL Ratio: 4.8 ratio — ABNORMAL HIGH (ref 0.0–4.4)
Cholesterol, Total: 221 mg/dL — ABNORMAL HIGH (ref 100–199)
HDL: 46 mg/dL (ref >39)
LDL Calculated: 160 mg/dL — ABNORMAL HIGH (ref 0–99)
Triglycerides: 73 mg/dL (ref 0–149)
VLDL Cholesterol Cal: 15 mg/dL (ref 5–40)

## 2017-04-13 NOTE — Progress Notes (Signed)
CARDIOLOGY OFFICE NOTE  Date:  04/13/2017    Alexandra Pierce Date of Birth: [birth year] Medical Record #[id number]  PCP:  Antony Blackbird, MD  Cardiologist:  Servando Snare Allred    Chief Complaint  Patient presents with  . Atrial Fibrillation    Follow up visit - seen for Dr. Rayann Heman    History of Present Illness: Alexandra Pierce is a 71 y.o. female who presents today for a 6 month check. She is seen for Dr. Rayann Heman - former patient of Dr.Tennant's.   She has PAF, remote stroke, and on chronic anticoagulation - now on Xarelto due to labile readings with her coumadin. Other issues include HLD, HTN and obesity. She has had statin intolerance. Prior cath in 2001 with mild CAD and EF of 45 to 50%.  Hospitalized back in March of 2015 with recurrent stroke - multiple infarcts on MRI. Had missed several doses of her Xarelto. Was in atrial fib on arrival to the ER at that time. Understood the need to NOT miss her Xarelto. She had been traveling and really got out of her routine when that happened.   I have had Dr. Rayann Heman review her medicines - he felt her current regimen was ok and that if she had worsening symptoms of afib then we could consider another antiarrhythmic drug.   Discharged from [facility] on June 1 of 2017 - had chest pain - was in atrial fib - she was cathed - EF had dropped but was normal by echo. Mild to moderate non obstructive CAD noted on cath with medical management recommended. Flecainide was stopped, metoprolol added and she is now managed with rate control and continued anticoagulation.   I saw her for her post hospital visit - she was doing well. Last seen by me back in September of 2017 - she had ran out of her Metoprolol and could not get refilled so she took a few doses of Flecainide to "calm down" her palpitations. Otherwise was doing ok.   Comes back today - here alone. Has had lots of issues going on. Was to have started ACE - but did not - went to the pharmacy and  was told "that this was the same as her metoprolol". She is clearly not taking. She has lost her PCP - practice has closed. Needs referral to someone else.  Husband with leukemia - was hospitalized and now at rehab. She continues to work - it is her "outlet". She feels ok. Has lost weight. Some skin issues - feels like it is stress related - does not like taking Benadryl due to sedation. No palpitations. She is taking her Xarelto. No chest pain.   Past Medical History:  Diagnosis Date  . Atrial fibrillation ([place])   . Chronic anticoagulation   . Chronic lower back pain   . Hypercholesteremia   . Hypertension    mild  . Obesity   . Paroxysmal atrial fibrillation (HCC)    managed with anticoagulation and rate control.   . Stroke [facility]) 2001 & 2015   "right side gets weak sometimes if I'm only tired" (03/16/2016)    Past Surgical History:  Procedure Laterality Date  . CARDIAC CATHETERIZATION  03/31/2000   EF 49%/LAD lg vessel which coursed to the apex & gave rise to 2 diagonal branches/ LAD noted to have 30% ostial lesion & tapers to a sm vessel toward the apex but no high grade lesion/1st diagonal med sized vessel & 2nd diagonal sm vessel  with no significant disease/ lt ventriculogram reveals mild global hypokinesis w/ an estimated EF of 45-50%  . CARDIAC CATHETERIZATION N/A 03/17/2016   Procedure: Left Heart Cath and Coronary Angiography;  Surgeon: Wellington Hampshire, MD;  Location: [facility];  Service: Cardiovascular;  Laterality: N/A;     Medications: Current Meds  Medication Sig  . acetaminophen (TYLENOL) 500 MG tablet Take 1,000 mg by mouth as needed for mild pain or headache.   . cholecalciferol (VITAMIN D) 1000 UNITS tablet Take 1,000 Units by mouth daily.  . fenofibrate 160 MG tablet TAKE ONE TABLET BY MOUTH ONCE DAILY  . metoprolol tartrate (LOPRESSOR) 25 MG tablet Take 1 tablet (25 mg total) by mouth 2 (two) times daily.  Alveda Reasons 20 MG TABS tablet TAKE ONE TABLET BY  MOUTH ONE TIME DAILY   Current Facility-Administered Medications for the 04/13/17 encounter (Office Visit) with Burtis Junes, NP  Medication  . 0.9 %  sodium chloride infusion  . 0.9 %  sodium chloride infusion  . metoprolol tartrate (LOPRESSOR) tablet 25 mg     Allergies: Allergies  Allergen Reactions  . Sulfonamide Derivatives Anaphylaxis  . Lipitor [Atorvastatin] Other (See Comments)    Caused bladder infection  . Pravachol [Pravastatin Sodium] Other (See Comments)    Myalgias  . Statins Other (See Comments)    Myalgia    Social History: The patient  reports that she quit smoking about 38 years ago. Her smoking use included Cigarettes. She has a 1.80 pack-year smoking history. She has never used smokeless tobacco. She reports that she drinks about 8.4 oz of alcohol per week . She reports that she does not use drugs.   Family History: The patient's family history includes Asthma in her father; Diabetes in her father; Heart disease in her brother, father, and mother; Hyperlipidemia in her brother, brother, and sister.   Review of Systems: Please see the history of present illness.   Otherwise, the review of systems is positive for none.   All other systems are reviewed and negative.   Physical Exam: VS:  BP 110/90 (BP Location: Left Arm, Patient Position: Sitting, Cuff Size: Large)   Pulse 79   Ht 5\' 6"  (1.676 m)   Wt 175 lb 1.9 oz (79.4 kg)   BMI 28.27 kg/m  .  BMI Body mass index is 28.27 kg/m.  Wt Readings from Last 3 Encounters:  04/13/17 175 lb 1.9 oz (79.4 kg)  10/29/16 181 lb (82.1 kg)  09/21/16 181 lb 2 oz (82.2 kg)    General: Pleasant. Well developed, well nourished and in no acute distress.   HEENT: Normal.  Neck: Supple, no JVD, carotid bruits, or masses noted.  Cardiac: Irregular irregular rhythm. Rate is fine. No edema.  Respiratory:  Lungs are clear to auscultation bilaterally with normal work of breathing.  GI: Soft and nontender.  MS: No  deformity or atrophy. Gait and ROM intact.  Skin: Warm and dry. Color is normal.  Neuro:  Strength and sensation are intact and no gross focal deficits noted.  Psych: Alert, appropriate and with normal affect.   LABORATORY DATA:  EKG:  EKG is ordered today. This demonstrates atrial fib with a controlled VR of 79.  Lab Results  Component Value Date   WBC 4.6 03/18/2016   HGB 12.0 03/18/2016   HCT 35.1 (L) 03/18/2016   PLT 350 03/18/2016   GLUCOSE 93 03/18/2016   CHOL 208 (H) 03/17/2016   TRIG 94 03/17/2016  HDL 39 (L) 03/17/2016   LDLDIRECT 163.2 01/19/2013   LDLCALC 150 (H) 03/17/2016   ALT 13 09/26/2015   AST 16 09/26/2015   NA 141 03/18/2016   K 4.2 03/18/2016   CL 111 03/18/2016   CREATININE 0.78 03/18/2016   BUN 12 03/18/2016   CO2 24 03/18/2016   INR 1.40 03/17/2016   HGBA1C 5.6 01/11/2014     BNP (last 3 results) No results for input(s): BNP in the last 8760 hours.  ProBNP (last 3 results) No results for input(s): PROBNP in the last 8760 hours.   Other Studies Reviewed Today:  Cardiac Cath Conclusion from 02/2016    Mid RCA lesion, 20% stenosed.  Mid Cx lesion, 20% stenosed.  Dist LAD lesion, 50% stenosed.  Mid LAD lesion, 20% stenosed.  Ost LAD lesion, 30% stenosed.  There is moderate to severe left ventricular systolic dysfunction.  1. Mild to moderate nonobstructive coronary artery disease. 2. Moderately to severely reduced LV systolic function with an ejection fraction of 30-35% with moderate mitral regurgitation. 3. Normal LVEDP.   Recommendations: I don't see a culprit for unstable angina. The patient clearly has worsening LV systolic dysfunction. I wonder if this is related to tachycardia-induced cardiomyopathy given that the patient has been in atrial fibrillation over the last 6-12 months. I discontinued flecainide and added metoprolol which can be switched to long-acting before discharge. I resumed ramipril. Xarelto can be resumed  tomorrow.    Echo Study Conclusions from 02/2016  - Left ventricle: The cavity size was normal. Systolic function was  normal. The estimated ejection fraction was 55%. Wall motion was  normal; there were no regional wall motion abnormalities. Normal  sinus rhythm was absent. The study is not technically sufficient  to allow evaluation of LV diastolic function. - Aortic valve: Moderately calcified annulus. Trileaflet; mildly  thickened, mildly calcified leaflets. - Mitral valve: There was mild regurgitation. - Left atrium: The atrium was moderately dilated. - Pulmonary arteries: PA peak pressure: 32 mm Hg (S).   Carotid Doppler Summary 2015: Bilateral: intimal wall thickening CCA. Mild soft plaque origin ICA. 1-39% ICA stenosis. Vertebral artery flow is antegrade.  Other specific details can be found in the table(s) above. Prepared and Electronically Authenticated by  Antony Contras 2015-03-30T10:55:00.567   Assessment / Plan:  1. CAD with angina/Chest pain: Cardiac cath 03/17/16 with mild to moderate non-obstructive CAD. Medical management of CAD is recommended. LVEF noted to be reduced on LV gram but echo the same day shows normal LV function. Dr. Angelena Form reviewed the echo images and the inferior wall moves well. He estimates her overall LVEF by echo to be 50%. The abnormality on LV function noted on LV gram may have been due to PVCs just before the imaging. Clinically she is doing well. She has not wished to pursue other testing - cardiac MRI. I wondered if her presentation may be more Takotsubo like - her mother had just died prior to this event. She is currently doing well clinically. No changes made today.   2. PAF: She is now managed with rate control and continues on her Xarelto. This patients CHA2DS2-VASc Score and unadjusted Ischemic Stroke Rate (% per year) is equal to 7.2 % stroke rate/year from a score of 5 (Age, Female, HTN, CVA(2)). She is in AF today by  exam and EKG today. Rate is well controlled. Continue with anticoagulation  3. HLD: intolerant to statins. Continue Fenofibrate. Labs today.   4. HTN:BP ok. She has lost weight -  would hold on restarting the ACE for now.   5. Chronic anticoagulation - no problems noted. Lab today.   6. Prior stroke - minimal residual deficit.  7. Situational stress - she has a lot on her plate at this time - prognosis for her husband does not sound good. We talked about this at length today.     Current medicines are reviewed with the patient today.  The patient does not have concerns regarding medicines other than what has been noted above.  The following changes have been made:  See above.  Labs/ tests ordered today include:    Orders Placed This Encounter  Procedures  . Basic metabolic panel  . CBC  . Hepatic function panel  . Lipid panel  . EKG 12-Lead     Disposition:   FU with me in 6 months.   Patient is agreeable to this plan and will call if any problems develop in the interim.   SignedTruitt Merle, NP  04/13/2017 9:56 AM  [facility] [address] [place], [place]  [address] Phone: [phone] Fax: [phone]

## 2017-04-13 NOTE — Patient Instructions (Addendum)
We will be checking the following labs today - BMET, CBC, HPF and lipids   Medication Instructions:    Continue with your current medicines.     Testing/Procedures To Be Arranged:  N/A  Follow-Up:   See me in 6 months    Other Special Instructions:   N/A    If you need a refill on your cardiac medications before your next appointment, please call your pharmacy.   Call the [facility] office at [phone] if you have any questions, problems or concerns.      

## 2017-06-16 ENCOUNTER — Other Ambulatory Visit: Payer: Self-pay | Admitting: Internal Medicine

## 2017-06-16 NOTE — Telephone Encounter (Signed)
Request received for Xarelto 20mg ; pt is 71 yrs old, wt-79.4kg, Crea-0.93 on 04/13/17, last seen by Cecille Rubin on 04/13/17, CrCl-68.54. Will send in refill to requested pharmacy.

## 2017-09-05 ENCOUNTER — Other Ambulatory Visit: Payer: Self-pay | Admitting: Nurse Practitioner

## 2017-09-27 ENCOUNTER — Other Ambulatory Visit: Payer: Self-pay | Admitting: Nurse Practitioner

## 2017-09-30 ENCOUNTER — Other Ambulatory Visit: Payer: Self-pay | Admitting: Nurse Practitioner

## 2017-10-03 ENCOUNTER — Ambulatory Visit: Payer: Medicare Other | Admitting: Nurse Practitioner

## 2017-10-03 ENCOUNTER — Encounter: Payer: Self-pay | Admitting: Nurse Practitioner

## 2017-10-03 VITALS — BP 130/70 | HR 73 | Ht 66.0 in | Wt 182.0 lb

## 2017-10-03 DIAGNOSIS — I6523 Occlusion and stenosis of bilateral carotid arteries: Secondary | ICD-10-CM | POA: Diagnosis not present

## 2017-10-03 DIAGNOSIS — I482 Chronic atrial fibrillation, unspecified: Secondary | ICD-10-CM

## 2017-10-03 DIAGNOSIS — E7849 Other hyperlipidemia: Secondary | ICD-10-CM | POA: Diagnosis not present

## 2017-10-03 DIAGNOSIS — Z7901 Long term (current) use of anticoagulants: Secondary | ICD-10-CM | POA: Diagnosis not present

## 2017-10-03 DIAGNOSIS — I259 Chronic ischemic heart disease, unspecified: Secondary | ICD-10-CM

## 2017-10-03 MED ORDER — METOPROLOL TARTRATE 25 MG PO TABS: 25.0000 mg | ORAL_TABLET | Freq: Two times a day (BID) | ORAL | 3 refills | Status: DC

## 2017-10-03 MED ORDER — RIVAROXABAN 20 MG PO TABS: 20.0000 mg | ORAL_TABLET | Freq: Every day | ORAL | 3 refills | Status: DC

## 2017-10-03 MED ORDER — FENOFIBRATE 160 MG PO TABS: 160.0000 mg | ORAL_TABLET | Freq: Every day | ORAL | 3 refills | Status: DC

## 2017-10-03 NOTE — Patient Instructions (Addendum)
We will be checking the following labs today - BMET, CBC, HPF and Lipids   Medication Instructions:    Continue with your current medicines.   I sent in your medicine refills today    Testing/Procedures To Be Arranged:  Carotid doppler study  Follow-Up:   See me in about 6 months    Other Special Instructions:   N/A    If you need a refill on your cardiac medications before your next appointment, please call your pharmacy.   Call the [place] office at [phone] if you have any questions, problems or concerns.

## 2017-10-03 NOTE — Progress Notes (Signed)
CARDIOLOGY OFFICE NOTE  Date:  10/03/2017    Alexandra Pierce Date of Birth: 04-Nov-1945 Medical Record #[id number]  PCP:  Antony Blackbird, MD (Inactive)  Cardiologist:  Servando Snare & Allred    Chief Complaint  Patient presents with  . Atrial Fibrillation    6 month check - seen for Dr. Rayann Heman    History of Present Illness: Alexandra Pierce is a 71 y.o. female who presents today for a 6 month check. She is seen for Dr. Rayann Heman - former patient of Dr.Tennant's.   She has PAF, remote stroke, and on chronic anticoagulation - now on Xarelto due to labile readings with her coumadin. Other issues include HLD, HTN and obesity. She has had statin intolerance. Prior cath in 2001 with mild CAD and EF of 45 to 50%.  Hospitalized back in March of 2015 with recurrent stroke - multiple infarcts on MRI. Had missed several doses of her Xarelto. Was in atrial fib on arrival to the ER at that time. Understood the need to NOT miss her Xarelto. She had been traveling and really got out of her routine when that happened.   I have had Dr. Rayann Heman review her medicines - he felt her current regimen was ok and that if she had worsening symptoms of afib then we could consider another antiarrhythmic drug.   Discharged from [facility] on June 1 of 2017 - had chest pain - was in atrial fib - she was cathed - EF haddropped but was normal by echo. Mild to moderate non obstructive CAD noted on cath with medical management recommended. Flecainide was stopped, metoprolol added and she is now managed with rate control and continued anticoagulation.   I saw her for her post hospital visit - she was doing well. Last seen by me back in June - she was doing well but her medicines were mixed up again. She lost her PCP. Husband diagnosed with leukemia. Had lost weight due to all the stress. Cardiac status felt to be ok.    Comes back today - here alone. Her husband died back in 2023/06/25. Brother also died during coronary stent placement  up in [place] last month. She is pretty overwhelmed and "trying to work thru it". Not looking forward to the holidays. No chest pain. Has stopped walking. Sleeping too much. She notes some shortness of breath - mainly with going up steps - attributed to not walking. No real palpitations. Tolerating her medicines. Still needs to get to PCP - she still has the name I gave her last time.   Past Medical History:  Diagnosis Date  . Atrial fibrillation ([place])   . Chronic anticoagulation   . Chronic lower back pain   . Hypercholesteremia   . Hypertension    mild  . Obesity   . Paroxysmal atrial fibrillation (HCC)    managed with anticoagulation and rate control.   . Stroke [facility]) 2001 & 2015   "right side gets weak sometimes if I'm only tired" (03/16/2016)    Past Surgical History:  Procedure Laterality Date  . CARDIAC CATHETERIZATION  03/31/2000   EF 49%/LAD lg vessel which coursed to the apex & gave rise to 2 diagonal branches/ LAD noted to have 30% ostial lesion & tapers to a sm vessel toward the apex but no high grade lesion/1st diagonal med sized vessel & 2nd diagonal sm vessel with no significant disease/ lt ventriculogram reveals mild global hypokinesis w/ an estimated EF of 45-50%  . CARDIAC CATHETERIZATION  N/A 03/17/2016   Procedure: Left Heart Cath and Coronary Angiography;  Surgeon: Wellington Hampshire, MD;  Location: [place] [facility];  Service: Cardiovascular;  Laterality: N/A;     Medications: Current Meds  Medication Sig  . acetaminophen (TYLENOL) 500 MG tablet Take 1,000 mg by mouth as needed for mild pain or headache.   . cholecalciferol (VITAMIN D) 1000 UNITS tablet Take 1,000 Units by mouth daily.  . fenofibrate 160 MG tablet Take 1 tablet (160 mg total) by mouth daily.  . metoprolol tartrate (LOPRESSOR) 25 MG tablet Take 1 tablet (25 mg total) by mouth 2 (two) times daily.  . rivaroxaban (XARELTO) 20 MG TABS tablet Take 1 tablet (20 mg total) by mouth daily.  .  [DISCONTINUED] fenofibrate 160 MG tablet TAKE ONE TABLET BY MOUTH ONCE DAILY  . [DISCONTINUED] metoprolol tartrate (LOPRESSOR) 25 MG tablet Take 1 tablet (25 mg total) by mouth 2 (two) times daily.  . [DISCONTINUED] XARELTO 20 MG TABS tablet TAKE ONE TABLET BY MOUTH ONE TIME DAILY   Current Facility-Administered Medications for the 10/03/17 encounter (Office Visit) with Burtis Junes, NP  Medication  . 0.9 %  sodium chloride infusion  . 0.9 %  sodium chloride infusion  . metoprolol tartrate (LOPRESSOR) tablet 25 mg     Allergies: Allergies  Allergen Reactions  . Sulfonamide Derivatives Anaphylaxis  . Lipitor [Atorvastatin] Other (See Comments)    Caused bladder infection  . Pravachol [Pravastatin Sodium] Other (See Comments)    Myalgias  . Statins Other (See Comments)    Myalgia    Social History: The patient  reports that she quit smoking about 38 years ago. Her smoking use included cigarettes. She has a 1.80 pack-year smoking history. she has never used smokeless tobacco. She reports that she drinks about 8.4 oz of alcohol per week. She reports that she does not use drugs.   Family History: The patient's family history includes Asthma in her father; Diabetes in her father; Heart disease in her brother, father, and mother; Hyperlipidemia in her brother, brother, and sister.   Review of Systems: Please see the history of present illness.   Otherwise, the review of systems is positive for none.   All other systems are reviewed and negative.   Physical Exam: VS:  BP 130/70 (BP Location: Left Arm, Patient Position: Sitting, Cuff Size: Normal)   Pulse 73   Ht 5\' 6"  (1.676 m)   Wt 182 lb (82.6 kg)   SpO2 100% Comment: at rest  BMI 29.38 kg/m  .  BMI Body mass index is 29.38 kg/m.  Wt Readings from Last 3 Encounters:  10/03/17 182 lb (82.6 kg)  04/13/17 175 lb 1.9 oz (79.4 kg)  10/29/16 181 lb (82.1 kg)    General: Pleasant. Well developed, well nourished and in no acute  distress. Pretty tearful today.  HEENT: Normal.  Neck: Supple, no JVD, carotid bruits, or masses noted.  Cardiac: Irregular irregular rhythm. Her rate is ok. No murmurs, rubs, or gallops. No edema.  Respiratory:  Lungs are clear to auscultation bilaterally with normal work of breathing.  GI: Soft and nontender.  MS: No deformity or atrophy. Gait and ROM intact.  Skin: Warm and dry. Color is normal.  Neuro:  Strength and sensation are intact and no gross focal deficits noted.  Psych: Alert, appropriate and with normal affect.   LABORATORY DATA:  EKG:  EKG is not ordered today.   Lab Results  Component Value Date   WBC  5.0 04/13/2017   HGB 13.9 04/13/2017   HCT 39.3 04/13/2017   PLT 396 (H) 04/13/2017   GLUCOSE 104 (H) 04/13/2017   CHOL 221 (H) 04/13/2017   TRIG 73 04/13/2017   HDL 46 04/13/2017   LDLDIRECT 163.2 01/19/2013   LDLCALC 160 (H) 04/13/2017   ALT 12 04/13/2017   AST 19 04/13/2017   NA 142 04/13/2017   K 4.7 04/13/2017   CL 104 04/13/2017   CREATININE 0.93 04/13/2017   BUN 15 04/13/2017   CO2 25 04/13/2017   INR 1.40 03/17/2016   HGBA1C 5.6 01/11/2014     BNP (last 3 results) No results for input(s): BNP in the last 8760 hours.  ProBNP (last 3 results) No results for input(s): PROBNP in the last 8760 hours.   Other Studies Reviewed Today:  Cardiac Cath Conclusion from 02/2016    Mid RCA lesion, 20% stenosed.  Mid Cx lesion, 20% stenosed.  Dist LAD lesion, 50% stenosed.  Mid LAD lesion, 20% stenosed.  Ost LAD lesion, 30% stenosed.  There is moderate to severe left ventricular systolic dysfunction.  1. Mild to moderate nonobstructive coronary artery disease. 2. Moderately to severely reduced LV systolic function with an ejection fraction of 30-35% with moderate mitral regurgitation. 3. Normal LVEDP.   Recommendations: I don't see a culprit for unstable angina. The patient clearly has worsening LV systolic dysfunction. I wonder if this is  related to tachycardia-induced cardiomyopathy given that the patient has been in atrial fibrillation over the last 6-12 months. I discontinued flecainide and added metoprolol which can be switched to long-acting before discharge. I resumed ramipril. Xarelto can be resumed tomorrow.    Echo Study Conclusions from 02/2016  - Left ventricle: The cavity size was normal. Systolic function was  normal. The estimated ejection fraction was 55%. Wall motion was  normal; there were no regional wall motion abnormalities. Normal  sinus rhythm was absent. The study is not technically sufficient  to allow evaluation of LV diastolic function. - Aortic valve: Moderately calcified annulus. Trileaflet; mildly  thickened, mildly calcified leaflets. - Mitral valve: There was mild regurgitation. - Left atrium: The atrium was moderately dilated. - Pulmonary arteries: PA peak pressure: 32 mm Hg (S).   Carotid Doppler Summary 2015: Bilateral: intimal wall thickening CCA. Mild soft plaque origin ICA. 1-39% ICA stenosis. Vertebral artery flow is antegrade.  Other specific details can be found in the table(s) above. Prepared and Electronically Authenticated by  Antony Contras 2015-03-30T10:55:00.567   Assessment / Plan:  1. CAD with angina/Chest pain:Cardiac cath 03/17/16 with mild to moderate non-obstructive CAD. Medical management of CAD is recommended. LVEF noted to be reduced on LV gram but echo the same day shows normal LV function. Dr. Angelena Form reviewed the echo images and the inferior wall moves well. He estimates her overall LVEF by echo to be 50%. The abnormality on LV function noted on LV gram may have been due to PVCs just before the imaging. Clinically she is doing well. She did not wish to pursue other testing - cardiac MRI. I wonderedif her presentation may be more Takotsubo like - her mother had just died prior to this event.   Currently doing ok - lots of grief and stress - I  worry about her but for now, she seems to be stable. No changes made today.    2. Persistent AF - managed with rate control and anticoagulation. Lab today.   3. HLD: intolerant to statins. Continue Fenofibrate. Labs today.  4. HTN:BP ok. I have left her on her current regimen for now.    5. Chronic anticoagulation - no problems noted. Lab today.   6. Prior stroke- minimal residual deficit.  7. Situational stress/grief - most of today's visit centered around this.   8. Carotid disease - needs updating.   Current medicines are reviewed with the patient today.  The patient does not have concerns regarding medicines other than what has been noted above.  The following changes have been made:  See above.  Labs/ tests ordered today include:    Orders Placed This Encounter  Procedures  . Basic metabolic panel  . CBC  . Hepatic function panel  . Lipid panel     Disposition:   FU with me in 6 months.   Patient is agreeable to this plan and will call if any problems develop in the interim.   SignedTruitt Merle, NP  10/03/2017 9:59 AM  [facility] [address] [place], [place]  [address] Phone: [phone] Fax: [phone]

## 2017-10-04 LAB — LIPID PANEL
Chol/HDL Ratio: 4.8 ratio — ABNORMAL HIGH (ref 0.0–4.4)
Cholesterol, Total: 234 mg/dL — ABNORMAL HIGH (ref 100–199)
HDL: 49 mg/dL (ref >39)
LDL Calculated: 164 mg/dL — ABNORMAL HIGH (ref 0–99)
Triglycerides: 103 mg/dL (ref 0–149)
VLDL Cholesterol Cal: 21 mg/dL (ref 5–40)

## 2017-10-04 LAB — CBC
Hematocrit: 38.1 % (ref 34.0–46.6)
Hemoglobin: 12.3 g/dL (ref 11.1–15.9)
MCH: 31.6 pg (ref 26.6–33.0)
MCHC: 32.3 g/dL (ref 31.5–35.7)
MCV: 98 fL — ABNORMAL HIGH (ref 79–97)
Platelets: 410 10*3/uL — ABNORMAL HIGH (ref 150–379)
RBC: 3.89 x10E6/uL (ref 3.77–5.28)
RDW: 16.7 % — ABNORMAL HIGH (ref 12.3–15.4)
WBC: 5.9 10*3/uL (ref 3.4–10.8)

## 2017-10-04 LAB — BASIC METABOLIC PANEL
BUN/Creatinine Ratio: 22 (ref 12–28)
BUN: 18 mg/dL (ref 8–27)
CO2: 24 mmol/L (ref 20–29)
Calcium: 9.3 mg/dL (ref 8.7–10.3)
Chloride: 104 mmol/L (ref 96–106)
Creatinine, Ser: 0.81 mg/dL (ref 0.57–1.00)
GFR calc Af Amer: 84 mL/min/{1.73_m2} (ref >59)
GFR calc non Af Amer: 73 mL/min/{1.73_m2} (ref >59)
Glucose: 107 mg/dL — ABNORMAL HIGH (ref 65–99)
Potassium: 4.7 mmol/L (ref 3.5–5.2)
Sodium: 142 mmol/L (ref 134–144)

## 2017-10-04 LAB — HEPATIC FUNCTION PANEL
ALT: 13 IU/L (ref 0–32)
AST: 19 IU/L (ref 0–40)
Albumin: 4.3 g/dL (ref 3.5–4.8)
Alkaline Phosphatase: 58 IU/L (ref 39–117)
Bilirubin Total: 0.9 mg/dL (ref 0.0–1.2)
Bilirubin, Direct: 0.18 mg/dL (ref 0.00–0.40)
Total Protein: 6.8 g/dL (ref 6.0–8.5)

## 2017-10-21 ENCOUNTER — Ambulatory Visit (HOSPITAL_COMMUNITY)
Admission: RE | Admit: 2017-10-21 | Discharge: 2017-10-21 | Disposition: A | Discharge status: Home / Self Care | Payer: Medicare Other | Source: Ambulatory Visit | Attending: Cardiovascular Disease | Admitting: Cardiovascular Disease

## 2017-10-21 DIAGNOSIS — I6523 Occlusion and stenosis of bilateral carotid arteries: Secondary | ICD-10-CM | POA: Insufficient documentation

## 2018-03-27 ENCOUNTER — Encounter: Payer: Self-pay | Admitting: Nurse Practitioner

## 2018-03-27 ENCOUNTER — Encounter (INDEPENDENT_AMBULATORY_CARE_PROVIDER_SITE_OTHER): Payer: Self-pay

## 2018-03-27 ENCOUNTER — Ambulatory Visit: Payer: Medicare Other | Admitting: Nurse Practitioner

## 2018-03-27 VITALS — BP 114/70 | HR 78 | Ht 66.0 in | Wt 180.0 lb

## 2018-03-27 DIAGNOSIS — I482 Chronic atrial fibrillation, unspecified: Secondary | ICD-10-CM

## 2018-03-27 DIAGNOSIS — Z7901 Long term (current) use of anticoagulants: Secondary | ICD-10-CM

## 2018-03-27 DIAGNOSIS — I259 Chronic ischemic heart disease, unspecified: Secondary | ICD-10-CM

## 2018-03-27 DIAGNOSIS — E7849 Other hyperlipidemia: Secondary | ICD-10-CM | POA: Diagnosis not present

## 2018-03-27 MED ORDER — RIVAROXABAN 20 MG PO TABS: 20.0000 mg | ORAL_TABLET | Freq: Every day | ORAL | 11 refills | Status: DC

## 2018-03-27 NOTE — Progress Notes (Signed)
CARDIOLOGY OFFICE NOTE   Date:  03/27/2018    Alexandra Pierce Date of Birth: September 06, 1946 Medical Record #[id number]  PCP:  Chesley Noon, MD  Cardiologist:  Servando Snare Allred  Chief Complaint  Patient presents with  . Atrial Fibrillation  . Coronary Artery Disease  . Hypertension  . Hyperlipidemia    6 month check - seen for Dr. Rayann Heman    History of Present Illness: Alexandra Pierce is a 72 y.o. female who presents today for a 6 month check. She is seen for Dr. Rayann Heman - former patient of Dr.Tennant's.   She has PAF, remote stroke, and on chronic anticoagulation - now on Xarelto due to labile readings with her coumadin. Other issues include HLD, HTN and obesity. She has had statin intolerance. Prior cath in 2001 with mild CAD and EF of 45 to 50%.  Hospitalized back in March of 2015 with recurrent stroke - multiple infarcts on MRI. Had missed several doses of her Xarelto. Was in atrial fib on arrival to the ER at that time. Understood the need to NOT miss her Xarelto. She had been traveling and really got out of her routine when that happened.   Discharged from [facility] on June 1 of 2017 - had chest pain - was in atrial fib - she was cathed - EF haddropped but was normal by echo. Mild to moderate non obstructive CAD noted on cath with medical management recommended. Flecainide was stopped, metoprolol added and she is now managed with rate control and continued anticoagulation.   I have followed her since - she has done ok - medicines have gotten mixed up from time to time. At last visit in 11-05-2023 - her husband died back in Jul 05, 2017 and brother had died in Oct 06, 2023. Had lost her PCP. She was pretty overwhelmed. Cardiac status ok.   Comes in today. Here alone. She notes that she is not doing well. She is having progressive chest tightness and shortness of breath. This is all exertional related. She is to the point that she cannot go and walk - says "it won't let me". Feels  symptoms with inclines as well. It all resolves with rest. She feels like her heart rate speeds up and her "atrial fib acts up" in the setting of chest pressure/tightness. She cannot even bring her trash cans to the street anymore. She admits she is anxious. Not sleeping well. Some crying but not much. She is almost finished with taking care of her husband's affairs. Her brother died on the cath table last fall. She feels like something "is wrong".   Past Medical History:  Diagnosis Date  . Atrial fibrillation ([place])   . Chronic anticoagulation   . Chronic lower back pain   . Hypercholesteremia   . Hypertension    mild  . Obesity   . Paroxysmal atrial fibrillation (HCC)    managed with anticoagulation and rate control.   . Stroke [facility]) 2001 & 2015   "right side gets weak sometimes if I'm only tired" (03/16/2016)    Past Surgical History:  Procedure Laterality Date  . CARDIAC CATHETERIZATION  03/31/2000   EF 49%/LAD lg vessel which coursed to the apex & gave rise to 2 diagonal branches/ LAD noted to have 30% ostial lesion & tapers to a sm vessel toward the apex but no high grade lesion/1st diagonal med sized vessel & 2nd diagonal sm vessel with no significant disease/ lt ventriculogram reveals mild global hypokinesis w/  an estimated EF of 45-50%  . CARDIAC CATHETERIZATION N/A 03/17/2016   Procedure: Left Heart Cath and Coronary Angiography;  Surgeon: Wellington Hampshire, MD;  Location: [place] [facility];  Service: Cardiovascular;  Laterality: N/A;     Medications: Current Meds  Medication Sig  . acetaminophen (TYLENOL) 500 MG tablet Take 1,000 mg by mouth as needed for mild pain or headache.   . cholecalciferol (VITAMIN D) 1000 UNITS tablet Take 1,000 Units by mouth daily.  . fenofibrate 160 MG tablet Take 1 tablet (160 mg total) by mouth daily.  . metoprolol tartrate (LOPRESSOR) 25 MG tablet Take 1 tablet (25 mg total) by mouth 2 (two) times daily.  . [DISCONTINUED] rivaroxaban (XARELTO)  20 MG TABS tablet Take 1 tablet (20 mg total) by mouth daily.  . rivaroxaban (XARELTO) 20 MG TABS tablet Take 1 tablet (20 mg total) by mouth daily.   Current Facility-Administered Medications for the 03/27/18 encounter (Office Visit) with Burtis Junes, NP  Medication  . 0.9 %  sodium chloride infusion  . 0.9 %  sodium chloride infusion  . metoprolol tartrate (LOPRESSOR) tablet 25 mg     Allergies: Allergies  Allergen Reactions  . Sulfonamide Derivatives Anaphylaxis  . Lipitor [Atorvastatin] Other (See Comments)    Caused bladder infection  . Pravachol [Pravastatin Sodium] Other (See Comments)    Myalgias  . Statins Other (See Comments)    Myalgia    Social History: The patient  reports that she quit smoking about 39 years ago. Her smoking use included cigarettes. She has a 1.80 pack-year smoking history. She has never used smokeless tobacco. She reports that she drinks about 8.4 oz of alcohol per week. She reports that she does not use drugs.   Family History: The patient's family history includes Asthma in her father; Diabetes in her father; Heart disease in her brother, father, and mother; Hyperlipidemia in her brother, brother, and sister.   Review of Systems: Please see the history of present illness.   Otherwise, the review of systems is positive for none.   All other systems are reviewed and negative.   Physical Exam: VS:  BP 114/70   Pulse 78   Ht 5\' 6"  (1.676 m)   Wt 180 lb (81.6 kg)   SpO2 93%   BMI 29.05 kg/m  .  BMI Body mass index is 29.05 kg/m.  Wt Readings from Last 3 Encounters:  03/27/18 180 lb (81.6 kg)  10/03/17 182 lb (82.6 kg)  04/13/17 175 lb 1.9 oz (79.4 kg)    General: Pleasant. She does not look her stated age. She is alert and in no acute distress.   HEENT: Normal.  Neck: Supple, no JVD, carotid bruits, or masses noted.  Cardiac: Irregular irregular rhythm. Her rate is ok. No real murmur that I appreciate.  No edema.  Respiratory:   Lungs are clear to auscultation bilaterally with normal work of breathing.  GI: Soft and nontender.  MS: No deformity or atrophy. Gait and ROM intact.  Skin: Warm and dry. Color is normal.  Neuro:  Strength and sensation are intact and no gross focal deficits noted.  Psych: Alert, appropriate and with normal affect.   LABORATORY DATA:  EKG:  EKG is ordered today. This shows chronic AF with controlled VR. HR today is 78.   Lab Results  Component Value Date   WBC 5.9 10/03/2017   HGB 12.3 10/03/2017   HCT 38.1 10/03/2017   PLT 410 (H) 10/03/2017  GLUCOSE 107 (H) 10/03/2017   CHOL 234 (H) 10/03/2017   TRIG 103 10/03/2017   HDL 49 10/03/2017   LDLDIRECT 163.2 01/19/2013   LDLCALC 164 (H) 10/03/2017   ALT 13 10/03/2017   AST 19 10/03/2017   NA 142 10/03/2017   K 4.7 10/03/2017   CL 104 10/03/2017   CREATININE 0.81 10/03/2017   BUN 18 10/03/2017   CO2 24 10/03/2017   INR 1.40 03/17/2016   HGBA1C 5.6 01/11/2014     BNP (last 3 results) No results for input(s): BNP in the last 8760 hours.  ProBNP (last 3 results) No results for input(s): PROBNP in the last 8760 hours.   Other Studies Reviewed Today:   Cardiac Cath Conclusion from 02/2016    Mid RCA lesion, 20% stenosed.  Mid Cx lesion, 20% stenosed.  Dist LAD lesion, 50% stenosed.  Mid LAD lesion, 20% stenosed.  Ost LAD lesion, 30% stenosed.  There is moderate to severe left ventricular systolic dysfunction.  1. Mild to moderate nonobstructive coronary artery disease. 2. Moderately to severely reduced LV systolic function with an ejection fraction of 30-35% with moderate mitral regurgitation. 3. Normal LVEDP.   Recommendations: I don't see a culprit for unstable angina. The patient clearly has worsening LV systolic dysfunction. I wonder if this is related to tachycardia-induced cardiomyopathy given that the patient has been in atrial fibrillation over the last 6-12 months. I discontinued flecainide and  added metoprolol which can be switched to long-acting before discharge. I resumed ramipril. Xarelto can be resumed tomorrow.    Echo Study Conclusions from 02/2016  - Left ventricle: The cavity size was normal. Systolic function was  normal. The estimated ejection fraction was 55%. Wall motion was  normal; there were no regional wall motion abnormalities. Normal  sinus rhythm was absent. The study is not technically sufficient  to allow evaluation of LV diastolic function. - Aortic valve: Moderately calcified annulus. Trileaflet; mildly  thickened, mildly calcified leaflets. - Mitral valve: There was mild regurgitation. - Left atrium: The atrium was moderately dilated. - Pulmonary arteries: PA peak pressure: 32 mm Hg (S).   Carotid Doppler Summary 2015: Bilateral: intimal wall thickening CCA. Mild soft plaque origin ICA. 1-39% ICA stenosis. Vertebral artery flow is antegrade.  Other specific details can be found in the table(s) above. Prepared and Electronically Authenticated by  Antony Contras 2015-03-30T10:55:00.567   Assessment / Plan:  1. CAD with angina/Chest pain:Cardiac cath 03/17/16 with mild to moderate non-obstructive CAD. Medical management of CAD was recommended at that time. LVEF noted to be reduced on LV gram but echo the same day shoeds normal LV function. I had Dr. Angelena Form previously review the echo images and the inferior wall was noted to be moving well. He estimates her overall LVEF by echo to be about 50%. He speculated that the abnormality on LV function noted on LV gram may have been due to PVCs just before the imaging. She did notwishto pursue other testing - cardiac MRI.   Now with more chest pressure - with exertion - worrisome for progression of her CAD. Her lipids have never been ideally treated due to her statin intolerance. She is agreeable to proceeding with cardiac cath - discussed with pharmacy and due to prior history of stroke and  normal renal function - will hold her Xarelto for 24 hours only prior to cath.   The patient understands that risks include but are not limited to stroke (1 in 1000), death (1 in 92), kidney failure [  usually temporary] (1 in 500), bleeding (1 in 200), allergic reaction [possibly serious] (1 in 200), and agrees to proceed. This has been scheduled for this Friday with Dr. Martinique.   If this turns out ok, may need to consider PFTs and antidepressant/anxiety therapy.   2. Persistent AF - managed with rate control and anticoagulation. Lab today. No bleeding issues noted. Her rate is currently controlled.   3. HLD: intolerant to statins. She has never had ideal lipids. Continue Fenofibrate.Labs today.May need to consider PCSK9 therapy - will see what the cath shows.   4. HTN:BP ok. I have left her on her current regimen for now.    5. Chronic anticoagulation - no problems noted.Lab today.  6. Prior stroke- minimal residual deficit.  7. Situational stress/grief - slowly adjusting. Her 2nd husband recently passed - they were only married for a short time. Her brother passed as well in the fall while undergoing cardiac cath. .   8. Carotid disease - her last study from January showed 1 to 39% bilateral disease - will plan to repeat in January of 2021.   Current medicines are reviewed with the patient today.  The patient does not have concerns regarding medicines other than what has been noted above.  The following changes have been made:  See above.  Labs/ tests ordered today include:    Orders Placed This Encounter  Procedures  . Basic metabolic panel  . CBC  . Hepatic function panel  . Lipid panel  . EKG 12-Lead     Disposition:   FU with me post cath. Further disposition to follow.   Patient is agreeable to this plan and will call if any problems develop in the interim.   SignedTruitt Merle, NP  03/27/2018 9:56 AM  [facility] [facility] [address] [place], [place]  [address] Phone: [phone] Fax: [phone]

## 2018-03-27 NOTE — H&P (View-Only) (Signed)
CARDIOLOGY OFFICE NOTE   Date:  03/27/2018    Alexandra Pierce Date of Birth: [birth year] Medical Record #[id number]  PCP:  Chesley Noon, MD  Cardiologist:  Servando Snare Allred  Chief Complaint  Patient presents with  . Atrial Fibrillation  . Coronary Artery Disease  . Hypertension  . Hyperlipidemia    6 month check - seen for Dr. Rayann Heman    History of Present Illness: Alexandra Pierce is a 72 y.o. female who presents today for a 6 month check. She is seen for Dr. Rayann Heman - former patient of Dr.Tennant's.   She has PAF, remote stroke, and on chronic anticoagulation - now on Xarelto due to labile readings with her coumadin. Other issues include HLD, HTN and obesity. She has had statin intolerance. Prior cath in 2001 with mild CAD and EF of 45 to 50%.  Hospitalized back in March of 2015 with recurrent stroke - multiple infarcts on MRI. Had missed several doses of her Xarelto. Was in atrial fib on arrival to the ER at that time. Understood the need to NOT miss her Xarelto. She had been traveling and really got out of her routine when that happened.   Discharged from [facility] on June 1 of 2017 - had chest pain - was in atrial fib - she was cathed - EF haddropped but was normal by echo. Mild to moderate non obstructive CAD noted on cath with medical management recommended. Flecainide was stopped, metoprolol added and she is now managed with rate control and continued anticoagulation.   I have followed her since - she has done ok - medicines have gotten mixed up from time to time. At last visit in 2023-11-11 - her husband died back in 07-11-2017 and brother had died in 2023-10-12. Had lost her PCP. She was pretty overwhelmed. Cardiac status ok.   Comes in today. Here alone. She notes that she is not doing well. She is having progressive chest tightness and shortness of breath. This is all exertional related. She is to the point that she cannot go and walk - says "it won't let me". Feels  symptoms with inclines as well. It all resolves with rest. She feels like her heart rate speeds up and her "atrial fib acts up" in the setting of chest pressure/tightness. She cannot even bring her trash cans to the street anymore. She admits she is anxious. Not sleeping well. Some crying but not much. She is almost finished with taking care of her husband's affairs. Her brother died on the cath table last fall. She feels like something "is wrong".   Past Medical History:  Diagnosis Date  . Atrial fibrillation ([place])   . Chronic anticoagulation   . Chronic lower back pain   . Hypercholesteremia   . Hypertension    mild  . Obesity   . Paroxysmal atrial fibrillation (HCC)    managed with anticoagulation and rate control.   . Stroke [facility]) 2001 & 2015   "right side gets weak sometimes if I'm only tired" (03/16/2016)    Past Surgical History:  Procedure Laterality Date  . CARDIAC CATHETERIZATION  03/31/2000   EF 49%/LAD lg vessel which coursed to the apex & gave rise to 2 diagonal branches/ LAD noted to have 30% ostial lesion & tapers to a sm vessel toward the apex but no high grade lesion/1st diagonal med sized vessel & 2nd diagonal sm vessel with no significant disease/ lt ventriculogram reveals mild global hypokinesis w/  an estimated EF of 45-50%  . CARDIAC CATHETERIZATION N/A 03/17/2016   Procedure: Left Heart Cath and Coronary Angiography;  Surgeon: Wellington Hampshire, MD;  Location: [place] [facility];  Service: Cardiovascular;  Laterality: N/A;     Medications: Current Meds  Medication Sig  . acetaminophen (TYLENOL) 500 MG tablet Take 1,000 mg by mouth as needed for mild pain or headache.   . cholecalciferol (VITAMIN D) 1000 UNITS tablet Take 1,000 Units by mouth daily.  . fenofibrate 160 MG tablet Take 1 tablet (160 mg total) by mouth daily.  . metoprolol tartrate (LOPRESSOR) 25 MG tablet Take 1 tablet (25 mg total) by mouth 2 (two) times daily.  . [DISCONTINUED] rivaroxaban (XARELTO)  20 MG TABS tablet Take 1 tablet (20 mg total) by mouth daily.  . rivaroxaban (XARELTO) 20 MG TABS tablet Take 1 tablet (20 mg total) by mouth daily.   Current Facility-Administered Medications for the 03/27/18 encounter (Office Visit) with Burtis Junes, NP  Medication  . 0.9 %  sodium chloride infusion  . 0.9 %  sodium chloride infusion  . metoprolol tartrate (LOPRESSOR) tablet 25 mg     Allergies: Allergies  Allergen Reactions  . Sulfonamide Derivatives Anaphylaxis  . Lipitor [Atorvastatin] Other (See Comments)    Caused bladder infection  . Pravachol [Pravastatin Sodium] Other (See Comments)    Myalgias  . Statins Other (See Comments)    Myalgia    Social History: The patient  reports that she quit smoking about 39 years ago. Her smoking use included cigarettes. She has a 1.80 pack-year smoking history. She has never used smokeless tobacco. She reports that she drinks about 8.4 oz of alcohol per week. She reports that she does not use drugs.   Family History: The patient's family history includes Asthma in her father; Diabetes in her father; Heart disease in her brother, father, and mother; Hyperlipidemia in her brother, brother, and sister.   Review of Systems: Please see the history of present illness.   Otherwise, the review of systems is positive for none.   All other systems are reviewed and negative.   Physical Exam: VS:  BP 114/70   Pulse 78   Ht 5\' 6"  (1.676 m)   Wt 180 lb (81.6 kg)   SpO2 93%   BMI 29.05 kg/m  .  BMI Body mass index is 29.05 kg/m.  Wt Readings from Last 3 Encounters:  03/27/18 180 lb (81.6 kg)  10/03/17 182 lb (82.6 kg)  04/13/17 175 lb 1.9 oz (79.4 kg)    General: Pleasant. She does not look her stated age. She is alert and in no acute distress.   HEENT: Normal.  Neck: Supple, no JVD, carotid bruits, or masses noted.  Cardiac: Irregular irregular rhythm. Her rate is ok. No real murmur that I appreciate.  No edema.  Respiratory:   Lungs are clear to auscultation bilaterally with normal work of breathing.  GI: Soft and nontender.  MS: No deformity or atrophy. Gait and ROM intact.  Skin: Warm and dry. Color is normal.  Neuro:  Strength and sensation are intact and no gross focal deficits noted.  Psych: Alert, appropriate and with normal affect.   LABORATORY DATA:  EKG:  EKG is ordered today. This shows chronic AF with controlled VR. HR today is 78.   Lab Results  Component Value Date   WBC 5.9 10/03/2017   HGB 12.3 10/03/2017   HCT 38.1 10/03/2017   PLT 410 (H) 10/03/2017  GLUCOSE 107 (H) 10/03/2017   CHOL 234 (H) 10/03/2017   TRIG 103 10/03/2017   HDL 49 10/03/2017   LDLDIRECT 163.2 01/19/2013   LDLCALC 164 (H) 10/03/2017   ALT 13 10/03/2017   AST 19 10/03/2017   NA 142 10/03/2017   K 4.7 10/03/2017   CL 104 10/03/2017   CREATININE 0.81 10/03/2017   BUN 18 10/03/2017   CO2 24 10/03/2017   INR 1.40 03/17/2016   HGBA1C 5.6 01/11/2014     BNP (last 3 results) No results for input(s): BNP in the last 8760 hours.  ProBNP (last 3 results) No results for input(s): PROBNP in the last 8760 hours.   Other Studies Reviewed Today:   Cardiac Cath Conclusion from 02/2016    Mid RCA lesion, 20% stenosed.  Mid Cx lesion, 20% stenosed.  Dist LAD lesion, 50% stenosed.  Mid LAD lesion, 20% stenosed.  Ost LAD lesion, 30% stenosed.  There is moderate to severe left ventricular systolic dysfunction.  1. Mild to moderate nonobstructive coronary artery disease. 2. Moderately to severely reduced LV systolic function with an ejection fraction of 30-35% with moderate mitral regurgitation. 3. Normal LVEDP.   Recommendations: I don't see a culprit for unstable angina. The patient clearly has worsening LV systolic dysfunction. I wonder if this is related to tachycardia-induced cardiomyopathy given that the patient has been in atrial fibrillation over the last 6-12 months. I discontinued flecainide and  added metoprolol which can be switched to long-acting before discharge. I resumed ramipril. Xarelto can be resumed tomorrow.    Echo Study Conclusions from 02/2016  - Left ventricle: The cavity size was normal. Systolic function was  normal. The estimated ejection fraction was 55%. Wall motion was  normal; there were no regional wall motion abnormalities. Normal  sinus rhythm was absent. The study is not technically sufficient  to allow evaluation of LV diastolic function. - Aortic valve: Moderately calcified annulus. Trileaflet; mildly  thickened, mildly calcified leaflets. - Mitral valve: There was mild regurgitation. - Left atrium: The atrium was moderately dilated. - Pulmonary arteries: PA peak pressure: 32 mm Hg (S).   Carotid Doppler Summary 2015: Bilateral: intimal wall thickening CCA. Mild soft plaque origin ICA. 1-39% ICA stenosis. Vertebral artery flow is antegrade.  Other specific details can be found in the table(s) above. Prepared and Electronically Authenticated by  Antony Contras 2015-03-30T10:55:00.567   Assessment / Plan:  1. CAD with angina/Chest pain:Cardiac cath 03/17/16 with mild to moderate non-obstructive CAD. Medical management of CAD was recommended at that time. LVEF noted to be reduced on LV gram but echo the same day shoeds normal LV function. I had Dr. Angelena Form previously review the echo images and the inferior wall was noted to be moving well. He estimates her overall LVEF by echo to be about 50%. He speculated that the abnormality on LV function noted on LV gram may have been due to PVCs just before the imaging. She did notwishto pursue other testing - cardiac MRI.   Now with more chest pressure - with exertion - worrisome for progression of her CAD. Her lipids have never been ideally treated due to her statin intolerance. She is agreeable to proceeding with cardiac cath - discussed with pharmacy and due to prior history of stroke and  normal renal function - will hold her Xarelto for 24 hours only prior to cath.   The patient understands that risks include but are not limited to stroke (1 in 1000), death (1 in 39), kidney failure [  usually temporary] (1 in 500), bleeding (1 in 200), allergic reaction [possibly serious] (1 in 200), and agrees to proceed. This has been scheduled for this Friday with Dr. Martinique.   If this turns out ok, may need to consider PFTs and antidepressant/anxiety therapy.   2. Persistent AF - managed with rate control and anticoagulation. Lab today. No bleeding issues noted. Her rate is currently controlled.   3. HLD: intolerant to statins. She has never had ideal lipids. Continue Fenofibrate.Labs today.May need to consider PCSK9 therapy - will see what the cath shows.   4. HTN:BP ok. I have left her on her current regimen for now.    5. Chronic anticoagulation - no problems noted.Lab today.  6. Prior stroke- minimal residual deficit.  7. Situational stress/grief - slowly adjusting. Her 2nd husband recently passed - they were only married for a short time. Her brother passed as well in the fall while undergoing cardiac cath. .   8. Carotid disease - her last study from January showed 1 to 39% bilateral disease - will plan to repeat in January of 2021.   Current medicines are reviewed with the patient today.  The patient does not have concerns regarding medicines other than what has been noted above.  The following changes have been made:  See above.  Labs/ tests ordered today include:    Orders Placed This Encounter  Procedures  . Basic metabolic panel  . CBC  . Hepatic function panel  . Lipid panel  . EKG 12-Lead     Disposition:   FU with me post cath. Further disposition to follow.   Patient is agreeable to this plan and will call if any problems develop in the interim.   SignedTruitt Merle, NP  03/27/2018 9:56 AM  [facility] [address] [place], [place]  [address] Phone: [phone] Fax: [phone]

## 2018-03-27 NOTE — Patient Instructions (Addendum)
We will be checking the following labs today - BMET, CBC, HPF and lipids   Medication Instructions:    Continue with your current medicines.     Testing/Procedures To Be Arranged:  Cardiac catheterization  Follow-Up:   Will see how your study turns out.     Other Special Instructions:  Your provider has recommended a cardiac catherization  You are scheduled for a cardiac catheterization on Friday, June 14th at 7:30 AM with Dr. Martinique or associate.  Please arrive at the [facility] (Main Entrance) at [facility] at [address], [place] Stay on Friday, June 14th at 5:30AM.    Special note: Every effort is made to have your procedure done on time.   Please understand that emergencies sometimes delay a scheduled   procedure.  No solid feeds after midnight on Thursday. You may have clear liquids until 5 am on the day of your procedure.   You may take your morning medications with a sip of water on the day of your procedure.  Please take a baby aspirin (81 mg) on the morning of your procedure.   Medications to HOLD - Xarelto 24 hours prior to the procedure.   Plan for a one night stay -- bring personal belongings.  Bring a current list of your medications and current insurance cards.  You MUST have a responsible person to drive you home. Someone MUST be with you the first 24 hours after you arrive home or your discharge will be delayed. Wear clothes that are easy to get on and off and wear slip on shoes.    Coronary Angiogram A coronary angiogram, also called coronary angiography, is an X-ray procedure used to look at the arteries in the heart. In this procedure, a dye (contrast dye) is injected through a long, hollow tube (catheter). The catheter is about the size of a piece of cooked spaghetti and is inserted through your groin, wrist, or arm. The dye is injected into each artery, and X-rays are then taken to show if there is a blockage in  the arteries of your heart.  LET [facility] CARE PROVIDER KNOW ABOUT: Any allergies you have, including allergies to shellfish or contrast dye.   All medicines you are taking, including vitamins, herbs, eye drops, creams, and over-the-counter medicines.   Previous problems you or members of your family have had with the use of anesthetics.   Any blood disorders you have.   Previous surgeries you have had. History of kidney problems or failure.   Other medical conditions you have.  RISKS AND COMPLICATIONS  Generally, a coronary angiogram is a safe procedure. However, about 1 person out of 1000 can have problems that may include: Allergic reaction to the dye. Bleeding/bruising from the access site or other locations. Kidney injury, especially in people with impaired kidney function.  Stroke (rare). Heart attack (rare). Irregular rhythms (rare) Death (rare)  BEFORE THE PROCEDURE  Do not eat or drink anything after midnight the night before the procedure or as directed by your health care provider.   Ask your health care provider about changing or stopping your regular medicines. This is especially important if you are taking diabetes medicines or blood thinners.  PROCEDURE You may be given a medicine to help you relax (sedative) before the procedure. This medicine is given through an intravenous (IV) access tube that is inserted into one of your veins.   The area where the catheter will be  inserted will be washed and shaved. This is usually done in the groin but may be done in the fold of your arm (near your elbow) or in the wrist.    A medicine will be given to numb the area where the catheter will be inserted (local anesthetic).   The health care provider will insert the catheter into an artery. The catheter will be guided by using a special type of X-ray (fluoroscopy) of the blood vessel being examined.   A special dye will then be injected into the catheter, and X-rays will be taken.  The dye will help to show where any narrowing or blockages are located in the heart arteries.     AFTER THE PROCEDURE  If the procedure is done through the leg, you will be kept in bed lying flat for several hours. You will be instructed to not bend or cross your legs. The insertion site will be checked frequently.   The pulse in your feet or wrist will be checked frequently.   Additional blood tests, X-rays, and an electrocardiogram may be done.          If you need a refill on your cardiac medications before your next appointment, please call your pharmacy.   Call the [place] office at [phone] if you have any questions, problems or concerns.

## 2018-03-28 LAB — HEPATIC FUNCTION PANEL
ALT: 12 IU/L (ref 0–32)
AST: 17 IU/L (ref 0–40)
Albumin: 4.5 g/dL (ref 3.5–4.8)
Alkaline Phosphatase: 43 IU/L (ref 39–117)
Bilirubin Total: 1 mg/dL (ref 0.0–1.2)
Bilirubin, Direct: 0.29 mg/dL (ref 0.00–0.40)
Total Protein: 6.9 g/dL (ref 6.0–8.5)

## 2018-03-28 LAB — BASIC METABOLIC PANEL
BUN/Creatinine Ratio: 18 (ref 12–28)
BUN: 16 mg/dL (ref 8–27)
CO2: 24 mmol/L (ref 20–29)
Calcium: 8.8 mg/dL (ref 8.7–10.3)
Chloride: 105 mmol/L (ref 96–106)
Creatinine, Ser: 0.91 mg/dL (ref 0.57–1.00)
GFR calc Af Amer: 72 mL/min/{1.73_m2} (ref >59)
GFR calc non Af Amer: 63 mL/min/{1.73_m2} (ref >59)
Glucose: 106 mg/dL — ABNORMAL HIGH (ref 65–99)
Potassium: 5.2 mmol/L (ref 3.5–5.2)
Sodium: 140 mmol/L (ref 134–144)

## 2018-03-28 LAB — LIPID PANEL
Chol/HDL Ratio: 3.9 ratio (ref 0.0–4.4)
Cholesterol, Total: 201 mg/dL — ABNORMAL HIGH (ref 100–199)
HDL: 51 mg/dL (ref >39)
LDL Calculated: 135 mg/dL — ABNORMAL HIGH (ref 0–99)
Triglycerides: 75 mg/dL (ref 0–149)
VLDL Cholesterol Cal: 15 mg/dL (ref 5–40)

## 2018-03-28 LAB — CBC
Hematocrit: 33.2 % — ABNORMAL LOW (ref 34.0–46.6)
Hemoglobin: 11.4 g/dL (ref 11.1–15.9)
MCH: 32.9 pg (ref 26.6–33.0)
MCHC: 34.3 g/dL (ref 31.5–35.7)
MCV: 96 fL (ref 79–97)
Platelets: 417 10*3/uL (ref 150–450)
RBC: 3.46 x10E6/uL — ABNORMAL LOW (ref 3.77–5.28)
RDW: 14.5 % (ref 12.3–15.4)
WBC: 5.5 10*3/uL (ref 3.4–10.8)

## 2018-03-30 ENCOUNTER — Telehealth: Payer: Self-pay | Admitting: *Deleted

## 2018-03-30 NOTE — Telephone Encounter (Signed)
Catheterization scheduled at [facility] for: Friday March 31, 2018 7:30 AM Verifyarrival time and place: [place] Entrance A at: 5:30 AM  No solid food after midnight prior to cath, clear liquids until 5 AM day of procedure. Verify allergies in Epic Verify no diabetes medications.  Hold: Xarelto-hold 24 hours prior to procedure-history of stroke  Except hold medications AM meds can be  taken pre-cath with sip of water including: ASA 81 mg  Confirmed patient has responsible person to drive home post procedure and observe patient for 24 hours  LMTCB to discuss instructions with patient.

## 2018-03-30 NOTE — Telephone Encounter (Signed)
LMTCB to discuss instructions 

## 2018-03-31 ENCOUNTER — Encounter (HOSPITAL_COMMUNITY): Payer: Self-pay | Admitting: Cardiology

## 2018-03-31 ENCOUNTER — Ambulatory Visit (HOSPITAL_COMMUNITY)
Admission: RE | Admit: 2018-03-31 | Discharge: 2018-03-31 | Disposition: A | Discharge status: Home / Self Care | Payer: Medicare Other | Source: Ambulatory Visit | Attending: Cardiology | Admitting: Cardiology

## 2018-03-31 ENCOUNTER — Ambulatory Visit (HOSPITAL_COMMUNITY)
Admission: RE | Disposition: A | Discharge status: Home / Self Care | Payer: Self-pay | Source: Ambulatory Visit | Attending: Cardiology

## 2018-03-31 DIAGNOSIS — I482 Chronic atrial fibrillation, unspecified: Secondary | ICD-10-CM

## 2018-03-31 DIAGNOSIS — Z87891 Personal history of nicotine dependence: Secondary | ICD-10-CM | POA: Diagnosis not present

## 2018-03-31 DIAGNOSIS — R0789 Other chest pain: Secondary | ICD-10-CM | POA: Diagnosis present

## 2018-03-31 DIAGNOSIS — I259 Chronic ischemic heart disease, unspecified: Secondary | ICD-10-CM

## 2018-03-31 DIAGNOSIS — Z8673 Personal history of transient ischemic attack (TIA), and cerebral infarction without residual deficits: Secondary | ICD-10-CM | POA: Insufficient documentation

## 2018-03-31 DIAGNOSIS — Z7901 Long term (current) use of anticoagulants: Secondary | ICD-10-CM | POA: Diagnosis not present

## 2018-03-31 DIAGNOSIS — I2584 Coronary atherosclerosis due to calcified coronary lesion: Secondary | ICD-10-CM | POA: Diagnosis not present

## 2018-03-31 DIAGNOSIS — I481 Persistent atrial fibrillation: Secondary | ICD-10-CM | POA: Insufficient documentation

## 2018-03-31 DIAGNOSIS — Z955 Presence of coronary angioplasty implant and graft: Secondary | ICD-10-CM | POA: Diagnosis not present

## 2018-03-31 DIAGNOSIS — Z882 Allergy status to sulfonamides status: Secondary | ICD-10-CM | POA: Diagnosis not present

## 2018-03-31 DIAGNOSIS — I480 Paroxysmal atrial fibrillation: Secondary | ICD-10-CM | POA: Insufficient documentation

## 2018-03-31 DIAGNOSIS — I2510 Atherosclerotic heart disease of native coronary artery without angina pectoris: Secondary | ICD-10-CM

## 2018-03-31 DIAGNOSIS — I25119 Atherosclerotic heart disease of native coronary artery with unspecified angina pectoris: Secondary | ICD-10-CM | POA: Insufficient documentation

## 2018-03-31 DIAGNOSIS — Z6829 Body mass index (BMI) 29.0-29.9, adult: Secondary | ICD-10-CM | POA: Diagnosis not present

## 2018-03-31 DIAGNOSIS — E669 Obesity, unspecified: Secondary | ICD-10-CM | POA: Insufficient documentation

## 2018-03-31 DIAGNOSIS — I6523 Occlusion and stenosis of bilateral carotid arteries: Secondary | ICD-10-CM | POA: Insufficient documentation

## 2018-03-31 DIAGNOSIS — I10 Essential (primary) hypertension: Secondary | ICD-10-CM | POA: Diagnosis present

## 2018-03-31 DIAGNOSIS — F4321 Adjustment disorder with depressed mood: Secondary | ICD-10-CM | POA: Diagnosis not present

## 2018-03-31 DIAGNOSIS — Z79899 Other long term (current) drug therapy: Secondary | ICD-10-CM | POA: Diagnosis not present

## 2018-03-31 DIAGNOSIS — I4891 Unspecified atrial fibrillation: Secondary | ICD-10-CM | POA: Diagnosis present

## 2018-03-31 DIAGNOSIS — E7800 Pure hypercholesterolemia, unspecified: Secondary | ICD-10-CM | POA: Insufficient documentation

## 2018-03-31 DIAGNOSIS — R079 Chest pain, unspecified: Secondary | ICD-10-CM | POA: Diagnosis present

## 2018-03-31 DIAGNOSIS — Z8249 Family history of ischemic heart disease and other diseases of the circulatory system: Secondary | ICD-10-CM | POA: Insufficient documentation

## 2018-03-31 DIAGNOSIS — E7849 Other hyperlipidemia: Secondary | ICD-10-CM

## 2018-03-31 DIAGNOSIS — Z888 Allergy status to other drugs, medicaments and biological substances status: Secondary | ICD-10-CM | POA: Insufficient documentation

## 2018-03-31 DIAGNOSIS — I25110 Atherosclerotic heart disease of native coronary artery with unstable angina pectoris: Secondary | ICD-10-CM | POA: Diagnosis present

## 2018-03-31 HISTORY — PX: LEFT HEART CATH AND CORONARY ANGIOGRAPHY: CATH118249

## 2018-03-31 SURGERY — LEFT HEART CATH AND CORONARY ANGIOGRAPHY
Anesthesia: LOCAL

## 2018-03-31 MED ORDER — ONDANSETRON HCL 4 MG/2ML IJ SOLN
4.0000 mg | Freq: Four times a day (QID) | INTRAMUSCULAR | Status: DC | PRN
Start: 2018-03-31 — End: 2018-03-31

## 2018-03-31 MED ORDER — ASPIRIN 81 MG PO CHEW: 81.0000 mg | CHEWABLE_TABLET | ORAL | Status: DC

## 2018-03-31 MED ORDER — FENTANYL CITRATE (PF) 100 MCG/2ML IJ SOLN
INTRAMUSCULAR | Status: DC | PRN
  Administered 2018-03-31: 25 ug via INTRAVENOUS

## 2018-03-31 MED ORDER — HEPARIN (PORCINE) IN NACL 1000-0.9 UT/500ML-% IV SOLN
INTRAVENOUS | Status: AC
  Filled 2018-03-31: qty 1000

## 2018-03-31 MED ORDER — IOHEXOL 350 MG/ML SOLN
INTRAVENOUS | Status: DC | PRN
  Administered 2018-03-31: 45 mL via INTRA_ARTERIAL

## 2018-03-31 MED ORDER — MIDAZOLAM HCL 2 MG/2ML IJ SOLN
INTRAMUSCULAR | Status: DC | PRN
  Administered 2018-03-31: 1 mg via INTRAVENOUS

## 2018-03-31 MED ORDER — SODIUM CHLORIDE 0.9% FLUSH: 3.0000 mL | INTRAVENOUS | Status: DC | PRN

## 2018-03-31 MED ORDER — SODIUM CHLORIDE 0.9 % WEIGHT BASED INFUSION: 1.0000 mL/kg/h | INTRAVENOUS | Status: DC

## 2018-03-31 MED ORDER — RIVAROXABAN 20 MG PO TABS: 20.0000 mg | ORAL_TABLET | Freq: Every day | ORAL | 11 refills | Status: DC

## 2018-03-31 MED ORDER — HEPARIN (PORCINE) IN NACL 2-0.9 UNITS/ML
INTRAMUSCULAR | Status: AC | PRN
  Administered 2018-03-31: 1000 mL

## 2018-03-31 MED ORDER — MIDAZOLAM HCL 2 MG/2ML IJ SOLN
INTRAMUSCULAR | Status: AC
  Filled 2018-03-31: qty 2

## 2018-03-31 MED ORDER — SODIUM CHLORIDE 0.9 % IV SOLN: 250.0000 mL | INTRAVENOUS | Status: DC | PRN

## 2018-03-31 MED ORDER — LIDOCAINE HCL (PF) 1 % IJ SOLN
INTRAMUSCULAR | Status: AC
  Filled 2018-03-31: qty 30

## 2018-03-31 MED ORDER — SODIUM CHLORIDE 0.9% FLUSH: 3.0000 mL | Freq: Two times a day (BID) | INTRAVENOUS | Status: DC

## 2018-03-31 MED ORDER — LIDOCAINE HCL (PF) 1 % IJ SOLN
INTRAMUSCULAR | Status: DC | PRN
  Administered 2018-03-31: 2 mL via INTRADERMAL
  Administered 2018-03-31: 15 mL via INTRADERMAL

## 2018-03-31 MED ORDER — ACETAMINOPHEN 325 MG PO TABS: 650.0000 mg | ORAL_TABLET | ORAL | Status: DC | PRN

## 2018-03-31 MED ORDER — SODIUM CHLORIDE 0.9 % IV SOLN
INTRAVENOUS | Status: DC
  Administered 2018-03-31: 06:00:00 via INTRAVENOUS

## 2018-03-31 MED ORDER — FENTANYL CITRATE (PF) 100 MCG/2ML IJ SOLN
INTRAMUSCULAR | Status: AC
  Filled 2018-03-31: qty 2

## 2018-03-31 SURGICAL SUPPLY — 14 items
CATH INFINITI 5FR MULTPACK ANG (CATHETERS) ×2 IMPLANT
COVER PRB 48X5XTLSCP FOLD TPE (BAG) ×1 IMPLANT
COVER PROBE 5X48 (BAG) ×1
GUIDEWIRE INQWIRE 1.5J.035X260 (WIRE) ×1 IMPLANT
INQWIRE 1.5J .035X260CM (WIRE) ×2
KIT HEART LEFT (KITS) ×2 IMPLANT
NEEDLE PERC 21GX4CM (NEEDLE) ×2 IMPLANT
PACK CARDIAC CATHETERIZATION (CUSTOM PROCEDURE TRAY) ×2 IMPLANT
SHEATH AVANTI 11CM 5FR (SHEATH) ×2 IMPLANT
SHEATH RAIN RADIAL 21G 6FR (SHEATH) ×2 IMPLANT
SYR MEDRAD MARK V 150ML (SYRINGE) ×2 IMPLANT
TRANSDUCER W/STOPCOCK (MISCELLANEOUS) ×2 IMPLANT
TUBING CIL FLEX 10 FLL-RA (TUBING) ×2 IMPLANT
WIRE EMERALD 3MM-J .035X150CM (WIRE) ×2 IMPLANT

## 2018-03-31 NOTE — Discharge Instructions (Addendum)
**Note -Identified via Obfuscation** May resume Xarelto tomorrow 04/01/18 Femoral Site Care Refer to this sheet in the next few weeks. These instructions provide you with information about caring for yourself after your procedure. Your health care provider may also give you more specific instructions. Your treatment has been planned according to current medical practices, but problems sometimes occur. Call your health care provider if you have any problems or questions after your procedure. What can I expect after the procedure? After your procedure, it is typical to have the following:  Bruising at the site that usually fades within 1-2 weeks.  Blood collecting in the tissue (hematoma) that may be painful to the touch. It should usually decrease in size and tenderness within 1-2 weeks.  Follow these instructions at home:  Take medicines only as directed by your health care provider.  You may shower 24-48 hours after the procedure or as directed by your health care provider. Remove the bandage (dressing) and gently wash the site with plain soap and water. Pat the area dry with a clean towel. Do not rub the site, because this may cause bleeding.  Do not take baths, swim, or use a hot tub until your health care provider approves.  Check your insertion site every day for redness, swelling, or drainage.  Do not apply powder or lotion to the site.  Limit use of stairs to twice a day for the first 2-3 days or as directed by your health care provider.  Do not squat for the first 2-3 days or as directed by your health care provider.  Do not lift over 10 lb (4.5 kg) for 5 days after your procedure or as directed by your health care provider.  Ask your health care provider when it is okay to: ? Return to work or school. ? Resume usual physical activities or sports. ? Resume sexual activity.  Do not drive home if you are discharged the same day as the procedure. Have someone else drive you.  You may drive 24 hours after the  procedure unless otherwise instructed by your health care provider.  Do not operate machinery or power tools for 24 hours after the procedure or as directed by your health care provider.  If your procedure was done as an outpatient procedure, which means that you went home the same day as your procedure, a responsible adult should be with you for the first 24 hours after you arrive home.  Keep all follow-up visits as directed by your health care provider. This is important. Contact a health care provider if:  You have a fever.  You have chills.  You have increased bleeding from the site. Hold pressure on the site. Get help right away if:  You have unusual pain at the site.  You have redness, warmth, or swelling at the site.  You have drainage (other than a small amount of blood on the dressing) from the site.  The site is bleeding, and the bleeding does not stop after 30 minutes of holding steady pressure on the site.  Your leg or foot becomes pale, cool, tingly, or numb. This information is not intended to replace advice given to you by your health care provider. Make sure you discuss any questions you have with your health care provider. Document Released: 06/07/2014 Document Revised: 03/11/2016 Document Reviewed: 04/23/2014 Elsevier Interactive Patient Education  [employer].

## 2018-03-31 NOTE — Progress Notes (Signed)
Site area: Scientific laboratory technician Prior to Removal:  Level 0 Pressure Applied For: 20 min Manual:   Yes Patient Status During Pull:  A/O Post Pull Site:  Level 0 Post Pull Instructions Given:  Yes, and pt understands. Post Pull Pulses Present: 2+ rt pt dp Dressing Applied:  tegaderm and a 4x4 Bedrest begins @ 08:50:00 Comments: Pt leaves Cath Lab holding area in stable condition. Rt groin is unremarkable. Dressing to Rt groin is CDI.

## 2018-03-31 NOTE — Interval H&P Note (Signed)
History and Physical Interval Note:  03/31/2018 7:18 AM  Alexandra Pierce  has presented today for surgery, with the diagnosis of ua - cp  The various methods of treatment have been discussed with the patient and family. After consideration of risks, benefits and other options for treatment, the patient has consented to  Procedure(s): LEFT HEART CATH AND CORONARY ANGIOGRAPHY (N/A) as a surgical intervention .  The patient's history has been reviewed, patient examined, no change in status, stable for surgery.  I have reviewed the patient's chart and labs.  Questions were answered to the patient's satisfaction.    Cath Lab Visit (complete for each Cath Lab visit)  Clinical Evaluation Leading to the Procedure:   ACS: No.  Non-ACS:    Anginal Classification: CCS III  Anti-ischemic medical therapy: Minimal Therapy (1 class of medications)  Non-Invasive Test Results: No non-invasive testing performed  Prior CABG: No previous CABG       Col[facility] 03/31/2018 7:18 AM

## 2018-04-03 MED FILL — Heparin Sod (Porcine)-NaCl IV Soln 1000 Unit/500ML-0.9%: INTRAVENOUS | Qty: 1000 | Status: AC

## 2018-04-03 NOTE — Telephone Encounter (Signed)
LHC done 03/31/18.

## 2018-04-11 ENCOUNTER — Ambulatory Visit
Admission: RE | Admit: 2018-04-11 | Discharge: 2018-04-11 | Disposition: A | Discharge status: Home / Self Care | Payer: Medicare Other | Source: Ambulatory Visit | Attending: Nurse Practitioner | Admitting: Nurse Practitioner

## 2018-04-11 ENCOUNTER — Telehealth: Payer: Self-pay | Admitting: *Deleted

## 2018-04-11 ENCOUNTER — Encounter: Payer: Self-pay | Admitting: Nurse Practitioner

## 2018-04-11 ENCOUNTER — Ambulatory Visit: Payer: Medicare Other | Admitting: Nurse Practitioner

## 2018-04-11 VITALS — BP 118/78 | HR 75 | Ht 66.0 in | Wt 183.0 lb

## 2018-04-11 DIAGNOSIS — R0602 Shortness of breath: Secondary | ICD-10-CM

## 2018-04-11 NOTE — Telephone Encounter (Signed)
Made pt consult appt for Dr. Lake Bells @ [phone].

## 2018-04-11 NOTE — Progress Notes (Signed)
CARDIOLOGY OFFICE NOTE  Date:  04/11/2018    Alexandra Pierce Date of Birth: 08/15/1946 Medical Record #[id number]  PCP:  Chesley Noon, MD  Cardiologist:  Servando Snare Allred    Chief Complaint  Patient presents with  . Follow-up    Post cath visit - seen for Dr. Rayann Heman    History of Present Illness: Alexandra Pierce is a 72 y.o. female who presents today for a post cath visit. She is seen for Dr. Rayann Heman - former patient of Dr.Tennant's.   She has PAF, remote stroke, and on chronic anticoagulation - now on Xarelto due to labile readings with her coumadin. Other issues include HLD, HTN and obesity. She has had statin intolerance. Prior cath in 2001 with mild CAD and EF of 45 to 50%.  Hospitalized back in March of 2015 with recurrent stroke - multiple infarcts on MRI. Had missed several doses of her Xarelto. Was in atrial fib on arrival to the ER at that time. Understood the need to NOT miss her Xarelto. She had been traveling and really got out of her routine when that happened.   Discharged from [facility] on June 1 of 2017 - had chest pain - was in atrial fib - she was cathed - EF haddropped but was normal by echo. Mild to moderate non obstructive CAD noted on cath with medical management recommended. The LVEF noted to be reduced on LV gram but echo that same day showed normal LV function. I had Dr. Angelena Form previously review the echo images and the inferior wall was noted to be moving well. He estimateed her overall LVEF by echo to be about 50%. He speculated that the abnormality on LV function noted on LV gram may have been due to PVCs just before the imaging. Flecainide was stopped, metoprolol added and she is now managed with rate control and continued anticoagulation.   I have followed her since - she has done ok - medicines have gotten mixed up from time to time. At last visit in 10-30-23 - her husband died back in 06-29-2017 and brother had died in 2023/09/30. Had lost her PCP.  She was pretty overwhelmed. Cardiac status ok.   I saw her earlier this month - she was not doing well. Have exertional chest pain and shortness of breath - to the point she could no longer take out her trash cans to the street. She was referred for repeat cath - this turned out ok.  Comes in today. Here alone. While she is happy with how her cath turned out - her symptoms still persist. She feels short of breath with just sitting which worsens with minimal exertion. She does not seem overly anxious or depressed to me or to her. She still feels like something is "not right". Not typical at all for her to complain. No problems with her cath site. Back on her Xarelto.   Past Medical History:  Diagnosis Date  . Atrial fibrillation ([place])   . Chronic anticoagulation   . Chronic lower back pain   . Hypercholesteremia   . Hypertension    mild  . Obesity   . Paroxysmal atrial fibrillation ([facility])    managed with anticoagulation and rate control.   . Stroke [facility]) 2001 & 2015   "right side gets weak sometimes if I'm only tired" (03/16/2016)    Past Surgical History:  Procedure Laterality Date  . CARDIAC CATHETERIZATION  03/31/2000   EF 49%/LAD lg vessel  which coursed to the apex & gave rise to 2 diagonal branches/ LAD noted to have 30% ostial lesion & tapers to a sm vessel toward the apex but no high grade lesion/1st diagonal med sized vessel & 2nd diagonal sm vessel with no significant disease/ lt ventriculogram reveals mild global hypokinesis w/ an estimated EF of 45-50%  . CARDIAC CATHETERIZATION N/A 03/17/2016   Procedure: Left Heart Cath and Coronary Angiography;  Surgeon: Wellington Hampshire, MD;  Location: [place] [facility];  Service: Cardiovascular;  Laterality: N/A;  . LEFT HEART CATH AND CORONARY ANGIOGRAPHY N/A 03/31/2018   Procedure: LEFT HEART CATH AND CORONARY ANGIOGRAPHY;  Surgeon: Martinique, Peter M, MD;  Location: [place] [facility];  Service: Cardiovascular;  Laterality: N/A;      Medications: Current Meds  Medication Sig  . acetaminophen (TYLENOL) 500 MG tablet Take 1,000 mg by mouth as needed for mild pain or headache.   . cholecalciferol (VITAMIN D) 1000 UNITS tablet Take 1,000 Units by mouth daily.  . fenofibrate 160 MG tablet Take 1 tablet (160 mg total) by mouth daily.  . metoprolol tartrate (LOPRESSOR) 25 MG tablet Take 1 tablet (25 mg total) by mouth 2 (two) times daily.  . rivaroxaban (XARELTO) 20 MG TABS tablet Take 1 tablet (20 mg total) by mouth daily.     Allergies: Allergies  Allergen Reactions  . Sulfonamide Derivatives Anaphylaxis  . Lipitor [Atorvastatin] Other (See Comments)    Caused bladder infection  . Pravachol [Pravastatin Sodium] Other (See Comments)    Myalgias  . Statins Other (See Comments)    Myalgia    Social History: The patient  reports that she quit smoking about 39 years ago. Her smoking use included cigarettes. She has a 1.80 pack-year smoking history. She has never used smokeless tobacco. She reports that she drinks about 8.4 oz of alcohol per week. She reports that she does not use drugs.   Family History: The patient's family history includes Asthma in her father; Diabetes in her father; Heart disease in her brother, father, and mother; Hyperlipidemia in her brother, brother, and sister.   Review of Systems: Please see the history of present illness.   Otherwise, the review of systems is positive for none.   All other systems are reviewed and negative.   Physical Exam: VS:  BP 118/78 (BP Location: Left Arm, Patient Position: Sitting, Cuff Size: Normal)   Pulse 75   Ht 5\' 6"  (1.676 m)   Wt 183 lb (83 kg)   SpO2 96% Comment: at rest  BMI 29.54 kg/m  .  BMI Body mass index is 29.54 kg/m.  Wt Readings from Last 3 Encounters:  04/11/18 183 lb (83 kg)  03/31/18 180 lb (81.6 kg)  03/27/18 180 lb (81.6 kg)   Her oxygen sat is 99% by me.  General: Pleasant. Well developed, well nourished and in no acute  distress.   HEENT: Normal.  Neck: Supple, no JVD, carotid bruits, or masses noted.  Cardiac: Irregular irregular rhythm. Her rate is ok.  No murmurs, rubs, or gallops. No edema.  Respiratory:  Lungs are clear to auscultation bilaterally with normal work of breathing.  GI: Soft and nontender.  MS: No deformity or atrophy. Gait and ROM intact.  Skin: Warm and dry. Color is normal.  Neuro:  Strength and sensation are intact and no gross focal deficits noted.  Psych: Alert, appropriate and with normal affect.   LABORATORY DATA:  EKG:  EKG is not ordered today.  Lab Results  Component Value Date   WBC 5.5 03/27/2018   HGB 11.4 03/27/2018   HCT 33.2 (L) 03/27/2018   PLT 417 03/27/2018   GLUCOSE 106 (H) 03/27/2018   CHOL 201 (H) 03/27/2018   TRIG 75 03/27/2018   HDL 51 03/27/2018   LDLDIRECT 163.2 01/19/2013   LDLCALC 135 (H) 03/27/2018   ALT 12 03/27/2018   AST 17 03/27/2018   NA 140 03/27/2018   K 5.2 03/27/2018   CL 105 03/27/2018   CREATININE 0.91 03/27/2018   BUN 16 03/27/2018   CO2 24 03/27/2018   INR 1.40 03/17/2016   HGBA1C 5.6 01/11/2014     BNP (last 3 results) No results for input(s): BNP in the last 8760 hours.  ProBNP (last 3 results) No results for input(s): PROBNP in the last 8760 hours.   Other Studies Reviewed Today:  LEFT HEART CATH AND CORONARY ANGIOGRAPHY June 2019  Conclusion     Dist LAD lesion is 40% stenosed.  Mid LAD lesion is 20% stenosed.  Ost LAD lesion is 30% stenosed.  Mid Cx lesion is 20% stenosed.  Mid RCA lesion is 20% stenosed.  The left ventricular systolic function is normal.  LV end diastolic pressure is normal.  The left ventricular ejection fraction is 55-65% by visual estimate.   1. Mild nonobstructive CAD 2. Normal LV function 3. Normal LVEDP  Plan: There is no change compared to 2017. I cannot explain her symptoms based on findings today.   Echo Study Conclusions from 02/2016  - Left ventricle: The  cavity size was normal. Systolic function was  normal. The estimated ejection fraction was 55%. Wall motion was  normal; there were no regional wall motion abnormalities. Normal  sinus rhythm was absent. The study is not technically sufficient  to allow evaluation of LV diastolic function. - Aortic valve: Moderately calcified annulus. Trileaflet; mildly  thickened, mildly calcified leaflets. - Mitral valve: There was mild regurgitation. - Left atrium: The atrium was moderately dilated. - Pulmonary arteries: PA peak pressure: 32 mm Hg (S).    Assessment / Plan:  1. CAD with angina/Chest pain:she has had recent cath with very reassuring findings. Her symptoms however persist. Will arrange for CXR, PFTs and refer to Dr. Lake Bells with pulmonary.   2.Persistent AF - managed with rate control and anticoagulation. Lab today.No bleeding issues noted. Her rate is currently controlled.   3. HLD: intolerant to statins. She has never had ideal lipids. Continue Fenofibrate.Labs today.May still need to consider PCSK9 therapy - will see how her other testing turns out.   4. HTN:BP ok. I have left her on her current regimen for now.  5. Chronic anticoagulation - back on her Xarelto post cath - no problems and no otherwise missed doses.   6. Prior stroke- minimal residual deficit.  7. Situational stress/grief - slowly adjusting. Her 2nd husband recently passed - they were only married for a short time. Her brother passed as well in the fall while undergoing cardiac cath. I do not get the sense at all that her current symptoms are related to this.   8. Carotid disease- her last study from January showed 1 to 39% bilateral disease - will plan to repeat in January of 2021.    Current medicines are reviewed with the patient today.  The patient does not have concerns regarding medicines other than what has been noted above.  The following changes have been made:  See  above.  Labs/ tests ordered today  include:    Orders Placed This Encounter  Procedures  . DG Chest 2 View  . Ambulatory referral to Pulmonology  . Pulmonary function test     Disposition:   FU with me in a few months.    Patient is agreeable to this plan and will call if any problems develop in the interim.   SignedTruitt Merle, NP  04/11/2018 12:14 PM  [facility] [address] [place], [place]  [address] Phone: [phone] Fax: [phone]

## 2018-04-11 NOTE — Patient Instructions (Addendum)
We will be checking the following labs today - NONE   Medication Instructions:    Continue with your current medicines.     Testing/Procedures To Be Arranged:  PFTs with diffusion  Please go to [facility] to [place] on the first floor for a chest Xray - you may walk in.    Follow-Up:   See me in 3 to 4 months  Referral to pulmonary - Dr. Lake Bells    Other Special Instructions:   N/A    If you need a refill on your cardiac medications before your next appointment, please call your pharmacy.   Call the [place] office at [phone] if you have any questions, problems or concerns.

## 2018-04-21 ENCOUNTER — Ambulatory Visit (HOSPITAL_COMMUNITY)
Admission: RE | Admit: 2018-04-21 | Discharge: 2018-04-21 | Disposition: A | Discharge status: Home / Self Care | Payer: Medicare Other | Source: Ambulatory Visit | Attending: Nurse Practitioner | Admitting: Nurse Practitioner

## 2018-04-21 DIAGNOSIS — J449 Chronic obstructive pulmonary disease, unspecified: Secondary | ICD-10-CM | POA: Insufficient documentation

## 2018-04-21 DIAGNOSIS — R942 Abnormal results of pulmonary function studies: Secondary | ICD-10-CM | POA: Diagnosis not present

## 2018-04-21 DIAGNOSIS — R0602 Shortness of breath: Secondary | ICD-10-CM | POA: Diagnosis present

## 2018-04-21 DIAGNOSIS — Z87891 Personal history of nicotine dependence: Secondary | ICD-10-CM | POA: Insufficient documentation

## 2018-04-21 LAB — PULMONARY FUNCTION TEST
DL/VA % pred: 57 %
DL/VA: 2.92 ml/min/mmHg/L
DLCO unc % pred: 52 %
DLCO unc: 14.26 ml/min/mmHg
FEF 25-75 Post: 2.29 L/sec
FEF 25-75 Pre: 1.55 L/sec
FEF2575-%Change-Post: 47 %
FEF2575-%Pred-Post: 123 %
FEF2575-%Pred-Pre: 83 %
FEV1-%Change-Post: 7 %
FEV1-%Pred-Post: 103 %
FEV1-%Pred-Pre: 96 %
FEV1-Post: 2.44 L
FEV1-Pre: 2.26 L
FEV1FVC-%Change-Post: 5 %
FEV1FVC-%Pred-Pre: 97 %
FEV6-%Change-Post: 6 %
FEV6-%Pred-Post: 105 %
FEV6-%Pred-Pre: 99 %
FEV6-Post: 3.15 L
FEV6-Pre: 2.97 L
FEV6FVC-%Change-Post: 3 %
FEV6FVC-%Pred-Post: 102 %
FEV6FVC-%Pred-Pre: 99 %
FVC-%Change-Post: 2 %
FVC-%Pred-Post: 102 %
FVC-%Pred-Pre: 99 %
FVC-Post: 3.18 L
FVC-Pre: 3.1 L
Post FEV1/FVC ratio: 77 %
Post FEV6/FVC ratio: 99 %
Pre FEV1/FVC ratio: 73 %
Pre FEV6/FVC Ratio: 96 %
RV % pred: 118 %
RV: 2.8 L
TLC % pred: 113 %
TLC: 6.08 L

## 2018-04-21 MED ORDER — ALBUTEROL SULFATE (2.5 MG/3ML) 0.083% IN NEBU
2.5000 mg | INHALATION_SOLUTION | Freq: Once | RESPIRATORY_TRACT | Status: AC
  Administered 2018-04-21: 2.5 mg via RESPIRATORY_TRACT

## 2018-05-22 ENCOUNTER — Encounter (HOSPITAL_COMMUNITY): Payer: Self-pay | Admitting: Emergency Medicine

## 2018-05-22 ENCOUNTER — Telehealth: Payer: Self-pay | Admitting: Nurse Practitioner

## 2018-05-22 ENCOUNTER — Observation Stay (HOSPITAL_COMMUNITY)
Admission: EM | Admit: 2018-05-22 | Discharge: 2018-05-25 | Disposition: A | Discharge status: Home / Self Care | Payer: Medicare Other | Attending: Internal Medicine | Admitting: Internal Medicine

## 2018-05-22 ENCOUNTER — Other Ambulatory Visit: Payer: Self-pay

## 2018-05-22 DIAGNOSIS — Z79899 Other long term (current) drug therapy: Secondary | ICD-10-CM | POA: Diagnosis not present

## 2018-05-22 DIAGNOSIS — E7800 Pure hypercholesterolemia, unspecified: Secondary | ICD-10-CM | POA: Diagnosis not present

## 2018-05-22 DIAGNOSIS — I10 Essential (primary) hypertension: Secondary | ICD-10-CM | POA: Insufficient documentation

## 2018-05-22 DIAGNOSIS — E785 Hyperlipidemia, unspecified: Secondary | ICD-10-CM | POA: Insufficient documentation

## 2018-05-22 DIAGNOSIS — Z87891 Personal history of nicotine dependence: Secondary | ICD-10-CM | POA: Insufficient documentation

## 2018-05-22 DIAGNOSIS — R7989 Other specified abnormal findings of blood chemistry: Secondary | ICD-10-CM | POA: Diagnosis not present

## 2018-05-22 DIAGNOSIS — E669 Obesity, unspecified: Secondary | ICD-10-CM | POA: Diagnosis not present

## 2018-05-22 DIAGNOSIS — Z6828 Body mass index (BMI) 28.0-28.9, adult: Secondary | ICD-10-CM | POA: Insufficient documentation

## 2018-05-22 DIAGNOSIS — R740 Nonspecific elevation of levels of transaminase and lactic acid dehydrogenase [LDH]: Secondary | ICD-10-CM

## 2018-05-22 DIAGNOSIS — Z7901 Long term (current) use of anticoagulants: Secondary | ICD-10-CM | POA: Insufficient documentation

## 2018-05-22 DIAGNOSIS — D7589 Other specified diseases of blood and blood-forming organs: Secondary | ICD-10-CM | POA: Diagnosis not present

## 2018-05-22 DIAGNOSIS — R55 Syncope and collapse: Secondary | ICD-10-CM | POA: Diagnosis present

## 2018-05-22 DIAGNOSIS — K859 Acute pancreatitis without necrosis or infection, unspecified: Secondary | ICD-10-CM

## 2018-05-22 DIAGNOSIS — Z8673 Personal history of transient ischemic attack (TIA), and cerebral infarction without residual deficits: Secondary | ICD-10-CM | POA: Diagnosis not present

## 2018-05-22 DIAGNOSIS — D649 Anemia, unspecified: Secondary | ICD-10-CM | POA: Diagnosis not present

## 2018-05-22 DIAGNOSIS — I25110 Atherosclerotic heart disease of native coronary artery with unstable angina pectoris: Secondary | ICD-10-CM | POA: Insufficient documentation

## 2018-05-22 DIAGNOSIS — N289 Disorder of kidney and ureter, unspecified: Secondary | ICD-10-CM

## 2018-05-22 DIAGNOSIS — I480 Paroxysmal atrial fibrillation: Secondary | ICD-10-CM | POA: Diagnosis not present

## 2018-05-22 DIAGNOSIS — R7402 Elevation of levels of lactic acid dehydrogenase (LDH): Secondary | ICD-10-CM

## 2018-05-22 DIAGNOSIS — I4891 Unspecified atrial fibrillation: Secondary | ICD-10-CM | POA: Diagnosis present

## 2018-05-22 DIAGNOSIS — R531 Weakness: Secondary | ICD-10-CM | POA: Diagnosis present

## 2018-05-22 LAB — BASIC METABOLIC PANEL
ANION GAP: 11 (ref 5–15)
BUN: 20 mg/dL (ref 8–23)
CALCIUM: 9.1 mg/dL (ref 8.9–10.3)
CO2: 25 mmol/L (ref 22–32)
Chloride: 101 mmol/L (ref 98–111)
Creatinine, Ser: 1.02 mg/dL — ABNORMAL HIGH (ref 0.44–1.00)
GFR, EST NON AFRICAN AMERICAN: 53 mL/min — AB (ref >60)
Glucose, Bld: 116 mg/dL — ABNORMAL HIGH (ref 70–99)
Potassium: 4.1 mmol/L (ref 3.5–5.1)
SODIUM: 137 mmol/L (ref 135–145)

## 2018-05-22 LAB — CBC
HCT: 23.4 % — ABNORMAL LOW (ref 36.0–46.0)
HEMATOCRIT: 19.4 % — AB (ref 36.0–46.0)
HEMOGLOBIN: 6.5 g/dL — AB (ref 12.0–15.0)
Hemoglobin: 7.6 g/dL — ABNORMAL LOW (ref 12.0–15.0)
MCH: 32.2 pg (ref 26.0–34.0)
MCH: 32.3 pg (ref 26.0–34.0)
MCHC: 32.5 g/dL (ref 30.0–36.0)
MCHC: 33.5 g/dL (ref 30.0–36.0)
MCV: 99.2 fL (ref 78.0–100.0)
MCV: 96.5 fL (ref 78.0–100.0)
Platelets: 685 10*3/uL — ABNORMAL HIGH (ref 150–400)
Platelets: 546 10*3/uL — ABNORMAL HIGH (ref 150–400)
RBC: 2.36 MIL/uL — AB (ref 3.87–5.11)
RBC: 2.01 MIL/uL — AB (ref 3.87–5.11)
RDW: 15.2 % (ref 11.5–15.5)
RDW: 15.1 % (ref 11.5–15.5)
WBC: 8.5 10*3/uL (ref 4.0–10.5)
WBC: 7.2 10*3/uL (ref 4.0–10.5)

## 2018-05-22 LAB — HEPATIC FUNCTION PANEL
ALBUMIN: 3.4 g/dL — AB (ref 3.5–5.0)
ALK PHOS: 60 U/L (ref 38–126)
ALT: 31 U/L (ref 0–44)
AST: 26 U/L (ref 15–41)
BILIRUBIN INDIRECT: 1.4 mg/dL — AB (ref 0.3–0.9)
Bilirubin, Direct: 0.3 mg/dL — ABNORMAL HIGH (ref 0.0–0.2)
TOTAL PROTEIN: 6.8 g/dL (ref 6.5–8.1)
Total Bilirubin: 1.7 mg/dL — ABNORMAL HIGH (ref 0.3–1.2)

## 2018-05-22 LAB — RETICULOCYTES
RBC.: 2.28 MIL/uL — AB (ref 3.87–5.11)
RETIC COUNT ABSOLUTE: 68.4 10*3/uL (ref 19.0–186.0)
RETIC CT PCT: 3 % (ref 0.4–3.1)

## 2018-05-22 LAB — TSH: TSH: 1.626 u[IU]/mL (ref 0.350–4.500)

## 2018-05-22 LAB — IRON AND TIBC
Iron: 94 ug/dL (ref 28–170)
Saturation Ratios: 30 % (ref 10.4–31.8)
TIBC: 316 ug/dL (ref 250–450)
UIBC: 222 ug/dL

## 2018-05-22 LAB — POC OCCULT BLOOD, ED: FECAL OCCULT BLD: NEGATIVE

## 2018-05-22 LAB — SAVE SMEAR

## 2018-05-22 LAB — LACTATE DEHYDROGENASE: LDH: 517 U/L — AB (ref 98–192)

## 2018-05-22 LAB — FOLATE: FOLATE: 13.3 ng/mL (ref >5.9)

## 2018-05-22 LAB — FERRITIN: Ferritin: 845 ng/mL — ABNORMAL HIGH (ref 11–307)

## 2018-05-22 LAB — PREPARE RBC (CROSSMATCH)

## 2018-05-22 LAB — VITAMIN B12: VITAMIN B 12: 373 pg/mL (ref 180–914)

## 2018-05-22 MED ORDER — ONDANSETRON HCL 4 MG PO TABS: 4.0000 mg | ORAL_TABLET | Freq: Four times a day (QID) | ORAL | Status: DC | PRN

## 2018-05-22 MED ORDER — SODIUM CHLORIDE 0.9% FLUSH
3.0000 mL | Freq: Two times a day (BID) | INTRAVENOUS | Status: DC
  Administered 2018-05-22 – 2018-05-25 (×6): 3 mL via INTRAVENOUS

## 2018-05-22 MED ORDER — ONDANSETRON HCL 4 MG/2ML IJ SOLN: 4.0000 mg | Freq: Four times a day (QID) | INTRAMUSCULAR | Status: DC | PRN

## 2018-05-22 MED ORDER — FENOFIBRATE 160 MG PO TABS
160.0000 mg | ORAL_TABLET | Freq: Every day | ORAL | Status: DC
  Administered 2018-05-23 – 2018-05-25 (×3): 160 mg via ORAL
  Filled 2018-05-22 (×3): qty 1

## 2018-05-22 MED ORDER — SODIUM CHLORIDE 0.9% IV SOLUTION: Freq: Once | INTRAVENOUS | Status: DC

## 2018-05-22 MED ORDER — METOPROLOL TARTRATE 25 MG PO TABS
25.0000 mg | ORAL_TABLET | Freq: Two times a day (BID) | ORAL | Status: DC
  Administered 2018-05-23 (×2): 25 mg via ORAL
  Filled 2018-05-22 (×4): qty 1

## 2018-05-22 MED ORDER — ACETAMINOPHEN 325 MG PO TABS: 650.0000 mg | ORAL_TABLET | Freq: Four times a day (QID) | ORAL | Status: DC | PRN

## 2018-05-22 MED ORDER — DOCUSATE SODIUM 100 MG PO CAPS
100.0000 mg | ORAL_CAPSULE | Freq: Two times a day (BID) | ORAL | Status: DC
  Administered 2018-05-23 – 2018-05-25 (×5): 100 mg via ORAL
  Filled 2018-05-22 (×5): qty 1

## 2018-05-22 MED ORDER — ACETAMINOPHEN 650 MG RE SUPP: 650.0000 mg | Freq: Four times a day (QID) | RECTAL | Status: DC | PRN

## 2018-05-22 MED ORDER — SODIUM CHLORIDE 0.9 % IV BOLUS
1000.0000 mL | Freq: Once | INTRAVENOUS | Status: AC
  Administered 2018-05-22: 1000 mL via INTRAVENOUS

## 2018-05-22 NOTE — H&P (Signed)
History and Physical    Alexandra Pierce DXA:[id number] DOB: 1946/02/03 DOA: 05/22/2018  PCP: Chesley Noon, MD Consultants:  [facility] - cardiology; GYN Patient coming from:  Home - lives alone; NOK: Daughter, Lynann Beaver, [phone]  Chief Complaint:  Weakness, light-headedness  HPI: Alexandra Pierce is a 72 y.o. female with medical history significant of CVA x 2; afib on AC; obesity; HTN; and HLD presenting with weakness.  She thought she had a TIA.  She is very weak.  She had symptoms lasted Monday evening - she blacked out and fell.  She was standing in front of her sink and passed out.  She wears a contact on the right and she thought it was messed up - it was getting worse and worse and she tried to take out her contact.  The right eye was very blurry and then she felt like there was a bolt of light from that eye.  She doesn't remember falling but she wound up on the bathroom floor without injury.  She may have hit her head on the tile floor but she did not have an injury to it.  Possible LOC for seconds only.  She was a little disoriented and so laid still for a few more seconds and then realized she was okay.  Since everything appeared to be in order, she decided not to come to the ER.  Tuesday AM, she did not feel good.  She felt generally weak and she stayed home from work all week.  She slept a lot.  Just walking short distances made her feel exhausted.  Her legs would feel heavy and she would have to sit.  Her daughters insisted that she come in today.  The symptoms did not get worse.  She went to work this AM but then decided to come to the ER.  She has had SOB for a few months - had a breathing test with cardiologist and she has an appointment with pulm at the end of the month.  She can't walk distances like she used to because of the SOB.    She has not noticed bleeding.  She was on Coumadin from 2000 but changed to Xarelto several years ago.  +night sweats, unchanged.   ED Course:  No h/o  anemia, no black stools.  Heme negative.  Hgb 7.6 - symptomatic anemia.  Ordered 1 unit PRBC.  Uncertain etiology other than Xarelto.  Review of Systems: As per HPI; otherwise review of systems reviewed and negative.   Ambulatory Status:  Ambulates without assistance  Past Medical History:  Diagnosis Date  . Chronic lower back pain   . Hypercholesteremia   . Hypertension    mild  . Obesity   . Paroxysmal atrial fibrillation (HCC)    managed with anticoagulation and rate control.   . Stroke [facility]) 2001 & 2015   "right side gets weak sometimes if I'm only tired" (03/16/2016)    Past Surgical History:  Procedure Laterality Date  . CARDIAC CATHETERIZATION  03/31/2000   EF 49%/LAD lg vessel which coursed to the apex & gave rise to 2 diagonal branches/ LAD noted to have 30% ostial lesion & tapers to a sm vessel toward the apex but no high grade lesion/1st diagonal med sized vessel & 2nd diagonal sm vessel with no significant disease/ lt ventriculogram reveals mild global hypokinesis w/ an estimated EF of 45-50%  . CARDIAC CATHETERIZATION N/A 03/17/2016   Procedure: Left Heart Cath and Coronary Angiography;  Surgeon:  Wellington Hampshire, MD;  Location: [place] [facility];  Service: Cardiovascular;  Laterality: N/A;  . LEFT HEART CATH AND CORONARY ANGIOGRAPHY N/A 03/31/2018   Procedure: LEFT HEART CATH AND CORONARY ANGIOGRAPHY;  Surgeon: Martinique, Peter M, MD;  Location: [place] [facility];  Service: Cardiovascular;  Laterality: N/A;    Social History   Socioeconomic History  . Marital status: Married    Spouse name: Not on file  . Number of children: 2  . Years of education: Not on file  . Highest education level: Not on file  Occupational History  . Occupation: Herbalist  Social Needs  . Financial resource strain: Not on file  . Food insecurity:    Worry: Not on file    Inability: Not on file  . Transportation needs:    Medical: Not on file    Non-medical: Not on file  Tobacco  Use  . Smoking status: Former Smoker    Packs/day: 0.12    Years: 15.00    Pack years: 1.80    Types: Cigarettes    Last attempt to quit: 10/18/1978    Years since quitting: 39.6  . Smokeless tobacco: Never Used  Substance and Sexual Activity  . Alcohol use: Yes    Alcohol/week: 8.4 oz    Types: 14 Glasses of wine per week  . Drug use: No  . Sexual activity: Yes  Lifestyle  . Physical activity:    Days per week: Not on file    Minutes per session: Not on file  . Stress: Not on file  Relationships  . Social connections:    Talks on phone: Not on file    Gets together: Not on file    Attends religious service: Not on file    Active member of club or organization: Not on file    Attends meetings of clubs or organizations: Not on file    Relationship status: Not on file  . Intimate partner violence:    Fear of current or ex partner: Not on file    Emotionally abused: Not on file    Physically abused: Not on file    Forced sexual activity: Not on file  Other Topics Concern  . Not on file  Social History Narrative  . Not on file    Allergies  Allergen Reactions  . Sulfonamide Derivatives Anaphylaxis  . Lipitor [Atorvastatin] Other (See Comments)    Caused bladder infection  . Pravachol [Pravastatin Sodium] Other (See Comments)    Myalgias  . Statins Other (See Comments)    Myalgia    Family History  Problem Relation Age of Onset  . Heart disease Father   . Asthma Father   . Diabetes Father   . Heart disease Mother   . Heart disease Brother   . Hyperlipidemia Sister   . Hyperlipidemia Brother   . Hyperlipidemia Brother   . Cancer Neg Hx     Prior to Admission medications   Medication Sig Start Date End Date Taking? Authorizing Provider  acetaminophen (TYLENOL) 500 MG tablet Take 1,000 mg by mouth as needed for mild pain or headache.    Yes [provider]  cholecalciferol (VITAMIN D) 1000 UNITS tablet Take 1,000 Units by mouth daily.   Yes [provider]  fenofibrate 160 MG tablet Take 1 tablet (160 mg total) by mouth daily. 10/03/17  Yes Burtis Junes, NP  metoprolol tartrate (LOPRESSOR) 25 MG tablet Take 1 tablet (25 mg total) by mouth  2 (two) times daily. 10/03/17  Yes Burtis Junes, NP  rivaroxaban (XARELTO) 20 MG TABS tablet Take 1 tablet (20 mg total) by mouth daily. 04/01/18  Yes Martinique, Peter M, MD    Physical Exam: Vitals:   05/22/18 1445 05/22/18 1500 05/22/18 1545 05/22/18 1600  BP: 111/63 109/69  112/68  Pulse: 93 89 91 96  Resp: 15 (!) _0 Temp:      TempSrc:      SpO2: 100% 99% 97% 98%  Weight:      Height:         General:  Appears calm and comfortable and is NAD Eyes:  PERRL, EOMI, normal lids, iris ENT:  grossly normal hearing, lips & tongue, mmm; appropriate dentition Neck:  no LAD, masses or thyromegaly; no carotid bruits Cardiovascular:  RRR, no m/r/g. No LE edema.  Respiratory:   CTA bilaterally with no wheezes/rales/rhonchi.  Normal respiratory effort. Abdomen:  soft, NT, ND, NABS Back:  normal alignment, no CVAT Skin:  no rash or induration seen on limited exam Musculoskeletal:  grossly normal tone BUE/BLE, good ROM, no bony abnormality Lower extremity:  No LE edema.  Limited foot exam with no ulcerations.  2+ distal pulses. Psychiatric:  grossly normal mood and affect, speech fluent and appropriate, AOx3 Neurologic:  CN 2-12 grossly intact, moves all extremities in coordinated fashion, sensation intact    Radiological Exams on Admission: No results found.  EKG: Independently reviewed.  Afib with rate 103;  no evidence of acute ischemia   Labs on Admission: I have personally reviewed the available labs and imaging studies at the time of the admission.  Pertinent labs:   Glucose 116 BUN 20/Creatinine 1.02/GFR 53 WBC 8.5 Hgb 7.6 MCV 99.2, normal RDW Plt 685 Heme negative  Assessment/Plan Principal Problem:   Symptomatic anemia Active Problems:   Hyperlipidemia    Essential hypertension   Atrial fibrillation (HCC)   Syncope   Symptomatic anemia -Patient without h/o anemia or similar episode  -She was admitted for cath in 6/19 due to progressive DOE; cath was reassuring and she was referred to pulm and scheduled to have PFTs and CXR (negative) -She presents today after an episode of possible syncope with what was concerning for retinal detachment 1 week ago -Since that episode, all of those symptoms have not recurred but she has had ongoing weakness and fatigue -Today, she is found to have Hgb 7.6, prior 11.4 on 6/10, 12.3 on 10/03/17, and 13.9 on 04/13/17 -MCV is normal, RDW is normal -This appears to be anemia of chronic disease, since she does not have any apparent bleeding -She also has thrombocytosis -Hematology consult requested and they will see her in the AM; of note, her husband of only 9 months died of leukemia fairly recently and so she is quite sensitive to the idea of cancer. -IR performs bone marrow biopsies at [facility] and so will place a consult for this -Anemia panel ordered -Will also check LDH, LFTs, and other labs as per Dr. Irene Limbo -Will add TSH testing -Will observe for now on telemetry -Transfuse 2 units PRBC to start and recheck Hgb afterwards.  -Patient counseled about short- and long-term risks associated with transfusion and consents to receive blood products.  Syncope -While her symptoms 1 week ago may suggest other causes, given the very transient nature of the symptoms in conjunction with the persistent fatigue and anemia found on evaluation today - this appears to be related to symptomatic anemia -Will observe on  telemetry overnight -No further evaluation appears to be indicated for this issue unless now problems arise  HTN -Continue Lopressor  Afib on AC -Rate controlled on lopressor -Hold Xarelto for bone marrow biopsy  HLD -Intolerant to statins -Continue fenofibrate    DVT prophylaxis:  SCDs Code Status:  Full  - confirmed with patient Family Communication: None present Disposition Plan:  Home once clinically improved Consults called: Hematology  Admission status: It is my clinical opinion that referral for OBSERVATION is reasonable and necessary in this patient based on the above information provided. The aforementioned taken together are felt to place the patient at high risk for further clinical deterioration. However it is anticipated that the patient may be medically stable for discharge from the hospital within 24 to 48 hours.    Karmen Bongo MD [facility]  If note is complete, please contact covering daytime or nighttime physician. [email or web address] Password [facility]  05/22/2018, 4:24 PM

## 2018-05-22 NOTE — Telephone Encounter (Signed)
New Message:      Patient states she is needing and appt due to her having a TIA last Monday.

## 2018-05-22 NOTE — Telephone Encounter (Signed)
New Message:      FYI:   Patient's daughter is calling and would like for Servando Snare to know that the patient has been admitted into the hospital

## 2018-05-22 NOTE — ED Triage Notes (Signed)
Pt. Stated, since last Monday when I think I had a TIA. Since that time I feel weak and my legs feel heavy and like they are going to go to sleep.

## 2018-05-22 NOTE — ED Provider Notes (Signed)
[facility] EMERGENCY DEPARTMENT Provider Note   CSN: [id number] Arrival date & time: 05/22/18  1129     History   Chief Complaint Chief Complaint  Patient presents with  . Weakness    HPI Alexandra Pierce is a 72 y.o. female.  Patient feels weak times one week.  No history of black stool.  Past medical history includes CVA in 2000 and 2015.  She recently had a syncopal spell where she fell to the floor, but spontaneously recovered.  Past medical history includes hypertension, but no diabetes or coronary artery disease.  She is on Xarelto secondary to a diagnosis of atrial fibrillation  She does not take NSAIDs.  Severity of symptoms is moderate.  Nothing makes symptoms better or worse.     Past Medical History:  Diagnosis Date  . Chronic lower back pain   . Hypercholesteremia   . Hypertension    mild  . Obesity   . Paroxysmal atrial fibrillation (HCC)    managed with anticoagulation and rate control.   . Stroke [facility]) 2001 & 2015   "right side gets weak sometimes if I'm only tired" (03/16/2016)    Patient Active Problem List   Diagnosis Date Noted  . Unstable angina ([place])   . Chest pain 03/16/2016  . ARF (acute renal failure) ([place]) 03/16/2016  . Coronary artery disease involving native coronary artery of native heart with unstable angina pectoris ([place])   . Acute CVA (cerebrovascular accident) ([place]) 01/12/2014  . TIA (transient ischemic attack) 01/11/2014  . COLONIC POLYPS 12/15/2007  . DYSLIPIDEMIA 12/15/2007  . INTERNAL HEMORRHOIDS WITHOUT MENTION COMP 12/15/2007  . SINUSITIS, CHRONIC 12/15/2007  . CONSTIPATION 12/15/2007  . RECTAL BLEEDING 12/15/2007  . OSTEOARTHRITIS 12/15/2007  . Essential hypertension 07/18/2007  . Atrial fibrillation ([place]) 07/18/2007  . CEREBROVASCULAR ACCIDENT, HX OF 07/18/2007    Past Surgical History:  Procedure Laterality Date  . CARDIAC CATHETERIZATION  03/31/2000   EF 49%/LAD lg vessel which coursed to the apex &  gave rise to 2 diagonal branches/ LAD noted to have 30% ostial lesion & tapers to a sm vessel toward the apex but no high grade lesion/1st diagonal med sized vessel & 2nd diagonal sm vessel with no significant disease/ lt ventriculogram reveals mild global hypokinesis w/ an estimated EF of 45-50%  . CARDIAC CATHETERIZATION N/A 03/17/2016   Procedure: Left Heart Cath and Coronary Angiography;  Surgeon: Wellington Hampshire, MD;  Location: [place] [facility];  Service: Cardiovascular;  Laterality: N/A;  . LEFT HEART CATH AND CORONARY ANGIOGRAPHY N/A 03/31/2018   Procedure: LEFT HEART CATH AND CORONARY ANGIOGRAPHY;  Surgeon: Martinique, Peter M, MD;  Location: [place] [facility];  Service: Cardiovascular;  Laterality: N/A;     OB History   None      Home Medications    Prior to Admission medications   Medication Sig Start Date End Date Taking? Authorizing Provider  acetaminophen (TYLENOL) 500 MG tablet Take 1,000 mg by mouth as needed for mild pain or headache.    Yes [provider]  cholecalciferol (VITAMIN D) 1000 UNITS tablet Take 1,000 Units by mouth daily.   Yes [provider]  fenofibrate 160 MG tablet Take 1 tablet (160 mg total) by mouth daily. 10/03/17  Yes Burtis Junes, NP  metoprolol tartrate (LOPRESSOR) 25 MG tablet Take 1 tablet (25 mg total) by mouth 2 (two) times daily. 10/03/17  Yes Burtis Junes, NP  rivaroxaban (XARELTO) 20 MG TABS tablet Take  1 tablet (20 mg total) by mouth daily. 04/01/18  Yes Martinique, Peter M, MD    Family History Family History  Problem Relation Age of Onset  . Heart disease Father   . Asthma Father   . Diabetes Father   . Heart disease Mother   . Heart disease Brother   . Hyperlipidemia Sister   . Hyperlipidemia Brother   . Hyperlipidemia Brother     Social History Social History   Tobacco Use  . Smoking status: Former Smoker    Packs/day: 0.12    Years: 15.00    Pack years: 1.80    Types: Cigarettes    Last attempt to  quit: 10/18/1978    Years since quitting: 39.6  . Smokeless tobacco: Never Used  Substance Use Topics  . Alcohol use: Yes    Alcohol/week: 8.4 oz    Types: 14 Glasses of wine per week  . Drug use: No     Allergies   Sulfonamide derivatives; Lipitor [atorvastatin]; Pravachol [pravastatin sodium]; and Statins   Review of Systems Review of Systems  All other systems reviewed and are negative.    Physical Exam Updated Vital Signs BP 109/69   Pulse 89   Temp 99.2 F (37.3 C) (Oral)   Resp (!) 21   Ht 5\' 6"  (1.676 m)   Wt 80.7 kg (178 lb)   SpO2 99%   BMI 28.73 kg/m   Physical Exam  Constitutional: She is oriented to person, place, and time. She appears well-developed and well-nourished.  HENT:  Head: Normocephalic and atraumatic.  Eyes: Conjunctivae are normal.  Neck: Neck supple.  Cardiovascular:  Irregularly irregular  Pulmonary/Chest: Effort normal and breath sounds normal.  Abdominal: Soft. Bowel sounds are normal.  Genitourinary:  Genitourinary Comments: Rectal exam: No masses.  No black stool.  Heme negative.  Musculoskeletal: Normal range of motion.  Neurological: She is alert and oriented to person, place, and time.  Skin: Skin is warm and dry.  Psychiatric: She has a normal mood and affect. Her behavior is normal.  Nursing note and vitals reviewed.    ED Treatments / Results  Labs (all labs ordered are listed, but only abnormal results are displayed) Labs Reviewed  BASIC METABOLIC PANEL - Abnormal; Notable for the following components:      Result Value   Glucose, Bld 116 (*)    Creatinine, Ser 1.02 (*)    GFR calc non Af Amer 53 (*)    All other components within normal limits  CBC - Abnormal; Notable for the following components:   RBC 2.36 (*)    Hemoglobin 7.6 (*)    HCT 23.4 (*)    Platelets 685 (*)    All other components within normal limits  URINALYSIS, ROUTINE W REFLEX MICROSCOPIC  POC OCCULT BLOOD, ED  CBG MONITORING, ED  TYPE AND  SCREEN  PREPARE RBC (CROSSMATCH)    EKG EKG Interpretation  Date/Time:  Monday May 22 2018 11:58:51 EDT Ventricular Rate:  103 PR Interval:    QRS Duration: 80 QT Interval:  362 QTC Calculation: 474 R Axis:   65 Text Interpretation:  Atrial fibrillation with rapid ventricular response Nonspecific ST abnormality Abnormal ECG Confirmed by Nat Christen [phone]) on 05/22/2018 2:20:29 PM   Radiology No results found.  Procedures Procedures (including critical care time)  Medications Ordered in ED Medications  sodium chloride 0.9 % bolus 1,000 mL (1,000 mLs Intravenous New Bag/Given 05/22/18 1534)  0.9 %  sodium chloride infusion (Manually program  via [place]) (has no administration in time range)     Initial Impression / Assessment and Plan / ED Course  I have reviewed the triage vital signs and the nursing notes.  Pertinent labs & imaging results that were available during my care of the patient were reviewed by me and considered in my medical decision making (see chart for details).    Patient presents with weakness and a probable syncopal spell.  Hemoglobin 7.6 [11.4 on 03/27/18].  No evidence of a stroke or other neurological event.  Will transfuse 1 unit of packed cells.  Admit for symptomatic anemia.   CRITICAL CARE Performed by: Nat Christen Total critical care time: 30 minutes Critical care time was exclusive of separately billable procedures and treating other patients. Critical care was necessary to treat or prevent imminent or life-threatening deterioration. Critical care was time spent personally by me on the following activities: development of treatment plan with patient and/or surrogate as well as nursing, discussions with consultants, evaluation of patient's response to treatment, examination of patient, obtaining history from patient or surrogate, ordering and performing treatments and interventions, ordering and review of laboratory studies, ordering  and review of radiographic studies, pulse oximetry and re-evaluation of patient's condition.     Final Clinical Impressions(s) / ED Diagnoses   Final diagnoses:  Symptomatic anemia    ED Discharge Orders    None       Nat Christen, MD 05/22/18 1544

## 2018-05-23 ENCOUNTER — Observation Stay (HOSPITAL_COMMUNITY): Payer: Medicare Other

## 2018-05-23 ENCOUNTER — Other Ambulatory Visit: Payer: Self-pay

## 2018-05-23 DIAGNOSIS — Z8673 Personal history of transient ischemic attack (TIA), and cerebral infarction without residual deficits: Secondary | ICD-10-CM

## 2018-05-23 DIAGNOSIS — E785 Hyperlipidemia, unspecified: Secondary | ICD-10-CM

## 2018-05-23 DIAGNOSIS — Z888 Allergy status to other drugs, medicaments and biological substances status: Secondary | ICD-10-CM

## 2018-05-23 DIAGNOSIS — I4891 Unspecified atrial fibrillation: Secondary | ICD-10-CM

## 2018-05-23 DIAGNOSIS — Z87891 Personal history of nicotine dependence: Secondary | ICD-10-CM

## 2018-05-23 DIAGNOSIS — N289 Disorder of kidney and ureter, unspecified: Secondary | ICD-10-CM

## 2018-05-23 DIAGNOSIS — R740 Nonspecific elevation of levels of transaminase and lactic acid dehydrogenase [LDH]: Secondary | ICD-10-CM

## 2018-05-23 DIAGNOSIS — I10 Essential (primary) hypertension: Secondary | ICD-10-CM | POA: Diagnosis not present

## 2018-05-23 DIAGNOSIS — Z882 Allergy status to sulfonamides status: Secondary | ICD-10-CM

## 2018-05-23 DIAGNOSIS — Z7901 Long term (current) use of anticoagulants: Secondary | ICD-10-CM

## 2018-05-23 DIAGNOSIS — D649 Anemia, unspecified: Secondary | ICD-10-CM | POA: Diagnosis not present

## 2018-05-23 LAB — KAPPA/LAMBDA LIGHT CHAINS
KAPPA FREE LGHT CHN: 27 mg/L — AB (ref 3.3–19.4)
Kappa, lambda light chain ratio: 1.79 — ABNORMAL HIGH (ref 0.26–1.65)
LAMDA FREE LIGHT CHAINS: 15.1 mg/L (ref 5.7–26.3)

## 2018-05-23 LAB — BASIC METABOLIC PANEL
Anion gap: 9 (ref 5–15)
BUN: 16 mg/dL (ref 8–23)
CHLORIDE: 105 mmol/L (ref 98–111)
CO2: 27 mmol/L (ref 22–32)
Calcium: 8.6 mg/dL — ABNORMAL LOW (ref 8.9–10.3)
Creatinine, Ser: 0.94 mg/dL (ref 0.44–1.00)
GFR calc non Af Amer: 59 mL/min — ABNORMAL LOW (ref >60)
GLUCOSE: 126 mg/dL — AB (ref 70–99)
Potassium: 4.7 mmol/L (ref 3.5–5.1)
Sodium: 141 mmol/L (ref 135–145)

## 2018-05-23 LAB — URINALYSIS, ROUTINE W REFLEX MICROSCOPIC
BILIRUBIN URINE: NEGATIVE
GLUCOSE, UA: NEGATIVE mg/dL
KETONES UR: NEGATIVE mg/dL
NITRITE: NEGATIVE
Protein, ur: NEGATIVE mg/dL
SPECIFIC GRAVITY, URINE: 1.028 (ref 1.005–1.030)
pH: 7 (ref 5.0–8.0)

## 2018-05-23 LAB — CBC
HCT: 27.4 % — ABNORMAL LOW (ref 36.0–46.0)
Hemoglobin: 9 g/dL — ABNORMAL LOW (ref 12.0–15.0)
MCH: 30.8 pg (ref 26.0–34.0)
MCHC: 32.8 g/dL (ref 30.0–36.0)
MCV: 93.8 fL (ref 78.0–100.0)
Platelets: 573 10*3/uL — ABNORMAL HIGH (ref 150–400)
RBC: 2.92 MIL/uL — AB (ref 3.87–5.11)
RDW: 18 % — ABNORMAL HIGH (ref 11.5–15.5)
WBC: 7.3 10*3/uL (ref 4.0–10.5)

## 2018-05-23 LAB — MAGNESIUM: Magnesium: 2.2 mg/dL (ref 1.7–2.4)

## 2018-05-23 LAB — HAPTOGLOBIN: Haptoglobin: 45 mg/dL (ref 34–200)

## 2018-05-23 LAB — PROTIME-INR
INR: 1.29
Prothrombin Time: 15.9 seconds — ABNORMAL HIGH (ref 11.4–15.2)

## 2018-05-23 MED ORDER — IOHEXOL 300 MG/ML  SOLN
75.0000 mL | Freq: Once | INTRAMUSCULAR | Status: AC | PRN
  Administered 2018-05-23: 75 mL via INTRAVENOUS

## 2018-05-23 MED ORDER — IOPAMIDOL (ISOVUE-300) INJECTION 61%
INTRAVENOUS | Status: AC
  Filled 2018-05-23: qty 30

## 2018-05-23 NOTE — Progress Notes (Signed)
HEMATOLOGY/ONCOLOGY CONSULTATION NOTE  Date of Service: 05/23/2018  Patient Care Team: Chesley Noon, MD as PCP - General (Family Medicine)  CHIEF COMPLAINTS/PURPOSE OF CONSULTATION:  Symptomatic anemia   HISTORY OF PRESENTING ILLNESS:   Alexandra Pierce is a wonderful 72 y.o. female who has been referred to [place] by Dr. Eliseo Squires for evaluation and management of symptomatic anemia.   Patient has history of hypertension, dyslipidemia, atrial fibrillation on anticoagulation with previous history of CVA x2, obesity who presented with generalized weakness.  Prior to admission she had an episode of blurring of vision especially in the right eye associated with a spell of confusion and possible loss of consciousness for a few seconds associated with disorientation.  She notes that she has also been feeling progressively fatigued for a few weeks and has had difficulty with dyspnea on exertion with short distances. Her daughters insisted she get evaluated and so she came to the emergency room for further evaluation. She does follow-up with cardiology and is being evaluated by pulmonary at the end of the month as well.  Patient previously has been on Coumadin from 2000 but was switched to Xarelto several years ago.  She notes no overt evidence of black stools or bleeding in the stools, no hematuria no nosebleeds no gum bleeds. Notes that she has been generally eating okay. Does endorse new night sweats over the last month or 2. No abdominal pain or significant change in bowel habits.  In the emergency room the patient had labs which showed hemoglobin of 6.6.5 with an MCV of 96.5.  Normal WBC count of 7.2k with elevated platelets of 685k which on repeat were down to 573k. Reticulocyte counts were relatively suppressed. LDH was elevated to 517 but haptoglobin was within normal limits at 45. Coombs negative Myeloma panel showed no M spike Ferritin level was elevated to 845 suggestive of acute phase  reaction/inflammation. B12 and folate within normal limits.  Patient received 2 units of PRBCs with an appropriate bump in her hemoglobin level and felt much better. CT chest abdomen pelvis was done which showed mild splenomegaly without a focal splenic lesion.  Multiple hepatic cysts.  Abnormal hypoenhancement in the right kidney lower pole of undetermined etiology -broad differential based on radiology input. right perirenal stranding is associated with a small amount of fluid in the adjacent right paracolic gutter, and a small fluid collection along the inferior margin of the liver which could be an exophytic cyst, small complex fluid collection, or conceivably a tumor nodule.  No other significant other lymphadenopathy noted.  Patient has subsequently had a bone marrow examination with the results currently pending    MEDICAL HISTORY:  Past Medical History:  Diagnosis Date  . Chronic lower back pain   . Hypercholesteremia   . Hypertension    mild  . Obesity   . Paroxysmal atrial fibrillation (HCC)    managed with anticoagulation and rate control.   . Stroke [facility]) 2001 & 2015   "right side gets weak sometimes if I'm only tired" (03/16/2016)    SURGICAL HISTORY: Past Surgical History:  Procedure Laterality Date  . CARDIAC CATHETERIZATION  03/31/2000   EF 49%/LAD lg vessel which coursed to the apex & gave rise to 2 diagonal branches/ LAD noted to have 30% ostial lesion & tapers to a sm vessel toward the apex but no high grade lesion/1st diagonal med sized vessel & 2nd diagonal sm vessel with no significant disease/ lt ventriculogram reveals mild global hypokinesis w/  an estimated EF of 45-50%  . CARDIAC CATHETERIZATION N/A 03/17/2016   Procedure: Left Heart Cath and Coronary Angiography;  Surgeon: Wellington Hampshire, MD;  Location: [place] [facility];  Service: Cardiovascular;  Laterality: N/A;  . LEFT HEART CATH AND CORONARY ANGIOGRAPHY N/A 03/31/2018   Procedure: LEFT HEART CATH AND  CORONARY ANGIOGRAPHY;  Surgeon: Martinique, Peter M, MD;  Location: [place] [facility];  Service: Cardiovascular;  Laterality: N/A;    SOCIAL HISTORY: Social History   Socioeconomic History  . Marital status: Married    Spouse name: Not on file  . Number of children: 2  . Years of education: Not on file  . Highest education level: Not on file  Occupational History  . Occupation: Herbalist  Social Needs  . Financial resource strain: Not on file  . Food insecurity:    Worry: Not on file    Inability: Not on file  . Transportation needs:    Medical: Not on file    Non-medical: Not on file  Tobacco Use  . Smoking status: Former Smoker    Packs/day: 0.12    Years: 15.00    Pack years: 1.80    Types: Cigarettes    Last attempt to quit: 10/18/1978    Years since quitting: 39.6  . Smokeless tobacco: Never Used  Substance and Sexual Activity  . Alcohol use: Yes    Alcohol/week: 8.4 oz    Types: 14 Glasses of wine per week  . Drug use: No  . Sexual activity: Yes  Lifestyle  . Physical activity:    Days per week: Not on file    Minutes per session: Not on file  . Stress: Not on file  Relationships  . Social connections:    Talks on phone: Not on file    Gets together: Not on file    Attends religious service: Not on file    Active member of club or organization: Not on file    Attends meetings of clubs or organizations: Not on file    Relationship status: Not on file  . Intimate partner violence:    Fear of current or ex partner: Not on file    Emotionally abused: Not on file    Physically abused: Not on file    Forced sexual activity: Not on file  Other Topics Concern  . Not on file  Social History Narrative  . Not on file    FAMILY HISTORY: Family History  Problem Relation Age of Onset  . Heart disease Father   . Asthma Father   . Diabetes Father   . Heart disease Mother   . Heart disease Brother   . Hyperlipidemia Sister   . Hyperlipidemia Brother   .  Hyperlipidemia Brother   . Cancer Neg Hx     ALLERGIES:  is allergic to sulfonamide derivatives; lipitor [atorvastatin]; pravachol [pravastatin sodium]; and statins.  MEDICATIONS:  Current Facility-Administered Medications  Medication Dose Route Frequency Provider Last Rate Last Dose  . iopamidol (ISOVUE-300) 61 % injection           . 0.9 %  sodium chloride infusion (Manually program via Guardrails IV Fluids)   Intravenous Once Karmen Bongo, MD      . acetaminophen (TYLENOL) tablet 650 mg  650 mg Oral Q6H PRN Karmen Bongo, MD       Or  . acetaminophen (TYLENOL) suppository 650 mg  650 mg Rectal Q6H PRN Karmen Bongo, MD      .  docusate sodium (COLACE) capsule 100 mg  100 mg Oral BID Karmen Bongo, MD      . fenofibrate tablet 160 mg  160 mg Oral Daily Karmen Bongo, MD      . metoprolol tartrate (LOPRESSOR) tablet 25 mg  25 mg Oral BID Karmen Bongo, MD      . ondansetron [facility]) tablet 4 mg  4 mg Oral Q6H PRN Karmen Bongo, MD       Or  . ondansetron [facility]) injection 4 mg  4 mg Intravenous Q6H PRN Karmen Bongo, MD      . sodium chloride flush (NS) 0.9 % injection 3 mL  3 mL Intravenous Q12H Karmen Bongo, MD   3 mL at 05/22/18 2130    REVIEW OF SYSTEMS:    10 Point review of Systems was done is negative except as noted above.  PHYSICAL EXAMINATION: ECOG PERFORMANCE STATUS: 1 - Symptomatic but completely ambulatory  . Vitals:   05/23/18 0131 05/23/18 0320  BP: 113/69 110/76  Pulse: 89 78  Resp: 18 18  Temp: 98.3 F (36.8 C) 98.3 F (36.8 C)  SpO2: 96% 98%   Filed Weights   05/22/18 1158 05/22/18 1728  Weight: 178 lb (80.7 kg) 175 lb 9.6 oz (79.7 kg)   .Body mass index is 28.34 kg/m.  GENERAL:alert, in no acute distress and comfortable SKIN: no acute rashes, no significant lesions EYES: conjunctiva are pink and non-injected, sclera anicteric OROPHARYNX: MMM, no exudates, no oropharyngeal erythema or ulceration NECK: supple, no JVD LYMPH:   no palpable lymphadenopathy in the cervical, axillary or inguinal regions LUNGS: clear to auscultation b/l with normal respiratory effort HEART: irregular S1S2 ABDOMEN:  normoactive bowel sounds , non tender, not distended. Extremity: trace pedal edema PSYCH: alert & oriented x 3 with fluent speech NEURO: no focal motor/sensory deficits  LABORATORY DATA:  I have reviewed the data as listed  . CBC Latest Ref Rng & Units 05/23/2018 05/22/2018 05/22/2018  WBC 4.0 - 10.5 K/uL 7.3 7.2 8.5  Hemoglobin 12.0 - 15.0 g/dL 9.0(L) 6.5(LL) 7.6(L)  Hematocrit 36.0 - 46.0 % 27.4(L) 19.4(L) 23.4(L)  Platelets 150 - 400 K/uL 573(H) 546(H) 685(H)    . CMP Latest Ref Rng & Units 05/23/2018 05/22/2018 03/27/2018  Glucose 70 - 99 mg/dL 126(H) 116(H) 106(H)  BUN 8 - 23 mg/dL '16 20 16  ' Creatinine 0.44 - 1.00 mg/dL 0.94 1.02(H) 0.91  Sodium 135 - 145 mmol/L 141 137 140  Potassium 3.5 - 5.1 mmol/L 4.7 4.1 5.2  Chloride 98 - 111 mmol/L 105 101 105  CO2 22 - 32 mmol/L '27 25 24  ' Calcium 8.9 - 10.3 mg/dL 8.6(L) 9.1 8.8  Total Protein 6.5 - 8.1 g/dL - 6.8 6.9  Total Bilirubin 0.3 - 1.2 mg/dL - 1.7(H) 1.0  Alkaline Phos 38 - 126 U/L - 60 43  AST 15 - 41 U/L - 26 17  ALT 0 - 44 U/L - 31 12     RADIOGRAPHIC STUDIES: I have personally reviewed the radiological images as listed and agreed with the findings in the report. No results found.  ASSESSMENT & PLAN:   72 y.o. female with  1. S/p Symptomatic macrocytic anemia. No overt evidence of bleeding.  In the emergency room the patient had labs which showed hemoglobin of 6.6.5 with an MCV of 96.5.  Normal WBC count of 7.2k with elevated platelets of 685k which on repeat were down to 573k. Reticulocyte counts were relatively suppressed. LDH was elevated to 517 but haptoglobin  was within normal limits at 45. Coombs negative Myeloma panel showed no M spike Ferritin level was elevated to 845 suggestive of acute phase reaction/inflammation. B12 and folate within  normal limits.  Patient received 2 units of PRBCs with an appropriate bump in her hemoglobin level and felt much better. CT chest abdomen pelvis was done which showed mild splenomegaly without a focal splenic lesion.  Multiple hepatic cysts.  Abnormal hypoenhancement in the right kidney lower pole of undetermined etiology -broad differential based on radiology input. right perirenal stranding is associated with a small amount of fluid in the adjacent right paracolic gutter, and a small fluid collection along the inferior margin of the liver which could be an exophytic cyst, small complex fluid collection, or conceivably a tumor nodule.  No other significant other lymphadenopathy noted. PLAN -Hemoglobin has remained relatively stable posttransfusion. -Patient is status post bone marrow examination on 05/23/2018 pending biopsy results. -We will follow-up on biopsy results when available.  From anemia standpoint the patient could potentially be discharged and followed up with [place] in clinic to discuss bone marrow biopsy results.  2. Rt renal radiographic abnormality as noted in CT with some fluid collection. Unclear etiology Abdominal examination was completely benign. No fevers chills to suggest acute pyelonephritis or renal abscess. Urine analysis showed trace leuk esterase and 6-10 WBCs no significant hematuria. Broad differential diagnosis based on CT findings as per radiologist. Procalcitonin levels unimpressive Plan -Acute phase reactants suggest cannot rule out malignancy or renal infarct or an infectious process. -Will defer to hospitalist regarding need for antibiotics. -We will likely need urology evaluation inpatient versus outpatient.  Will defer to urology regarding need for additional imaging studies such as ultrasound of the kidneys or MRI kidneys to further elucidate the etiology of the renal findings.  We will follow-up with the patient with bone marrow results as inpatient if the  patient is still in the hospital. If discharge will set up the patient for early follow-up early next week. Patient can call the cancer center at [phone] if she is not called by the cancer center schedulers by Monday.  Case was discussed with Dr. Cheri Kearns medicine.  Please call if any other acute questions or concerns.  All of the patients questions were answered with apparent satisfaction. The patient knows to call the clinic with any problems, questions or concerns.  The total time spent in the appt was 80 minutes and more than 50% was on counseling and direct patient cares.     Sullivan Lone MD MS AAHIVMS [facility] Hematology/Oncology Physician [facility]  (Office):       [phone] (Work cell):  [phone] (Fax):           [phone]  05/23/2018 9:27 AM  I, Baldwin Jamaica, am acting as a scribe for Dr. Irene Limbo  .I have reviewed the above documentation for accuracy and completeness, and I agree with the above. [name] Kitchen

## 2018-05-23 NOTE — Progress Notes (Signed)
Pt given contrast and instructed to drink one bottle 07-1099 and the other 1100-1200. Pt verbalized understanding. Will continue to assess.

## 2018-05-23 NOTE — Consult Note (Signed)
Chief Complaint: Patient was seen in consultation today for  Bone marrow biopsy Chief Complaint  Patient presents with  . Weakness   at the request of Dr Thomasenia Bottoms   Supervising Physician: Marybelle Killings  Patient Status: [facility] - In-pt  History of Present Illness: Alexandra Pierce is a 72 y.o. female   Feeling weak for several days Syncopal spell at home days ago-- came to and felt fine As week went on she noted sob; weakness; fatigue Family made pt go to ED Does use Xarelto for Afib LD 8/5  Heme neg stools Hg 7.6- 6.5 (13.9- 12.3- 11.4 over last yr) SOB; fatigue  Request made for Bone marrow biopsy per [place] [facility] consult pending  Plan for BM bx 8/7-- will await Hematology note and recommendations Will arrange for Cytology    Past Medical History:  Diagnosis Date  . Chronic lower back pain   . Hypercholesteremia   . Hypertension    mild  . Obesity   . Paroxysmal atrial fibrillation (HCC)    managed with anticoagulation and rate control.   . Stroke [facility]) 2001 & 2015   "right side gets weak sometimes if I'm only tired" (03/16/2016)    Past Surgical History:  Procedure Laterality Date  . CARDIAC CATHETERIZATION  03/31/2000   EF 49%/LAD lg vessel which coursed to the apex & gave rise to 2 diagonal branches/ LAD noted to have 30% ostial lesion & tapers to a sm vessel toward the apex but no high grade lesion/1st diagonal med sized vessel & 2nd diagonal sm vessel with no significant disease/ lt ventriculogram reveals mild global hypokinesis w/ an estimated EF of 45-50%  . CARDIAC CATHETERIZATION N/A 03/17/2016   Procedure: Left Heart Cath and Coronary Angiography;  Surgeon: Wellington Hampshire, MD;  Location: [place] [facility];  Service: Cardiovascular;  Laterality: N/A;  . LEFT HEART CATH AND CORONARY ANGIOGRAPHY N/A 03/31/2018   Procedure: LEFT HEART CATH AND CORONARY ANGIOGRAPHY;  Surgeon: Martinique, Peter M, MD;  Location: [place] [facility];  Service: Cardiovascular;   Laterality: N/A;    Allergies: Sulfonamide derivatives; Lipitor [atorvastatin]; Pravachol [pravastatin sodium]; and Statins  Medications: Prior to Admission medications   Medication Sig Start Date End Date Taking? Authorizing Provider  acetaminophen (TYLENOL) 500 MG tablet Take 1,000 mg by mouth as needed for mild pain or headache.    Yes [provider]  cholecalciferol (VITAMIN D) 1000 UNITS tablet Take 1,000 Units by mouth daily.   Yes [provider]  fenofibrate 160 MG tablet Take 1 tablet (160 mg total) by mouth daily. 10/03/17  Yes Burtis Junes, NP  metoprolol tartrate (LOPRESSOR) 25 MG tablet Take 1 tablet (25 mg total) by mouth 2 (two) times daily. 10/03/17  Yes Burtis Junes, NP  rivaroxaban (XARELTO) 20 MG TABS tablet Take 1 tablet (20 mg total) by mouth daily. 04/01/18  Yes Martinique, Peter M, MD     Family History  Problem Relation Age of Onset  . Heart disease Father   . Asthma Father   . Diabetes Father   . Heart disease Mother   . Heart disease Brother   . Hyperlipidemia Sister   . Hyperlipidemia Brother   . Hyperlipidemia Brother   . Cancer Neg Hx     Social History   Socioeconomic History  . Marital status: Married    Spouse name: Not on file  . Number of children: 2  . Years of education: Not on file  .  Highest education level: Not on file  Occupational History  . Occupation: Herbalist  Social Needs  . Financial resource strain: Not on file  . Food insecurity:    Worry: Not on file    Inability: Not on file  . Transportation needs:    Medical: Not on file    Non-medical: Not on file  Tobacco Use  . Smoking status: Former Smoker    Packs/day: 0.12    Years: 15.00    Pack years: 1.80    Types: Cigarettes    Last attempt to quit: 10/18/1978    Years since quitting: 39.6  . Smokeless tobacco: Never Used  Substance and Sexual Activity  . Alcohol use: Yes    Alcohol/week: 8.4 oz    Types: 14 Glasses of wine per week  .  Drug use: No  . Sexual activity: Yes  Lifestyle  . Physical activity:    Days per week: Not on file    Minutes per session: Not on file  . Stress: Not on file  Relationships  . Social connections:    Talks on phone: Not on file    Gets together: Not on file    Attends religious service: Not on file    Active member of club or organization: Not on file    Attends meetings of clubs or organizations: Not on file    Relationship status: Not on file  Other Topics Concern  . Not on file  Social History Narrative  . Not on file    Review of Systems: A 12 point ROS discussed and pertinent positives are indicated in the HPI above.  All other systems are negative.  Review of Systems  Constitutional: Positive for activity change and fatigue. Negative for fever.  Respiratory: Positive for shortness of breath. Negative for cough.   Cardiovascular: Negative for chest pain.  Gastrointestinal: Negative for abdominal pain.  Musculoskeletal: Negative for back pain.  Neurological: Positive for weakness.  Psychiatric/Behavioral: Negative for behavioral problems and confusion.    Vital Signs: BP 110/76 (BP Location: Right Arm)   Pulse 78   Temp 98.3 F (36.8 C) (Oral)   Resp 18   Ht '5\' 6"'  (1.676 m)   Wt 175 lb 9.6 oz (79.7 kg)   SpO2 98%   BMI 28.34 kg/m   Physical Exam  Constitutional: She is oriented to person, place, and time.  Cardiovascular: Normal rate, regular rhythm and normal heart sounds.  Pulmonary/Chest: Effort normal and breath sounds normal.  Abdominal: Soft. Bowel sounds are normal.  Musculoskeletal: Normal range of motion.  Neurological: She is alert and oriented to person, place, and time.  Skin: Skin is warm and dry.  Psychiatric: She has a normal mood and affect. Her behavior is normal. Judgment and thought content normal.  Nursing note and vitals reviewed.   Imaging: No results found.  Labs:  CBC: Recent Labs    03/27/18 1012 05/22/18 1205  05/22/18 2151 05/23/18 0524  WBC 5.5 8.5 7.2 7.3  HGB 11.4 7.6* 6.5* 9.0*  HCT 33.2* 23.4* 19.4* 27.4*  PLT 417 685* 546* 573*    COAGS: Recent Labs    05/23/18 0655  INR 1.29    BMP: Recent Labs    10/03/17 1000 03/27/18 1012 05/22/18 1205 05/23/18 0524  NA 142 140 137 141  K 4.7 5.2 4.1 4.7  CL 104 105 101 105  CO2 '24 24 25 27  ' GLUCOSE 107* 106* 116* 126*  BUN '18 16 20 ' 16  CALCIUM 9.3 8.8 9.1 8.6*  CREATININE 0.81 0.91 1.02* 0.94  GFRNONAA 73 63 53* 59*  GFRAA 84 72 >60 >60    LIVER FUNCTION TESTS: Recent Labs    10/03/17 1000 03/27/18 1012 05/22/18 1646  BILITOT 0.9 1.0 1.7*  AST '19 17 26  ' ALT '13 12 31  ' ALKPHOS 58 43 60  PROT 6.8 6.9 6.8  ALBUMIN 4.3 4.5 3.4*    TUMOR MARKERS: No results for input(s): AFPTM, CEA, CA199, CHROMGRNA in the last 8760 hours.  Assessment and Plan:  SOB; fatigue Syncopal episode x1 Hg drop Heme neg stool Hematology to consult today Will plan for Bone marrow bx 8/7 Risks and benefits discussed with the patient including, but not limited to bleeding, infection, damage to adjacent structures or low yield requiring additional tests.  All of the patient's questions were answered, patient is agreeable to proceed. Consent signed and in chart.    Thank you for this interesting consult.  I greatly enjoyed meeting LATAUNYA RUUD and look forward to participating in their care.  A copy of this report was sent to the requesting provider on this date.  Electronically Signed: Lavonia Drafts, PA-C 05/23/2018, 7:50 AM   I spent a total of 20 Minutes    in face to face in clinical consultation, greater than 50% of which was counseling/coordinating care for Bone marrow bx

## 2018-05-23 NOTE — Progress Notes (Signed)
Progress Note    Alexandra Pierce  KGY:[id number] DOB: 1946-08-03  DOA: 05/22/2018 PCP: Chesley Noon, MD    Brief Narrative:    Medical records reviewed and are as summarized below:  Alexandra Pierce is an 72 y.o. female with medical history significant of CVA x 2; afib on AC; obesity; HTN; and HLD presenting with weakness.  She thought she had a TIA.  She is very weak.  She had symptoms lasted Monday evening - she blacked out and fell.  She was standing in front of her sink and passed out.  She wears a contact on the right and she thought it was messed up - it was getting worse and worse and she tried to take out her contact.  The right eye was very blurry and then she felt like there was a bolt of light from that eye.  She doesn't remember falling but she wound up on the bathroom floor without injury.  She may have hit her head on the tile floor but she did not have an injury to it.  Possible LOC for seconds only.  She was a little disoriented and so laid still for a few more seconds and then realized she was okay.  Since everything appeared to be in order, she decided not to come to the ER.  Tuesday AM, she did not feel good.  She felt generally weak and she stayed home from work all week.  She slept a lot.  Just walking short distances made her feel exhausted.  Her legs would feel heavy and she would have to sit.  Her daughters insisted that she come in today.  The symptoms did not get worse.  She went to work this AM but then decided to come to the ER.  She has had SOB for a few months - had a breathing test with cardiologist and she has an appointment with pulm at the end of the month.  She can't walk distances like she used to because of the SOB.    She has not noticed bleeding.  She was on Coumadin from 2000 but changed to Xarelto several years ago.  +night sweats, unchanged.    Assessment/Plan:   Principal Problem:   Symptomatic anemia Active Problems:   Hyperlipidemia   Essential  hypertension   Atrial fibrillation (HCC)   Syncope  Symptomatic anemia -Patient without h/o anemia or similar episode  -She was admitted for cath in 6/19 due to progressive DOE; cath was reassuring and she was referred to pulm and scheduled to have PFTs and CXR (negative) -She presents today after an episode of possible syncope with what was concerning for retinal detachment 1 week ago -Since that episode, all of those symptoms have not recurred but she has had ongoing weakness and fatigue -Today, she is found to have Hgb 7.6, prior 11.4 on 6/10, 12.3 on 10/03/17, and 13.9 on 04/13/17 -MCV is normal, RDW is normal -Hematology consult: would recommend CT chest/abd/pelvis to r/o lymphoma in the setting of elevated LDH and anemia. -IR to do bone marrow biopsy on 8/7 -s/p 2 units PRBC    Syncope -appears to be related to symptomatic anemia  HTN -Continue Lopressor  Afib on AC -Rate controlled on lopressor -Hold Xarelto for bone marrow biopsy  HLD -Intolerant to statins -Continue fenofibrate     Family Communication/Anticipated D/C date and plan/Code Status   DVT prophylaxis: scd Code Status: Full Code.  Family Communication: none at bedside Disposition  Plan: pending work up   Medical Consultants:    hematology     Subjective:   No SOB, no CP  Objective:    Vitals:   05/23/18 0131 05/23/18 0320 05/23/18 0956 05/23/18 1325  BP: 113/69 110/76 111/68 (!) 149/125  Pulse: 89 78 79 75  Resp: '18 18  20  ' Temp: 98.3 F (36.8 C) 98.3 F (36.8 C)  98.7 F (37.1 C)  TempSrc: Oral Oral  Oral  SpO2: 96% 98%  100%  Weight:      Height:        Intake/Output Summary (Last 24 hours) at 05/23/2018 1430 Last data filed at 05/23/2018 0314 Gross per 24 hour  Intake 699.5 ml  Output -  Net 699.5 ml   Filed Weights   05/22/18 1158 05/22/18 1728  Weight: 80.7 kg (178 lb) 79.7 kg (175 lb 9.6 oz)    Exam: A+Ox3, NAD rrr Clear No LE edema  Data Reviewed:   I  have personally reviewed following labs and imaging studies:  Labs: Labs show the following:   Basic Metabolic Panel: Recent Labs  Lab 05/22/18 1205 05/23/18 0524  NA 137 141  K 4.1 4.7  CL 101 105  CO2 25 27  GLUCOSE 116* 126*  BUN 20 16  CREATININE 1.02* 0.94  CALCIUM 9.1 8.6*  MG  --  2.2   GFR Estimated Creatinine Clearance: 56.8 mL/min (by C-G formula based on SCr of 0.94 mg/dL). Liver Function Tests: Recent Labs  Lab 05/22/18 1646  AST 26  ALT 31  ALKPHOS 60  BILITOT 1.7*  PROT 6.8  ALBUMIN 3.4*   No results for input(s): LIPASE, AMYLASE in the last 168 hours. No results for input(s): AMMONIA in the last 168 hours. Coagulation profile Recent Labs  Lab 05/23/18 0655  INR 1.29    CBC: Recent Labs  Lab 05/22/18 1205 05/22/18 2151 05/23/18 0524  WBC 8.5 7.2 7.3  HGB 7.6* 6.5* 9.0*  HCT 23.4* 19.4* 27.4*  MCV 99.2 96.5 93.8  PLT 685* 546* 573*   Cardiac Enzymes: No results for input(s): CKTOTAL, CKMB, CKMBINDEX, TROPONINI in the last 168 hours. BNP (last 3 results) No results for input(s): PROBNP in the last 8760 hours. CBG: No results for input(s): GLUCAP in the last 168 hours. D-Dimer: No results for input(s): DDIMER in the last 72 hours. Hgb A1c: No results for input(s): HGBA1C in the last 72 hours. Lipid Profile: No results for input(s): CHOL, HDL, LDLCALC, TRIG, CHOLHDL, LDLDIRECT in the last 72 hours. Thyroid function studies: Recent Labs    05/22/18 1646  TSH 1.626   Anemia work up: Recent Labs    05/22/18 1646  VITAMINB12 373  FOLATE 13.3  FERRITIN 845*  TIBC 316  IRON 94  RETICCTPCT 3.0   Sepsis Labs: Recent Labs  Lab 05/22/18 1205 05/22/18 2151 05/23/18 0524  WBC 8.5 7.2 7.3    Microbiology No results found for this or any previous visit (from the past 240 hour(s)).  Procedures and diagnostic studies:  No results found.  Medications:   . sodium chloride   Intravenous Once  . docusate sodium  100 mg Oral  BID  . fenofibrate  160 mg Oral Daily  . iopamidol      . metoprolol tartrate  25 mg Oral BID  . sodium chloride flush  3 mL Intravenous Q12H   Continuous Infusions:   LOS: 0 days   Geradine Girt  [facility]   *Please refer to [email or web address],  password [email or web address] to get updated schedule on who will round on this patient, as hospitalists switch teams weekly. If 7PM-7AM, please contact night-coverage at [email or web address], password [email or web address] for any overnight needs.  05/23/2018, 2:30 PM

## 2018-05-23 NOTE — Progress Notes (Signed)
Hematology short note   Consult received. Chart reviewed  . CBC Latest Ref Rng & Units 05/23/2018 05/22/2018 05/22/2018  WBC 4.0 - 10.5 K/uL 7.3 7.2 8.5  Hemoglobin 12.0 - 15.0 g/dL 9.0(L) 6.5(LL) 7.6(L)  Hematocrit 36.0 - 46.0 % 27.4(L) 19.4(L) 23.4(L)  Platelets 150 - 400 K/uL 573(H) 546(H) 685(H)   . CMP Latest Ref Rng & Units 05/23/2018 05/22/2018 03/27/2018  Glucose 70 - 99 mg/dL 126(H) 116(H) 106(H)  BUN 8 - 23 mg/dL 16 20 16   Creatinine 0.44 - 1.00 mg/dL 0.94 1.02(H) 0.91  Sodium 135 - 145 mmol/L 141 137 140  Potassium 3.5 - 5.1 mmol/L 4.7 4.1 5.2  Chloride 98 - 111 mmol/L 105 101 105  CO2 22 - 32 mmol/L 27 25 24   Calcium 8.9 - 10.3 mg/dL 8.6(L) 9.1 8.8  Total Protein 6.5 - 8.1 g/dL - 6.8 6.9  Total Bilirubin 0.3 - 1.2 mg/dL - 1.7(H) 1.0  Alkaline Phos 38 - 126 U/L - 60 43  AST 15 - 41 U/L - 26 17  ALT 0 - 44 U/L - 31 12   . Lab Results  Component Value Date   LDH 517 (H) 05/22/2018   Haptoglobin pending. DAT neg  PLAN -getting BM Bx today -would recommend CT chest/abd/pelvis to r/o lymphoma in the setting of elevated LDH and anemia. -patient asleep early this AM will f/u again later this afternoon. -plz call if any acute questions in the intermittent. -if haptoglobin low and suggests Coombs neg hemolysis - would pursue further w/u with regards to this.  [place] [phone]

## 2018-05-24 ENCOUNTER — Observation Stay (HOSPITAL_COMMUNITY): Payer: Medicare Other

## 2018-05-24 DIAGNOSIS — E785 Hyperlipidemia, unspecified: Secondary | ICD-10-CM | POA: Diagnosis not present

## 2018-05-24 DIAGNOSIS — D649 Anemia, unspecified: Secondary | ICD-10-CM | POA: Diagnosis not present

## 2018-05-24 DIAGNOSIS — I10 Essential (primary) hypertension: Secondary | ICD-10-CM | POA: Diagnosis not present

## 2018-05-24 DIAGNOSIS — I482 Chronic atrial fibrillation: Secondary | ICD-10-CM

## 2018-05-24 LAB — BASIC METABOLIC PANEL
ANION GAP: 9 (ref 5–15)
BUN: 17 mg/dL (ref 8–23)
CHLORIDE: 104 mmol/L (ref 98–111)
CO2: 26 mmol/L (ref 22–32)
Calcium: 8.7 mg/dL — ABNORMAL LOW (ref 8.9–10.3)
Creatinine, Ser: 1.07 mg/dL — ABNORMAL HIGH (ref 0.44–1.00)
GFR calc Af Amer: 58 mL/min — ABNORMAL LOW (ref >60)
GFR calc non Af Amer: 50 mL/min — ABNORMAL LOW (ref >60)
Glucose, Bld: 113 mg/dL — ABNORMAL HIGH (ref 70–99)
POTASSIUM: 4.9 mmol/L (ref 3.5–5.1)
Sodium: 139 mmol/L (ref 135–145)

## 2018-05-24 LAB — MULTIPLE MYELOMA PANEL, SERUM
ALPHA2 GLOB SERPL ELPH-MCNC: 0.5 g/dL (ref 0.4–1.0)
Albumin SerPl Elph-Mcnc: 3.4 g/dL (ref 2.9–4.4)
Albumin/Glob SerPl: 1.2 (ref 0.7–1.7)
Alpha 1: 0.3 g/dL (ref 0.0–0.4)
B-GLOBULIN SERPL ELPH-MCNC: 1 g/dL (ref 0.7–1.3)
Gamma Glob SerPl Elph-Mcnc: 1 g/dL (ref 0.4–1.8)
Globulin, Total: 2.9 g/dL (ref 2.2–3.9)
IGG (IMMUNOGLOBIN G), SERUM: 1022 mg/dL (ref 700–1600)
IgA: 138 mg/dL (ref 64–422)
IgM (Immunoglobulin M), Srm: 196 mg/dL (ref 26–217)
TOTAL PROTEIN ELP: 6.3 g/dL (ref 6.0–8.5)

## 2018-05-24 LAB — CBC
HCT: 29.8 % — ABNORMAL LOW (ref 36.0–46.0)
HEMOGLOBIN: 9.7 g/dL — AB (ref 12.0–15.0)
MCH: 30.8 pg (ref 26.0–34.0)
MCHC: 32.6 g/dL (ref 30.0–36.0)
MCV: 94.6 fL (ref 78.0–100.0)
Platelets: 618 10*3/uL — ABNORMAL HIGH (ref 150–400)
RBC: 3.15 MIL/uL — AB (ref 3.87–5.11)
RDW: 17.6 % — ABNORMAL HIGH (ref 11.5–15.5)
WBC: 8.5 10*3/uL (ref 4.0–10.5)

## 2018-05-24 LAB — BPAM RBC
BLOOD PRODUCT EXPIRATION DATE: 201908112359
BLOOD PRODUCT EXPIRATION DATE: 201908262359
ISSUE DATE / TIME: 201908052235
ISSUE DATE / TIME: 201908060053
UNIT TYPE AND RH: 6200
Unit Type and Rh: 6200

## 2018-05-24 LAB — TYPE AND SCREEN
ABO/RH(D): A POS
ANTIBODY SCREEN: NEGATIVE
DAT, IgG: NEGATIVE
Unit division: 0
Unit division: 0

## 2018-05-24 MED ORDER — MIDAZOLAM HCL 2 MG/2ML IJ SOLN
INTRAMUSCULAR | Status: AC
  Filled 2018-05-24: qty 4

## 2018-05-24 MED ORDER — METOPROLOL TARTRATE 12.5 MG HALF TABLET: 12.5000 mg | ORAL_TABLET | Freq: Two times a day (BID) | ORAL | Status: DC

## 2018-05-24 MED ORDER — FENTANYL CITRATE (PF) 100 MCG/2ML IJ SOLN
INTRAMUSCULAR | Status: AC
  Filled 2018-05-24: qty 4

## 2018-05-24 MED ORDER — LIDOCAINE HCL 1 % IJ SOLN
INTRAMUSCULAR | Status: AC
  Filled 2018-05-24: qty 20

## 2018-05-24 MED ORDER — FENTANYL CITRATE (PF) 100 MCG/2ML IJ SOLN
INTRAMUSCULAR | Status: AC | PRN
  Administered 2018-05-24 (×2): 25 ug via INTRAVENOUS

## 2018-05-24 MED ORDER — MIDAZOLAM HCL 2 MG/2ML IJ SOLN
INTRAMUSCULAR | Status: AC | PRN
  Administered 2018-05-24: 1 mg via INTRAVENOUS

## 2018-05-24 MED ORDER — MIDAZOLAM HCL 5 MG/5ML IJ SOLN
INTRAMUSCULAR | Status: AC | PRN
  Administered 2018-05-24: 0.5 mg via INTRAVENOUS

## 2018-05-24 MED ORDER — METOPROLOL TARTRATE 12.5 MG HALF TABLET
12.5000 mg | ORAL_TABLET | Freq: Two times a day (BID) | ORAL | Status: DC
  Administered 2018-05-24 – 2018-05-25 (×3): 12.5 mg via ORAL
  Filled 2018-05-24 (×2): qty 1

## 2018-05-24 NOTE — Procedures (Signed)
CT guided bone marrow biopsy.  2 aspirates and 2 core biopsies from left ilium.  Minimal blood loss and no immediate complication.

## 2018-05-24 NOTE — Progress Notes (Addendum)
Progress Note    CHALICE PHILBERT  VPX:[id number] DOB: 09-Feb-1946  DOA: 05/22/2018 PCP: Chesley Noon, MD    Brief Narrative:    Medical records reviewed and are as summarized below:  ROSALIN BUSTER is an 72 y.o. female with medical history significant of CVA x 2; afib on AC; obesity; HTN; and HLD presenting with weakness.  She thought she had a TIA.  She is very weak.  She had symptoms lasted Monday evening - she blacked out and fell.  She was standing in front of her sink and passed out.  She wears a contact on the right and she thought it was messed up - it was getting worse and worse and she tried to take out her contact.  The right eye was very blurry and then she felt like there was a bolt of light from that eye.  She doesn't remember falling but she wound up on the bathroom floor without injury.  She may have hit her head on the tile floor but she did not have an injury to it.  Possible LOC for seconds only.  She was a little disoriented and so laid still for a few more seconds and then realized she was okay.  Since everything appeared to be in order, she decided not to come to the ER.  Tuesday AM, she did not feel good.  She felt generally weak and she stayed home from work all week.  She slept a lot.  Just walking short distances made her feel exhausted.  Her legs would feel heavy and she would have to sit.  Her daughters insisted that she come in today.  The symptoms did not get worse.  She went to work this AM but then decided to come to the ER.  She has had SOB for a few months - had a breathing test with cardiologist and she has an appointment with pulm at the end of the month.  She can't walk distances like she used to because of the SOB.    She has not noticed bleeding.  She was on Coumadin from 2000 but changed to Xarelto several years ago.  +night sweats, unchanged.    Assessment/Plan:   Principal Problem:   Symptomatic anemia Active Problems:   Hyperlipidemia   Essential  hypertension   Atrial fibrillation (HCC)   Syncope  Symptomatic anemia -Patient without h/o anemia or similar episode  -She was admitted for cath in 6/19 due to progressive DOE; cath was reassuring and she was referred to pulm and scheduled to have PFTs and CXR (negative) -In ER, she is found to have Hgb 7.6, prior 11.4 on 6/10, 12.3 on 10/03/17, and 13.9 on 04/13/17 -MCV is normal, RDW is normal -s/p 2 units PRBC   -hematology consult appreciated-- await recommendations -LDH elevated- CT abd/pelvis/chest done--- mildly enlarged spleen, ? Right renal abnormality-- U/A w/o infection -plan per hematology -BMB done 8/7  Syncope -appears to be related to symptomatic anemia  HTN -Continue Lopressor at a lower dose  parox Afib on AC -Rate controlled on lopressor -resume xarelto in AM  HLD -Intolerant to statins -Continue fenofibrate     Family Communication/Anticipated D/C date and plan/Code Status   DVT prophylaxis: scd Code Status: Full Code.  Family Communication: none at bedside Disposition Plan: pending work up   Medical Consultants:    hematology     Subjective:   Feels better since she got blood --- is wondering what happened  Objective:  Vitals:   05/24/18 0905 05/24/18 0910 05/24/18 0915 05/24/18 1016  BP: 120/82 116/76 110/69 102/81  Pulse: 88 98 84 80  Resp:      Temp:      TempSrc:      SpO2: 99% 100% 95%   Weight:      Height:       No intake or output data in the 24 hours ending 05/24/18 1250 Filed Weights   05/22/18 1158 05/22/18 1728  Weight: 80.7 kg (178 lb) 79.7 kg (175 lb 9.6 oz)    Exam: Alert, NAD rrr No increased work of breathing No LE edema  Data Reviewed:   I have personally reviewed following labs and imaging studies:  Labs: Labs show the following:   Basic Metabolic Panel: Recent Labs  Lab 05/22/18 1205 05/23/18 0524 05/24/18 0455  NA 137 141 139  K 4.1 4.7 4.9  CL 101 105 104  CO2 '25 27 26    ' GLUCOSE 116* 126* 113*  BUN '20 16 17  ' CREATININE 1.02* 0.94 1.07*  CALCIUM 9.1 8.6* 8.7*  MG  --  2.2  --    GFR Estimated Creatinine Clearance: 49.9 mL/min (A) (by C-G formula based on SCr of 1.07 mg/dL (H)). Liver Function Tests: Recent Labs  Lab 05/22/18 1646  AST 26  ALT 31  ALKPHOS 60  BILITOT 1.7*  PROT 6.8  ALBUMIN 3.4*   No results for input(s): LIPASE, AMYLASE in the last 168 hours. No results for input(s): AMMONIA in the last 168 hours. Coagulation profile Recent Labs  Lab 05/23/18 0655  INR 1.29    CBC: Recent Labs  Lab 05/22/18 1205 05/22/18 2151 05/23/18 0524 05/24/18 0455  WBC 8.5 7.2 7.3 8.5  HGB 7.6* 6.5* 9.0* 9.7*  HCT 23.4* 19.4* 27.4* 29.8*  MCV 99.2 96.5 93.8 94.6  PLT 685* 546* 573* 618*   Cardiac Enzymes: No results for input(s): CKTOTAL, CKMB, CKMBINDEX, TROPONINI in the last 168 hours. BNP (last 3 results) No results for input(s): PROBNP in the last 8760 hours. CBG: No results for input(s): GLUCAP in the last 168 hours. D-Dimer: No results for input(s): DDIMER in the last 72 hours. Hgb A1c: No results for input(s): HGBA1C in the last 72 hours. Lipid Profile: No results for input(s): CHOL, HDL, LDLCALC, TRIG, CHOLHDL, LDLDIRECT in the last 72 hours. Thyroid function studies: Recent Labs    05/22/18 1646  TSH 1.626   Anemia work up: Recent Labs    05/22/18 1646  VITAMINB12 373  FOLATE 13.3  FERRITIN 845*  TIBC 316  IRON 94  RETICCTPCT 3.0   Sepsis Labs: Recent Labs  Lab 05/22/18 1205 05/22/18 2151 05/23/18 0524 05/24/18 0455  WBC 8.5 7.2 7.3 8.5    Microbiology No results found for this or any previous visit (from the past 240 hour(s)).  Procedures and diagnostic studies:  Ct Chest W Contrast  Addendum Date: 05/23/2018   ADDENDUM REPORT: 05/23/2018 15:21 ADDENDUM: These results were called by telephone at the time of interpretation on 05/23/2018 at 3:21 pm to Dr. Eulogio Bear , who verbally acknowledged these  results. Electronically Signed   By: Van Clines M.D.   On: 05/23/2018 15:21   Result Date: 05/23/2018 CLINICAL DATA:  Anemia, weakness EXAM: CT CHEST, ABDOMEN, AND PELVIS WITH CONTRAST TECHNIQUE: Multidetector CT imaging of the chest, abdomen and pelvis was performed following the standard protocol during bolus administration of intravenous contrast. CONTRAST:  27m OMNIPAQUE IOHEXOL 300 MG/ML  SOLN COMPARISON:  Chest radiograph  04/11/2018 FINDINGS: CT CHEST FINDINGS Cardiovascular: Coronary, aortic arch, and branch vessel atherosclerotic vascular disease. Mediastinum/Nodes: Unremarkable Lungs/Pleura: Biapical pleuroparenchymal scarring. Old granulomatous disease with calcified granulomas most notably in a right peribronchovascular distribution. Paraseptal emphysema. Musculoskeletal: Mild thoracic spondylosis. Small sebaceous cyst or similar likely benign lesion along the right paracentral back at the T12 vertebral level. CT ABDOMEN PELVIS FINDINGS Hepatobiliary: Various hypodense hepatic lesions are present, the smaller lesions are technically too small to characterize but the larger lesions are fluid density and compatible with cysts. The largest is in the lateral segment left hepatic lobe measuring 6.1 by 5.1 cm with internal density of 6 Hounsfield units. The gallbladder is contracted. Cholelithiasis is present with nitrogen gas phenomenon within at least 1 gallstone. The common hepatic duct measures up to 1.2 cm in diameter, with the common bile duct at 0.9 cm. Pancreas: Unremarkable Spleen: The spleen measures 14.3 by 5.6 by 14.2 cm (volume = 600 cm^3) and is slightly heterogeneous on the late arterial phase images but homogeneous on the delayed images. Adrenals/Urinary Tract: Both adrenal glands appear normal. There is an infiltrative process in the lower half of the right kidney with abnormal hypoenhancement and heterogeneity of enhancement of the involved renal parenchyma, and indistinctness of the  cortical margin with surrounding stranding. Infiltrative process or perirenal fluid tracks along the capsular margin of the kidney on image 23/8. Possibilities include pyelonephritis (and early renal abscess is difficult to exclude), renal infarct, and infiltrative malignancy such as renal involvement by lymphoma. No hydronephrosis or hydroureter. No appreciable urinary tract calculi. Stomach/Bowel: Unremarkable Vascular/Lymphatic: Aortoiliac atherosclerotic vascular disease. No pathologic retroperitoneal adenopathy is identified. Reproductive: Retroverted uterus, with right fundal calcification likely representing a calcified fibroid. Adnexa unremarkable. Other: Perirenal stranding on the right with some adjacent fluid in the right paracolic gutter. There is a 1.1 by 1.1 cm low-density nodule along the inferior margin of the right hepatic lobe which could represent complex fluid, tumor nodule, exophytic hepatic cyst, or a small abscess. Small amount of free pelvic fluid eccentric to the right. Musculoskeletal: Dextroconvex lumbar scoliosis with rotary component. Lumbar spondylosis and degenerative disc disease causing mild right foraminal impingement at L4-5. IMPRESSION: 1. Abnormal hypoenhancement in the parenchyma of the right kidney lower pole compatible with infiltrative process, possibilities include pyelonephritis (early renal abscess difficult to exclude), renal infarct, and infiltrative malignancy such as renal lymphoma. Correlate with urine analysis. There is potentially some fluid tracking along the right renal capsular margin. 2. Mild splenomegaly without definite splenic focal lesion. Splenomegaly is nonspecific but could be a corroborative finding of lymphoma. 3. Scattered hypodense hepatic lesions are likely cysts, although the smaller lesions are technically nonspecific. 4. The right perirenal stranding is associated with a small amount of fluid in the adjacent right paracolic gutter, and a small  fluid collection along the inferior margin of the liver which could be an exophytic cyst, small complex fluid collection, or conceivably a tumor nodule. 5. Other imaging findings of potential clinical significance: Aortic Atherosclerosis (ICD10-I70.0). Coronary atherosclerosis. Emphysema (ICD10-J43.9). Old granulomatous disease. Calcified uterine fibroid. Right foraminal impingement at L4-5. Cholelithiasis. Mild extrahepatic biliary dilatation. Radiology assistant personnel have been notified to put me in telephone contact with the referring physician or the referring physician's clinical representative in order to discuss these findings. Once this communication is established I will issue an addendum to this report for documentation purposes. Electronically Signed: By: Van Clines M.D. On: 05/23/2018 15:17   Ct Abdomen Pelvis W Contrast  Addendum Date: 05/23/2018  ADDENDUM REPORT: 05/23/2018 15:21 ADDENDUM: These results were called by telephone at the time of interpretation on 05/23/2018 at 3:21 pm to Dr. Eulogio Bear , who verbally acknowledged these results. Electronically Signed   By: Van Clines M.D.   On: 05/23/2018 15:21   Result Date: 05/23/2018 CLINICAL DATA:  Anemia, weakness EXAM: CT CHEST, ABDOMEN, AND PELVIS WITH CONTRAST TECHNIQUE: Multidetector CT imaging of the chest, abdomen and pelvis was performed following the standard protocol during bolus administration of intravenous contrast. CONTRAST:  69m OMNIPAQUE IOHEXOL 300 MG/ML  SOLN COMPARISON:  Chest radiograph 04/11/2018 FINDINGS: CT CHEST FINDINGS Cardiovascular: Coronary, aortic arch, and branch vessel atherosclerotic vascular disease. Mediastinum/Nodes: Unremarkable Lungs/Pleura: Biapical pleuroparenchymal scarring. Old granulomatous disease with calcified granulomas most notably in a right peribronchovascular distribution. Paraseptal emphysema. Musculoskeletal: Mild thoracic spondylosis. Small sebaceous cyst or similar likely  benign lesion along the right paracentral back at the T12 vertebral level. CT ABDOMEN PELVIS FINDINGS Hepatobiliary: Various hypodense hepatic lesions are present, the smaller lesions are technically too small to characterize but the larger lesions are fluid density and compatible with cysts. The largest is in the lateral segment left hepatic lobe measuring 6.1 by 5.1 cm with internal density of 6 Hounsfield units. The gallbladder is contracted. Cholelithiasis is present with nitrogen gas phenomenon within at least 1 gallstone. The common hepatic duct measures up to 1.2 cm in diameter, with the common bile duct at 0.9 cm. Pancreas: Unremarkable Spleen: The spleen measures 14.3 by 5.6 by 14.2 cm (volume = 600 cm^3) and is slightly heterogeneous on the late arterial phase images but homogeneous on the delayed images. Adrenals/Urinary Tract: Both adrenal glands appear normal. There is an infiltrative process in the lower half of the right kidney with abnormal hypoenhancement and heterogeneity of enhancement of the involved renal parenchyma, and indistinctness of the cortical margin with surrounding stranding. Infiltrative process or perirenal fluid tracks along the capsular margin of the kidney on image 23/8. Possibilities include pyelonephritis (and early renal abscess is difficult to exclude), renal infarct, and infiltrative malignancy such as renal involvement by lymphoma. No hydronephrosis or hydroureter. No appreciable urinary tract calculi. Stomach/Bowel: Unremarkable Vascular/Lymphatic: Aortoiliac atherosclerotic vascular disease. No pathologic retroperitoneal adenopathy is identified. Reproductive: Retroverted uterus, with right fundal calcification likely representing a calcified fibroid. Adnexa unremarkable. Other: Perirenal stranding on the right with some adjacent fluid in the right paracolic gutter. There is a 1.1 by 1.1 cm low-density nodule along the inferior margin of the right hepatic lobe which could  represent complex fluid, tumor nodule, exophytic hepatic cyst, or a small abscess. Small amount of free pelvic fluid eccentric to the right. Musculoskeletal: Dextroconvex lumbar scoliosis with rotary component. Lumbar spondylosis and degenerative disc disease causing mild right foraminal impingement at L4-5. IMPRESSION: 1. Abnormal hypoenhancement in the parenchyma of the right kidney lower pole compatible with infiltrative process, possibilities include pyelonephritis (early renal abscess difficult to exclude), renal infarct, and infiltrative malignancy such as renal lymphoma. Correlate with urine analysis. There is potentially some fluid tracking along the right renal capsular margin. 2. Mild splenomegaly without definite splenic focal lesion. Splenomegaly is nonspecific but could be a corroborative finding of lymphoma. 3. Scattered hypodense hepatic lesions are likely cysts, although the smaller lesions are technically nonspecific. 4. The right perirenal stranding is associated with a small amount of fluid in the adjacent right paracolic gutter, and a small fluid collection along the inferior margin of the liver which could be an exophytic cyst, small complex fluid collection, or conceivably a tumor  nodule. 5. Other imaging findings of potential clinical significance: Aortic Atherosclerosis (ICD10-I70.0). Coronary atherosclerosis. Emphysema (ICD10-J43.9). Old granulomatous disease. Calcified uterine fibroid. Right foraminal impingement at L4-5. Cholelithiasis. Mild extrahepatic biliary dilatation. Radiology assistant personnel have been notified to put me in telephone contact with the referring physician or the referring physician's clinical representative in order to discuss these findings. Once this communication is established I will issue an addendum to this report for documentation purposes. Electronically Signed: By: Van Clines M.D. On: 05/23/2018 15:17   Ct Bone Marrow Biopsy &  Aspiration  Result Date: 05/24/2018 INDICATION: 72 year old with anemia workup. EXAM: CT GUIDED BONE MARROW ASPIRATES AND BIOPSY Physician: Stephan Minister. Anselm Pancoast, MD MEDICATIONS: None. ANESTHESIA/SEDATION: Fentanyl 50 mcg IV; Versed 1.5 mg IV Moderate Sedation Time:  21 minutes The patient was continuously monitored during the procedure by the interventional radiology nurse under my direct supervision. COMPLICATIONS: None immediate. PROCEDURE: The procedure was explained to the patient. The risks and benefits of the procedure were discussed and the patient's questions were addressed. Informed consent was obtained from the patient. The patient was placed prone on CT table. Images of the pelvis were obtained. The left side of back was prepped and draped in sterile fashion. The skin and left posterior ilium were anesthetized with 1% lidocaine. 11 gauge bone needle was directed into the left ilium with CT guidance. Two aspirates and two core biopsies were obtained. Bandage placed over the puncture site. FINDINGS: Bone needle directed into the posterior left ilium. IMPRESSION: CT guided bone marrow aspiration and core biopsy. Electronically Signed   By: Markus Daft M.D.   On: 05/24/2018 10:09    Medications:   . sodium chloride   Intravenous Once  . docusate sodium  100 mg Oral BID  . fenofibrate  160 mg Oral Daily  . lidocaine      . metoprolol tartrate  12.5 mg Oral BID  . sodium chloride flush  3 mL Intravenous Q12H   Continuous Infusions:   LOS: 0 days   Geradine Girt  [facility]   *Please refer to [email or web address], password [email or web address] to get updated schedule on who will round on this patient, as hospitalists switch teams weekly. If 7PM-7AM, please contact night-coverage at [email or web address], password [email or web address] for any overnight needs.  05/24/2018, 12:50 PM

## 2018-05-24 NOTE — Progress Notes (Signed)
ETCO2 waveform poor r/t pt position

## 2018-05-25 ENCOUNTER — Telehealth: Payer: Self-pay | Admitting: Hematology

## 2018-05-25 ENCOUNTER — Encounter: Payer: Self-pay | Admitting: Hematology

## 2018-05-25 DIAGNOSIS — N289 Disorder of kidney and ureter, unspecified: Secondary | ICD-10-CM

## 2018-05-25 DIAGNOSIS — I482 Chronic atrial fibrillation: Secondary | ICD-10-CM | POA: Diagnosis not present

## 2018-05-25 DIAGNOSIS — R7402 Elevation of levels of lactic acid dehydrogenase (LDH): Secondary | ICD-10-CM

## 2018-05-25 DIAGNOSIS — I10 Essential (primary) hypertension: Secondary | ICD-10-CM | POA: Diagnosis not present

## 2018-05-25 DIAGNOSIS — D649 Anemia, unspecified: Secondary | ICD-10-CM | POA: Diagnosis not present

## 2018-05-25 DIAGNOSIS — R740 Nonspecific elevation of levels of transaminase and lactic acid dehydrogenase [LDH]: Secondary | ICD-10-CM

## 2018-05-25 LAB — BASIC METABOLIC PANEL
ANION GAP: 9 (ref 5–15)
BUN: 20 mg/dL (ref 8–23)
CO2: 25 mmol/L (ref 22–32)
CREATININE: 1.07 mg/dL — AB (ref 0.44–1.00)
Calcium: 9.1 mg/dL (ref 8.9–10.3)
Chloride: 105 mmol/L (ref 98–111)
GFR calc non Af Amer: 50 mL/min — ABNORMAL LOW (ref >60)
GFR, EST AFRICAN AMERICAN: 58 mL/min — AB (ref >60)
Glucose, Bld: 122 mg/dL — ABNORMAL HIGH (ref 70–99)
Potassium: 4.8 mmol/L (ref 3.5–5.1)
Sodium: 139 mmol/L (ref 135–145)

## 2018-05-25 LAB — CBC
HEMATOCRIT: 30 % — AB (ref 36.0–46.0)
Hemoglobin: 9.9 g/dL — ABNORMAL LOW (ref 12.0–15.0)
MCH: 31.5 pg (ref 26.0–34.0)
MCHC: 33 g/dL (ref 30.0–36.0)
MCV: 95.5 fL (ref 78.0–100.0)
Platelets: 483 10*3/uL — ABNORMAL HIGH (ref 150–400)
RBC: 3.14 MIL/uL — ABNORMAL LOW (ref 3.87–5.11)
RDW: 16.6 % — AB (ref 11.5–15.5)
WBC: 7.4 10*3/uL (ref 4.0–10.5)

## 2018-05-25 LAB — PROCALCITONIN: Procalcitonin: 0.22 ng/mL

## 2018-05-25 MED ORDER — METOPROLOL TARTRATE 25 MG PO TABS: 12.5000 mg | ORAL_TABLET | Freq: Two times a day (BID) | ORAL | 3 refills | Status: DC

## 2018-05-25 NOTE — Discharge Summary (Signed)
Physician Discharge Summary  Alexandra Pierce HYW:[id number] DOB: 1946-02-03 DOA: 05/22/2018  PCP: Chesley Noon, MD  Admit date: 05/22/2018 Discharge date: 05/25/2018  Admitted From: home Discharge disposition: home   Recommendations for Outpatient Follow-Up:   1. Close follow up with Dr. Irene Limbo for BMB results as well as other outstanding labs 2. Metoprolol dose adjusted to half as BP running low here-- change as needed 3. Cbc prn 4. May need PET or biopsy of right renal abnormality   Discharge Diagnosis:   Principal Problem:   Symptomatic anemia Active Problems:   Hyperlipidemia   Essential hypertension   Atrial fibrillation (HCC)   Syncope    Discharge Condition: Improved.  Diet recommendation: Low sodium, heart healthy.  Wound care: None.  Code status: Full.   History of Present Illness:   Alexandra Pierce is a 72 y.o. female with medical history significant of CVA x 2; afib on AC; obesity; HTN; and HLD presenting with weakness.  She thought she had a TIA.  She is very weak.  She had symptoms lasted Monday evening - she blacked out and fell.  She was standing in front of her sink and passed out.  She wears a contact on the right and she thought it was messed up - it was getting worse and worse and she tried to take out her contact.  The right eye was very blurry and then she felt like there was a bolt of light from that eye.  She doesn't remember falling but she wound up on the bathroom floor without injury.  She may have hit her head on the tile floor but she did not have an injury to it.  Possible LOC for seconds only.  She was a little disoriented and so laid still for a few more seconds and then realized she was okay.  Since everything appeared to be in order, she decided not to come to the ER.  Tuesday AM, she did not feel good.  She felt generally weak and she stayed home from work all week.  She slept a lot.  Just walking short distances made her feel exhausted.  Her  legs would feel heavy and she would have to sit.  Her daughters insisted that she come in today.  The symptoms did not get worse.  She went to work this AM but then decided to come to the ER.  She has had SOB for a few months - had a breathing test with cardiologist and she has an appointment with pulm at the end of the month.  She can't walk distances like she used to because of the SOB.    She has not noticed bleeding.  She was on Coumadin from 2000 but changed to Xarelto several years ago.  +night sweats, unchanged.   Hospital Course by Problem:   Symptomatic anemia -Patient without h/o anemia or similar episode  -She was admitted for cath in 6/19 due to progressive DOE; cath was reassuring and she was referred to pulm and scheduled to have PFTs and CXR (negative) -In ER, she is found to have Hgb 7.6, prior 11.4 on 6/10, 12.3 on 10/03/17, and 13.9 on 04/13/17 -MCV is normal, RDW is normal -s/p 2 units PRBC   -hematology consult appreciated -LDH elevated- CT abd/pelvis/chest done--- mildly enlarged spleen, ? Right renal abnormality-- U/A w/o infection -plan per hematology- follow up next week -BMB done 8/7 H/h stable  Syncope -appears to be related to symptomatic  anemia  HTN -Continue Lopressor at a lower dose  parox Afib on AC -Rate controlled on lopressor -resume xarelto  HLD -Intolerant to statins -Continue fenofibrate    Medical Consultants:    [facility] (hematology)  Discharge Exam:   Vitals:   05/25/18 0537 05/25/18 0944  BP: (!) 101/59 (!) 103/56  Pulse: 81 82  Resp: 18   Temp: 98.7 F (37.1 C)   SpO2: 98%    Vitals:   05/24/18 1504 05/24/18 2041 05/25/18 0537 05/25/18 0944  BP: 112/69 113/73 (!) 101/59 (!) 103/56  Pulse: 89 100 81 82  Resp: 16 18 18    Temp: 98.6 F (37 C) 99 F (37.2 C) 98.7 F (37.1 C)   TempSrc:      SpO2:  96% 98%   Weight:      Height:        General exam: Appears calm and comfortable.   The results of significant  diagnostics from this hospitalization (including imaging, microbiology, ancillary and laboratory) are listed below for reference.     Procedures and Diagnostic Studies:   Ct Chest W Contrast  Addendum Date: 05/23/2018   ADDENDUM REPORT: 05/23/2018 15:21 ADDENDUM: These results were called by telephone at the time of interpretation on 05/23/2018 at 3:21 pm to Dr. Eulogio Bear , who verbally acknowledged these results. Electronically Signed   By: Van Clines M.D.   On: 05/23/2018 15:21   Result Date: 05/23/2018 CLINICAL DATA:  Anemia, weakness EXAM: CT CHEST, ABDOMEN, AND PELVIS WITH CONTRAST TECHNIQUE: Multidetector CT imaging of the chest, abdomen and pelvis was performed following the standard protocol during bolus administration of intravenous contrast. CONTRAST:  31mL OMNIPAQUE IOHEXOL 300 MG/ML  SOLN COMPARISON:  Chest radiograph 04/11/2018 FINDINGS: CT CHEST FINDINGS Cardiovascular: Coronary, aortic arch, and branch vessel atherosclerotic vascular disease. Mediastinum/Nodes: Unremarkable Lungs/Pleura: Biapical pleuroparenchymal scarring. Old granulomatous disease with calcified granulomas most notably in a right peribronchovascular distribution. Paraseptal emphysema. Musculoskeletal: Mild thoracic spondylosis. Small sebaceous cyst or similar likely benign lesion along the right paracentral back at the T12 vertebral level. CT ABDOMEN PELVIS FINDINGS Hepatobiliary: Various hypodense hepatic lesions are present, the smaller lesions are technically too small to characterize but the larger lesions are fluid density and compatible with cysts. The largest is in the lateral segment left hepatic lobe measuring 6.1 by 5.1 cm with internal density of 6 Hounsfield units. The gallbladder is contracted. Cholelithiasis is present with nitrogen gas phenomenon within at least 1 gallstone. The common hepatic duct measures up to 1.2 cm in diameter, with the common bile duct at 0.9 cm. Pancreas: Unremarkable Spleen:  The spleen measures 14.3 by 5.6 by 14.2 cm (volume = 600 cm^3) and is slightly heterogeneous on the late arterial phase images but homogeneous on the delayed images. Adrenals/Urinary Tract: Both adrenal glands appear normal. There is an infiltrative process in the lower half of the right kidney with abnormal hypoenhancement and heterogeneity of enhancement of the involved renal parenchyma, and indistinctness of the cortical margin with surrounding stranding. Infiltrative process or perirenal fluid tracks along the capsular margin of the kidney on image 23/8. Possibilities include pyelonephritis (and early renal abscess is difficult to exclude), renal infarct, and infiltrative malignancy such as renal involvement by lymphoma. No hydronephrosis or hydroureter. No appreciable urinary tract calculi. Stomach/Bowel: Unremarkable Vascular/Lymphatic: Aortoiliac atherosclerotic vascular disease. No pathologic retroperitoneal adenopathy is identified. Reproductive: Retroverted uterus, with right fundal calcification likely representing a calcified fibroid. Adnexa unremarkable. Other: Perirenal stranding on the right with some adjacent fluid in  the right paracolic gutter. There is a 1.1 by 1.1 cm low-density nodule along the inferior margin of the right hepatic lobe which could represent complex fluid, tumor nodule, exophytic hepatic cyst, or a small abscess. Small amount of free pelvic fluid eccentric to the right. Musculoskeletal: Dextroconvex lumbar scoliosis with rotary component. Lumbar spondylosis and degenerative disc disease causing mild right foraminal impingement at L4-5. IMPRESSION: 1. Abnormal hypoenhancement in the parenchyma of the right kidney lower pole compatible with infiltrative process, possibilities include pyelonephritis (early renal abscess difficult to exclude), renal infarct, and infiltrative malignancy such as renal lymphoma. Correlate with urine analysis. There is potentially some fluid tracking  along the right renal capsular margin. 2. Mild splenomegaly without definite splenic focal lesion. Splenomegaly is nonspecific but could be a corroborative finding of lymphoma. 3. Scattered hypodense hepatic lesions are likely cysts, although the smaller lesions are technically nonspecific. 4. The right perirenal stranding is associated with a small amount of fluid in the adjacent right paracolic gutter, and a small fluid collection along the inferior margin of the liver which could be an exophytic cyst, small complex fluid collection, or conceivably a tumor nodule. 5. Other imaging findings of potential clinical significance: Aortic Atherosclerosis (ICD10-I70.0). Coronary atherosclerosis. Emphysema (ICD10-J43.9). Old granulomatous disease. Calcified uterine fibroid. Right foraminal impingement at L4-5. Cholelithiasis. Mild extrahepatic biliary dilatation. Radiology assistant personnel have been notified to put me in telephone contact with the referring physician or the referring physician's clinical representative in order to discuss these findings. Once this communication is established I will issue an addendum to this report for documentation purposes. Electronically Signed: By: Van Clines M.D. On: 05/23/2018 15:17   Ct Abdomen Pelvis W Contrast  Addendum Date: 05/23/2018   ADDENDUM REPORT: 05/23/2018 15:21 ADDENDUM: These results were called by telephone at the time of interpretation on 05/23/2018 at 3:21 pm to Dr. Eulogio Bear , who verbally acknowledged these results. Electronically Signed   By: Van Clines M.D.   On: 05/23/2018 15:21   Result Date: 05/23/2018 CLINICAL DATA:  Anemia, weakness EXAM: CT CHEST, ABDOMEN, AND PELVIS WITH CONTRAST TECHNIQUE: Multidetector CT imaging of the chest, abdomen and pelvis was performed following the standard protocol during bolus administration of intravenous contrast. CONTRAST:  65mL OMNIPAQUE IOHEXOL 300 MG/ML  SOLN COMPARISON:  Chest radiograph  04/11/2018 FINDINGS: CT CHEST FINDINGS Cardiovascular: Coronary, aortic arch, and branch vessel atherosclerotic vascular disease. Mediastinum/Nodes: Unremarkable Lungs/Pleura: Biapical pleuroparenchymal scarring. Old granulomatous disease with calcified granulomas most notably in a right peribronchovascular distribution. Paraseptal emphysema. Musculoskeletal: Mild thoracic spondylosis. Small sebaceous cyst or similar likely benign lesion along the right paracentral back at the T12 vertebral level. CT ABDOMEN PELVIS FINDINGS Hepatobiliary: Various hypodense hepatic lesions are present, the smaller lesions are technically too small to characterize but the larger lesions are fluid density and compatible with cysts. The largest is in the lateral segment left hepatic lobe measuring 6.1 by 5.1 cm with internal density of 6 Hounsfield units. The gallbladder is contracted. Cholelithiasis is present with nitrogen gas phenomenon within at least 1 gallstone. The common hepatic duct measures up to 1.2 cm in diameter, with the common bile duct at 0.9 cm. Pancreas: Unremarkable Spleen: The spleen measures 14.3 by 5.6 by 14.2 cm (volume = 600 cm^3) and is slightly heterogeneous on the late arterial phase images but homogeneous on the delayed images. Adrenals/Urinary Tract: Both adrenal glands appear normal. There is an infiltrative process in the lower half of the right kidney with abnormal hypoenhancement and heterogeneity of enhancement  of the involved renal parenchyma, and indistinctness of the cortical margin with surrounding stranding. Infiltrative process or perirenal fluid tracks along the capsular margin of the kidney on image 23/8. Possibilities include pyelonephritis (and early renal abscess is difficult to exclude), renal infarct, and infiltrative malignancy such as renal involvement by lymphoma. No hydronephrosis or hydroureter. No appreciable urinary tract calculi. Stomach/Bowel: Unremarkable Vascular/Lymphatic:  Aortoiliac atherosclerotic vascular disease. No pathologic retroperitoneal adenopathy is identified. Reproductive: Retroverted uterus, with right fundal calcification likely representing a calcified fibroid. Adnexa unremarkable. Other: Perirenal stranding on the right with some adjacent fluid in the right paracolic gutter. There is a 1.1 by 1.1 cm low-density nodule along the inferior margin of the right hepatic lobe which could represent complex fluid, tumor nodule, exophytic hepatic cyst, or a small abscess. Small amount of free pelvic fluid eccentric to the right. Musculoskeletal: Dextroconvex lumbar scoliosis with rotary component. Lumbar spondylosis and degenerative disc disease causing mild right foraminal impingement at L4-5. IMPRESSION: 1. Abnormal hypoenhancement in the parenchyma of the right kidney lower pole compatible with infiltrative process, possibilities include pyelonephritis (early renal abscess difficult to exclude), renal infarct, and infiltrative malignancy such as renal lymphoma. Correlate with urine analysis. There is potentially some fluid tracking along the right renal capsular margin. 2. Mild splenomegaly without definite splenic focal lesion. Splenomegaly is nonspecific but could be a corroborative finding of lymphoma. 3. Scattered hypodense hepatic lesions are likely cysts, although the smaller lesions are technically nonspecific. 4. The right perirenal stranding is associated with a small amount of fluid in the adjacent right paracolic gutter, and a small fluid collection along the inferior margin of the liver which could be an exophytic cyst, small complex fluid collection, or conceivably a tumor nodule. 5. Other imaging findings of potential clinical significance: Aortic Atherosclerosis (ICD10-I70.0). Coronary atherosclerosis. Emphysema (ICD10-J43.9). Old granulomatous disease. Calcified uterine fibroid. Right foraminal impingement at L4-5. Cholelithiasis. Mild extrahepatic biliary  dilatation. Radiology assistant personnel have been notified to put me in telephone contact with the referring physician or the referring physician's clinical representative in order to discuss these findings. Once this communication is established I will issue an addendum to this report for documentation purposes. Electronically Signed: By: Van Clines M.D. On: 05/23/2018 15:17     Labs:   Basic Metabolic Panel: Recent Labs  Lab 05/22/18 1205 05/23/18 0524 05/24/18 0455 05/25/18 0601  NA 137 141 139 139  K 4.1 4.7 4.9 4.8  CL 101 105 104 105  CO2 25 27 26 25   GLUCOSE 116* 126* 113* 122*  BUN 20 16 17 20   CREATININE 1.02* 0.94 1.07* 1.07*  CALCIUM 9.1 8.6* 8.7* 9.1  MG  --  2.2  --   --    GFR Estimated Creatinine Clearance: 49.9 mL/min (A) (by C-G formula based on SCr of 1.07 mg/dL (H)). Liver Function Tests: Recent Labs  Lab 05/22/18 1646  AST 26  ALT 31  ALKPHOS 60  BILITOT 1.7*  PROT 6.8  ALBUMIN 3.4*   No results for input(s): LIPASE, AMYLASE in the last 168 hours. No results for input(s): AMMONIA in the last 168 hours. Coagulation profile Recent Labs  Lab 05/23/18 0655  INR 1.29    CBC: Recent Labs  Lab 05/22/18 1205 05/22/18 2151 05/23/18 0524 05/24/18 0455 05/25/18 0601  WBC 8.5 7.2 7.3 8.5 7.4  HGB 7.6* 6.5* 9.0* 9.7* 9.9*  HCT 23.4* 19.4* 27.4* 29.8* 30.0*  MCV 99.2 96.5 93.8 94.6 95.5  PLT 685* 546* 573* 618* 483*  Cardiac Enzymes: No results for input(s): CKTOTAL, CKMB, CKMBINDEX, TROPONINI in the last 168 hours. BNP: Invalid input(s): POCBNP CBG: No results for input(s): GLUCAP in the last 168 hours. D-Dimer No results for input(s): DDIMER in the last 72 hours. Hgb A1c No results for input(s): HGBA1C in the last 72 hours. Lipid Profile No results for input(s): CHOL, HDL, LDLCALC, TRIG, CHOLHDL, LDLDIRECT in the last 72 hours. Thyroid function studies Recent Labs    05/22/18 1646  TSH 1.626   Anemia work up Recent Labs      05/22/18 1646  VITAMINB12 373  FOLATE 13.3  FERRITIN 845*  TIBC 316  IRON 94  RETICCTPCT 3.0   Microbiology No results found for this or any previous visit (from the past 240 hour(s)).   Discharge Instructions:   Discharge Instructions    Ambulatory referral to Hematology   Complete by:  As directed    Follow up of BMB and other labs for anemia- ? PET scan   Diet general   Complete by:  As directed    Discharge instructions   Complete by:  As directed    Monitor BP At home-- may need to increase back your metoprolol   Increase activity slowly   Complete by:  As directed      Allergies as of 05/25/2018      Reactions   Sulfonamide Derivatives Anaphylaxis   Lipitor [atorvastatin] Other (See Comments)   Caused bladder infection   Pravachol [pravastatin Sodium] Other (See Comments)   Myalgias   Statins Other (See Comments)   Myalgia      Medication List    TAKE these medications   acetaminophen 500 MG tablet Commonly known as:  TYLENOL Take 1,000 mg by mouth as needed for mild pain or headache.   cholecalciferol 1000 units tablet Commonly known as:  VITAMIN D Take 1,000 Units by mouth daily.   fenofibrate 160 MG tablet Take 1 tablet (160 mg total) by mouth daily.   metoprolol tartrate 25 MG tablet Commonly known as:  LOPRESSOR Take 0.5 tablets (12.5 mg total) by mouth 2 (two) times daily. What changed:  how much to take   rivaroxaban 20 MG Tabs tablet Commonly known as:  XARELTO Take 1 tablet (20 mg total) by mouth daily.      Follow-up Information    Chesley Noon, MD Follow up in 1 week(s).   Specialty:  Family Medicine Contact information: [place] [address] [phone]        Brunetta Genera, MD Follow up.   Specialties:  Hematology, Oncology Why:  referral has been made-- will call with appointment Contact information: [place] [address] [phone]            Time  coordinating discharge: 35 min  Signed:  Geradine Girt  [facility] 05/25/2018, 11:32 AM

## 2018-05-25 NOTE — Progress Notes (Signed)
Nsg Discharge Note  Admit Date:  05/22/2018 Discharge date: 05/25/2018   SATCHA STORLIE to be D/C'd Home per MD order.  AVS completed.  Copy for chart, and copy for patient signed, and dated. Patient/caregiver able to verbalize understanding.  Discharge Medication: Allergies as of 05/25/2018      Reactions   Sulfonamide Derivatives Anaphylaxis   Lipitor [atorvastatin] Other (See Comments)   Caused bladder infection   Pravachol [pravastatin Sodium] Other (See Comments)   Myalgias   Statins Other (See Comments)   Myalgia      Medication List    TAKE these medications   acetaminophen 500 MG tablet Commonly known as:  TYLENOL Take 1,000 mg by mouth as needed for mild pain or headache.   cholecalciferol 1000 units tablet Commonly known as:  VITAMIN D Take 1,000 Units by mouth daily.   fenofibrate 160 MG tablet Take 1 tablet (160 mg total) by mouth daily.   metoprolol tartrate 25 MG tablet Commonly known as:  LOPRESSOR Take 0.5 tablets (12.5 mg total) by mouth 2 (two) times daily. What changed:  how much to take   rivaroxaban 20 MG Tabs tablet Commonly known as:  XARELTO Take 1 tablet (20 mg total) by mouth daily.       Discharge Assessment: Vitals:   05/25/18 0537 05/25/18 0944  BP: (!) 101/59 (!) 103/56  Pulse: 81 82  Resp: 18   Temp: 98.7 F (37.1 C)   SpO2: 98%    Skin clean, dry and intact without evidence of skin break down, no evidence of skin tears noted. IV catheter discontinued intact. Site without signs and symptoms of complications - no redness or edema noted at insertion site, patient denies c/o pain - only slight tenderness at site.  Dressing with slight pressure applied.  D/c Instructions-Education: Discharge instructions given to patient/family with verbalized understanding. D/c education completed with patient/family including follow up instructions, medication list, d/c activities limitations if indicated, with other d/c instructions as indicated by MD  - patient able to verbalize understanding, all questions fully answered. Patient instructed to return to ED, call 911, or call MD for any changes in condition.  Patient escorted via [place], and D/C home via private auto.  [place] N Yashica Sterbenz, RN 05/25/2018 1:57 PM

## 2018-05-25 NOTE — Telephone Encounter (Signed)
Called and left the pt a vm with the appt date and time for a hospital follow up with Dr. Irene Limbo on 8/13 at 320pm. Letter mailed.

## 2018-05-29 NOTE — Progress Notes (Signed)
HEMATOLOGY/ONCOLOGY CONSULTATION NOTE  Date of Service: 05/30/2018  Patient Care Team: Chesley Noon, MD as PCP - General (Family Medicine)  CHIEF COMPLAINTS/PURPOSE OF CONSULTATION:  Symptomatic anemia   HISTORY OF PRESENTING ILLNESS:   Alexandra Pierce is a wonderful 72 y.o. female who has been referred to [place] by Dr. Eliseo Squires for evaluation and management of symptomatic anemia.   Patient has history of hypertension, dyslipidemia, atrial fibrillation on anticoagulation with previous history of CVA x2, obesity who presented with generalized weakness.  Prior to admission she had an episode of blurring of vision especially in the right eye associated with a spell of confusion and possible loss of consciousness for a few seconds associated with disorientation.  She notes that she has also been feeling progressively fatigued for a few weeks and has had difficulty with dyspnea on exertion with short distances. Her daughters insisted she get evaluated and so she came to the emergency room for further evaluation. She does follow-up with cardiology and is being evaluated by pulmonary at the end of the month as well.  Patient previously has been on Coumadin from 2000 but was switched to Xarelto several years ago.  She notes no overt evidence of black stools or bleeding in the stools, no hematuria no nosebleeds no gum bleeds. Notes that she has been generally eating okay. Does endorse new night sweats over the last month or 2. No abdominal pain or significant change in bowel habits.  In the emergency room the patient had labs which showed hemoglobin of 6.6.5 with an MCV of 96.5.  Normal WBC count of 7.2k with elevated platelets of 685k which on repeat were down to 573k. Reticulocyte counts were relatively suppressed. LDH was elevated to 517 but haptoglobin was within normal limits at 45. Coombs negative Myeloma panel showed no M spike Ferritin level was elevated to 845 suggestive of acute phase  reaction/inflammation. B12 and folate within normal limits.  Patient received 2 units of PRBCs with an appropriate bump in her hemoglobin level and felt much better. CT chest abdomen pelvis was done which showed mild splenomegaly without a focal splenic lesion.  Multiple hepatic cysts.  Abnormal hypoenhancement in the right kidney lower pole of undetermined etiology -broad differential based on radiology input. right perirenal stranding is associated with a small amount of fluid in the adjacent right paracolic gutter, and a small fluid collection along the inferior margin of the liver which could be an exophytic cyst, small complex fluid collection, or conceivably a tumor nodule.  No other significant other lymphadenopathy noted.  Patient has subsequently had a bone marrow examination with the results currently pending   Interval History:   Alexandra Pierce returns today regarding her Symptomatic anemia. The patient's last visit with [place] was on 05/23/18. She is accompanied today by a friend. The pt reports that she is doing well overall.   The pt reports that she has not had repeated spells of dizziness or light headedness since her discharge unless she gets up too quickly. She denies any repeated abdominal pains. She notes that her back hurts a little bit, possibly from the BM Bx, and notes that otherwise she does not have concerns. She notes that her urine has become relatively lighter, and has been dark, and notes some small discomfort passing urine. The pt has not yet seen a urologist, as was previously considered while she was an inpatient.   Lab results (05/25/18) of CBC w/diff, BMP is as follows: all  are WNL except for RBC at 3.14, HGB at 9.9, HCT at 30.0, RDW at 16.6, PLT at 483k Glucose at 122, Creatinine at 1.07, GFR at 50. ° °On review of systems, pt reports some urinary discomfort, dark urine, and denies fevers, chills, night sweats, light headedness, dizziness, leg swelling, abdominal  pains, and any other symptoms.  ° °MEDICAL HISTORY:  °Past Medical History:  °Diagnosis Date  °• Chronic lower back pain   °• Hypercholesteremia   °• Hypertension   ° mild  °• Obesity   °• Paroxysmal atrial fibrillation (HCC)   ° managed with anticoagulation and rate control.   °• Stroke (HCC) 2001 & 2015  ° "right side gets weak sometimes if I'm only tired" (03/16/2016)  ° ° °SURGICAL HISTORY: °Past Surgical History:  °Procedure Laterality Date  °• CARDIAC CATHETERIZATION  03/31/2000  ° EF 49%/LAD lg vessel which coursed to the apex & gave rise to 2 diagonal branches/ LAD noted to have 30% ostial lesion & tapers to a sm vessel toward the apex but no high grade lesion/1st diagonal med sized vessel & 2nd diagonal sm vessel with no significant disease/ lt ventriculogram reveals mild global hypokinesis w/ an estimated EF of 45-50%  °• CARDIAC CATHETERIZATION N/A 03/17/2016  ° Procedure: Left Heart Cath and Coronary Angiography;  Surgeon: Muhammad A Arida, MD;  Location: [facility];  Service: Cardiovascular;  Laterality: N/A;  °• LEFT HEART CATH AND CORONARY ANGIOGRAPHY N/A 03/31/2018  ° Procedure: LEFT HEART CATH AND CORONARY ANGIOGRAPHY;  Surgeon: Jordan, Peter M, MD;  Location: [facility];  Service: Cardiovascular;  Laterality: N/A;  ° ° °SOCIAL HISTORY: °Social History  ° °Socioeconomic History  °• Marital status: Married  °  Spouse name: Not on file  °• Number of children: 2  °• Years of education: Not on file  °• Highest education level: Not on file  °Occupational History  °• Occupation: legal assistant  °Social Needs  °• Financial resource strain: Not on file  °• Food insecurity:  °  Worry: Not on file  °  Inability: Not on file  °• Transportation needs:  °  Medical: Not on file  °  Non-medical: Not on file  °Tobacco Use  °• Smoking status: Former Smoker  °  Packs/day: 0.12  °  Years: 15.00  °  Pack years: 1.80  °  Types: Cigarettes  °  Last attempt to quit: 10/18/1978  °  Years since quitting: 39.6    °• Smokeless tobacco: Never Used  °Substance and Sexual Activity  °• Alcohol use: Yes  °  Alcohol/week: 14.0 standard drinks  °  Types: 14 Glasses of wine per week  °• Drug use: No  °• Sexual activity: Yes  °Lifestyle  °• Physical activity:  °  Days per week: Not on file  °  Minutes per session: Not on file  °• Stress: Not on file  °Relationships  °• Social connections:  °  Talks on phone: Not on file  °  Gets together: Not on file  °  Attends religious service: Not on file  °  Active member of club or organization: Not on file  °  Attends meetings of clubs or organizations: Not on file  °  Relationship status: Not on file  °• Intimate partner violence:  °  Fear of current or ex partner: Not on file  °  Emotionally abused: Not on file  °  Physically abused: Not on file  °  Forced   Forced sexual activity: Not on file  Other Topics Concern   Not on file  Social History Narrative   Not on file    FAMILY HISTORY: Family History  Problem Relation Age of Onset   Heart disease Father    Asthma Father    Diabetes Father    Heart disease Mother    Heart disease Brother    Hyperlipidemia Sister    Hyperlipidemia Brother    Hyperlipidemia Brother    Cancer Neg Hx     ALLERGIES:  is allergic to sulfonamide derivatives; lipitor [atorvastatin]; pravachol [pravastatin sodium]; and statins.  MEDICATIONS:  Current Outpatient Medications  Medication Sig Dispense Refill   acetaminophen (TYLENOL) 500 MG tablet Take 1,000 mg by mouth as needed for mild pain or headache.      cholecalciferol (VITAMIN D) 1000 UNITS tablet Take 1,000 Units by mouth daily.     fenofibrate 160 MG tablet Take 1 tablet (160 mg total) by mouth daily. 90 tablet 3   metoprolol tartrate (LOPRESSOR) 25 MG tablet Take 0.5 tablets (12.5 mg total) by mouth 2 (two) times daily. 180 tablet 3   rivaroxaban (XARELTO) 20 MG TABS tablet Take 1 tablet (20 mg total) by mouth daily. 30 tablet 11   No current facility-administered  medications for this visit.     REVIEW OF SYSTEMS:    A 10+ POINT REVIEW OF SYSTEMS WAS OBTAINED including neurology, dermatology, psychiatry, cardiac, respiratory, lymph, extremities, GI, GU, Musculoskeletal, constitutional, breasts, reproductive, HEENT.  All pertinent positives are noted in the HPI.  All others are negative.   PHYSICAL EXAMINATION: ECOG PERFORMANCE STATUS: 1 - Symptomatic but completely ambulatory  Vitals:   05/30/18 1601  BP: 113/79  Pulse: 77  Resp: 18  Temp: 98.5 F (36.9 C)  SpO2: 99%   Filed Weights   05/30/18 1601  Weight: 173 lb 9.6 oz (78.7 kg)   .Body mass index is 28.02 kg/m.  GENERAL:alert, in no acute distress and comfortable SKIN: no acute rashes, no significant lesions EYES: conjunctiva are pink and non-injected, sclera anicteric OROPHARYNX: MMM, no exudates, no oropharyngeal erythema or ulceration NECK: supple, no JVD LYMPH:  no palpable lymphadenopathy in the cervical, axillary or inguinal regions LUNGS: clear to auscultation b/l with normal respiratory effort HEART: irregular S1S2 ABDOMEN:  normoactive bowel sounds , non tender, not distended. No palpable hepatosplenomegaly.  Extremity: trace pedal edema PSYCH: alert & oriented x 3 with fluent speech NEURO: no focal motor/sensory deficits    LABORATORY DATA:  I have reviewed the data as listed  . CBC Latest Ref Rng & Units 05/25/2018 05/24/2018 05/23/2018  WBC 4.0 - 10.5 K/uL 7.4 8.5 7.3  Hemoglobin 12.0 - 15.0 g/dL 9.9(L) 9.7(L) 9.0(L)  Hematocrit 36.0 - 46.0 % 30.0(L) 29.8(L) 27.4(L)  Platelets 150 - 400 K/uL 483(H) 618(H) 573(H)    . CMP Latest Ref Rng & Units 05/25/2018 05/24/2018 05/23/2018  Glucose 70 - 99 mg/dL 122(H) 113(H) 126(H)  BUN 8 - 23 mg/dL _0 Creatinine 0.44 - 1.00 mg/dL 1.07(H) 1.07(H) 0.94  Sodium 135 - 145 mmol/L 139 139 141  Potassium 3.5 - 5.1 mmol/L 4.8 4.9 4.7  Chloride 98 - 111 mmol/L 105 104 105  CO2 22 - 32 mmol/L _1 Calcium 8.9 - 10.3 mg/dL  9.1 8.7(L) 8.6(L)  Total Protein 6.5 - 8.1 g/dL - - -  Total Bilirubin 0.3 - 1.2 mg/dL - - -  Alkaline Phos 38 - 126 U/L - - -  AST 15 - 41 U/L - - -  ALT 0 - 44 U/L - - -   05/24/18 BM Bx:   05/24/18 BM Flow cytometry:     RADIOGRAPHIC STUDIES: I have personally reviewed the radiological images as listed and agreed with the findings in the report. Ct Chest W Contrast  Addendum Date: 05/23/2018   ADDENDUM REPORT: 05/23/2018 15:21 ADDENDUM: These results were called by telephone at the time of interpretation on 05/23/2018 at 3:21 pm to Dr. Eulogio Bear , who verbally acknowledged these results. Electronically Signed   By: Van Clines M.D.   On: 05/23/2018 15:21   Result Date: 05/23/2018 CLINICAL DATA:  Anemia, weakness EXAM: CT CHEST, ABDOMEN, AND PELVIS WITH CONTRAST TECHNIQUE: Multidetector CT imaging of the chest, abdomen and pelvis was performed following the standard protocol during bolus administration of intravenous contrast. CONTRAST:  32m OMNIPAQUE IOHEXOL 300 MG/ML  SOLN COMPARISON:  Chest radiograph 04/11/2018 FINDINGS: CT CHEST FINDINGS Cardiovascular: Coronary, aortic arch, and branch vessel atherosclerotic vascular disease. Mediastinum/Nodes: Unremarkable Lungs/Pleura: Biapical pleuroparenchymal scarring. Old granulomatous disease with calcified granulomas most notably in a right peribronchovascular distribution. Paraseptal emphysema. Musculoskeletal: Mild thoracic spondylosis. Small sebaceous cyst or similar likely benign lesion along the right paracentral back at the T12 vertebral level. CT ABDOMEN PELVIS FINDINGS Hepatobiliary: Various hypodense hepatic lesions are present, the smaller lesions are technically too small to characterize but the larger lesions are fluid density and compatible with cysts. The largest is in the lateral segment left hepatic lobe measuring 6.1 by 5.1 cm with internal density of 6 Hounsfield units. The gallbladder is contracted. Cholelithiasis is present  with nitrogen gas phenomenon within at least 1 gallstone. The common hepatic duct measures up to 1.2 cm in diameter, with the common bile duct at 0.9 cm. Pancreas: Unremarkable Spleen: The spleen measures 14.3 by 5.6 by 14.2 cm (volume = 600 cm^3) and is slightly heterogeneous on the late arterial phase images but homogeneous on the delayed images. Adrenals/Urinary Tract: Both adrenal glands appear normal. There is an infiltrative process in the lower half of the right kidney with abnormal hypoenhancement and heterogeneity of enhancement of the involved renal parenchyma, and indistinctness of the cortical margin with surrounding stranding. Infiltrative process or perirenal fluid tracks along the capsular margin of the kidney on image 23/8. Possibilities include pyelonephritis (and early renal abscess is difficult to exclude), renal infarct, and infiltrative malignancy such as renal involvement by lymphoma. No hydronephrosis or hydroureter. No appreciable urinary tract calculi. Stomach/Bowel: Unremarkable Vascular/Lymphatic: Aortoiliac atherosclerotic vascular disease. No pathologic retroperitoneal adenopathy is identified. Reproductive: Retroverted uterus, with right fundal calcification likely representing a calcified fibroid. Adnexa unremarkable. Other: Perirenal stranding on the right with some adjacent fluid in the right paracolic gutter. There is a 1.1 by 1.1 cm low-density nodule along the inferior margin of the right hepatic lobe which could represent complex fluid, tumor nodule, exophytic hepatic cyst, or a small abscess. Small amount of free pelvic fluid eccentric to the right. Musculoskeletal: Dextroconvex lumbar scoliosis with rotary component. Lumbar spondylosis and degenerative disc disease causing mild right foraminal impingement at L4-5. IMPRESSION: 1. Abnormal hypoenhancement in the parenchyma of the right kidney lower pole compatible with infiltrative process, possibilities include pyelonephritis  (early renal abscess difficult to exclude), renal infarct, and infiltrative malignancy such as renal lymphoma. Correlate with urine analysis. There is potentially some fluid tracking along the right renal capsular margin. 2. Mild splenomegaly without definite splenic focal lesion. Splenomegaly is nonspecific but could be a corroborative finding of  3. Scattered hypodense hepatic lesions are likely cysts, although the smaller lesions are technically nonspecific. 4. The right perirenal stranding is associated with a small amount of fluid in the adjacent right paracolic gutter, and a small fluid collection along the inferior margin of the liver which could be an exophytic cyst, small complex fluid collection, or conceivably a tumor nodule. 5. Other imaging findings of potential clinical significance: Aortic Atherosclerosis (ICD10-I70.0). Coronary atherosclerosis. Emphysema (ICD10-J43.9). Old granulomatous disease. Calcified uterine fibroid. Right foraminal impingement at L4-5. Cholelithiasis. Mild extrahepatic biliary dilatation. Radiology assistant personnel have been notified to put me in telephone contact with the referring physician or the referring physician's clinical representative in order to discuss these findings. Once this communication is established I will issue an addendum to this report for documentation purposes. Electronically Signed: By: Walter  Liebkemann M.D. On: 05/23/2018 15:17  ° °Ct Abdomen Pelvis W Contrast ° °Addendum Date: 05/23/2018   °ADDENDUM REPORT: 05/23/2018 15:21 ADDENDUM: These results were called by telephone at the time of interpretation on 05/23/2018 at 3:21 pm to Dr. JESSICA VANN , who verbally acknowledged these results. Electronically Signed   By: Walter  Liebkemann M.D.   On: 05/23/2018 15:21  ° °Result Date: 05/23/2018 °CLINICAL DATA:  Anemia, weakness EXAM: CT CHEST, ABDOMEN, AND PELVIS WITH CONTRAST TECHNIQUE: Multidetector CT imaging of the chest, abdomen and pelvis was  performed following the standard protocol during bolus administration of intravenous contrast. CONTRAST:  75mL OMNIPAQUE IOHEXOL 300 MG/ML  SOLN COMPARISON:  Chest radiograph 04/11/2018 FINDINGS: CT CHEST FINDINGS Cardiovascular: Coronary, aortic arch, and branch vessel atherosclerotic vascular disease. Mediastinum/Nodes: Unremarkable Lungs/Pleura: Biapical pleuroparenchymal scarring. Old granulomatous disease with calcified granulomas most notably in a right peribronchovascular distribution. Paraseptal emphysema. Musculoskeletal: Mild thoracic spondylosis. Small sebaceous cyst or similar likely benign lesion along the right paracentral back at the T12 vertebral level. CT ABDOMEN PELVIS FINDINGS Hepatobiliary: Various hypodense hepatic lesions are present, the smaller lesions are technically too small to characterize but the larger lesions are fluid density and compatible with cysts. The largest is in the lateral segment left hepatic lobe measuring 6.1 by 5.1 cm with internal density of 6 Hounsfield units. The gallbladder is contracted. Cholelithiasis is present with nitrogen gas phenomenon within at least 1 gallstone. The common hepatic duct measures up to 1.2 cm in diameter, with the common bile duct at 0.9 cm. Pancreas: Unremarkable Spleen: The spleen measures 14.3 by 5.6 by 14.2 cm (volume = 600 cm^3) and is slightly heterogeneous on the late arterial phase images but homogeneous on the delayed images. Adrenals/Urinary Tract: Both adrenal glands appear normal. There is an infiltrative process in the lower half of the right kidney with abnormal hypoenhancement and heterogeneity of enhancement of the involved renal parenchyma, and indistinctness of the cortical margin with surrounding stranding. Infiltrative process or perirenal fluid tracks along the capsular margin of the kidney on image 23/8. Possibilities include pyelonephritis (and early renal abscess is difficult to exclude), renal infarct, and infiltrative  malignancy such as renal involvement by lymphoma. No hydronephrosis or hydroureter. No appreciable urinary tract calculi. Stomach/Bowel: Unremarkable Vascular/Lymphatic: Aortoiliac atherosclerotic vascular disease. No pathologic retroperitoneal adenopathy is identified. Reproductive: Retroverted uterus, with right fundal calcification likely representing a calcified fibroid. Adnexa unremarkable. Other: Perirenal stranding on the right with some adjacent fluid in the right paracolic gutter. There is a 1.1 by 1.1 cm low-density nodule along the inferior margin of the right hepatic lobe which could represent complex fluid, tumor nodule, exophytic hepatic cyst, or a small abscess. Small   amount of free pelvic fluid eccentric to the right. Musculoskeletal: Dextroconvex lumbar scoliosis with rotary component. Lumbar spondylosis and degenerative disc disease causing mild right foraminal impingement at L4-5. IMPRESSION: 1. Abnormal hypoenhancement in the parenchyma of the right kidney lower pole compatible with infiltrative process, possibilities include pyelonephritis (early renal abscess difficult to exclude), renal infarct, and infiltrative malignancy such as renal lymphoma. Correlate with urine analysis. There is potentially some fluid tracking along the right renal capsular margin. 2. Mild splenomegaly without definite splenic focal lesion. Splenomegaly is nonspecific but could be a corroborative finding of lymphoma. 3. Scattered hypodense hepatic lesions are likely cysts, although the smaller lesions are technically nonspecific. 4. The right perirenal stranding is associated with a small amount of fluid in the adjacent right paracolic gutter, and a small fluid collection along the inferior margin of the liver which could be an exophytic cyst, small complex fluid collection, or conceivably a tumor nodule. 5. Other imaging findings of potential clinical significance: Aortic Atherosclerosis (ICD10-I70.0). Coronary  atherosclerosis. Emphysema (ICD10-J43.9). Old granulomatous disease. Calcified uterine fibroid. Right foraminal impingement at L4-5. Cholelithiasis. Mild extrahepatic biliary dilatation. Radiology assistant personnel have been notified to put me in telephone contact with the referring physician or the referring physician's clinical representative in order to discuss these findings. Once this communication is established I will issue an addendum to this report for documentation purposes. Electronically Signed: By: Walter  Liebkemann M.D. On: 05/23/2018 15:17  ° °Ct Bone Marrow Biopsy & Aspiration ° °Result Date: 05/24/2018 °INDICATION: 72-year-old with anemia workup. EXAM: CT GUIDED BONE MARROW ASPIRATES AND BIOPSY Physician: Adam R. Henn, MD MEDICATIONS: None. ANESTHESIA/SEDATION: Fentanyl 50 mcg IV; Versed 1.5 mg IV Moderate Sedation Time:  21 minutes The patient was continuously monitored during the procedure by the interventional radiology nurse under my direct supervision. COMPLICATIONS: None immediate. PROCEDURE: The procedure was explained to the patient. The risks and benefits of the procedure were discussed and the patient's questions were addressed. Informed consent was obtained from the patient. The patient was placed prone on CT table. Images of the pelvis were obtained. The left side of back was prepped and draped in sterile fashion. The skin and left posterior ilium were anesthetized with 1% lidocaine. 11 gauge bone needle was directed into the left ilium with CT guidance. Two aspirates and two core biopsies were obtained. Bandage placed over the puncture site. FINDINGS: Bone needle directed into the posterior left ilium. IMPRESSION: CT guided bone marrow aspiration and core biopsy. Electronically Signed   By: Adam  Henn M.D.   On: 05/24/2018 10:09  ° ° °ASSESSMENT & PLAN:  ° °72 y.o. female with ° °1. S/p Symptomatic macrocytic anemia. °No overt evidence of bleeding. ° °In the emergency room the patient  had labs which showed hemoglobin of 6.6.5 with an MCV of 96.5.  Normal WBC count of 7.2k with elevated platelets of 685k which on repeat were down to 573k. °Reticulocyte counts were relatively suppressed. °LDH was elevated to 517 but haptoglobin was within normal limits at 45. °Coombs negative °Myeloma panel showed no M spike °Ferritin level was elevated to 845 suggestive of acute phase reaction/inflammation. °B12 and folate within normal limits. ° °Patient received 2 units of PRBCs with an appropriate bump in her hemoglobin level and felt much better. ° °05/23/18 CT Chest Abdomen Pelvis was done which showed mild splenomegaly without a focal splenic lesion. Multiple hepatic cysts.  Abnormal hypoenhancement in the right kidney lower pole of undetermined etiology -broad differential based on radiology input.   input. right perirenal stranding is associated with a small amount of fluid in the adjacent right paracolic gutter, and a small fluid collection along the inferior margin of the liver which could be an exophytic cyst, small complex fluid collection, or conceivably a tumor nodule.  No other significant other lymphadenopathy noted.  2. Rt renal radiographic abnormality as noted in CT with some fluid collection. Unclear etiology Abdominal examination was completely benign. No fevers chills to suggest acute pyelonephritis or renal abscess. Urine analysis showed trace leuk esterase and 6-10 WBCs no significant hematuria. Broad differential diagnosis based on CT findings as per radiologist. Procalcitonin levels unimpressive  PLAN: -Acute phase reactants suggest cannot rule out malignancy or renal infarct or an infectious process. -Discussed pt labwork from 05/25/18; HGB at 9.9 after blood transfusion, PLT at 483k -Discussed the 05/24/18 BM Bx results which revealed erythroid and megakaryocytic proliferation and ring sideroblasts concerning for myeloproliferative/ myelodysplastic neoplasm -Discussed that the pt had No  excess blasts; Acute leukemia and lymphoma are not indicated -Discussed that genetic testing has been sent out from her recent BM, and is pending -Will collect additional blood tests -Will provide referral to urologist -Will order MRI of right kidney which was seen to have an infiltrative process on the 05/23/18 CT - pt does not have metal implants -Discussed that her kidney concerns are complicating the understanding or her anemia's etiology, in the context of her recent BM findings of concern for myeloproliferative/ myelodysplastic neoplasm  -Will continue to watch anemia and provide transfusion support as necessary -Will see the pt back in 2 weeks, sooner if any new concerns or symptoms    Labs tomorrow MRI kidney in 3-4 days Urology referral for renal lesion ?RCC RTC with Dr Irene Limbo in 2 weeks    All of the patients questions were answered with apparent satisfaction. The patient knows to call the clinic with any problems, questions or concerns.  The total time spent in the appt was 40 minutes and more than 50% was on counseling and direct patient cares.      Sullivan Lone MD MS AAHIVMS [facility] Hematology/Oncology Physician [facility]  (Office):       [phone] (Work cell):  [phone] (Fax):           [phone]  05/30/2018 4:52 PM  I, Baldwin [place], am acting as a scribe for Dr. Irene Limbo  .I have reviewed the above documentation for accuracy and completeness, and I agree with the above. Brunetta Genera MD

## 2018-05-30 ENCOUNTER — Inpatient Hospital Stay: Payer: Medicare Other | Attending: Hematology | Admitting: Hematology

## 2018-05-30 VITALS — BP 113/79 | HR 77 | Temp 98.5°F | Resp 18 | Ht 66.0 in | Wt 173.6 lb

## 2018-05-30 DIAGNOSIS — D75839 Thrombocytosis, unspecified: Secondary | ICD-10-CM

## 2018-05-30 DIAGNOSIS — D473 Essential (hemorrhagic) thrombocythemia: Secondary | ICD-10-CM

## 2018-05-30 DIAGNOSIS — D649 Anemia, unspecified: Secondary | ICD-10-CM | POA: Insufficient documentation

## 2018-05-30 DIAGNOSIS — R161 Splenomegaly, not elsewhere classified: Secondary | ICD-10-CM

## 2018-05-30 DIAGNOSIS — D471 Chronic myeloproliferative disease: Secondary | ICD-10-CM

## 2018-05-30 DIAGNOSIS — N289 Disorder of kidney and ureter, unspecified: Secondary | ICD-10-CM

## 2018-05-31 ENCOUNTER — Telehealth: Payer: Self-pay

## 2018-05-31 ENCOUNTER — Inpatient Hospital Stay: Payer: Medicare Other

## 2018-05-31 DIAGNOSIS — D649 Anemia, unspecified: Secondary | ICD-10-CM

## 2018-05-31 DIAGNOSIS — D75839 Thrombocytosis, unspecified: Secondary | ICD-10-CM

## 2018-05-31 DIAGNOSIS — D473 Essential (hemorrhagic) thrombocythemia: Secondary | ICD-10-CM

## 2018-05-31 LAB — CMP (CANCER CENTER ONLY)
ALT: 13 U/L (ref 0–44)
AST: 22 U/L (ref 15–41)
Albumin: 3.8 g/dL (ref 3.5–5.0)
Alkaline Phosphatase: 51 U/L (ref 38–126)
Anion gap: 11 (ref 5–15)
BUN: 30 mg/dL — AB (ref 8–23)
CALCIUM: 9 mg/dL (ref 8.9–10.3)
CHLORIDE: 105 mmol/L (ref 98–111)
CO2: 24 mmol/L (ref 22–32)
CREATININE: 1.37 mg/dL — AB (ref 0.44–1.00)
GFR, EST AFRICAN AMERICAN: 43 mL/min — AB (ref >60)
GFR, EST NON AFRICAN AMERICAN: 37 mL/min — AB (ref >60)
Glucose, Bld: 112 mg/dL — ABNORMAL HIGH (ref 70–99)
Potassium: 4 mmol/L (ref 3.5–5.1)
Sodium: 140 mmol/L (ref 135–145)
TOTAL PROTEIN: 7.3 g/dL (ref 6.5–8.1)
Total Bilirubin: 2.1 mg/dL — ABNORMAL HIGH (ref 0.3–1.2)

## 2018-05-31 LAB — CBC WITH DIFFERENTIAL/PLATELET
Basophils Absolute: 0.1 10*3/uL (ref 0.0–0.1)
Basophils Relative: 1 %
EOS ABS: 0.2 10*3/uL (ref 0.0–0.5)
EOS PCT: 2 %
HCT: 21.8 % — ABNORMAL LOW (ref 34.8–46.6)
HEMOGLOBIN: 7.2 g/dL — AB (ref 11.6–15.9)
LYMPHS ABS: 2.3 10*3/uL (ref 0.9–3.3)
Lymphocytes Relative: 27 %
MCH: 30.6 pg (ref 25.1–34.0)
MCHC: 33 g/dL (ref 31.5–36.0)
MCV: 92.8 fL (ref 79.5–101.0)
Monocytes Absolute: 0.3 10*3/uL (ref 0.1–0.9)
Monocytes Relative: 4 %
NEUTROS PCT: 66 %
Neutro Abs: 5.6 10*3/uL (ref 1.5–6.5)
PLATELETS: 492 10*3/uL — AB (ref 145–400)
RBC: 2.35 MIL/uL — AB (ref 3.70–5.45)
RDW: 17.4 % — ABNORMAL HIGH (ref 11.2–14.5)
WBC: 8.4 10*3/uL (ref 3.9–10.3)

## 2018-05-31 LAB — LACTATE DEHYDROGENASE: LDH: 549 U/L — ABNORMAL HIGH (ref 98–192)

## 2018-05-31 LAB — SAMPLE TO BLOOD BANK

## 2018-05-31 LAB — SEDIMENTATION RATE: Sed Rate: 125 mm/hr — ABNORMAL HIGH (ref 0–22)

## 2018-05-31 NOTE — Telephone Encounter (Signed)
Removed from DAR completed by MX. Per 8/13 los

## 2018-06-01 LAB — HAPTOGLOBIN

## 2018-06-02 ENCOUNTER — Other Ambulatory Visit: Payer: Self-pay | Admitting: *Deleted

## 2018-06-02 ENCOUNTER — Telehealth: Payer: Self-pay | Admitting: Medical Oncology

## 2018-06-02 DIAGNOSIS — D649 Anemia, unspecified: Secondary | ICD-10-CM

## 2018-06-02 NOTE — Telephone Encounter (Signed)
LVM for Melissa to tell pt about her blood transfusion tomorrow and to keep her blue arm band on so we can match her blood.

## 2018-06-03 ENCOUNTER — Inpatient Hospital Stay: Payer: Medicare Other

## 2018-06-03 VITALS — BP 115/54 | HR 73 | Temp 98.3°F | Resp 18

## 2018-06-03 DIAGNOSIS — D649 Anemia, unspecified: Secondary | ICD-10-CM

## 2018-06-03 MED ORDER — DIPHENHYDRAMINE HCL 25 MG PO CAPS
25.0000 mg | ORAL_CAPSULE | Freq: Once | ORAL | Status: AC
  Administered 2018-06-03: 25 mg via ORAL

## 2018-06-03 MED ORDER — SODIUM CHLORIDE 0.9% IV SOLUTION
250.0000 mL | Freq: Once | INTRAVENOUS | Status: AC
  Administered 2018-06-03: 250 mL via INTRAVENOUS
  Filled 2018-06-03: qty 250

## 2018-06-03 MED ORDER — ACETAMINOPHEN 325 MG PO TABS
650.0000 mg | ORAL_TABLET | Freq: Once | ORAL | Status: AC
  Administered 2018-06-03: 650 mg via ORAL

## 2018-06-03 MED ORDER — DIPHENHYDRAMINE HCL 25 MG PO CAPS
ORAL_CAPSULE | ORAL | Status: AC
  Filled 2018-06-03: qty 1

## 2018-06-03 MED ORDER — ACETAMINOPHEN 325 MG PO TABS
ORAL_TABLET | ORAL | Status: AC
  Filled 2018-06-03: qty 2

## 2018-06-03 NOTE — Patient Instructions (Signed)

## 2018-06-05 LAB — TYPE AND SCREEN
ABO/RH(D): A POS
Antibody Screen: NEGATIVE
UNIT DIVISION: 0
UNIT DIVISION: 0

## 2018-06-05 LAB — BPAM RBC
BLOOD PRODUCT EXPIRATION DATE: 201909092359
BLOOD PRODUCT EXPIRATION DATE: 201909092359
ISSUE DATE / TIME: 201908171034
ISSUE DATE / TIME: 201908171034
UNIT TYPE AND RH: 6200
Unit Type and Rh: 6200

## 2018-06-06 ENCOUNTER — Encounter: Payer: Self-pay | Admitting: Pulmonary Disease

## 2018-06-06 ENCOUNTER — Other Ambulatory Visit (INDEPENDENT_AMBULATORY_CARE_PROVIDER_SITE_OTHER): Payer: Medicare Other

## 2018-06-06 ENCOUNTER — Encounter (HOSPITAL_COMMUNITY): Payer: Self-pay | Admitting: Hematology

## 2018-06-06 ENCOUNTER — Ambulatory Visit: Payer: Medicare Other | Admitting: Pulmonary Disease

## 2018-06-06 VITALS — BP 104/64 | HR 90 | Ht 65.55 in | Wt 175.0 lb

## 2018-06-06 DIAGNOSIS — J300 Vasomotor rhinitis: Secondary | ICD-10-CM

## 2018-06-06 DIAGNOSIS — R0600 Dyspnea, unspecified: Secondary | ICD-10-CM

## 2018-06-06 DIAGNOSIS — D649 Anemia, unspecified: Secondary | ICD-10-CM

## 2018-06-06 DIAGNOSIS — K219 Gastro-esophageal reflux disease without esophagitis: Secondary | ICD-10-CM | POA: Diagnosis not present

## 2018-06-06 DIAGNOSIS — R05 Cough: Secondary | ICD-10-CM

## 2018-06-06 DIAGNOSIS — R059 Cough, unspecified: Secondary | ICD-10-CM

## 2018-06-06 LAB — CBC WITH DIFFERENTIAL/PLATELET
BASOS PCT: 2.2 % (ref 0.0–3.0)
Basophils Absolute: 0.2 10*3/uL — ABNORMAL HIGH (ref 0.0–0.1)
Eosinophils Absolute: 0.2 10*3/uL (ref 0.0–0.7)
Eosinophils Relative: 2.2 % (ref 0.0–5.0)
HEMATOCRIT: 29.6 % — AB (ref 36.0–46.0)
HEMOGLOBIN: 10.3 g/dL — AB (ref 12.0–15.0)
LYMPHS PCT: 29 % (ref 12.0–46.0)
Lymphs Abs: 2.4 10*3/uL (ref 0.7–4.0)
MCHC: 34.6 g/dL (ref 30.0–36.0)
MCV: 91.9 fl (ref 78.0–100.0)
MONOS PCT: 4 % (ref 3.0–12.0)
Monocytes Absolute: 0.3 10*3/uL (ref 0.1–1.0)
NEUTROS ABS: 5.2 10*3/uL (ref 1.4–7.7)
Neutrophils Relative %: 62.6 % (ref 43.0–77.0)
PLATELETS: 522 10*3/uL — AB (ref 150.0–400.0)
RBC: 3.22 Mil/uL — ABNORMAL LOW (ref 3.87–5.11)
RDW: 18.5 % — AB (ref 11.5–15.5)
WBC: 8.3 10*3/uL (ref 4.0–10.5)

## 2018-06-06 MED ORDER — IPRATROPIUM BROMIDE 0.06 % NA SOLN: 2.0000 | NASAL | 5 refills | Status: DC | PRN

## 2018-06-06 NOTE — Progress Notes (Signed)
Synopsis: Referred in Aug 2019 for dyspnea, abnormal PFT  Subjective:   PATIENT ID: Alexandra Pierce GENDER: female DOB: [birth year], MRN: [id number]   HPI  Chief Complaint  Patient presents with  . New Consult    Sharleen was referred to [place] by cardiology Truitt Merle) in the setting of dyspnea.   > she says that she can't walk anymore due to the dyspnea > worse with afib > she says that she first noticed the dyspnea about 1 year ago > she says that she can go to the grocery store but if she goes into afib that will make her feel worse > if not in afib she can make it through the grocery store > has to pace herself carrying groceries > she can climb stairs, sometimes has to stop if in a-fib > dyspnea is definitely worse in A-fib but there is still dyspnea there even when she is not in afib > she feels associate deakness when short of breath: she feels drained all over: her arms and legs will get weak and she has to sit to catch her breath > she says this is progressing  Never told she had a lung problems.  She says her father had asthma but no other family.  No childhood respiratory illnesses.  She smoked < 1 ppd for twenty years, quit smoking several decades ago.  Worked in an office environment.  No changes in her home over the last few years.  She says that when the air conditioning comes on she smells something musty.  No clear mold, mildew or new animals.    Cough: > feels like she has something stuck in her throat a lot > her heartburn and indigestion has been worse in the last year > she had some prilosec earlier  > cough comes and goes with afib  Past Medical History:  Diagnosis Date  . Chronic lower back pain   . Hypercholesteremia   . Hypertension    mild  . Obesity   . Paroxysmal atrial fibrillation (HCC)    managed with anticoagulation and rate control.   . Stroke [facility]) 2001 & 2015   "right side gets weak sometimes if I'm only tired" (03/16/2016)     Family History    Problem Relation Age of Onset  . Heart disease Father   . Asthma Father   . Diabetes Father   . Heart disease Mother   . Heart disease Brother   . Hyperlipidemia Sister   . Hyperlipidemia Brother   . Hyperlipidemia Brother   . Cancer Neg Hx      Social History   Socioeconomic History  . Marital status: Married    Spouse name: Not on file  . Number of children: 2  . Years of education: Not on file  . Highest education level: Not on file  Occupational History  . Occupation: Herbalist  Social Needs  . Financial resource strain: Not on file  . Food insecurity:    Worry: Not on file    Inability: Not on file  . Transportation needs:    Medical: Not on file    Non-medical: Not on file  Tobacco Use  . Smoking status: Former Smoker    Packs/day: 0.12    Years: 15.00    Pack years: 1.80    Types: Cigarettes    Last attempt to quit: 10/18/1978    Years since quitting: 39.6  . Smokeless tobacco: Never Used  Substance and Sexual Activity  .  Alcohol use: Yes    Alcohol/week: 14.0 standard drinks    Types: 14 Glasses of wine per week  . Drug use: No  . Sexual activity: Yes  Lifestyle  . Physical activity:    Days per week: Not on file    Minutes per session: Not on file  . Stress: Not on file  Relationships  . Social connections:    Talks on phone: Not on file    Gets together: Not on file    Attends religious service: Not on file    Active member of club or organization: Not on file    Attends meetings of clubs or organizations: Not on file    Relationship status: Not on file  . Intimate partner violence:    Fear of current or ex partner: Not on file    Emotionally abused: Not on file    Physically abused: Not on file    Forced sexual activity: Not on file  Other Topics Concern  . Not on file  Social History Narrative  . Not on file     Allergies  Allergen Reactions  . Sulfonamide Derivatives Anaphylaxis  . Lipitor [Atorvastatin] Other (See Comments)     Caused bladder infection  . Pravachol [Pravastatin Sodium] Other (See Comments)    Myalgias  . Statins Other (See Comments)    Myalgia     Outpatient Medications Prior to Visit  Medication Sig Dispense Refill  . acetaminophen (TYLENOL) 500 MG tablet Take 1,000 mg by mouth as needed for mild pain or headache.     . cholecalciferol (VITAMIN D) 1000 UNITS tablet Take 1,000 Units by mouth daily.    . fenofibrate 160 MG tablet Take 1 tablet (160 mg total) by mouth daily. 90 tablet 3  . metoprolol tartrate (LOPRESSOR) 25 MG tablet Take 0.5 tablets (12.5 mg total) by mouth 2 (two) times daily. 180 tablet 3  . rivaroxaban (XARELTO) 20 MG TABS tablet Take 1 tablet (20 mg total) by mouth daily. 30 tablet 11   No facility-administered medications prior to visit.     Review of Systems  Constitutional: Negative for fever and weight loss.  HENT: Positive for congestion. Negative for ear pain, nosebleeds and sore throat.   Eyes: Negative for redness.  Respiratory: Positive for cough and shortness of breath. Negative for wheezing.   Cardiovascular: Positive for palpitations. Negative for leg swelling and PND.  Gastrointestinal: Negative for nausea and vomiting.  Genitourinary: Negative for dysuria.  Skin: Negative for rash.  Neurological: Negative for headaches.  Endo/Heme/Allergies: Bruises/bleeds easily.  Psychiatric/Behavioral: Negative for depression. The patient is not nervous/anxious.       Objective:  Physical Exam   Vitals:   06/06/18 1525  BP: 104/64  Pulse: 90  SpO2: 98%  Weight: 175 lb (79.4 kg)  Height: 5' 5.55" (1.665 m)    Gen: well appearing, no acute distress HENT: NCAT, OP clear, neck supple without masses Eyes: PERRL, EOMi Lymph: no cervical lymphadenopathy PULM: Few crackles R base, cleared with cough CV: RRR, no mgr, no JVD GI: BS+, soft, nontender, no hsm Derm: no rash or skin breakdown MSK: normal bulk and tone Neuro: A&Ox4, CN II-XII intact, strength  5/5 in all 4 extremities Psyche: normal mood and affect   CBC    Component Value Date/Time   WBC 8.4 05/31/2018 1432   RBC 2.35 (L) 05/31/2018 1432   HGB 7.2 (L) 05/31/2018 1432   HGB 11.4 03/27/2018 1012   HCT 21.8 (L) 05/31/2018 1432  HCT 33.2 (L) 03/27/2018 1012   PLT 492 (H) 05/31/2018 1432   PLT 417 03/27/2018 1012   MCV 92.8 05/31/2018 1432   MCV 96 03/27/2018 1012   MCH 30.6 05/31/2018 1432   MCHC 33.0 05/31/2018 1432   RDW 17.4 (H) 05/31/2018 1432   RDW 14.5 03/27/2018 1012   LYMPHSABS 2.3 05/31/2018 1432   MONOABS 0.3 05/31/2018 1432   EOSABS 0.2 05/31/2018 1432   BASOSABS 0.1 05/31/2018 1432     Chest imaging: August 2019 CT chest images independently reviewed showing some mosaicism, nonspecific reticulation along the periphery of the lungs in a dependent fashion, some cysts, question emphysema, one focal area of bronchiolectasis in the right middle lobe  PFT: July 2019 pulmonary function testing: Ratio normal, FEV1 2.44 L 103% predicted, total lung capacity 6.1 mL 113% predicted, DLCO 14.2 652% predicted  Labs: 2019 hemoglobin values have ranged from 6.5-11.4  Path:  Echo: 2017 echocardiogram: No wall motion abnormalities, mild mitral regurgitation, left atrium moderately dilated, PA systolic pressure 32 mmHg  Heart Catheterization:  Records from her hematology visits earlier this year reviewed where there is concern for myelodysplastic versus myelofibrotic disease.     Assessment & Plan:   Dyspnea, unspecified type - Plan: CBC with Differential/Platelet  Anemia, unspecified type - Plan: CBC with Differential/Platelet  Vasomotor rhinitis  Gastroesophageal reflux disease without esophagitis  Cough  Discussion: K comes to my clinic today for evaluation of dyspnea and cough.  I am hopeful that the cough is due to an upper airway problem as she describes both postnasal drip as well as acid reflux and most of her symptoms seem to be upper airway in  nature.  In the setting of her dyspnea she had a markedly low diffusion capacity on her lung function testing.  She is got a number of abnormalities on the CT scan of her chest as well but none of these seem to be very severe so it is difficult for me to say that there is clearly a lung problem causing her dyspnea.  Interestingly, the diffusion capacity was not corrected for her remarkably low hemoglobin this year.  So, I suspect that the diffusion capacity would have been much closer to normal had this been accounted for when the technicians performed a lung function test earlier this year.  I believe that her anemia is the predominant cause of dyspnea.  Plan: Shortness of breath: I think this is predominantly due to the anemia, though because you have some mild abnormalities on lung function testing and a CT scan of your chest we will continue to follow you.  If you develop worsening shortness of breath or other respiratory symptoms such as cough, wheezing, or mucus production please let us know. I would like to repeat a lung function test in about 6 months but we do not need to do that right now Ideally, I would like to repeat the lung function test when your hemoglobin is normal  Anemia: Continue follow-up with the hematology clinic  Cough: I believe this is due to postnasal drip as well as acid reflux You need to try to suppress your cough to allow your larynx (voice box) to heal.  For three days don't talk, laugh, sing, or clear your throat. Do everything you can to suppress the cough during this time. Use hard candies (sugarless Jolly Ranchers) or non-mint or non-menthol containing cough drops during this time to soothe your throat.  Use a cough suppressant (Delsym or what I have  prescribed you) around the clock during this time.  After three days, gradually increase the use of your voice and back off on the cough suppressants.   Acid reflux: Try taking Prilosec every day for a  month  Vasomotor rhinitis: Take ipratropium nose spray 2 sprays every 4-6 hours as needed for the drip  We will see you back in 1 month or sooner if needed    Current Outpatient Medications:  .  acetaminophen (TYLENOL) 500 MG tablet, Take 1,000 mg by mouth as needed for mild pain or headache. , Disp: , Rfl:  .  cholecalciferol (VITAMIN D) 1000 UNITS tablet, Take 1,000 Units by mouth daily., Disp: , Rfl:  .  fenofibrate 160 MG tablet, Take 1 tablet (160 mg total) by mouth daily., Disp: 90 tablet, Rfl: 3 .  metoprolol tartrate (LOPRESSOR) 25 MG tablet, Take 0.5 tablets (12.5 mg total) by mouth 2 (two) times daily., Disp: 180 tablet, Rfl: 3 .  rivaroxaban (XARELTO) 20 MG TABS tablet, Take 1 tablet (20 mg total) by mouth daily., Disp: 30 tablet, Rfl: 11 .  ipratropium (ATROVENT) 0.06 % nasal spray, Place 2 sprays into both nostrils every 4 (four) hours as needed for rhinitis., Disp: 15 mL, Rfl: 5

## 2018-06-06 NOTE — Patient Instructions (Signed)
Shortness of breath: I think this is predominantly due to the anemia, though because you have some mild abnormalities on lung function testing and a CT scan of your chest we will continue to follow you.  If you develop worsening shortness of breath or other respiratory symptoms such as cough, wheezing, or mucus production please let us know. I would like to repeat a lung function test in about 6 months but we do not need to do that right now Ideally, I would like to repeat the lung function test when your hemoglobin is normal  Anemia: Continue follow-up with the hematology clinic  Cough: I believe this is due to postnasal drip as well as acid reflux You need to try to suppress your cough to allow your larynx (voice box) to heal.  For three days don't talk, laugh, sing, or clear your throat. Do everything you can to suppress the cough during this time. Use hard candies (sugarless Jolly Ranchers) or non-mint or non-menthol containing cough drops during this time to soothe your throat.  Use a cough suppressant (Delsym or what I have prescribed you) around the clock during this time.  After three days, gradually increase the use of your voice and back off on the cough suppressants.   Acid reflux: Try taking Prilosec every day for a month  Vasomotor rhinitis: Take ipratropium nose spray 2 sprays every 4-6 hours as needed for the drip  We will see you back in 1 month or sooner if needed

## 2018-06-07 LAB — JAK2 (INCLUDING V617F AND EXON 12), MPL,& CALR W/RFL MPN PANEL (NGS)

## 2018-06-08 NOTE — Progress Notes (Signed)
Late entry for June 03, 2018.  1050: Blood cooler received held note "patient has ANTI - I.  Blood warmer required.  1102 am: [facility] charge nurse [phone]),requested blood warmer, nurse en route to [facility] . 1128: First unit blood hung upon warmer set up with patient unit verification.  Warming unit distal patient end secured to pt.'s rt antecubital IV site by Cecille Rubin Bunton,LPN.  Patient tolerated well.  1148:   Alexandra Pierce has begun to clear throat while.  at this time noted increase in frequency clearing with grunting sound.  Denies distress or trouble breathing.  Regular respirations.  VSS.  "I don't know, I may have seasonal allergies or something going on."   Drinking water well.    1233 pm: B/P = 99/50; denies and no signs of distress.  1339 pm: Rechecked B/P = 92/36, P = 68.  No distress noted.  "No, I am not dizzy.  I am okay.  Well I was dizzy briefly when I got up at home earlier but not here.  Did not last or bother me at all.  I took B/P medications before I came in."   1424: VSS at 1413  Second blood unit verified.   1655: Alexandra Pierce reports she's called friend for pick-up.  Expression changed with changing position to stand.  One hand assistance to help Alexandra Pierce stand.  Slight wobble noted.  Declined assistance to restroom stating "I live only five minutes away". Walked along side patient, increased steadiness noted.  Seated awaiting ride home.  [place] with wait.  No distress noted.

## 2018-06-10 ENCOUNTER — Ambulatory Visit (HOSPITAL_COMMUNITY)
Admission: RE | Admit: 2018-06-10 | Discharge: 2018-06-10 | Disposition: A | Discharge status: Home / Self Care | Payer: Medicare Other | Source: Ambulatory Visit | Attending: Hematology | Admitting: Hematology

## 2018-06-10 DIAGNOSIS — K8020 Calculus of gallbladder without cholecystitis without obstruction: Secondary | ICD-10-CM | POA: Insufficient documentation

## 2018-06-10 DIAGNOSIS — N280 Ischemia and infarction of kidney: Secondary | ICD-10-CM | POA: Diagnosis not present

## 2018-06-10 DIAGNOSIS — N289 Disorder of kidney and ureter, unspecified: Secondary | ICD-10-CM | POA: Diagnosis not present

## 2018-06-10 MED ORDER — GADOBENATE DIMEGLUMINE 529 MG/ML IV SOLN
20.0000 mL | Freq: Once | INTRAVENOUS | Status: AC | PRN
  Administered 2018-06-10: 20 mL via INTRAVENOUS

## 2018-06-21 NOTE — Progress Notes (Signed)
HEMATOLOGY/ONCOLOGY CONSULTATION NOTE  Date of Service: 06/22/2018  Patient Care Team: Chesley Noon, MD as PCP - General (Family Medicine)  CHIEF COMPLAINTS/PURPOSE OF CONSULTATION:  Symptomatic anemia   HISTORY OF PRESENTING ILLNESS:   Alexandra Pierce is a wonderful 72 y.o. female who has been referred to [place] by Dr. Eliseo Squires for evaluation and management of symptomatic anemia.   Patient has history of hypertension, dyslipidemia, atrial fibrillation on anticoagulation with previous history of CVA x2, obesity who presented with generalized weakness.  Prior to admission she had an episode of blurring of vision especially in the right eye associated with a spell of confusion and possible loss of consciousness for a few seconds associated with disorientation.  She notes that she has also been feeling progressively fatigued for a few weeks and has had difficulty with dyspnea on exertion with short distances. Her daughters insisted she get evaluated and so she came to the emergency room for further evaluation. She does follow-up with cardiology and is being evaluated by pulmonary at the end of the month as well.  Patient previously has been on Coumadin from 2000 but was switched to Xarelto several years ago.  She notes no overt evidence of black stools or bleeding in the stools, no hematuria no nosebleeds no gum bleeds. Notes that she has been generally eating okay. Does endorse new night sweats over the last month or 2. No abdominal pain or significant change in bowel habits.  In the emergency room the patient had labs which showed hemoglobin of 6.6.5 with an MCV of 96.5.  Normal WBC count of 7.2k with elevated platelets of 685k which on repeat were down to 573k. Reticulocyte counts were relatively suppressed. LDH was elevated to 517 but haptoglobin was within normal limits at 45. Coombs negative Myeloma panel showed no M spike Ferritin level was elevated to 845 suggestive of acute phase  reaction/inflammation. B12 and folate within normal limits.  Patient received 2 units of PRBCs with an appropriate bump in her hemoglobin level and felt much better. CT chest abdomen pelvis was done which showed mild splenomegaly without a focal splenic lesion.  Multiple hepatic cysts.  Abnormal hypoenhancement in the right kidney lower pole of undetermined etiology -broad differential based on radiology input. right perirenal stranding is associated with a small amount of fluid in the adjacent right paracolic gutter, and a small fluid collection along the inferior margin of the liver which could be an exophytic cyst, small complex fluid collection, or conceivably a tumor nodule.  No other significant other lymphadenopathy noted.  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 The patient's last visit with [place] was on 05/30/18. The pt reports that she is doing well overall.   The pt reports that she has developed some redness on the skin underneath her eyes. She also feels that her lies have swollen periodically. She denies much concern for environmental or seasonal allergies. She does note some overlap with cosmetic use and will consider tracking this further.   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  Lab results (06/06/18) of CBC w/diff is as follows:  all values are WNL except for RBC at 3.22, HGB at 10.3, HCT at 29.6, RDW at 18.5, PLT at 522k, and Basophils abs at 200.  On review of systems, pt reports improved energy levels, stable  weight, and denies mouth sores, fevers, chills, night sweats, unexpected weight loss, changes in urination, pain over the flanks, abdominal pain, and any other symptoms.    MEDICAL HISTORY:  Past Medical History:  Diagnosis Date  . Chronic lower back pain   . Hypercholesteremia   . Hypertension    mild  . Obesity   . Paroxysmal atrial fibrillation (HCC)    managed with anticoagulation and rate control.   . Stroke [facility]) 2001 & 2015   "right side gets weak sometimes if I'm only tired" (03/16/2016)    SURGICAL HISTORY: Past Surgical History:  Procedure Laterality Date  . CARDIAC CATHETERIZATION  03/31/2000   EF 49%/LAD lg vessel which coursed to the apex & gave rise to 2 diagonal branches/ LAD noted to have 30% ostial lesion & tapers to a sm vessel toward the apex but no high grade lesion/1st diagonal med sized vessel & 2nd diagonal sm vessel with no significant disease/ lt ventriculogram reveals mild global hypokinesis w/ an estimated EF of 45-50%  . CARDIAC CATHETERIZATION N/A 03/17/2016   Procedure: Left Heart Cath and Coronary Angiography;  Surgeon: Wellington Hampshire, MD;  Location: [facility];  Service: Cardiovascular;  Laterality: N/A;  . LEFT HEART CATH AND CORONARY ANGIOGRAPHY N/A 03/31/2018   Procedure: LEFT HEART CATH AND CORONARY ANGIOGRAPHY;  Surgeon: Martinique, Peter M, MD;  Location: [facility];  Service: Cardiovascular;  Laterality: N/A;    SOCIAL HISTORY: Social History   Socioeconomic History  . Marital status: Married    Spouse name: Not on file  . Number of children: 2  . Years of education: Not on file  . Highest education level: Not on file  Occupational History  . Occupation: Herbalist  Social Needs  . Financial resource strain: Not on file  . Food insecurity:    Worry: Not on file    Inability: Not on file  . Transportation needs:    Medical: Not on file    Non-medical: Not on file  Tobacco Use  . Smoking status: Former Smoker     Packs/day: 0.12    Years: 15.00    Pack years: 1.80    Types: Cigarettes    Last attempt to quit: 10/18/1978    Years since quitting: 39.7  . Smokeless tobacco: Never Used  Substance and Sexual Activity  . Alcohol use: Yes    Alcohol/week: 14.0 standard drinks    Types: 14 Glasses of wine per week  . Drug use: No  . Sexual activity: Yes  Lifestyle  . Physical activity:    Days per week: Not on file    Minutes per session: Not on file  . Stress: Not on file  Relationships  . Social connections:    Talks on phone: Not on file    Gets together: Not on file    Attends religious service: Not on file    Active member of club or organization: Not on file    Attends meetings of clubs or organizations: Not on file    Relationship status: Not on file  . Intimate partner violence:    Fear of current or ex partner: Not on file    Emotionally abused: Not on file    Physically abused: Not on  file    Forced sexual activity: Not on file  Other Topics Concern  . Not on file  Social History Narrative  . Not on file    FAMILY HISTORY: Family History  Problem Relation Age of Onset  . Heart disease Father   . Asthma Father   . Diabetes Father   . Heart disease Mother   . Heart disease Brother   . Hyperlipidemia Sister   . Hyperlipidemia Brother   . Hyperlipidemia Brother   . Cancer Neg Hx     ALLERGIES:  is allergic to sulfonamide derivatives; lipitor [atorvastatin]; pravachol [pravastatin sodium]; and statins.  MEDICATIONS:  Current Outpatient Medications  Medication Sig Dispense Refill  . acetaminophen (TYLENOL) 500 MG tablet Take 1,000 mg by mouth as needed for mild pain or headache.     . cholecalciferol (VITAMIN D) 1000 UNITS tablet Take 1,000 Units by mouth daily.    . fenofibrate 160 MG tablet Take 1 tablet (160 mg total) by mouth daily. 90 tablet 3  . ipratropium (ATROVENT) 0.06 % nasal spray Place 2 sprays into both nostrils every 4 (four) hours as needed for rhinitis. 15  mL 5  . metoprolol tartrate (LOPRESSOR) 25 MG tablet Take 0.5 tablets (12.5 mg total) by mouth 2 (two) times daily. 180 tablet 3  . rivaroxaban (XARELTO) 20 MG TABS tablet Take 1 tablet (20 mg total) by mouth daily. 30 tablet 11   No current facility-administered medications for this visit.     REVIEW OF SYSTEMS:    A 10+ POINT REVIEW OF SYSTEMS WAS OBTAINED including neurology, dermatology, psychiatry, cardiac, respiratory, lymph, extremities, GI, GU, Musculoskeletal, constitutional, breasts, reproductive, HEENT.  All pertinent positives are noted in the HPI.  All others are negative.   PHYSICAL EXAMINATION: ECOG PERFORMANCE STATUS: 1 - Symptomatic but completely ambulatory  Vitals:   06/22/18 0839  BP: (!) 118/58  Pulse: 83  Resp: 18  Temp: 98.1 F (36.7 C)  SpO2: 98%   Filed Weights   06/22/18 0839  Weight: 178 lb 12.8 oz (81.1 kg)   .Body mass index is 29.26 kg/m.  GENERAL:alert, in no acute distress and comfortable SKIN: no acute rashes, no significant lesions EYES: conjunctiva are pink and non-injected, sclera anicteric OROPHARYNX: MMM, no exudates, no oropharyngeal erythema or ulceration NECK: supple, no JVD LYMPH:  no palpable lymphadenopathy in the cervical, axillary or inguinal regions LUNGS: clear to auscultation b/l with normal respiratory effort HEART: irregular S1S2 ABDOMEN:  normoactive bowel sounds , non tender, not distended. No palpable hepatosplenomegaly.  Extremity: trace pedal edema PSYCH: alert & oriented x 3 with fluent speech NEURO: no focal motor/sensory deficits    LABORATORY DATA:  I have reviewed the data as listed  . CBC Latest Ref Rng & Units 06/06/2018 05/31/2018 05/25/2018  WBC 4.0 - 10.5 K/uL 8.3 8.4 7.4  Hemoglobin 12.0 - 15.0 g/dL 10.3(L) 7.2(L) 9.9(L)  Hematocrit 36.0 - 46.0 % 29.6(L) 21.8(L) 30.0(L)  Platelets 150.0 - 400.0 K/uL 522.0(H) 492(H) 483(H)    . CMP Latest Ref Rng & Units 05/31/2018 05/25/2018 05/24/2018  Glucose 70 - 99  mg/dL 112(H) 122(H) 113(H)  BUN 8 - 23 mg/dL 30(H) 20 17  Creatinine 0.44 - 1.00 mg/dL 1.37(H) 1.07(H) 1.07(H)  Sodium 135 - 145 mmol/L 140 139 139  Potassium 3.5 - 5.1 mmol/L 4.0 4.8 4.9  Chloride 98 - 111 mmol/L 105 105 104  CO2 22 - 32 mmol/L _0 Calcium 8.9 - 10.3 mg/dL 9.0 9.1 8.7(L)  Total Protein 6.5 - 8.1 g/dL 7.3 - -  Total Bilirubin 0.3 - 1.2 mg/dL 2.1(H) - -  Alkaline Phos 38 - 126 U/L 51 - -  AST 15 - 41 U/L 22 - -  ALT 0 - 44 U/L 13 - -   05/31/18 Molecular Pathology:   05/24/18 Cytogenetics:     05/24/18 BM Bx:   05/24/18 BM Flow cytometry:     RADIOGRAPHIC STUDIES: I have personally reviewed the radiological images as listed and agreed with the findings in the report. Ct Chest W Contrast  Addendum Date: 05/23/2018   ADDENDUM REPORT: 05/23/2018 15:21 ADDENDUM: These results were called by telephone at the time of interpretation on 05/23/2018 at 3:21 pm to Dr. Eulogio Bear , who verbally acknowledged these results. Electronically Signed   By: Van Clines M.D.   On: 05/23/2018 15:21   Result Date: 05/23/2018 CLINICAL DATA:  Anemia, weakness EXAM: CT CHEST, ABDOMEN, AND PELVIS WITH CONTRAST TECHNIQUE: Multidetector CT imaging of the chest, abdomen and pelvis was performed following the standard protocol during bolus administration of intravenous contrast. CONTRAST:  41m OMNIPAQUE IOHEXOL 300 MG/ML  SOLN COMPARISON:  Chest radiograph 04/11/2018 FINDINGS: CT CHEST FINDINGS Cardiovascular: Coronary, aortic arch, and branch vessel atherosclerotic vascular disease. Mediastinum/Nodes: Unremarkable Lungs/Pleura: Biapical pleuroparenchymal scarring. Old granulomatous disease with calcified granulomas most notably in a right peribronchovascular distribution. Paraseptal emphysema. Musculoskeletal: Mild thoracic spondylosis. Small sebaceous cyst or similar likely benign lesion along the right paracentral back at the T12 vertebral level. CT ABDOMEN PELVIS FINDINGS Hepatobiliary:  Various hypodense hepatic lesions are present, the smaller lesions are technically too small to characterize but the larger lesions are fluid density and compatible with cysts. The largest is in the lateral segment left hepatic lobe measuring 6.1 by 5.1 cm with internal density of 6 Hounsfield units. The gallbladder is contracted. Cholelithiasis is present with nitrogen gas phenomenon within at least 1 gallstone. The common hepatic duct measures up to 1.2 cm in diameter, with the common bile duct at 0.9 cm. Pancreas: Unremarkable Spleen: The spleen measures 14.3 by 5.6 by 14.2 cm (volume = 600 cm^3) and is slightly heterogeneous on the late arterial phase images but homogeneous on the delayed images. Adrenals/Urinary Tract: Both adrenal glands appear normal. There is an infiltrative process in the lower half of the right kidney with abnormal hypoenhancement and heterogeneity of enhancement of the involved renal parenchyma, and indistinctness of the cortical margin with surrounding stranding. Infiltrative process or perirenal fluid tracks along the capsular margin of the kidney on image 23/8. Possibilities include pyelonephritis (and early renal abscess is difficult to exclude), renal infarct, and infiltrative malignancy such as renal involvement by lymphoma. No hydronephrosis or hydroureter. No appreciable urinary tract calculi. Stomach/Bowel: Unremarkable Vascular/Lymphatic: Aortoiliac atherosclerotic vascular disease. No pathologic retroperitoneal adenopathy is identified. Reproductive: Retroverted uterus, with right fundal calcification likely representing a calcified fibroid. Adnexa unremarkable. Other: Perirenal stranding on the right with some adjacent fluid in the right paracolic gutter. There is a 1.1 by 1.1 cm low-density nodule along the inferior margin of the right hepatic lobe which could represent complex fluid, tumor nodule, exophytic hepatic cyst, or a small abscess. Small amount of free pelvic fluid  eccentric to the right. Musculoskeletal: Dextroconvex lumbar scoliosis with rotary component. Lumbar spondylosis and degenerative disc disease causing mild right foraminal impingement at L4-5. IMPRESSION: 1. Abnormal hypoenhancement in the parenchyma of the right kidney lower pole compatible with infiltrative process, possibilities include pyelonephritis (early renal abscess difficult to exclude), renal infarct,  and infiltrative malignancy such as renal lymphoma. Correlate with urine analysis. There is potentially some fluid tracking along the right renal capsular margin. 2. Mild splenomegaly without definite splenic focal lesion. Splenomegaly is nonspecific but could be a corroborative finding of lymphoma. 3. Scattered hypodense hepatic lesions are likely cysts, although the smaller lesions are technically nonspecific. 4. The right perirenal stranding is associated with a small amount of fluid in the adjacent right paracolic gutter, and a small fluid collection along the inferior margin of the liver which could be an exophytic cyst, small complex fluid collection, or conceivably a tumor nodule. 5. Other imaging findings of potential clinical significance: Aortic Atherosclerosis (ICD10-I70.0). Coronary atherosclerosis. Emphysema (ICD10-J43.9). Old granulomatous disease. Calcified uterine fibroid. Right foraminal impingement at L4-5. Cholelithiasis. Mild extrahepatic biliary dilatation. Radiology assistant personnel have been notified to put me in telephone contact with the referring physician or the referring physician's clinical representative in order to discuss these findings. Once this communication is established I will issue an addendum to this report for documentation purposes. Electronically Signed: By: Van Clines M.D. On: 05/23/2018 15:17   Mr Abdomen W Wo Contrast  Result Date: 06/10/2018 CLINICAL DATA:  Right renal mass on CT EXAM: MRI ABDOMEN WITHOUT AND WITH CONTRAST TECHNIQUE: Multiplanar  multisequence MR imaging of the abdomen was performed both before and after the administration of intravenous contrast. CONTRAST:  60m MULTIHANCE GADOBENATE DIMEGLUMINE 529 MG/ML IV SOLN COMPARISON:  CT abdomen/pelvis dated 05/23/2018 FINDINGS: Lower chest: Lung bases are clear. Hepatobiliary: Multiple hepatic cysts, measuring up to 6.0 cm in the left hepatic lobe (series 3/image 16). Layering gallstones, measuring up to 1.5 cm (series 3/image 33). No associated inflammatory changes. No intrahepatic or extrahepatic ductal dilatation. Pancreas:  Within normal limits. Spleen:  Within normal limits. Adrenals/Urinary Tract:  Adrenal glands are within normal limits. Cortical scarring along the posterior left upper kidney (series 903/image 56). Left kidney is otherwise within normal limits. No hydronephrosis. Large area of renal hypoperfusion predominantly involving the posterior interpolar right kidney (series 903/image 91), as well as a small portion of the anterior right lower pole (series 903/image 98). Notably, there is a preserved rim of capsular enhancement, due to collateral perfusion, confirming the diagnosis. Given the multifocal appearance, thromboembolism is suspected, although additional potential underlying etiologies include trauma or vasculitis. Stomach/Bowel: Stomach is within normal limits. Visualized bowel is unremarkable. Vascular/Lymphatic:  No evidence of abdominal aortic aneurysm. No suspicious abdominal lymphadenopathy. Other:  No abdominal ascites. Musculoskeletal: Degenerative changes of the lumbar spine with heterogeneous marrow signal. 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 Cholelithiasis, without associated inflammatory changes. Additional ancillary findings as above. Electronically Signed   By: SJulian HyM.D.   On: 06/10/2018 14:04   Ct Abdomen Pelvis W  Contrast  Addendum Date: 05/23/2018   ADDENDUM REPORT: 05/23/2018 15:21 ADDENDUM: These results were called by telephone at the time of interpretation on 05/23/2018 at 3:21 pm to Dr. JEulogio Bear, who verbally acknowledged these results. Electronically Signed   By: WVan ClinesM.D.   On: 05/23/2018 15:21   Result Date: 05/23/2018 CLINICAL DATA:  Anemia, weakness EXAM: CT CHEST, ABDOMEN, AND PELVIS WITH CONTRAST TECHNIQUE: Multidetector CT imaging of the chest, abdomen and pelvis was performed following the standard protocol during bolus administration of intravenous contrast. CONTRAST:  746mOMNIPAQUE IOHEXOL 300 MG/ML  SOLN COMPARISON:  Chest radiograph 04/11/2018 FINDINGS: CT CHEST FINDINGS Cardiovascular: Coronary, aortic arch, and branch vessel atherosclerotic vascular  disease. Mediastinum/Nodes: Unremarkable Lungs/Pleura: Biapical pleuroparenchymal scarring. Old granulomatous disease with calcified granulomas most notably in a right peribronchovascular distribution. Paraseptal emphysema. Musculoskeletal: Mild thoracic spondylosis. Small sebaceous cyst or similar likely benign lesion along the right paracentral back at the T12 vertebral level. CT ABDOMEN PELVIS FINDINGS Hepatobiliary: Various hypodense hepatic lesions are present, the smaller lesions are technically too small to characterize but the larger lesions are fluid density and compatible with cysts. The largest is in the lateral segment left hepatic lobe measuring 6.1 by 5.1 cm with internal density of 6 Hounsfield units. The gallbladder is contracted. Cholelithiasis is present with nitrogen gas phenomenon within at least 1 gallstone. The common hepatic duct measures up to 1.2 cm in diameter, with the common bile duct at 0.9 cm. Pancreas: Unremarkable Spleen: The spleen measures 14.3 by 5.6 by 14.2 cm (volume = 600 cm^3) and is slightly heterogeneous on the late arterial phase images but homogeneous on the delayed images. Adrenals/Urinary Tract:  Both adrenal glands appear normal. There is an infiltrative process in the lower half of the right kidney with abnormal hypoenhancement and heterogeneity of enhancement of the involved renal parenchyma, and indistinctness of the cortical margin with surrounding stranding. Infiltrative process or perirenal fluid tracks along the capsular margin of the kidney on image 23/8. Possibilities include pyelonephritis (and early renal abscess is difficult to exclude), renal infarct, and infiltrative malignancy such as renal involvement by lymphoma. No hydronephrosis or hydroureter. No appreciable urinary tract calculi. Stomach/Bowel: Unremarkable Vascular/Lymphatic: Aortoiliac atherosclerotic vascular disease. No pathologic retroperitoneal adenopathy is identified. Reproductive: Retroverted uterus, with right fundal calcification likely representing a calcified fibroid. Adnexa unremarkable. Other: Perirenal stranding on the right with some adjacent fluid in the right paracolic gutter. There is a 1.1 by 1.1 cm low-density nodule along the inferior margin of the right hepatic lobe which could represent complex fluid, tumor nodule, exophytic hepatic cyst, or a small abscess. Small amount of free pelvic fluid eccentric to the right. Musculoskeletal: Dextroconvex lumbar scoliosis with rotary component. Lumbar spondylosis and degenerative disc disease causing mild right foraminal impingement at L4-5. IMPRESSION: 1. Abnormal hypoenhancement in the parenchyma of the right kidney lower pole compatible with infiltrative process, possibilities include pyelonephritis (early renal abscess difficult to exclude), renal infarct, and infiltrative malignancy such as renal lymphoma. Correlate with urine analysis. There is potentially some fluid tracking along the right renal capsular margin. 2. Mild splenomegaly without definite splenic focal lesion. Splenomegaly is nonspecific but could be a corroborative finding of lymphoma. 3. Scattered  hypodense hepatic lesions are likely cysts, although the smaller lesions are technically nonspecific. 4. The right perirenal stranding is associated with a small amount of fluid in the adjacent right paracolic gutter, and a small fluid collection along the inferior margin of the liver which could be an exophytic cyst, small complex fluid collection, or conceivably a tumor nodule. 5. Other imaging findings of potential clinical significance: Aortic Atherosclerosis (ICD10-I70.0). Coronary atherosclerosis. Emphysema (ICD10-J43.9). Old granulomatous disease. Calcified uterine fibroid. Right foraminal impingement at L4-5. Cholelithiasis. Mild extrahepatic biliary dilatation. Radiology assistant personnel have been notified to put me in telephone contact with the referring physician or the referring physician's clinical representative in order to discuss these findings. Once this communication is established I will issue an addendum to this report for documentation purposes. Electronically Signed: By: Van Clines M.D. On: 05/23/2018 15:17   Ct Bone Marrow Biopsy & Aspiration  Result Date: 05/24/2018 INDICATION: 72 year old with anemia workup. EXAM: CT GUIDED BONE MARROW ASPIRATES AND BIOPSY Physician:  Adam R. Anselm Pancoast, MD MEDICATIONS: None. ANESTHESIA/SEDATION: Fentanyl 50 mcg IV; Versed 1.5 mg IV Moderate Sedation Time:  21 minutes The patient was continuously monitored during the procedure by the interventional radiology nurse under my direct supervision. COMPLICATIONS: None immediate. PROCEDURE: The procedure was explained to the patient. The risks and benefits of the procedure were discussed and the patient's questions were addressed. Informed consent was obtained from the patient. The patient was placed prone on CT table. Images of the pelvis were obtained. The left side of back was prepped and draped in sterile fashion. The skin and left posterior ilium were anesthetized with 1% lidocaine. 11 gauge bone needle  was directed into the left ilium with CT guidance. Two aspirates and two core biopsies were obtained. Bandage placed over the puncture site. FINDINGS: Bone needle directed into the posterior left ilium. IMPRESSION: CT guided bone marrow aspiration and core biopsy. Electronically Signed   By: Markus Daft M.D.   On: 05/24/2018 10:09    ASSESSMENT & PLAN:   72 y.o. female with  1. 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 No overt evidence of bleeding.  In the emergency room the patient had labs which showed hemoglobin of 6.6.5 with an MCV of 96.5.  Normal WBC count of 7.2k with elevated platelets of 685k which on repeat were down to 573k. Reticulocyte counts were relatively suppressed. LDH was elevated to 517 but haptoglobin was within normal limits at 45. Coombs negative Myeloma panel showed no M spike Ferritin level was elevated to 845 suggestive of acute phase reaction/inflammation. B12 and folate within normal limits.  Patient received 2 units of PRBCs with an appropriate bump in her hemoglobin level and felt much better.  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 Rt renal radiographic abnormality as noted in CT with some fluid collection. Unclear etiology Abdominal examination was completely benign. No fevers chills to suggest acute pyelonephritis or renal abscess. Urine analysis showed trace leuk esterase and 6-10 WBCs no significant hematuria. Broad differential diagnosis based on CT findings as per radiologist. Procalcitonin levels unimpressive  05/23/18 CT Chest Abdomen Pelvis was done which showed mild splenomegaly without a focal splenic lesion. Multiple hepatic cysts.  Abnormal hypoenhancement in the right kidney lower pole of undetermined etiology -broad differential based on radiology input. right  perirenal stranding is associated with a small amount of fluid in the adjacent right paracolic gutter, and a small fluid collection along the inferior margin of the liver which could be an exophytic cyst, small complex fluid collection, or conceivably a tumor nodule. No other significant other lymphadenopathy noted.   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 -Discussed that I would like to trend out her blood counts and monitor her labs for a little while before considering initiating treatment with hydroxyurea, and will support her anemia as necessary with transfusions -Will see the pt back in one month, sooner if any new concerns    Referral to Urology in 1-2 weeks to evaluate renal lesion RTC with Dr Irene Limbo in 4 weeks with labs   All of the patients questions were answered with apparent satisfaction. The patient knows to call the clinic with any problems, questions  or concerns.  The total time spent in the appt was 25 minutes and more than 50% was on counseling and direct patient cares.     Sullivan Lone MD MS AAHIVMS [facility] Hematology/Oncology Physician [facility]  (Office):       [phone] (Work cell):  [phone] (Fax):           [phone]  06/22/2018 9:29 AM  I, Baldwin Jamaica, am acting as a scribe for Dr. Irene Limbo  .I have reviewed the above documentation for accuracy and completeness, and I agree with the above. Brunetta Genera MD

## 2018-06-22 ENCOUNTER — Encounter: Payer: Self-pay | Admitting: Hematology

## 2018-06-22 ENCOUNTER — Telehealth: Payer: Self-pay | Admitting: Hematology

## 2018-06-22 ENCOUNTER — Inpatient Hospital Stay: Payer: Medicare Other | Attending: Hematology | Admitting: Hematology

## 2018-06-22 VITALS — BP 118/58 | HR 83 | Temp 98.1°F | Resp 18 | Ht 65.55 in | Wt 178.8 lb

## 2018-06-22 DIAGNOSIS — Z1589 Genetic susceptibility to other disease: Secondary | ICD-10-CM | POA: Diagnosis not present

## 2018-06-22 DIAGNOSIS — D469 Myelodysplastic syndrome, unspecified: Secondary | ICD-10-CM | POA: Diagnosis not present

## 2018-06-22 DIAGNOSIS — N2889 Other specified disorders of kidney and ureter: Secondary | ICD-10-CM | POA: Diagnosis not present

## 2018-06-22 DIAGNOSIS — D75839 Thrombocytosis, unspecified: Secondary | ICD-10-CM

## 2018-06-22 DIAGNOSIS — D461 Refractory anemia with ring sideroblasts: Secondary | ICD-10-CM | POA: Diagnosis not present

## 2018-06-22 DIAGNOSIS — D473 Essential (hemorrhagic) thrombocythemia: Secondary | ICD-10-CM

## 2018-06-22 DIAGNOSIS — R161 Splenomegaly, not elsewhere classified: Secondary | ICD-10-CM

## 2018-06-22 NOTE — Telephone Encounter (Signed)
Appts scheduled AVS/Calendar printed/ referral added to proficient for [facility] per 9/5 los

## 2018-07-07 ENCOUNTER — Ambulatory Visit: Payer: Medicare Other | Admitting: Pulmonary Disease

## 2018-07-07 ENCOUNTER — Encounter: Payer: Self-pay | Admitting: Pulmonary Disease

## 2018-07-07 VITALS — BP 118/64 | HR 91 | Ht 65.75 in | Wt 176.4 lb

## 2018-07-07 DIAGNOSIS — D649 Anemia, unspecified: Secondary | ICD-10-CM | POA: Diagnosis not present

## 2018-07-07 DIAGNOSIS — J300 Vasomotor rhinitis: Secondary | ICD-10-CM

## 2018-07-07 DIAGNOSIS — R0600 Dyspnea, unspecified: Secondary | ICD-10-CM | POA: Diagnosis not present

## 2018-07-07 DIAGNOSIS — K219 Gastro-esophageal reflux disease without esophagitis: Secondary | ICD-10-CM

## 2018-07-07 DIAGNOSIS — J849 Interstitial pulmonary disease, unspecified: Secondary | ICD-10-CM

## 2018-07-07 NOTE — Addendum Note (Signed)
Addended by: Lorretta Harp on: 07/07/2018 11:57 AM   Modules accepted: Orders

## 2018-07-07 NOTE — Patient Instructions (Signed)
Gastroesophageal reflux disease: I believe this was the cause for your cough You can try stopping Prilosec If the cough recurs you should resume Prilosec  Mild interstitial lung disease: We will repeat a lung function test in January to make sure that there is no evidence of this worsening  Anemia: Continue follow-up with the hematology clinic  Shortness of breath: I believe this is due to anemia, mild interstitial lung disease, and deconditioning I recommend that you start exercising regularly  We will see you back in January or sooner if needed

## 2018-07-07 NOTE — Progress Notes (Signed)
Synopsis: Referred in Aug 2019 for dyspnea, abnormal PFT showing a low diffusion capacity.  She had some non-specific interstitial changes on her CT chest.  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   Subjective:   PATIENT ID: Alexandra Pierce GENDER: female DOB: 06-04-1946, MRN: [id number]   HPI  Chief Complaint  Patient presents with  . Follow-up    shortness of breath    Alexandra Pierce says that she is not bothered by cough.  She is not sure if she still coughing or not.  She has been taking Prilosec every day for the last month.  She says that the ipratropium nose spray did not help her postnasal drip.  She says that her shortness of breath is the same as it was last time.  She has not started exercising because of shortness of breath.  Past Medical History:  Diagnosis Date  . Chronic lower back pain   . Hypercholesteremia   . Hypertension    mild  . Obesity   . Paroxysmal atrial fibrillation (HCC)    managed with anticoagulation and rate control.   . Stroke [facility]) 2001 & 2015   "right side gets weak sometimes if I'm only tired" (03/16/2016)      Review of Systems  Constitutional: Negative for fever and weight loss.  HENT: Positive for congestion. Negative for ear pain, nosebleeds and sore throat.   Eyes: Negative for redness.  Respiratory: Positive for cough and shortness of breath. Negative for wheezing.   Cardiovascular: Positive for palpitations. Negative for leg swelling and PND.  Gastrointestinal: Negative for nausea and vomiting.  Genitourinary: Negative for dysuria.  Skin: Negative for rash.  Neurological: Negative for headaches.  Endo/Heme/Allergies: Bruises/bleeds easily.  Psychiatric/Behavioral: Negative for depression. The patient is not nervous/anxious.       Objective:  Physical Exam   Vitals:   07/07/18 1124  BP: 118/64  Pulse: 91  SpO2: 93%  Weight: 176 lb 6.4 oz (80 kg)  Height: 5' 5.75" (1.67 m)    Gen: well appearing HENT: OP  clear, TM's clear, neck supple PULM: CTA B, normal percussion CV: RRR, no mgr, trace edema GI: BS+, soft, nontender Derm: no cyanosis or rash Psyche: normal mood and affect    CBC    Component Value Date/Time   WBC 8.3 06/06/2018 1613   RBC 3.22 (L) 06/06/2018 1613   HGB 10.3 (L) 06/06/2018 1613   HGB 11.4 03/27/2018 1012   HCT 29.6 (L) 06/06/2018 1613   HCT 33.2 (L) 03/27/2018 1012   PLT 522.0 (H) 06/06/2018 1613   PLT 417 03/27/2018 1012   MCV 91.9 06/06/2018 1613   MCV 96 03/27/2018 1012   MCH 30.6 05/31/2018 1432   MCHC 34.6 06/06/2018 1613   RDW 18.5 (H) 06/06/2018 1613   RDW 14.5 03/27/2018 1012   LYMPHSABS 2.4 06/06/2018 1613   MONOABS 0.3 06/06/2018 1613   EOSABS 0.2 06/06/2018 1613   BASOSABS 0.2 (H) 06/06/2018 1613     Chest imaging: August 2019 CT chest images independently reviewed showing some mosaicism, nonspecific reticulation along the periphery of the lungs in a dependent fashion, some cysts, question emphysema, one focal area of bronchiolectasis in the right middle lobe  PFT: July 2019 pulmonary function testing: Ratio normal, FEV1 2.44 L 103% predicted, total lung capacity 6.1 mL 113% predicted, DLCO 14.26 52% predicted  Labs: 2019 hemoglobin values have ranged from 6.5-11.4  Path:  Echo: 2017 echocardiogram: No wall motion abnormalities, mild mitral regurgitation, left atrium  moderately dilated, PA systolic pressure 32 mmHg  Heart Catheterization:       Assessment & Plan:   Dyspnea, unspecified type  Anemia, unspecified type  Vasomotor rhinitis  Gastroesophageal reflux disease without esophagitis  ILD (interstitial lung disease) ([place])  Discussion: Alexandra Pierce has dyspnea due to anemia, perhaps some very mild interstitial lung disease, and physical deconditioning.  I think the deconditioning is the predominant cause of her shortness of breath.  The interstitial lung disease is very mild and unlikely to be the primary cause of the  cough or shortness of breath.  I think her cough is due predominantly from postnasal drip and gastroesophageal reflux disease.  At this point she says she is not bothered by the cough so I think the antacid help.  Plan: Gastroesophageal reflux disease: I believe this was the cause for your cough You can try stopping Prilosec If the cough recurs you should resume Prilosec  Mild interstitial lung disease: We will repeat a lung function test in January to make sure that there is no evidence of this worsening  Anemia: Continue follow-up with the hematology clinic  Shortness of breath: I believe this is due to anemia, mild interstitial lung disease, and deconditioning I recommend that you start exercising regularly  We will see you back in January or sooner if needed    Current Outpatient Medications:  .  acetaminophen (TYLENOL) 500 MG tablet, Take 1,000 mg by mouth as needed for mild pain or headache. , Disp: , Rfl:  .  cholecalciferol (VITAMIN D) 1000 UNITS tablet, Take 1,000 Units by mouth daily., Disp: , Rfl:  .  fenofibrate 160 MG tablet, Take 1 tablet (160 mg total) by mouth daily., Disp: 90 tablet, Rfl: 3 .  ipratropium (ATROVENT) 0.06 % nasal spray, Place 2 sprays into both nostrils every 4 (four) hours as needed for rhinitis., Disp: 15 mL, Rfl: 5 .  metoprolol tartrate (LOPRESSOR) 25 MG tablet, Take 0.5 tablets (12.5 mg total) by mouth 2 (two) times daily., Disp: 180 tablet, Rfl: 3 .  rivaroxaban (XARELTO) 20 MG TABS tablet, Take 1 tablet (20 mg total) by mouth daily., Disp: 30 tablet, Rfl: 11

## 2018-07-18 ENCOUNTER — Inpatient Hospital Stay: Payer: Medicare Other | Attending: Hematology

## 2018-07-18 DIAGNOSIS — Z8673 Personal history of transient ischemic attack (TIA), and cerebral infarction without residual deficits: Secondary | ICD-10-CM | POA: Insufficient documentation

## 2018-07-18 DIAGNOSIS — I480 Paroxysmal atrial fibrillation: Secondary | ICD-10-CM | POA: Diagnosis not present

## 2018-07-18 DIAGNOSIS — Z7901 Long term (current) use of anticoagulants: Secondary | ICD-10-CM | POA: Diagnosis not present

## 2018-07-18 DIAGNOSIS — I10 Essential (primary) hypertension: Secondary | ICD-10-CM | POA: Diagnosis not present

## 2018-07-18 DIAGNOSIS — D461 Refractory anemia with ring sideroblasts: Secondary | ICD-10-CM | POA: Diagnosis present

## 2018-07-18 DIAGNOSIS — Z1589 Genetic susceptibility to other disease: Secondary | ICD-10-CM

## 2018-07-18 DIAGNOSIS — D75839 Thrombocytosis, unspecified: Secondary | ICD-10-CM

## 2018-07-18 DIAGNOSIS — D473 Essential (hemorrhagic) thrombocythemia: Secondary | ICD-10-CM | POA: Insufficient documentation

## 2018-07-18 LAB — CMP (CANCER CENTER ONLY)
ALBUMIN: 4.3 g/dL (ref 3.5–5.0)
ALT: 19 U/L (ref 0–44)
AST: 25 U/L (ref 15–41)
Alkaline Phosphatase: 35 U/L — ABNORMAL LOW (ref 38–126)
Anion gap: 7 (ref 5–15)
BUN: 19 mg/dL (ref 8–23)
CHLORIDE: 107 mmol/L (ref 98–111)
CO2: 28 mmol/L (ref 22–32)
CREATININE: 1.12 mg/dL — AB (ref 0.44–1.00)
Calcium: 9.1 mg/dL (ref 8.9–10.3)
GFR, Est AFR Am: 55 mL/min — ABNORMAL LOW (ref >60)
GFR, Estimated: 48 mL/min — ABNORMAL LOW (ref >60)
GLUCOSE: 102 mg/dL — AB (ref 70–99)
Potassium: 4.1 mmol/L (ref 3.5–5.1)
SODIUM: 142 mmol/L (ref 135–145)
Total Bilirubin: 2.3 mg/dL — ABNORMAL HIGH (ref 0.3–1.2)
Total Protein: 7.4 g/dL (ref 6.5–8.1)

## 2018-07-18 LAB — CBC WITH DIFFERENTIAL/PLATELET
Basophils Absolute: 0.1 10*3/uL (ref 0.0–0.1)
Basophils Relative: 3 %
EOS ABS: 0.1 10*3/uL (ref 0.0–0.5)
Eosinophils Relative: 3 %
HCT: 27.7 % — ABNORMAL LOW (ref 34.8–46.6)
HEMOGLOBIN: 9.5 g/dL — AB (ref 11.6–15.9)
LYMPHS ABS: 1.5 10*3/uL (ref 0.9–3.3)
Lymphocytes Relative: 31 %
MCH: 34 pg (ref 25.1–34.0)
MCHC: 34.4 g/dL (ref 31.5–36.0)
MCV: 99 fL (ref 79.5–101.0)
Monocytes Absolute: 0.2 10*3/uL (ref 0.1–0.9)
Monocytes Relative: 5 %
Neutro Abs: 2.7 10*3/uL (ref 1.5–6.5)
Neutrophils Relative %: 58 %
Platelets: 480 10*3/uL — ABNORMAL HIGH (ref 145–400)
RBC: 2.8 MIL/uL — ABNORMAL LOW (ref 3.70–5.45)
RDW: 18.4 % — ABNORMAL HIGH (ref 11.2–14.5)
WBC: 4.7 10*3/uL (ref 3.9–10.3)

## 2018-07-18 LAB — FERRITIN: Ferritin: 445 ng/mL — ABNORMAL HIGH (ref 11–307)

## 2018-07-18 LAB — IRON AND TIBC
Iron: 236 ug/dL — ABNORMAL HIGH (ref 41–142)
SATURATION RATIOS: 57 % (ref 21–57)
TIBC: 415 ug/dL (ref 236–444)
UIBC: 179 ug/dL

## 2018-07-18 LAB — SAMPLE TO BLOOD BANK

## 2018-07-25 ENCOUNTER — Telehealth: Payer: Self-pay

## 2018-07-25 ENCOUNTER — Inpatient Hospital Stay (HOSPITAL_BASED_OUTPATIENT_CLINIC_OR_DEPARTMENT_OTHER): Payer: Medicare Other | Admitting: Hematology

## 2018-07-25 ENCOUNTER — Encounter: Payer: Self-pay | Admitting: Hematology

## 2018-07-25 ENCOUNTER — Other Ambulatory Visit: Payer: Medicare Other

## 2018-07-25 VITALS — BP 110/70 | HR 94 | Temp 98.0°F | Resp 17 | Ht 65.75 in | Wt 178.3 lb

## 2018-07-25 DIAGNOSIS — Z7901 Long term (current) use of anticoagulants: Secondary | ICD-10-CM

## 2018-07-25 DIAGNOSIS — I480 Paroxysmal atrial fibrillation: Secondary | ICD-10-CM | POA: Diagnosis not present

## 2018-07-25 DIAGNOSIS — Z8673 Personal history of transient ischemic attack (TIA), and cerebral infarction without residual deficits: Secondary | ICD-10-CM

## 2018-07-25 DIAGNOSIS — I10 Essential (primary) hypertension: Secondary | ICD-10-CM | POA: Diagnosis not present

## 2018-07-25 DIAGNOSIS — D461 Refractory anemia with ring sideroblasts: Secondary | ICD-10-CM

## 2018-07-25 DIAGNOSIS — D75839 Thrombocytosis, unspecified: Secondary | ICD-10-CM

## 2018-07-25 DIAGNOSIS — D473 Essential (hemorrhagic) thrombocythemia: Secondary | ICD-10-CM

## 2018-07-25 DIAGNOSIS — Z1589 Genetic susceptibility to other disease: Secondary | ICD-10-CM

## 2018-07-25 NOTE — Telephone Encounter (Signed)
Resubmitted referral to [facility]. Printed avs and calender of upcoming appointment. Per 10/8 los

## 2018-07-25 NOTE — Progress Notes (Signed)
HEMATOLOGY/ONCOLOGY CONSULTATION NOTE  Date of Service: 07/25/2018  Patient Care Team: Chesley Noon, MD as PCP - General (Family Medicine)  CHIEF COMPLAINTS/PURPOSE OF CONSULTATION:  mx of MDS/MPN  HISTORY OF PRESENTING ILLNESS:   Alexandra Pierce is a wonderful 72 y.o. female who has been referred to [place] by Dr. Eliseo Squires for evaluation and management of symptomatic anemia.   Patient has history of hypertension, dyslipidemia, atrial fibrillation on anticoagulation with previous history of CVA x2, obesity who presented with generalized weakness.  Prior to admission she had an episode of blurring of vision especially in the right eye associated with a spell of confusion and possible loss of consciousness for a few seconds associated with disorientation.  She notes that she has also been feeling progressively fatigued for a few weeks and has had difficulty with dyspnea on exertion with short distances. Her daughters insisted she get evaluated and so she came to the emergency room for further evaluation. She does follow-up with cardiology and is being evaluated by pulmonary at the end of the month as well.  Patient previously has been on Coumadin from 2000 but was switched to Xarelto several years ago.  She notes no overt evidence of black stools or bleeding in the stools, no hematuria no nosebleeds no gum bleeds. Notes that she has been generally eating okay. Does endorse new night sweats over the last month or 2. No abdominal pain or significant change in bowel habits.  In the emergency room the patient had labs which showed hemoglobin of 6.6.5 with an MCV of 96.5.  Normal WBC count of 7.2k with elevated platelets of 685k which on repeat were down to 573k. Reticulocyte counts were relatively suppressed. LDH was elevated to 517 but haptoglobin was within normal limits at 45. Coombs negative Myeloma panel showed no M spike Ferritin level was elevated to 845 suggestive of acute phase  reaction/inflammation. B12 and folate within normal limits.  Patient received 2 units of PRBCs with an appropriate bump in her hemoglobin level and felt much better. CT chest abdomen pelvis was done which showed mild splenomegaly without a focal splenic lesion.  Multiple hepatic cysts.  Abnormal hypoenhancement in the right kidney lower pole of undetermined etiology -broad differential based on radiology input. right perirenal stranding is associated with a small amount of fluid in the adjacent right paracolic gutter, and a small fluid collection along the inferior margin of the liver which could be an exophytic cyst, small complex fluid collection, or conceivably a tumor nodule.  No other significant other lymphadenopathy noted.  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 The patient's last visit with [place] was on 06/22/18. The pt reports that she is doing well overall.   The pt reports that she feels her energy is improved since her last visit. She denies having any back pains or abdominal pains. The pt notes that she has continued staying active in the interim. She has not developed any constitutional symptoms nor new bone pains.   The pt denies any concerns with bleeding and has no problems taking Xarelto.   The pt notes that she has not met with urology in the interim as I previously referred her to.    Lab results (07/18/18) of CBC w/diff, CMP, and  Reticulocytes is as follows: all values are WNL except for RBC at 2.80, HGB at 9.5, HCT at 27.7, RDW at 18.4, PLT at 480k, Glucose at 102, Creatinine at 1.12, Alk Phos at 35, Total Bilirubin at 2.3, GFR at 48.  On review of systems, pt reports improved energy levels, staying active, stable weight, and  denies abdominal pains, back pains, problems bleeding, concerns for infections, fevers, chills, night sweats, bone pains, pain along the spine, flank pain, leg swelling, and any other symptoms.    MEDICAL HISTORY:  Past Medical History:  Diagnosis Date  . Chronic lower back pain   . Hypercholesteremia   . Hypertension    mild  . Obesity   . Paroxysmal atrial fibrillation (HCC)    managed with anticoagulation and rate control.   . Stroke [facility]) 2001 & 2015   "right side gets weak sometimes if I'm only tired" (03/16/2016)    SURGICAL HISTORY: Past Surgical History:  Procedure Laterality Date  . CARDIAC CATHETERIZATION  03/31/2000   EF 49%/LAD lg vessel which coursed to the apex & gave rise to 2 diagonal branches/ LAD noted to have 30% ostial lesion & tapers to a sm vessel toward the apex but no high grade lesion/1st diagonal med sized vessel & 2nd diagonal sm vessel with no significant disease/ lt ventriculogram reveals mild global hypokinesis w/ an estimated EF of 45-50%  . CARDIAC CATHETERIZATION N/A 03/17/2016   Procedure: Left Heart Cath and Coronary Angiography;  Surgeon: Wellington Hampshire, MD;  Location: [place] [facility];  Service: Cardiovascular;  Laterality: N/A;  . LEFT HEART CATH AND CORONARY ANGIOGRAPHY N/A 03/31/2018   Procedure: LEFT HEART CATH AND CORONARY ANGIOGRAPHY;  Surgeon: Martinique, Peter M, MD;  Location: [place] [facility];  Service: Cardiovascular;  Laterality: N/A;    SOCIAL HISTORY: Social History   Socioeconomic History  . Marital status: Married    Spouse name: Not on file  . Number of children: 2  . Years of education: Not on file  . Highest education level: Not on file  Occupational History  . Occupation: Herbalist  Social Needs  . Financial resource strain: Not on file  . Food insecurity:    Worry: Not on file    Inability: Not on file  . Transportation needs:    Medical: Not on file    Non-medical: Not on file  Tobacco Use  . Smoking  status: Former Smoker    Packs/day: 0.12    Years: 15.00    Pack years: 1.80    Types: Cigarettes    Last attempt to quit: 10/18/1978    Years since quitting: 39.7  . Smokeless tobacco: Never Used  Substance and Sexual Activity  . Alcohol use: Yes    Alcohol/week: 14.0 standard drinks    Types: 14 Glasses of wine per week  . Drug use: No  . Sexual activity: Yes  Lifestyle  . Physical activity:    Days per week: Not on file    Minutes per session: Not on file  . Stress: Not on file  Relationships  . Social connections:    Talks on phone: Not on file    Gets together: Not on file    Attends religious service: Not on file    Active member of club or organization: Not on file    Attends meetings of clubs or organizations: Not on file    Relationship status: Not on file  . Intimate partner violence:    Fear  of current or ex partner: Not on file    Emotionally abused: Not on file    Physically abused: Not on file    Forced sexual activity: Not on file  Other Topics Concern  . Not on file  Social History Narrative  . Not on file    FAMILY HISTORY: Family History  Problem Relation Age of Onset  . Heart disease Father   . Asthma Father   . Diabetes Father   . Heart disease Mother   . Heart disease Brother   . Hyperlipidemia Sister   . Hyperlipidemia Brother   . Hyperlipidemia Brother   . Cancer Neg Hx     ALLERGIES:  is allergic to sulfonamide derivatives; lipitor [atorvastatin]; pravachol [pravastatin sodium]; and statins.  MEDICATIONS:  Current Outpatient Medications  Medication Sig Dispense Refill  . acetaminophen (TYLENOL) 500 MG tablet Take 1,000 mg by mouth as needed for mild pain or headache.     . cholecalciferol (VITAMIN D) 1000 UNITS tablet Take 1,000 Units by mouth daily.    . fenofibrate 160 MG tablet Take 1 tablet (160 mg total) by mouth daily. 90 tablet 3  . ipratropium (ATROVENT) 0.06 % nasal spray Place 2 sprays into both nostrils every 4 (four) hours  as needed for rhinitis. (Patient not taking: Reported on 07/25/2018) 15 mL 5  . metoprolol tartrate (LOPRESSOR) 25 MG tablet Take 0.5 tablets (12.5 mg total) by mouth 2 (two) times daily. 180 tablet 3  . rivaroxaban (XARELTO) 20 MG TABS tablet Take 1 tablet (20 mg total) by mouth daily. 30 tablet 11   No current facility-administered medications for this visit.     REVIEW OF SYSTEMS:    A 10+ POINT REVIEW OF SYSTEMS WAS OBTAINED including neurology, dermatology, psychiatry, cardiac, respiratory, lymph, extremities, GI, GU, Musculoskeletal, constitutional, breasts, reproductive, HEENT.  All pertinent positives are noted in the HPI.  All others are negative.   PHYSICAL EXAMINATION: ECOG PERFORMANCE STATUS: 1 - Symptomatic but completely ambulatory  Vitals:   07/25/18 0948  BP: 110/70  Pulse: 94  Resp: 17  Temp: 98 F (36.7 C)  SpO2: 100%   Filed Weights   07/25/18 0948  Weight: 178 lb 4.8 oz (80.9 kg)   .Body mass index is 29 kg/m.  GENERAL:alert, in no acute distress and comfortable SKIN: no acute rashes, no significant lesions EYES: conjunctiva are pink and non-injected, sclera anicteric OROPHARYNX: MMM, no exudates, no oropharyngeal erythema or ulceration NECK: supple, no JVD LYMPH:  no palpable lymphadenopathy in the cervical, axillary or inguinal regions LUNGS: clear to auscultation b/l with normal respiratory effort HEART: irregular S1S2 ABDOMEN:  normoactive bowel sounds , non tender, not distended. No palpable hepatosplenomegaly.  Extremity: trace pedal edema PSYCH: alert & oriented x 3 with fluent speech NEURO: no focal motor/sensory deficits    LABORATORY DATA:  I have reviewed the data as listed  . CBC Latest Ref Rng & Units 07/18/2018 06/06/2018 05/31/2018  WBC 3.9 - 10.3 K/uL 4.7 8.3 8.4  Hemoglobin 11.6 - 15.9 g/dL 9.5(L) 10.3(L) 7.2(L)  Hematocrit 34.8 - 46.6 % 27.7(L) 29.6(L) 21.8(L)  Platelets 145 - 400 K/uL 480(H) 522.0(H) 492(H)    . CMP Latest Ref  Rng & Units 07/18/2018 05/31/2018 05/25/2018  Glucose 70 - 99 mg/dL 102(H) 112(H) 122(H)  BUN 8 - 23 mg/dL 19 30(H) 20  Creatinine 0.44 - 1.00 mg/dL 1.12(H) 1.37(H) 1.07(H)  Sodium 135 - 145 mmol/L 142 140 139  Potassium 3.5 - 5.1 mmol/L 4.1 4.0 4.8  Chloride 98 - 111 mmol/L 107 105 105  CO2 22 - 32 mmol/L '28 24 25  ' Calcium 8.9 - 10.3 mg/dL 9.1 9.0 9.1  Total Protein 6.5 - 8.1 g/dL 7.4 7.3 -  Total Bilirubin 0.3 - 1.2 mg/dL 2.3(H) 2.1(H) -  Alkaline Phos 38 - 126 U/L 35(L) 51 -  AST 15 - 41 U/L 25 22 -  ALT 0 - 44 U/L 19 13 -   05/31/18 Molecular Pathology:   05/24/18 Cytogenetics:     05/24/18 BM Bx:   05/24/18 BM Flow cytometry:     RADIOGRAPHIC STUDIES: I have personally reviewed the radiological images as listed and agreed with the findings in the report. No results found.  ASSESSMENT & PLAN:   72 y.o. female with  1. 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 No overt evidence of bleeding.  In the emergency room the patient had labs which showed hemoglobin of 6.6.5 with an MCV of 96.5.  Normal WBC count of 7.2k with elevated platelets of 685k which on repeat were down to 573k. Reticulocyte counts were relatively suppressed. LDH was elevated to 517 but haptoglobin was within normal limits at 45. Coombs negative Myeloma panel showed no M spike Ferritin level was elevated to 845 suggestive of acute phase reaction/inflammation. B12 and folate within normal limits.  Patient received 2 units of PRBCs with an appropriate bump in her hemoglobin level and felt much better.  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 Rt renal radiographic abnormality as noted in CT with some fluid collection. Unclear etiology Abdominal examination was completely benign. No fevers chills to suggest acute pyelonephritis or  renal abscess. Urine analysis showed trace leuk esterase and 6-10 WBCs no significant hematuria. Broad differential diagnosis based on CT findings as per radiologist. Procalcitonin levels unimpressive  05/23/18 CT Chest Abdomen Pelvis was done which showed mild splenomegaly without a focal splenic lesion. Multiple hepatic cysts.  Abnormal hypoenhancement in the right kidney lower pole of undetermined etiology -broad differential based on radiology input. right perirenal stranding is associated with a small amount of fluid in the adjacent right paracolic gutter, and a small fluid collection along the inferior margin of the liver which could be an exophytic cyst, small complex fluid collection, or conceivably a tumor nodule. No other significant other lymphadenopathy noted.   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   PLAN: -Discussed that the pt had No excess blasts; Acute leukemia and lymphoma are not indicated -Discussed the dysplastic changes observed in the patient's BM  -Discussed the treatment balance to suppress her platelet production with hydroxyurea while not making her more anemic -Continue with Urology referral as previously placed -If urology feels that infarction was likely in kidney, will further consider initiating treatment sooner -Discussed that her kidney concerns are complicating the understanding or her anemia's etiology, in the context of her recent BM findings of concern for myeloproliferative/ myelodysplastic neoplasm  -Acute phase reactants suggest cannot rule out malignancy or renal infarct or an infectious process.  -Pt is taking Xarelto which is protective for her JAK2 mutation related thrombocytosis. -Discussed that I would like to trend out her blood counts and monitor her labs for a little while before considering initiating treatment with hydroxyurea, and will support her  anemia as necessary with transfusions -Discussed pt labwork from 07/18/18;  HGB at 9.5, PLT reduced to 480k. Blood counts and chemistries are stable overall.  -07/18/18 Ferritin was at 445 -Discussed that if HGB improves towards 10, will consider low dose Hydroxyurea, however as HGB is at 9.5 and PLT are improved, will not begin Hydroxyurea at this time -Pt will let me know if she develops any new fatigue  -Will follow up with urology referral -Will see the pt back in 6 weeks, sooner if any new concerns    -Urology referral placed last visit- patient was not called with appointment -- needs to be followed up on ASAP -RTC with Dr Irene Limbo with labs in 6 weeks    All of the patients questions were answered with apparent satisfaction. The patient knows to call the clinic with any problems, questions or concerns.  The total time spent in the appt was 25 minutes and more than 50% was on counseling and direct patient cares.     Sullivan Lone MD MS AAHIVMS [facility] Hematology/Oncology Physician [facility]  (Office):       [phone] (Work cell):  [phone] (Fax):           [phone]  07/25/2018 10:17 AM  I, Baldwin Jamaica, am acting as a scribe for Dr. Irene Limbo  .I have reviewed the above documentation for accuracy and completeness, and I agree with the above. Brunetta Genera MD

## 2018-07-28 ENCOUNTER — Telehealth: Payer: Self-pay | Admitting: *Deleted

## 2018-07-28 NOTE — Telephone Encounter (Signed)
Medical records faxed to [facility]; release [id number]

## 2018-09-05 ENCOUNTER — Encounter: Payer: Self-pay | Admitting: Hematology

## 2018-09-05 ENCOUNTER — Telehealth: Payer: Self-pay | Admitting: Hematology

## 2018-09-05 ENCOUNTER — Inpatient Hospital Stay: Payer: Medicare Other | Attending: Hematology

## 2018-09-05 ENCOUNTER — Other Ambulatory Visit: Payer: Self-pay | Admitting: *Deleted

## 2018-09-05 ENCOUNTER — Inpatient Hospital Stay (HOSPITAL_BASED_OUTPATIENT_CLINIC_OR_DEPARTMENT_OTHER): Payer: Medicare Other | Admitting: Hematology

## 2018-09-05 VITALS — BP 116/74 | HR 83 | Temp 98.2°F | Resp 16 | Ht 65.0 in | Wt 177.3 lb

## 2018-09-05 DIAGNOSIS — Z7901 Long term (current) use of anticoagulants: Secondary | ICD-10-CM | POA: Diagnosis not present

## 2018-09-05 DIAGNOSIS — I480 Paroxysmal atrial fibrillation: Secondary | ICD-10-CM | POA: Diagnosis not present

## 2018-09-05 DIAGNOSIS — I10 Essential (primary) hypertension: Secondary | ICD-10-CM | POA: Diagnosis not present

## 2018-09-05 DIAGNOSIS — D461 Refractory anemia with ring sideroblasts: Secondary | ICD-10-CM | POA: Insufficient documentation

## 2018-09-05 DIAGNOSIS — D473 Essential (hemorrhagic) thrombocythemia: Secondary | ICD-10-CM

## 2018-09-05 DIAGNOSIS — D649 Anemia, unspecified: Secondary | ICD-10-CM

## 2018-09-05 DIAGNOSIS — E669 Obesity, unspecified: Secondary | ICD-10-CM | POA: Insufficient documentation

## 2018-09-05 DIAGNOSIS — Z1589 Genetic susceptibility to other disease: Secondary | ICD-10-CM

## 2018-09-05 DIAGNOSIS — D75839 Thrombocytosis, unspecified: Secondary | ICD-10-CM

## 2018-09-05 LAB — SAMPLE TO BLOOD BANK

## 2018-09-05 LAB — CMP (CANCER CENTER ONLY)
ALBUMIN: 3.9 g/dL (ref 3.5–5.0)
ALT: 19 U/L (ref 0–44)
AST: 35 U/L (ref 15–41)
Alkaline Phosphatase: 39 U/L (ref 38–126)
Anion gap: 8 (ref 5–15)
BILIRUBIN TOTAL: 2.3 mg/dL — AB (ref 0.3–1.2)
BUN: 17 mg/dL (ref 8–23)
CO2: 25 mmol/L (ref 22–32)
CREATININE: 0.98 mg/dL (ref 0.44–1.00)
Calcium: 8.9 mg/dL (ref 8.9–10.3)
Chloride: 110 mmol/L (ref 98–111)
GFR, EST NON AFRICAN AMERICAN: 56 mL/min — AB (ref >60)
GFR, Est AFR Am: >60 mL/min (ref >60)
GLUCOSE: 92 mg/dL (ref 70–99)
Potassium: 3.9 mmol/L (ref 3.5–5.1)
Sodium: 143 mmol/L (ref 135–145)
TOTAL PROTEIN: 6.9 g/dL (ref 6.5–8.1)

## 2018-09-05 LAB — CBC WITH DIFFERENTIAL/PLATELET
Abs Immature Granulocytes: 0.04 10*3/uL (ref 0.00–0.07)
Basophils Absolute: 0 10*3/uL (ref 0.0–0.1)
Basophils Relative: 1 %
EOS PCT: 2 %
Eosinophils Absolute: 0.1 10*3/uL (ref 0.0–0.5)
HCT: 22.3 % — ABNORMAL LOW (ref 36.0–46.0)
HEMOGLOBIN: 7.4 g/dL — AB (ref 12.0–15.0)
Immature Granulocytes: 1 %
LYMPHS PCT: 31 %
Lymphs Abs: 1.6 10*3/uL (ref 0.7–4.0)
MCH: 35.1 pg — AB (ref 26.0–34.0)
MCHC: 33.2 g/dL (ref 30.0–36.0)
MCV: 105.7 fL — ABNORMAL HIGH (ref 80.0–100.0)
MONO ABS: 0.2 10*3/uL (ref 0.1–1.0)
Monocytes Relative: 4 %
Neutro Abs: 3.2 10*3/uL (ref 1.7–7.7)
Neutrophils Relative %: 61 %
Platelets: 440 10*3/uL — ABNORMAL HIGH (ref 150–400)
RBC: 2.11 MIL/uL — AB (ref 3.87–5.11)
RDW: 18 % — ABNORMAL HIGH (ref 11.5–15.5)
WBC: 5.2 10*3/uL (ref 4.0–10.5)
nRBC: 0.8 % — ABNORMAL HIGH (ref 0.0–0.2)

## 2018-09-05 LAB — RETICULOCYTES
IMMATURE RETIC FRACT: 37 % — AB (ref 2.3–15.9)
RBC.: 0.78 MIL/uL — ABNORMAL LOW (ref 3.87–5.11)
RETIC COUNT ABSOLUTE: 79.9 10*3/uL (ref 19.0–186.0)
Retic Ct Pct: 10.3 % — ABNORMAL HIGH (ref 0.4–3.1)

## 2018-09-05 LAB — VITAMIN B12: VITAMIN B 12: 224 pg/mL (ref 180–914)

## 2018-09-05 LAB — LACTATE DEHYDROGENASE: LDH: 540 U/L — AB (ref 98–192)

## 2018-09-05 NOTE — Patient Instructions (Signed)
Thank you for choosing [facility] to provide your oncology and hematology care.  To afford each patient quality time with our providers, please arrive 30 minutes before your scheduled appointment time.  If you arrive late for your appointment, you may be asked to reschedule.  We strive to give you quality time with our providers, and arriving late affects you and other patients whose appointments are after yours.    If you are a no show for multiple scheduled visits, you may be dismissed from the clinic at the providers discretion.     Again, thank you for choosing [facility], our hope is that these requests will decrease the amount of time that you wait before being seen by our physicians.  ______________________________________________________________________   Should you have questions after your visit to the [facility], please contact our office at [phone] between the hours of 8:30 and 4:30 p.m.    Voicemails left after 4:30p.m will not be returned until the following business day.     For prescription refill requests, please have your pharmacy contact us directly.  Please also try to allow 48 hours for prescription requests.     Please contact the scheduling department for questions regarding scheduling.  For scheduling of procedures such as PET scans, CT scans, MRI, Ultrasound, etc please contact central scheduling at [phone].     Resources For Cancer Patients and Caregivers:    Oncolink.org:  A wonderful resource for patients and healthcare providers for information regarding your disease, ways to tract your treatment, what to expect, etc.      American Cancer Society:  800-227-2345  Can help patients locate various types of support and financial assistance   Cancer Care: 1-800-813-HOPE (4673) Provides financial assistance, online support groups, medication/co-pay assistance.     [place] DSS:  336-641-3447 Where to apply  for food stamps, Medicaid, and utility assistance   Medicare Rights Center: 800-333-4114 Helps people with Medicare understand their rights and benefits, navigate the Medicare system, and secure the quality healthcare they deserve   SCAT: 336-333-6589 [place] [employer]'s shared-ride transportation service for eligible riders who have a disability that prevents them from riding the fixed route bus.     For additional information on assistance programs please contact our social worker:   Abigail Elmore:  [phone]  

## 2018-09-05 NOTE — Telephone Encounter (Signed)
Gave pt avs and calendar  °

## 2018-09-05 NOTE — Progress Notes (Signed)
HEMATOLOGY/ONCOLOGY CONSULTATION NOTE  Date of Service: 09/05/2018  Patient Care Team: Chesley Noon, MD as PCP - General (Family Medicine)  CHIEF COMPLAINTS/PURPOSE OF CONSULTATION:  mx of MDS/MPN  HISTORY OF PRESENTING ILLNESS:   Alexandra Pierce is a wonderful 72 y.o. female who has been referred to [place] by Dr. Eliseo Squires for evaluation and management of symptomatic anemia.   Patient has history of hypertension, dyslipidemia, atrial fibrillation on anticoagulation with previous history of CVA x2, obesity who presented with generalized weakness.  Prior to admission she had an episode of blurring of vision especially in the right eye associated with a spell of confusion and possible loss of consciousness for a few seconds associated with disorientation.  She notes that she has also been feeling progressively fatigued for a few weeks and has had difficulty with dyspnea on exertion with short distances. Her daughters insisted she get evaluated and so she came to the emergency room for further evaluation. She does follow-up with cardiology and is being evaluated by pulmonary at the end of the month as well.  Patient previously has been on Coumadin from 2000 but was switched to Xarelto several years ago.  She notes no overt evidence of black stools or bleeding in the stools, no hematuria no nosebleeds no gum bleeds. Notes that she has been generally eating okay. Does endorse new night sweats over the last month or 2. No abdominal pain or significant change in bowel habits.  In the emergency room the patient had labs which showed hemoglobin of 6.6.5 with an MCV of 96.5.  Normal WBC count of 7.2k with elevated platelets of 685k which on repeat were down to 573k. Reticulocyte counts were relatively suppressed. LDH was elevated to 517 but haptoglobin was within normal limits at 45. Coombs negative Myeloma panel showed no M spike Ferritin level was elevated to 845 suggestive of acute phase  reaction/inflammation. B12 and folate within normal limits.  Patient received 2 units of PRBCs with an appropriate bump in her hemoglobin level and felt much better. CT chest abdomen pelvis was done which showed mild splenomegaly without a focal splenic lesion.  Multiple hepatic cysts.  Abnormal hypoenhancement in the right kidney lower pole of undetermined etiology -broad differential based on radiology input. right perirenal stranding is associated with a small amount of fluid in the adjacent right paracolic gutter, and a small fluid collection along the inferior margin of the liver which could be an exophytic cyst, small complex fluid collection, or conceivably a tumor nodule.  No other significant other lymphadenopathy noted.  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 The patient's last visit with [place] was on 07/25/18. The pt reports that she is doing well overall.   The pt reports that she was able to see her urologist in the interim, and the pt notes that her urologist felt that her kidney had an infarct and no intervention is necessary.   The pt notes that she has been feeling more tired recently. She denies any concern for blood loss, and denies blood in the stools. She notes that she has been more SOB when walking up stairs recently as well.    The pt denies any new bone pains, fevers, chills, or night sweats.   Lab results  today (09/05/18) of CBC w/diff is as follows: all values are WNL except for RBC at 2.11, HGB at 7.4, HCT at 22.3, MCV at 105.7, MCH at 35.1, RDW at 18.0, PLT at 440k, nRBC at 0.8%. 09/05/18 LDH is pending  On review of systems, pt reports feeling tired, eating well, staying well hydrated, and denies blood in the stools,  black stools, concerns for bleeding, fevers, chills, night sweats, new bone pains, abdominal pains, leg swelling, and any other symptoms.   MEDICAL HISTORY:  Past Medical History:  Diagnosis Date  . Chronic lower back pain   . Hypercholesteremia   . Hypertension    mild  . Obesity   . Paroxysmal atrial fibrillation (HCC)    managed with anticoagulation and rate control.   . Stroke [facility]) 2001 & 2015   "right side gets weak sometimes if I'm only tired" (03/16/2016)    SURGICAL HISTORY: Past Surgical History:  Procedure Laterality Date  . CARDIAC CATHETERIZATION  03/31/2000   EF 49%/LAD lg vessel which coursed to the apex & gave rise to 2 diagonal branches/ LAD noted to have 30% ostial lesion & tapers to a sm vessel toward the apex but no high grade lesion/1st diagonal med sized vessel & 2nd diagonal sm vessel with no significant disease/ lt ventriculogram reveals mild global hypokinesis w/ an estimated EF of 45-50%  . CARDIAC CATHETERIZATION N/A 03/17/2016   Procedure: Left Heart Cath and Coronary Angiography;  Surgeon: Wellington Hampshire, MD;  Location: [place] [facility];  Service: Cardiovascular;  Laterality: N/A;  . LEFT HEART CATH AND CORONARY ANGIOGRAPHY N/A 03/31/2018   Procedure: LEFT HEART CATH AND CORONARY ANGIOGRAPHY;  Surgeon: Martinique, Peter M, MD;  Location: [place] [facility];  Service: Cardiovascular;  Laterality: N/A;    SOCIAL HISTORY: Social History   Socioeconomic History  . Marital status: Married    Spouse name: Not on file  . Number of children: 2  . Years of education: Not on file  . Highest education level: Not on file  Occupational History  . Occupation: Herbalist  Social Needs  . Financial resource strain: Not on file  . Food insecurity:    Worry: Not on file    Inability: Not on file  . Transportation needs:    Medical: Not on file    Non-medical: Not on file  Tobacco Use  . Smoking status: Former Smoker    Packs/day: 0.12    Years: 15.00     Pack years: 1.80    Types: Cigarettes    Last attempt to quit: 10/18/1978    Years since quitting: 39.9  . Smokeless tobacco: Never Used  Substance and Sexual Activity  . Alcohol use: Yes    Alcohol/week: 14.0 standard drinks    Types: 14 Glasses of wine per week  . Drug use: No  . Sexual activity: Yes  Lifestyle  . Physical activity:    Days per week: Not on file    Minutes per session: Not on file  . Stress: Not on file  Relationships  . Social connections:    Talks on phone: Not on file    Gets together: Not on file    Attends religious service: Not on file    Active member of club or organization: Not on file    Attends meetings of clubs or organizations: Not on file    Relationship status: Not on file  . Intimate partner violence:    Fear of current or ex  partner: Not on file    Emotionally abused: Not on file    Physically abused: Not on file    Forced sexual activity: Not on file  Other Topics Concern  . Not on file  Social History Narrative  . Not on file    FAMILY HISTORY: Family History  Problem Relation Age of Onset  . Heart disease Father   . Asthma Father   . Diabetes Father   . Heart disease Mother   . Heart disease Brother   . Hyperlipidemia Sister   . Hyperlipidemia Brother   . Hyperlipidemia Brother   . Cancer Neg Hx     ALLERGIES:  is allergic to sulfonamide derivatives; lipitor [atorvastatin]; pravachol [pravastatin sodium]; and statins.  MEDICATIONS:  Current Outpatient Medications  Medication Sig Dispense Refill  . acetaminophen (TYLENOL) 500 MG tablet Take 1,000 mg by mouth as needed for mild pain or headache.     . cholecalciferol (VITAMIN D) 1000 UNITS tablet Take 1,000 Units by mouth daily.    . fenofibrate 160 MG tablet Take 1 tablet (160 mg total) by mouth daily. 90 tablet 3  . ipratropium (ATROVENT) 0.06 % nasal spray Place 2 sprays into both nostrils every 4 (four) hours as needed for rhinitis. (Patient not taking: Reported on  07/25/2018) 15 mL 5  . metoprolol tartrate (LOPRESSOR) 25 MG tablet Take 0.5 tablets (12.5 mg total) by mouth 2 (two) times daily. 180 tablet 3  . rivaroxaban (XARELTO) 20 MG TABS tablet Take 1 tablet (20 mg total) by mouth daily. 30 tablet 11   No current facility-administered medications for this visit.     REVIEW OF SYSTEMS:    A 10+ POINT REVIEW OF SYSTEMS WAS OBTAINED including neurology, dermatology, psychiatry, cardiac, respiratory, lymph, extremities, GI, GU, Musculoskeletal, constitutional, breasts, reproductive, HEENT.  All pertinent positives are noted in the HPI.  All others are negative.   PHYSICAL EXAMINATION: ECOG PERFORMANCE STATUS: 1 - Symptomatic but completely ambulatory  There were no vitals filed for this visit. There were no vitals filed for this visit. .There is no height or weight on file to calculate BMI.  GENERAL:alert, in no acute distress and comfortable SKIN: no acute rashes, no significant lesions EYES: conjunctiva are pink and non-injected, sclera anicteric OROPHARYNX: MMM, no exudates, no oropharyngeal erythema or ulceration NECK: supple, no JVD LYMPH:  no palpable lymphadenopathy in the cervical, axillary or inguinal regions LUNGS: clear to auscultation b/l with normal respiratory effort HEART: irregular S1S2 ABDOMEN:  normoactive bowel sounds , non tender, not distended. No palpable hepatosplenomegaly.  Extremity: trace pedal edema PSYCH: alert & oriented x 3 with fluent speech NEURO: no focal motor/sensory deficits    LABORATORY DATA:  I have reviewed the data as listed  . CBC Latest Ref Rng & Units 07/18/2018 06/06/2018 05/31/2018  WBC 3.9 - 10.3 K/uL 4.7 8.3 8.4  Hemoglobin 11.6 - 15.9 g/dL 9.5(L) 10.3(L) 7.2(L)  Hematocrit 34.8 - 46.6 % 27.7(L) 29.6(L) 21.8(L)  Platelets 145 - 400 K/uL 480(H) 522.0(H) 492(H)    . CMP Latest Ref Rng & Units 07/18/2018 05/31/2018 05/25/2018  Glucose 70 - 99 mg/dL 102(H) 112(H) 122(H)  BUN 8 - 23 mg/dL 19 30(H)  20  Creatinine 0.44 - 1.00 mg/dL 1.12(H) 1.37(H) 1.07(H)  Sodium 135 - 145 mmol/L 142 140 139  Potassium 3.5 - 5.1 mmol/L 4.1 4.0 4.8  Chloride 98 - 111 mmol/L 107 105 105  CO2 22 - 32 mmol/L 28 24 25   Calcium 8.9 - 10.3 mg/dL  9.1 9.0 9.1  Total Protein 6.5 - 8.1 g/dL 7.4 7.3 -  Total Bilirubin 0.3 - 1.2 mg/dL 2.3(H) 2.1(H) -  Alkaline Phos 38 - 126 U/L 35(L) 51 -  AST 15 - 41 U/L 25 22 -  ALT 0 - 44 U/L 19 13 -   05/31/18 Molecular Pathology:   05/24/18 Cytogenetics:     05/24/18 BM Bx:   05/24/18 BM Flow cytometry:     RADIOGRAPHIC STUDIES: I have personally reviewed the radiological images as listed and agreed with the findings in the report. No results found.  ASSESSMENT & PLAN:   72 y.o. female with  1. 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 No overt evidence of bleeding.  In the emergency room the patient had labs which showed hemoglobin of 6.6.5 with an MCV of 96.5.  Normal WBC count of 7.2k with elevated platelets of 685k which on repeat were down to 573k. Reticulocyte counts were relatively suppressed. LDH was elevated to 517 but haptoglobin was within normal limits at 45. Coombs negative Myeloma panel showed no M spike Ferritin level was elevated to 845 suggestive of acute phase reaction/inflammation. B12 and folate within normal limits.  Patient received 2 units of PRBCs with an appropriate bump in her hemoglobin level and felt much better.  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 Rt renal radiographic abnormality as noted in CT with some fluid collection. Unclear etiology Abdominal examination was completely benign. No fevers chills to suggest acute pyelonephritis or renal abscess. Urine analysis showed trace leuk esterase and 6-10 WBCs no significant hematuria. Broad  differential diagnosis based on CT findings as per radiologist. Procalcitonin levels unimpressive  05/23/18 CT Chest Abdomen Pelvis was done which showed mild splenomegaly without a focal splenic lesion. Multiple hepatic cysts.  Abnormal hypoenhancement in the right kidney lower pole of undetermined etiology -broad differential based on radiology input. right perirenal stranding is associated with a small amount of fluid in the adjacent right paracolic gutter, and a small fluid collection along the inferior margin of the liver which could be an exophytic cyst, small complex fluid collection, or conceivably a tumor nodule. No other significant other lymphadenopathy noted.   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   PLAN: -Discussed that the pt had No excess blasts; Acute leukemia and lymphoma are not indicated -Discussed the dysplastic changes observed in the patient's BM  -Discussed the treatment balance to suppress her platelet production with hydroxyurea while not making her more anemic -Continue with Urology referral as previously placed -If urology feels that infarction was likely in kidney, will further consider initiating treatment sooner -Discussed that her kidney concerns are complicating the understanding or her anemia's etiology, in the context of her recent BM findings of concern for myeloproliferative/ myelodysplastic neoplasm  -Acute phase reactants suggest cannot rule out malignancy or renal infarct or an infectious process.  -Pt is taking Xarelto which is protective for her JAK2 mutation related thrombocytosis. -Discussed that I would like to trend out her blood counts and monitor her labs for a little while before considering initiating treatment with hydroxyurea, and will support her anemia as necessary with transfusions -Discussed that if HGB improves towards 10, will consider low  dose Hydroxyurea -Pt will let me know if she develops any new fatigue  -Discussed pt labwork today,  09/05/18; HGB decreased to 7.4, PLT increased to 440k -Will continue to support patient with transfusions -Will order 2 units PRBCs -Advised that the pt let me know if she feels symptoms of her anemia between visits in the future    RTC with Dr Irene Limbo in 1 month with labs   All of the patients questions were answered with apparent satisfaction. The patient knows to call the clinic with any problems, questions or concerns.  The total time spent in the appt was 25 minutes and more than 50% was on counseling and direct patient cares.     Sullivan Lone MD MS AAHIVMS [facility] Hematology/Oncology Physician [facility]  (Office):       [phone] (Work cell):  [phone] (Fax):           [phone]  09/05/2018 8:13 AM  I, Baldwin Jamaica, am acting as a scribe for Dr. Sullivan Lone.   .I have reviewed the above documentation for accuracy and completeness, and I agree with the above. Brunetta Genera MD

## 2018-09-08 ENCOUNTER — Inpatient Hospital Stay: Payer: Medicare Other

## 2018-09-08 ENCOUNTER — Other Ambulatory Visit: Payer: Self-pay | Admitting: *Deleted

## 2018-09-08 ENCOUNTER — Telehealth: Payer: Self-pay | Admitting: *Deleted

## 2018-09-08 DIAGNOSIS — D649 Anemia, unspecified: Secondary | ICD-10-CM

## 2018-09-08 DIAGNOSIS — D461 Refractory anemia with ring sideroblasts: Secondary | ICD-10-CM | POA: Diagnosis not present

## 2018-09-08 NOTE — Telephone Encounter (Signed)
Error

## 2018-09-09 ENCOUNTER — Inpatient Hospital Stay: Payer: Medicare Other

## 2018-09-09 DIAGNOSIS — D461 Refractory anemia with ring sideroblasts: Secondary | ICD-10-CM | POA: Diagnosis not present

## 2018-09-09 DIAGNOSIS — D649 Anemia, unspecified: Secondary | ICD-10-CM

## 2018-09-09 LAB — PREPARE RBC (CROSSMATCH)

## 2018-09-09 MED ORDER — ACETAMINOPHEN 325 MG PO TABS
ORAL_TABLET | ORAL | Status: AC
  Filled 2018-09-09: qty 2

## 2018-09-09 MED ORDER — DIPHENHYDRAMINE HCL 25 MG PO CAPS
25.0000 mg | ORAL_CAPSULE | Freq: Once | ORAL | Status: AC
  Administered 2018-09-09: 25 mg via ORAL

## 2018-09-09 MED ORDER — DIPHENHYDRAMINE HCL 25 MG PO CAPS
ORAL_CAPSULE | ORAL | Status: AC
  Filled 2018-09-09: qty 1

## 2018-09-09 MED ORDER — ACETAMINOPHEN 325 MG PO TABS
650.0000 mg | ORAL_TABLET | Freq: Once | ORAL | Status: AC
  Administered 2018-09-09: 650 mg via ORAL

## 2018-09-09 MED ORDER — SODIUM CHLORIDE 0.9% IV SOLUTION
250.0000 mL | Freq: Once | INTRAVENOUS | Status: AC
  Administered 2018-09-09: 250 mL via INTRAVENOUS
  Filled 2018-09-09: qty 250

## 2018-09-09 MED ORDER — HEPARIN SOD (PORK) LOCK FLUSH 100 UNIT/ML IV SOLN
500.0000 [IU] | Freq: Every day | INTRAVENOUS | Status: DC | PRN
  Filled 2018-09-09: qty 5

## 2018-09-09 MED ORDER — SODIUM CHLORIDE 0.9% FLUSH
10.0000 mL | INTRAVENOUS | Status: DC | PRN
  Filled 2018-09-09: qty 10

## 2018-09-09 NOTE — Patient Instructions (Signed)

## 2018-09-10 LAB — BPAM RBC
Blood Product Expiration Date: 201912202359
Blood Product Expiration Date: 201912212359
ISSUE DATE / TIME: 201911230753
ISSUE DATE / TIME: 201911230753
UNIT TYPE AND RH: 6200
Unit Type and Rh: 6200

## 2018-09-10 LAB — TYPE AND SCREEN
ABO/RH(D): A POS
Antibody Screen: NEGATIVE
Unit division: 0
Unit division: 0

## 2018-09-27 ENCOUNTER — Telehealth: Payer: Self-pay | Admitting: *Deleted

## 2018-09-27 ENCOUNTER — Telehealth: Payer: Self-pay | Admitting: Hematology

## 2018-09-27 NOTE — Telephone Encounter (Signed)
Patient states feels weak and tired. Thinks she might need blood work to check and see if it's low. Had blood on 11/23.

## 2018-09-27 NOTE — Telephone Encounter (Signed)
Called to say she was feeling tired. Wondered if blood count was low again and did she need blood work. Contacted patient. States she is tired, but not as tired as when she needed blood in November. Per Dr. Irene Limbo, make appt for patient and include labs. Arrange for blood transfusion following labs. Contacted patient to let her know about appointments and to advise that if fatigue increased before appt time, please contact office immediately. Patient verbalized understanding.

## 2018-09-27 NOTE — Telephone Encounter (Signed)
Scheduled appt per 12/11 sch message- and [place] request for blood on 12/21

## 2018-10-05 NOTE — Progress Notes (Signed)
HEMATOLOGY/ONCOLOGY CONSULTATION NOTE  Date of Service: 10/06/2018  Patient Care Team: Chesley Noon, MD as PCP - General (Family Medicine)  CHIEF COMPLAINTS/PURPOSE OF CONSULTATION:  mx of MDS/MPN  HISTORY OF PRESENTING ILLNESS:   Alexandra Pierce is a wonderful 72 y.o. female who has been referred to [place] by Dr. Eliseo Squires for evaluation and management of symptomatic anemia.   Patient has history of hypertension, dyslipidemia, atrial fibrillation on anticoagulation with previous history of CVA x2, obesity who presented with generalized weakness.  Prior to admission she had an episode of blurring of vision especially in the right eye associated with a spell of confusion and possible loss of consciousness for a few seconds associated with disorientation.  She notes that she has also been feeling progressively fatigued for a few weeks and has had difficulty with dyspnea on exertion with short distances. Her daughters insisted she get evaluated and so she came to the emergency room for further evaluation. She does follow-up with cardiology and is being evaluated by pulmonary at the end of the month as well.  Patient previously has been on Coumadin from 2000 but was switched to Xarelto several years ago.  She notes no overt evidence of black stools or bleeding in the stools, no hematuria no nosebleeds no gum bleeds. Notes that she has been generally eating okay. Does endorse new night sweats over the last month or 2. No abdominal pain or significant change in bowel habits.  In the emergency room the patient had labs which showed hemoglobin of 6.6.5 with an MCV of 96.5.  Normal WBC count of 7.2k with elevated platelets of 685k which on repeat were down to 573k. Reticulocyte counts were relatively suppressed. LDH was elevated to 517 but haptoglobin was within normal limits at 45. Coombs negative Myeloma panel showed no M spike Ferritin level was elevated to 845 suggestive of acute phase  reaction/inflammation. B12 and folate within normal limits.  Patient received 2 units of PRBCs with an appropriate bump in her hemoglobin level and felt much better. CT chest abdomen pelvis was done which showed mild splenomegaly without a focal splenic lesion.  Multiple hepatic cysts.  Abnormal hypoenhancement in the right kidney lower pole of undetermined etiology -broad differential based on radiology input. right perirenal stranding is associated with a small amount of fluid in the adjacent right paracolic gutter, and a small fluid collection along the inferior margin of the liver which could be an exophytic cyst, small complex fluid collection, or conceivably a tumor nodule.  No other significant other lymphadenopathy noted.  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 The patient's last visit with [place] was on 09/05/18. The pt reports that she is doing well overall.   The pt reports that her last blood transfusion a month ago was very helpful for her energy level, however this began wearing off about a week ago. She notes that she has been taking afternoon naps in the past week but she has continued to be active. She also feels that her legs are restless before bed and when she is at rest as well. She denies any back pains or abdominal pains. She believes that she continues to function well and has been eating well.   Lab results today (10/06/18) of  CBC w/diff is as follows: all values are WNL except for RBC at 2.56, HGB at 8.6, HCT at 26.4, MCV at 103.1, RDW at 18.3, PLT at 403k, nRBC at 0.5%.  On review of systems, pt reports feeling tired, restless legs, eating well, moving her bowels well, and denies back pains, abdominal pains, concerns for  bleeding, flank pains, lower abdominal pains, leg swelling, problems passing urine, and any other symptoms.    MEDICAL HISTORY:  Past Medical History:  Diagnosis Date  . Chronic lower back pain   . Hypercholesteremia   . Hypertension    mild  . Obesity   . Paroxysmal atrial fibrillation (HCC)    managed with anticoagulation and rate control.   . Stroke [facility]) 2001 & 2015   "right side gets weak sometimes if I'm only tired" (03/16/2016)    SURGICAL HISTORY: Past Surgical History:  Procedure Laterality Date  . CARDIAC CATHETERIZATION  03/31/2000   EF 49%/LAD lg vessel which coursed to the apex & gave rise to 2 diagonal branches/ LAD noted to have 30% ostial lesion & tapers to a sm vessel toward the apex but no high grade lesion/1st diagonal med sized vessel & 2nd diagonal sm vessel with no significant disease/ lt ventriculogram reveals mild global hypokinesis w/ an estimated EF of 45-50%  . CARDIAC CATHETERIZATION N/A 03/17/2016   Procedure: Left Heart Cath and Coronary Angiography;  Surgeon: Wellington Hampshire, MD;  Location: [place] [facility];  Service: Cardiovascular;  Laterality: N/A;  . LEFT HEART CATH AND CORONARY ANGIOGRAPHY N/A 03/31/2018   Procedure: LEFT HEART CATH AND CORONARY ANGIOGRAPHY;  Surgeon: Martinique, Peter M, MD;  Location: [place] [facility];  Service: Cardiovascular;  Laterality: N/A;    SOCIAL HISTORY: Social History   Socioeconomic History  . Marital status: Married    Spouse name: Not on file  . Number of children: 2  . Years of education: Not on file  . Highest education level: Not on file  Occupational History  . Occupation: Herbalist  Social Needs  . Financial resource strain: Not on file  . Food insecurity:    Worry: Not on file    Inability: Not on file  . Transportation needs:    Medical: Not on file    Non-medical: Not on file  Tobacco Use  . Smoking status: Former Smoker    Packs/day: 0.12    Years: 15.00    Pack years: 1.80    Types:  Cigarettes    Last attempt to quit: 10/18/1978    Years since quitting: 39.9  . Smokeless tobacco: Never Used  Substance and Sexual Activity  . Alcohol use: Yes    Alcohol/week: 14.0 standard drinks    Types: 14 Glasses of wine per week  . Drug use: No  . Sexual activity: Yes  Lifestyle  . Physical activity:    Days per week: Not on file    Minutes per session: Not on file  . Stress: Not on file  Relationships  . Social connections:    Talks on phone: Not on file    Gets together: Not on file    Attends religious service: Not on file    Active member of club or organization: Not on file    Attends meetings of clubs or organizations: Not on file    Relationship status: Not on file  . Intimate partner violence:    Fear of current or ex partner: Not on file    Emotionally abused:  Not on file    Physically abused: Not on file    Forced sexual activity: Not on file  Other Topics Concern  . Not on file  Social History Narrative  . Not on file    FAMILY HISTORY: Family History  Problem Relation Age of Onset  . Heart disease Father   . Asthma Father   . Diabetes Father   . Heart disease Mother   . Heart disease Brother   . Hyperlipidemia Sister   . Hyperlipidemia Brother   . Hyperlipidemia Brother   . Cancer Neg Hx     ALLERGIES:  is allergic to sulfonamide derivatives; lipitor [atorvastatin]; pravachol [pravastatin sodium]; and statins.  MEDICATIONS:  Current Outpatient Medications  Medication Sig Dispense Refill  . acetaminophen (TYLENOL) 500 MG tablet Take 1,000 mg by mouth as needed for mild pain or headache.     . cholecalciferol (VITAMIN D) 1000 UNITS tablet Take 1,000 Units by mouth daily.    . fenofibrate 160 MG tablet Take 1 tablet (160 mg total) by mouth daily. 90 tablet 3  . ipratropium (ATROVENT) 0.06 % nasal spray Place 2 sprays into both nostrils every 4 (four) hours as needed for rhinitis. (Patient not taking: Reported on 07/25/2018) 15 mL 5  . metoprolol  tartrate (LOPRESSOR) 25 MG tablet Take 0.5 tablets (12.5 mg total) by mouth 2 (two) times daily. 180 tablet 3  . rivaroxaban (XARELTO) 20 MG TABS tablet Take 1 tablet (20 mg total) by mouth daily. 30 tablet 11   No current facility-administered medications for this visit.     REVIEW OF SYSTEMS:    A 10+ POINT REVIEW OF SYSTEMS WAS OBTAINED including neurology, dermatology, psychiatry, cardiac, respiratory, lymph, extremities, GI, GU, Musculoskeletal, constitutional, breasts, reproductive, HEENT.  All pertinent positives are noted in the HPI.  All others are negative.   PHYSICAL EXAMINATION: ECOG PERFORMANCE STATUS: 1 - Symptomatic but completely ambulatory  Vitals:   10/06/18 1152  BP: 126/71  Pulse: 84  Resp: 18  Temp: 98.1 F (36.7 C)  SpO2: 100%   Filed Weights   10/06/18 1152  Weight: 176 lb 9.6 oz (80.1 kg)   .Body mass index is 29.39 kg/m.  GENERAL:alert, in no acute distress and comfortable SKIN: no acute rashes, no significant lesions EYES: conjunctiva are pink and non-injected, sclera anicteric OROPHARYNX: MMM, no exudates, no oropharyngeal erythema or ulceration NECK: supple, no JVD LYMPH:  no palpable lymphadenopathy in the cervical, axillary or inguinal regions LUNGS: clear to auscultation b/l with normal respiratory effort HEART: Irregular S1S2 ABDOMEN:  normoactive bowel sounds , non tender, not distended. No palpable hepatosplenomegaly.  Extremity: trace pedal edema PSYCH: alert & oriented x 3 with fluent speech NEURO: no focal motor/sensory deficits    LABORATORY DATA:  I have reviewed the data as listed  . CBC Latest Ref Rng & Units 10/06/2018 09/05/2018 07/18/2018  WBC 4.0 - 10.5 K/uL 4.4 5.2 4.7  Hemoglobin 12.0 - 15.0 g/dL 8.6(L) 7.4(L) 9.5(L)  Hematocrit 36.0 - 46.0 % 26.4(L) 22.3(L) 27.7(L)  Platelets 150 - 400 K/uL 403(H) 440(H) 480(H)    . CMP Latest Ref Rng & Units 09/05/2018 07/18/2018 05/31/2018  Glucose 70 - 99 mg/dL 92 102(H) 112(H)    BUN 8 - 23 mg/dL 17 19 30(H)  Creatinine 0.44 - 1.00 mg/dL 0.98 1.12(H) 1.37(H)  Sodium 135 - 145 mmol/L 143 142 140  Potassium 3.5 - 5.1 mmol/L 3.9 4.1 4.0  Chloride 98 - 111 mmol/L 110 107 105  CO2 22 -  32 mmol/L 25 28 24   Calcium 8.9 - 10.3 mg/dL 8.9 9.1 9.0  Total Protein 6.5 - 8.1 g/dL 6.9 7.4 7.3  Total Bilirubin 0.3 - 1.2 mg/dL 2.3(H) 2.3(H) 2.1(H)  Alkaline Phos 38 - 126 U/L 39 35(L) 51  AST 15 - 41 U/L 35 25 22  ALT 0 - 44 U/L 19 19 13    05/31/18 Molecular Pathology:   05/24/18 Cytogenetics:     05/24/18 BM Bx:   05/24/18 BM Flow cytometry:     RADIOGRAPHIC STUDIES: I have personally reviewed the radiological images as listed and agreed with the findings in the report. No results found.  ASSESSMENT & PLAN:   72 y.o. female with  1. 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 No overt evidence of bleeding.  In the emergency room the patient had labs which showed hemoglobin of 6.6.5 with an MCV of 96.5.  Normal WBC count of 7.2k with elevated platelets of 685k which on repeat were down to 573k. Reticulocyte counts were relatively suppressed. LDH was elevated to 517 but haptoglobin was within normal limits at 45. Coombs negative Myeloma panel showed no M spike Ferritin level was elevated to 845 suggestive of acute phase reaction/inflammation. B12 and folate within normal limits.  Patient received 2 units of PRBCs with an appropriate bump in her hemoglobin level and felt much better.  05/24/18 BM Bx results which revealed erythroid and megakaryocytic proliferation and ring sideroblasts concerning for myeloproliferative/ myelodysplastic neoplasm   No excess blasts; Acute leukemia and lymphoma are not indicated   2. Rt renal radiographic abnormality as noted in CT with some fluid collection. 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 No fevers chills to suggest acute pyelonephritis or  renal abscess. Urine analysis showed trace leuk esterase and 6-10 WBCs no significant hematuria. Broad differential diagnosis based on CT findings as per radiologist. Procalcitonin levels unimpressive  05/23/18 CT Chest Abdomen Pelvis was done which showed mild splenomegaly without a focal splenic lesion. Multiple hepatic cysts.  Abnormal hypoenhancement in the right kidney lower pole of undetermined etiology -broad differential based on radiology input. right perirenal stranding is associated with a small amount of fluid in the adjacent right paracolic gutter, and a small fluid collection along the inferior margin of the liver which could be an exophytic cyst, small complex fluid collection, or conceivably a tumor nodule. No other significant other lymphadenopathy noted.   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   PLAN: -Discussed pt labwork today, 10/06/18; HGB at 8.6, PLT near normalized to 405k -Pt will proceed with blood transfusion tomorrow  -Discussed the dysplastic changes observed in the patient's BM  -Discussed the treatment balance to suppress her platelet production with hydroxyurea while not making her more anemic -Discussed that her kidney concerns are complicating the understanding or her anemia's etiology, in the context of her recent BM findings of concern for myeloproliferative/ myelodysplastic neoplasm  -Acute phase reactants suggest cannot rule out malignancy or renal infarct or an infectious process.  -Pt is taking Xarelto which is protective for her JAK2 mutation related thrombocytosis. -Discussed that I would like to trend out her blood counts and monitor her labs for a little while before considering initiating treatment with hydroxyurea, and will support her anemia as necessary with transfusions -Discussed that if HGB improves towards 10, will consider low dose  Hydroxyurea -Will continue to support patient with transfusions -  Will see the pt back in 4 weeks    RTC with Dr Irene Limbo with labs and appointment for 2 units of PRBC in 1 month    All of the patients questions were answered with apparent satisfaction. The patient knows to call the clinic with any problems, questions or concerns.  The total time spent in the appt was 20 minutes and more than 50% was on counseling and direct patient cares.    Sullivan Lone MD MS AAHIVMS [facility] Hematology/Oncology Physician [facility]  (Office):       [phone] (Work cell):  [phone] (Fax):           [phone]  10/06/2018 12:52 PM  I, Baldwin Jamaica, am acting as a scribe for Dr. Sullivan Lone.   .I have reviewed the above documentation for accuracy and completeness, and I agree with the above. Brunetta Genera MD

## 2018-10-06 ENCOUNTER — Other Ambulatory Visit: Payer: Self-pay

## 2018-10-06 ENCOUNTER — Other Ambulatory Visit: Payer: Self-pay | Admitting: *Deleted

## 2018-10-06 ENCOUNTER — Telehealth: Payer: Self-pay | Admitting: *Deleted

## 2018-10-06 ENCOUNTER — Inpatient Hospital Stay: Payer: Medicare Other | Attending: Hematology | Admitting: Hematology

## 2018-10-06 ENCOUNTER — Inpatient Hospital Stay: Payer: Medicare Other

## 2018-10-06 VITALS — BP 126/71 | HR 84 | Temp 98.1°F | Resp 18 | Ht 65.0 in | Wt 176.6 lb

## 2018-10-06 DIAGNOSIS — D473 Essential (hemorrhagic) thrombocythemia: Secondary | ICD-10-CM

## 2018-10-06 DIAGNOSIS — R93421 Abnormal radiologic findings on diagnostic imaging of right kidney: Secondary | ICD-10-CM | POA: Insufficient documentation

## 2018-10-06 DIAGNOSIS — D75839 Thrombocytosis, unspecified: Secondary | ICD-10-CM

## 2018-10-06 DIAGNOSIS — D649 Anemia, unspecified: Secondary | ICD-10-CM

## 2018-10-06 DIAGNOSIS — R161 Splenomegaly, not elsewhere classified: Secondary | ICD-10-CM

## 2018-10-06 DIAGNOSIS — D461 Refractory anemia with ring sideroblasts: Secondary | ICD-10-CM

## 2018-10-06 DIAGNOSIS — D469 Myelodysplastic syndrome, unspecified: Secondary | ICD-10-CM | POA: Insufficient documentation

## 2018-10-06 DIAGNOSIS — Z79899 Other long term (current) drug therapy: Secondary | ICD-10-CM

## 2018-10-06 LAB — CBC WITH DIFFERENTIAL (CANCER CENTER ONLY)
ABS IMMATURE GRANULOCYTES: 0.03 10*3/uL (ref 0.00–0.07)
BASOS PCT: 1 %
Basophils Absolute: 0.1 10*3/uL (ref 0.0–0.1)
EOS ABS: 0.1 10*3/uL (ref 0.0–0.5)
Eosinophils Relative: 2 %
HCT: 26.4 % — ABNORMAL LOW (ref 36.0–46.0)
Hemoglobin: 8.6 g/dL — ABNORMAL LOW (ref 12.0–15.0)
Immature Granulocytes: 1 %
Lymphocytes Relative: 35 %
Lymphs Abs: 1.5 10*3/uL (ref 0.7–4.0)
MCH: 33.6 pg (ref 26.0–34.0)
MCHC: 32.6 g/dL (ref 30.0–36.0)
MCV: 103.1 fL — ABNORMAL HIGH (ref 80.0–100.0)
MONO ABS: 0.2 10*3/uL (ref 0.1–1.0)
MONOS PCT: 4 %
Neutro Abs: 2.5 10*3/uL (ref 1.7–7.7)
Neutrophils Relative %: 57 %
PLATELETS: 403 10*3/uL — AB (ref 150–400)
RBC: 2.56 MIL/uL — ABNORMAL LOW (ref 3.87–5.11)
RDW: 18.3 % — ABNORMAL HIGH (ref 11.5–15.5)
WBC Count: 4.4 10*3/uL (ref 4.0–10.5)
nRBC: 0.5 % — ABNORMAL HIGH (ref 0.0–0.2)

## 2018-10-06 NOTE — Telephone Encounter (Signed)
Contacted patient to let her know results of Hgb this AM and to tell her that she is only receiving 1 unit of blood tomorrow. Pt verbalized understanding.

## 2018-10-07 ENCOUNTER — Inpatient Hospital Stay: Payer: Medicare Other

## 2018-10-07 ENCOUNTER — Other Ambulatory Visit: Payer: Self-pay

## 2018-10-07 DIAGNOSIS — R161 Splenomegaly, not elsewhere classified: Secondary | ICD-10-CM

## 2018-10-07 DIAGNOSIS — D461 Refractory anemia with ring sideroblasts: Secondary | ICD-10-CM

## 2018-10-07 DIAGNOSIS — D75839 Thrombocytosis, unspecified: Secondary | ICD-10-CM

## 2018-10-07 DIAGNOSIS — D469 Myelodysplastic syndrome, unspecified: Secondary | ICD-10-CM | POA: Diagnosis not present

## 2018-10-07 DIAGNOSIS — D649 Anemia, unspecified: Secondary | ICD-10-CM

## 2018-10-07 DIAGNOSIS — D473 Essential (hemorrhagic) thrombocythemia: Secondary | ICD-10-CM

## 2018-10-07 LAB — PREPARE RBC (CROSSMATCH)

## 2018-10-07 LAB — SAMPLE TO BLOOD BANK

## 2018-10-07 MED ORDER — SODIUM CHLORIDE 0.9 % IV SOLN
INTRAVENOUS | Status: DC
  Administered 2018-10-07: 08:00:00 via INTRAVENOUS
  Filled 2018-10-07: qty 250

## 2018-10-07 MED ORDER — DIPHENHYDRAMINE HCL 25 MG PO CAPS
ORAL_CAPSULE | ORAL | Status: AC
  Filled 2018-10-07: qty 1

## 2018-10-07 MED ORDER — ACETAMINOPHEN 325 MG PO TABS
ORAL_TABLET | ORAL | Status: AC
  Filled 2018-10-07: qty 2

## 2018-10-07 MED ORDER — DIPHENHYDRAMINE HCL 25 MG PO CAPS
25.0000 mg | ORAL_CAPSULE | Freq: Once | ORAL | Status: AC
  Administered 2018-10-07: 25 mg via ORAL

## 2018-10-07 MED ORDER — ACETAMINOPHEN 325 MG PO TABS
650.0000 mg | ORAL_TABLET | Freq: Once | ORAL | Status: AC
  Administered 2018-10-07: 650 mg via ORAL

## 2018-10-07 NOTE — Patient Instructions (Signed)
Blood Transfusion, Adult, Care After This sheet gives you information about how to care for yourself after your procedure. Your doctor may also give you more specific instructions. If you have problems or questions, contact your doctor. Follow these instructions at home:   Take over-the-counter and prescription medicines only as told by your doctor.  Go back to your normal activities as told by your doctor.  Follow instructions from your doctor about how to take care of the area where an IV tube was put into your vein (insertion site). Make sure you: ? Wash your hands with soap and water before you change your bandage (dressing). If there is no soap and water, use hand sanitizer. ? Change your bandage as told by your doctor.  Check your IV insertion site every day for signs of infection. Check for: ? More redness, swelling, or pain. ? More fluid or blood. ? Warmth. ? Pus or a bad smell. Contact a doctor if:  You have more redness, swelling, or pain around the IV insertion site.  You have more fluid or blood coming from the IV insertion site.  Your IV insertion site feels warm to the touch.  You have pus or a bad smell coming from the IV insertion site.  Your pee (urine) turns pink, red, or brown.  You feel weak after doing your normal activities. Get help right away if:  You have signs of a serious allergic or body defense (immune) system reaction, including: ? Itchiness. ? Hives. ? Trouble breathing. ? Anxiety. ? Pain in your chest or lower back. ? Fever, flushing, and chills. ? Fast pulse. ? Rash. ? Watery poop (diarrhea). ? Throwing up (vomiting). ? Dark pee. ? Serious headache. ? Dizziness. ? Stiff neck. ? Yellow color in your face or the white parts of your eyes (jaundice). Summary  After a blood transfusion, return to your normal activities as told by your doctor.  Every day, check for signs of infection where the IV tube was put into your vein.  Some  signs of infection are warm skin, more redness and pain, more fluid or blood, and pus or a bad smell where the needle went in.  Contact your doctor if you feel weak or have any unusual symptoms. This information is not intended to replace advice given to you by your health care provider. Make sure you discuss any questions you have with your health care provider. Document Released: 10/25/2014 Document Revised: 05/28/2016 Document Reviewed: 05/28/2016 Elsevier Interactive Patient Education  2019 [employer]  

## 2018-10-08 LAB — FOLATE RBC
Folate, Hemolysate: 347.1 ng/mL
Folate, RBC: 1383 ng/mL (ref >498)
HEMATOCRIT: 25.1 % — AB (ref 34.0–46.6)

## 2018-10-08 LAB — TYPE AND SCREEN
ABO/RH(D): A POS
ANTIBODY SCREEN: NEGATIVE
UNIT DIVISION: 0

## 2018-10-08 LAB — HEMATOLOGY COMMENTS:

## 2018-10-08 LAB — BPAM RBC
Blood Product Expiration Date: 202001142359
ISSUE DATE / TIME: 201912210908
Unit Type and Rh: 6200

## 2018-10-12 ENCOUNTER — Telehealth: Payer: Self-pay

## 2018-10-12 NOTE — Telephone Encounter (Signed)
Left a detailed msg concerning her upcoming appointment. Will mail a letter with a calender enclosed. Per 12/23 los

## 2018-11-01 ENCOUNTER — Telehealth: Payer: Self-pay | Admitting: *Deleted

## 2018-11-01 NOTE — Telephone Encounter (Signed)
Called to clarify message received from scheduling regarding patient wanting to cancel appts for tomorrow.  Patient was trying to make sure she is seen prior to leaving 11/28/2018 for cruise and considered moving tomorrow's appts closer to that date. Advised her that on 10/06/18, Dr. Irene Limbo wanted her to be seen in 4 weeks - which is tomorrow. Encouraged patient to come tomorrow for Lab/MD appt and then ask MD if she could be seen prior to leaving on cruise. Patient agreed with this plan and will come tomorrow to have labs drawn and see MD.

## 2018-11-01 NOTE — Progress Notes (Signed)
HEMATOLOGY/ONCOLOGY CONSULTATION NOTE  Date of Service: 11/02/2018  Patient Care Team: Chesley Noon, MD as PCP - General (Family Medicine)  CHIEF COMPLAINTS/PURPOSE OF CONSULTATION:  mx of MDS/MPN  HISTORY OF PRESENTING ILLNESS:   Alexandra Pierce is a wonderful 73 y.o. female who has been referred to [place] by Dr. Eliseo Squires for evaluation and management of symptomatic anemia.   Patient has history of hypertension, dyslipidemia, atrial fibrillation on anticoagulation with previous history of CVA x2, obesity who presented with generalized weakness.  Prior to admission she had an episode of blurring of vision especially in the right eye associated with a spell of confusion and possible loss of consciousness for a few seconds associated with disorientation.  She notes that she has also been feeling progressively fatigued for a few weeks and has had difficulty with dyspnea on exertion with short distances. Her daughters insisted she get evaluated and so she came to the emergency room for further evaluation. She does follow-up with cardiology and is being evaluated by pulmonary at the end of the month as well.  Patient previously has been on Coumadin from 2000 but was switched to Xarelto several years ago.  She notes no overt evidence of black stools or bleeding in the stools, no hematuria no nosebleeds no gum bleeds. Notes that she has been generally eating okay. Does endorse new night sweats over the last month or 2. No abdominal pain or significant change in bowel habits.  In the emergency room the patient had labs which showed hemoglobin of 6.6.5 with an MCV of 96.5.  Normal WBC count of 7.2k with elevated platelets of 685k which on repeat were down to 573k. Reticulocyte counts were relatively suppressed. LDH was elevated to 517 but haptoglobin was within normal limits at 45. Coombs negative Myeloma panel showed no M spike Ferritin level was elevated to 845 suggestive of acute phase  reaction/inflammation. B12 and folate within normal limits.  Patient received 2 units of PRBCs with an appropriate bump in her hemoglobin level and felt much better. CT chest abdomen pelvis was done which showed mild splenomegaly without a focal splenic lesion.  Multiple hepatic cysts.  Abnormal hypoenhancement in the right kidney lower pole of undetermined etiology -broad differential based on radiology input. right perirenal stranding is associated with a small amount of fluid in the adjacent right paracolic gutter, and a small fluid collection along the inferior margin of the liver which could be an exophytic cyst, small complex fluid collection, or conceivably a tumor nodule.  No other significant other lymphadenopathy noted.  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 The patient's last visit with [place] was on 10/06/18. The pt reports that she is doing well overall.   The pt reports that she has been taking more naps in the past week. She denies light headedness or dizziness. The pt denies any concerns for bleeding and denies fevers, chills, and night sweats.  The pt will be going on a cruise next month, leaving on 2/10.   Lab results today (11/02/18) of CBC w/diff, Reticulocytes, and CMP is as follows: all values are WNL except for RBC at 2.34, HGB at 8.2, HCT at 24.6, MCV at 105.1, MCH at 35.0, RDW at 18.8, PLT at 410k, nRBC at 0.5%,  Retic ct pct at 11.0%, Retic ct abs at 258.1k, Immature retic fract at 30.1%, Glucose at 101, Creatinine at 1.04, Calcium at 8.7, Total Bilirubin at 2.9, GFR at 53.  On review of systems, pt reports feeling a little more tired, eating well, urinating well, and denies light headedness, dizziness, blood in the stools,  blood in the urine, bleeding concerns, nose bleeds, gum bleeds, fevers, chills, night sweats, fatigue, abdominal pains, leg swelling, and any other symptoms.   MEDICAL HISTORY:  Past Medical History:  Diagnosis Date  . Chronic lower back pain   . Hypercholesteremia   . Hypertension    mild  . Obesity   . Paroxysmal atrial fibrillation (HCC)    managed with anticoagulation and rate control.   . Stroke [facility]) 2001 & 2015   "right side gets weak sometimes if I'm only tired" (03/16/2016)    SURGICAL HISTORY: Past Surgical History:  Procedure Laterality Date  . CARDIAC CATHETERIZATION  03/31/2000   EF 49%/LAD lg vessel which coursed to the apex & gave rise to 2 diagonal branches/ LAD noted to have 30% ostial lesion & tapers to a sm vessel toward the apex but no high grade lesion/1st diagonal med sized vessel & 2nd diagonal sm vessel with no significant disease/ lt ventriculogram reveals mild global hypokinesis w/ an estimated EF of 45-50%  . CARDIAC CATHETERIZATION N/A 03/17/2016   Procedure: Left Heart Cath and Coronary Angiography;  Surgeon: Wellington Hampshire, MD;  Location: [facility];  Service: Cardiovascular;  Laterality: N/A;  . LEFT HEART CATH AND CORONARY ANGIOGRAPHY N/A 03/31/2018   Procedure: LEFT HEART CATH AND CORONARY ANGIOGRAPHY;  Surgeon: Martinique, Peter M, MD;  Location: [facility];  Service: Cardiovascular;  Laterality: N/A;    SOCIAL HISTORY: Social History   Socioeconomic History  . Marital status: Married    Spouse name: Not on file  . Number of children: 2  . Years of education: Not on file  . Highest education level: Not on file  Occupational History  . Occupation: Herbalist  Social Needs  . Financial resource strain: Not on file  . Food insecurity:    Worry: Not on file    Inability: Not on file  . Transportation needs:    Medical: Not on file    Non-medical: Not on file  Tobacco Use  . Smoking status: Former Smoker    Packs/day: 0.12      Years: 15.00    Pack years: 1.80    Types: Cigarettes    Last attempt to quit: 10/18/1978    Years since quitting: 40.0  . Smokeless tobacco: Never Used  Substance and Sexual Activity  . Alcohol use: Yes    Alcohol/week: 14.0 standard drinks    Types: 14 Glasses of wine per week  . Drug use: No  . Sexual activity: Yes  Lifestyle  . Physical activity:    Days per week: Not on file    Minutes per session: Not on file  . Stress: Not on file  Relationships  . Social connections:    Talks on phone: Not on file    Gets together: Not on file    Attends religious service: Not on file    Active member of club or organization: Not on file    Attends meetings of clubs or organizations: Not on file    Relationship status: Not on file  . Intimate partner violence:    Fear of current or ex partner: Not  on file    Emotionally abused: Not on file    Physically abused: Not on file    Forced sexual activity: Not on file  Other Topics Concern  . Not on file  Social History Narrative  . Not on file    FAMILY HISTORY: Family History  Problem Relation Age of Onset  . Heart disease Father   . Asthma Father   . Diabetes Father   . Heart disease Mother   . Heart disease Brother   . Hyperlipidemia Sister   . Hyperlipidemia Brother   . Hyperlipidemia Brother   . Cancer Neg Hx     ALLERGIES:  is allergic to sulfonamide derivatives; lipitor [atorvastatin]; pravachol [pravastatin sodium]; and statins.  MEDICATIONS:  Current Outpatient Medications  Medication Sig Dispense Refill  . acetaminophen (TYLENOL) 500 MG tablet Take 1,000 mg by mouth as needed for mild pain or headache.     . cholecalciferol (VITAMIN D) 1000 UNITS tablet Take 1,000 Units by mouth daily.    . fenofibrate 160 MG tablet Take 1 tablet (160 mg total) by mouth daily. 90 tablet 3  . ipratropium (ATROVENT) 0.06 % nasal spray Place 2 sprays into both nostrils every 4 (four) hours as needed for rhinitis. (Patient not  taking: Reported on 07/25/2018) 15 mL 5  . metoprolol tartrate (LOPRESSOR) 25 MG tablet Take 0.5 tablets (12.5 mg total) by mouth 2 (two) times daily. 180 tablet 3  . rivaroxaban (XARELTO) 20 MG TABS tablet Take 1 tablet (20 mg total) by mouth daily. 30 tablet 11   No current facility-administered medications for this visit.     REVIEW OF SYSTEMS:    A 10+ POINT REVIEW OF SYSTEMS WAS OBTAINED including neurology, dermatology, psychiatry, cardiac, respiratory, lymph, extremities, GI, GU, Musculoskeletal, constitutional, breasts, reproductive, HEENT.  All pertinent positives are noted in the HPI.  All others are negative.   PHYSICAL EXAMINATION: ECOG PERFORMANCE STATUS: 1 - Symptomatic but completely ambulatory  Vitals:   11/02/18 1134  BP: 110/78  Pulse: 77  Resp: 18  Temp: 98.1 F (36.7 C)  SpO2: 98%   Filed Weights   11/02/18 1134  Weight: 176 lb (79.8 kg)   .Body mass index is 29.29 kg/m.  GENERAL:alert, in no acute distress and comfortable SKIN: no acute rashes, no significant lesions EYES: conjunctiva are pink and non-injected, sclera anicteric OROPHARYNX: MMM, no exudates, no oropharyngeal erythema or ulceration NECK: supple, no JVD LYMPH:  no palpable lymphadenopathy in the cervical, axillary or inguinal regions LUNGS: clear to auscultation b/l with normal respiratory effort HEART: Irregular S1S2 ABDOMEN:  normoactive bowel sounds , non tender, not distended. No palpable hepatosplenomegaly.  Extremity: trace pedal edema PSYCH: alert & oriented x 3 with fluent speech NEURO: no focal motor/sensory deficits    LABORATORY DATA:  I have reviewed the data as listed  . CBC Latest Ref Rng & Units 11/02/2018 10/06/2018 10/06/2018  WBC 4.0 - 10.5 K/uL 4.3 4.4 -  Hemoglobin 12.0 - 15.0 g/dL 8.2(L) 8.6(L) -  Hematocrit 36.0 - 46.0 % 24.6(L) 26.4(L) 25.1(L)  Platelets 150 - 400 K/uL 410(H) 403(H) -    . CMP Latest Ref Rng & Units 11/02/2018 09/05/2018 07/18/2018  Glucose  70 - 99 mg/dL 101(H) 92 102(H)  BUN 8 - 23 mg/dL 18 17 19   Creatinine 0.44 - 1.00 mg/dL 1.04(H) 0.98 1.12(H)  Sodium 135 - 145 mmol/L 142 143 142  Potassium 3.5 - 5.1 mmol/L 4.1 3.9 4.1  Chloride 98 - 111 mmol/L 107 110  107  CO2 22 - 32 mmol/L 26 25 28   Calcium 8.9 - 10.3 mg/dL 8.7(L) 8.9 9.1  Total Protein 6.5 - 8.1 g/dL 7.3 6.9 7.4  Total Bilirubin 0.3 - 1.2 mg/dL 2.9(H) 2.3(H) 2.3(H)  Alkaline Phos 38 - 126 U/L 42 39 35(L)  AST 15 - 41 U/L 26 35 25  ALT 0 - 44 U/L 15 19 19    05/31/18 Molecular Pathology:   05/24/18 Cytogenetics:     05/24/18 BM Bx:   05/24/18 BM Flow cytometry:     RADIOGRAPHIC STUDIES: I have personally reviewed the radiological images as listed and agreed with the findings in the report. No results found.  ASSESSMENT & PLAN:   73 y.o. female with  1. 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 No overt evidence of bleeding.  In the emergency room the patient had labs which showed hemoglobin of 6.6.5 with an MCV of 96.5.  Normal WBC count of 7.2k with elevated platelets of 685k which on repeat were down to 573k. Reticulocyte counts were relatively suppressed. LDH was elevated to 517 but haptoglobin was within normal limits at 45. Coombs negative Myeloma panel showed no M spike Ferritin level was elevated to 845 suggestive of acute phase reaction/inflammation. B12 and folate within normal limits.  Patient received 2 units of PRBCs with an appropriate bump in her hemoglobin level and felt much better.  05/24/18 BM Bx results which revealed erythroid and megakaryocytic proliferation and ring sideroblasts concerning for myeloproliferative/ myelodysplastic neoplasm   No excess blasts; Acute leukemia and lymphoma are not indicated   2. Rt renal radiographic abnormality as noted in CT with some fluid collection. 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 No fevers chills to  suggest acute pyelonephritis or renal abscess. Urine analysis showed trace leuk esterase and 6-10 WBCs no significant hematuria. Broad differential diagnosis based on CT findings as per radiologist. Procalcitonin levels unimpressive  05/23/18 CT Chest Abdomen Pelvis was done which showed mild splenomegaly without a focal splenic lesion. Multiple hepatic cysts.  Abnormal hypoenhancement in the right kidney lower pole of undetermined etiology -broad differential based on radiology input. right perirenal stranding is associated with a small amount of fluid in the adjacent right paracolic gutter, and a small fluid collection along the inferior margin of the liver which could be an exophytic cyst, small complex fluid collection, or conceivably a tumor nodule. No other significant other lymphadenopathy noted.   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 No indication for hydroxyurea at this time.  -Increased reticulocytosis noted could translate into improving blood counts and could be from increased nutritional support vs increased hemolysis. Will continue to watch this.  -No indication for a blood transfusion today  -Begin a Vitamin B complex every day due -Will check Erythropoietin level at next visit for consideration of Aranesp injections -Pt is taking Xarelto for atrial fibrillation which is protective for her JAK2 mutation related thrombocytosis as well. -Will continue to support patient with transfusions as needed. Blood warmer to be used considering cold antibodies -Will see the pt back on 2018-12-05   Labs and PRBC transfusion x 2units and MD  visit on  11/20/2018   All of the patients questions were answered with apparent satisfaction. The patient knows to call the clinic with any problems, questions or concerns.  The total time spent in the appt was 25 minutes and more than 50% was on counseling and direct patient cares.    Sullivan Lone MD MS AAHIVMS [facility] Hematology/Oncology Physician [facility]  (Office):       [phone] (Work cell):  [phone] (Fax):           [phone]  11/02/2018 12:50 PM  I, Baldwin Jamaica, am acting as a scribe for Dr. Sullivan Lone.   .I have reviewed the above documentation for accuracy and completeness, and I agree with the above. Brunetta Genera MD

## 2018-11-02 ENCOUNTER — Telehealth: Payer: Self-pay | Admitting: Hematology

## 2018-11-02 ENCOUNTER — Inpatient Hospital Stay (HOSPITAL_BASED_OUTPATIENT_CLINIC_OR_DEPARTMENT_OTHER): Payer: Medicare Other | Admitting: Hematology

## 2018-11-02 ENCOUNTER — Inpatient Hospital Stay: Payer: Medicare Other | Attending: Hematology

## 2018-11-02 VITALS — BP 110/78 | HR 77 | Temp 98.1°F | Resp 18 | Ht 65.0 in | Wt 176.0 lb

## 2018-11-02 DIAGNOSIS — D469 Myelodysplastic syndrome, unspecified: Secondary | ICD-10-CM | POA: Insufficient documentation

## 2018-11-02 DIAGNOSIS — I10 Essential (primary) hypertension: Secondary | ICD-10-CM

## 2018-11-02 DIAGNOSIS — Z79899 Other long term (current) drug therapy: Secondary | ICD-10-CM | POA: Insufficient documentation

## 2018-11-02 DIAGNOSIS — Z7901 Long term (current) use of anticoagulants: Secondary | ICD-10-CM | POA: Diagnosis not present

## 2018-11-02 DIAGNOSIS — Z87891 Personal history of nicotine dependence: Secondary | ICD-10-CM

## 2018-11-02 DIAGNOSIS — D461 Refractory anemia with ring sideroblasts: Secondary | ICD-10-CM

## 2018-11-02 DIAGNOSIS — E785 Hyperlipidemia, unspecified: Secondary | ICD-10-CM

## 2018-11-02 DIAGNOSIS — I4891 Unspecified atrial fibrillation: Secondary | ICD-10-CM | POA: Diagnosis not present

## 2018-11-02 DIAGNOSIS — D473 Essential (hemorrhagic) thrombocythemia: Secondary | ICD-10-CM

## 2018-11-02 DIAGNOSIS — Z8673 Personal history of transient ischemic attack (TIA), and cerebral infarction without residual deficits: Secondary | ICD-10-CM

## 2018-11-02 DIAGNOSIS — D75839 Thrombocytosis, unspecified: Secondary | ICD-10-CM

## 2018-11-02 DIAGNOSIS — D649 Anemia, unspecified: Secondary | ICD-10-CM

## 2018-11-02 LAB — CBC WITH DIFFERENTIAL/PLATELET
ABS IMMATURE GRANULOCYTES: 0.03 10*3/uL (ref 0.00–0.07)
Basophils Absolute: 0.1 10*3/uL (ref 0.0–0.1)
Basophils Relative: 1 %
Eosinophils Absolute: 0.1 10*3/uL (ref 0.0–0.5)
Eosinophils Relative: 2 %
HCT: 24.6 % — ABNORMAL LOW (ref 36.0–46.0)
Hemoglobin: 8.2 g/dL — ABNORMAL LOW (ref 12.0–15.0)
Immature Granulocytes: 1 %
Lymphocytes Relative: 28 %
Lymphs Abs: 1.2 10*3/uL (ref 0.7–4.0)
MCH: 35 pg — ABNORMAL HIGH (ref 26.0–34.0)
MCHC: 33.3 g/dL (ref 30.0–36.0)
MCV: 105.1 fL — ABNORMAL HIGH (ref 80.0–100.0)
Monocytes Absolute: 0.2 10*3/uL (ref 0.1–1.0)
Monocytes Relative: 4 %
NEUTROS ABS: 2.8 10*3/uL (ref 1.7–7.7)
Neutrophils Relative %: 64 %
Platelets: 410 10*3/uL — ABNORMAL HIGH (ref 150–400)
RBC: 2.34 MIL/uL — ABNORMAL LOW (ref 3.87–5.11)
RDW: 18.8 % — AB (ref 11.5–15.5)
WBC: 4.3 10*3/uL (ref 4.0–10.5)
nRBC: 0.5 % — ABNORMAL HIGH (ref 0.0–0.2)

## 2018-11-02 LAB — CMP (CANCER CENTER ONLY)
ALK PHOS: 42 U/L (ref 38–126)
ALT: 15 U/L (ref 0–44)
AST: 26 U/L (ref 15–41)
Albumin: 4.2 g/dL (ref 3.5–5.0)
Anion gap: 9 (ref 5–15)
BUN: 18 mg/dL (ref 8–23)
CALCIUM: 8.7 mg/dL — AB (ref 8.9–10.3)
CO2: 26 mmol/L (ref 22–32)
CREATININE: 1.04 mg/dL — AB (ref 0.44–1.00)
Chloride: 107 mmol/L (ref 98–111)
GFR, Est AFR Am: >60 mL/min (ref >60)
GFR, Estimated: 53 mL/min — ABNORMAL LOW (ref >60)
Glucose, Bld: 101 mg/dL — ABNORMAL HIGH (ref 70–99)
Potassium: 4.1 mmol/L (ref 3.5–5.1)
Sodium: 142 mmol/L (ref 135–145)
Total Bilirubin: 2.9 mg/dL — ABNORMAL HIGH (ref 0.3–1.2)
Total Protein: 7.3 g/dL (ref 6.5–8.1)

## 2018-11-02 LAB — RETICULOCYTES
Immature Retic Fract: 30.1 % — ABNORMAL HIGH (ref 2.3–15.9)
RBC.: 2.34 MIL/uL — ABNORMAL LOW (ref 3.87–5.11)
Retic Count, Absolute: 258.1 10*3/uL — ABNORMAL HIGH (ref 19.0–186.0)
Retic Ct Pct: 11 % — ABNORMAL HIGH (ref 0.4–3.1)

## 2018-11-02 LAB — SAMPLE TO BLOOD BANK

## 2018-11-02 NOTE — Telephone Encounter (Signed)
Printed calendar and avs. °

## 2018-11-03 ENCOUNTER — Inpatient Hospital Stay: Payer: Medicare Other

## 2018-11-21 NOTE — Progress Notes (Signed)
HEMATOLOGY/ONCOLOGY CLINIC NOTE  Date of Service: 11/22/2018  Patient Care Team: Chesley Noon, MD as PCP - General (Family Medicine)  CHIEF COMPLAINTS/PURPOSE OF CONSULTATION:  mx of MDS/MPN  HISTORY OF PRESENTING ILLNESS:   Alexandra Pierce is a wonderful 73 y.o. female who has been referred to [place] by Dr. Eliseo Squires for evaluation and management of symptomatic anemia.   Patient has history of hypertension, dyslipidemia, atrial fibrillation on anticoagulation with previous history of CVA x2, obesity who presented with generalized weakness.  Prior to admission she had an episode of blurring of vision especially in the right eye associated with a spell of confusion and possible loss of consciousness for a few seconds associated with disorientation.  She notes that she has also been feeling progressively fatigued for a few weeks and has had difficulty with dyspnea on exertion with short distances. Her daughters insisted she get evaluated and so she came to the emergency room for further evaluation. She does follow-up with cardiology and is being evaluated by pulmonary at the end of the month as well.  Patient previously has been on Coumadin from 2000 but was switched to Xarelto several years ago.  She notes no overt evidence of black stools or bleeding in the stools, no hematuria no nosebleeds no gum bleeds. Notes that she has been generally eating okay. Does endorse new night sweats over the last month or 2. No abdominal pain or significant change in bowel habits.  In the emergency room the patient had labs which showed hemoglobin of 6.6.5 with an MCV of 96.5.  Normal WBC count of 7.2k with elevated platelets of 685k which on repeat were down to 573k. Reticulocyte counts were relatively suppressed. LDH was elevated to 517 but haptoglobin was within normal limits at 45. Coombs negative Myeloma panel showed no M spike Ferritin level was elevated to 845 suggestive of acute phase  reaction/inflammation. B12 and folate within normal limits.  Patient received 2 units of PRBCs with an appropriate bump in her hemoglobin level and felt much better. CT chest abdomen pelvis was done which showed mild splenomegaly without a focal splenic lesion.  Multiple hepatic cysts.  Abnormal hypoenhancement in the right kidney lower pole of undetermined etiology -broad differential based on radiology input. right perirenal stranding is associated with a small amount of fluid in the adjacent right paracolic gutter, and a small fluid collection along the inferior margin of the liver which could be an exophytic cyst, small complex fluid collection, or conceivably a tumor nodule.  No other significant other lymphadenopathy noted.  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 The patient's last visit with [place] was on 11/02/18. The pt reports that she is doing well overall.   The pt reports that she is feeling tired today and more fatigued. She notes that she has continued eating well and endorses stable weight. She also began a Vitamin B complex in the interim  Lab results today (11/22/18) of CBC w/diff and CMP is as follows: all values are WNL except for RBC at 2.15, HGB at 7.8, HCT at 23.7, MCV at 110.2, MCH at 36.3, RDW at 19.0, nRBC at 0.9%, Glucose at 107, Creatinine at 1.07, Calcium at 8.8, Total Bilirubin at 2.4, GFR at 51.  11/22/18 LDH,  Ferritin, Iron & TIBC, Haptoglobin, Vitamin B12, and Folate are all pending.  On review of systems, pt reports feeling tired, eating well, and denies leg swelling, flank pain, concerns for infections, and any other symptoms.   MEDICAL HISTORY:  Past Medical History:  Diagnosis Date  . Chronic lower back pain   .  Hypercholesteremia   . Hypertension    mild  . Obesity   . Paroxysmal atrial fibrillation (HCC)    managed with anticoagulation and rate control.   . Stroke [facility]) 2001 & 2015   "right side gets weak sometimes if I'm only tired" (03/16/2016)    SURGICAL HISTORY: Past Surgical History:  Procedure Laterality Date  . CARDIAC CATHETERIZATION  03/31/2000   EF 49%/LAD lg vessel which coursed to the apex & gave rise to 2 diagonal branches/ LAD noted to have 30% ostial lesion & tapers to a sm vessel toward the apex but no high grade lesion/1st diagonal med sized vessel & 2nd diagonal sm vessel with no significant disease/ lt ventriculogram reveals mild global hypokinesis w/ an estimated EF of 45-50%  . CARDIAC CATHETERIZATION N/A 03/17/2016   Procedure: Left Heart Cath and Coronary Angiography;  Surgeon: Wellington Hampshire, MD;  Location: [place] [facility];  Service: Cardiovascular;  Laterality: N/A;  . LEFT HEART CATH AND CORONARY ANGIOGRAPHY N/A 03/31/2018   Procedure: LEFT HEART CATH AND CORONARY ANGIOGRAPHY;  Surgeon: Martinique, Peter M, MD;  Location: [place] [facility];  Service: Cardiovascular;  Laterality: N/A;    SOCIAL HISTORY: Social History   Socioeconomic History  . Marital status: Married    Spouse name: Not on file  . Number of children: 2  . Years of education: Not on file  . Highest education level: Not on file  Occupational History  . Occupation: Herbalist  Social Needs  . Financial resource strain: Not on file  . Food insecurity:    Worry: Not on file    Inability: Not on file  . Transportation needs:    Medical: Not on file    Non-medical: Not on file  Tobacco Use  . Smoking status: Former Smoker    Packs/day: 0.12    Years: 15.00    Pack years: 1.80    Types: Cigarettes    Last attempt to quit: 10/18/1978    Years since quitting: 40.1  . Smokeless tobacco: Never Used  Substance and Sexual Activity  . Alcohol use: Yes    Alcohol/week: 14.0 standard drinks     Types: 14 Glasses of wine per week  . Drug use: No  . Sexual activity: Yes  Lifestyle  . Physical activity:    Days per week: Not on file    Minutes per session: Not on file  . Stress: Not on file  Relationships  . Social connections:    Talks on phone: Not on file    Gets together: Not on file    Attends religious service: Not on file    Active member of club or organization: Not on file    Attends meetings of clubs or organizations: Not on file    Relationship status: Not on file  . Intimate partner violence:    Fear of current or ex partner: Not on file    Emotionally abused: Not on file    Physically abused: Not on file    Forced sexual activity: Not on file  Other Topics Concern  . Not on file  Social History Narrative  . Not on  file    FAMILY HISTORY: Family History  Problem Relation Age of Onset  . Heart disease Father   . Asthma Father   . Diabetes Father   . Heart disease Mother   . Heart disease Brother   . Hyperlipidemia Sister   . Hyperlipidemia Brother   . Hyperlipidemia Brother   . Cancer Neg Hx     ALLERGIES:  is allergic to sulfonamide derivatives; lipitor [atorvastatin]; pravachol [pravastatin sodium]; and statins.  MEDICATIONS:  Current Outpatient Medications  Medication Sig Dispense Refill  . acetaminophen (TYLENOL) 500 MG tablet Take 1,000 mg by mouth as needed for mild pain or headache.     . cholecalciferol (VITAMIN D) 1000 UNITS tablet Take 1,000 Units by mouth daily.    . fenofibrate 160 MG tablet Take 1 tablet (160 mg total) by mouth daily. 90 tablet 3  . ipratropium (ATROVENT) 0.06 % nasal spray Place 2 sprays into both nostrils every 4 (four) hours as needed for rhinitis. (Patient not taking: Reported on 07/25/2018) 15 mL 5  . metoprolol tartrate (LOPRESSOR) 25 MG tablet Take 0.5 tablets (12.5 mg total) by mouth 2 (two) times daily. 180 tablet 3  . rivaroxaban (XARELTO) 20 MG TABS tablet Take 1 tablet (20 mg total) by mouth daily. 30  tablet 11   No current facility-administered medications for this visit.     REVIEW OF SYSTEMS:    A 10+ POINT REVIEW OF SYSTEMS WAS OBTAINED including neurology, dermatology, psychiatry, cardiac, respiratory, lymph, extremities, GI, GU, Musculoskeletal, constitutional, breasts, reproductive, HEENT.  All pertinent positives are noted in the HPI.  All others are negative.   PHYSICAL EXAMINATION: ECOG PERFORMANCE STATUS: 1 - Symptomatic but completely ambulatory  Vitals:   11/22/18 0927  BP: 111/77  Pulse: 84  Resp: 18  Temp: 98 F (36.7 C)  SpO2: 100%   Filed Weights   11/22/18 0927  Weight: 179 lb 4.8 oz (81.3 kg)   .Body mass index is 29.84 kg/m.  GENERAL:alert, in no acute distress and comfortable SKIN: no acute rashes, no significant lesions EYES: conjunctiva are pink and non-injected, sclera anicteric OROPHARYNX: MMM, no exudates, no oropharyngeal erythema or ulceration NECK: supple, no JVD LYMPH:  no palpable lymphadenopathy in the cervical, axillary or inguinal regions LUNGS: clear to auscultation b/l with normal respiratory effort HEART: Irregular S1S2 ABDOMEN:  normoactive bowel sounds , non tender, not distended. No palpable hepatosplenomegaly.  Extremity: trace pedal edema PSYCH: alert & oriented x 3 with fluent speech NEURO: no focal motor/sensory deficits    LABORATORY DATA:  I have reviewed the data as listed  . CBC Latest Ref Rng & Units 11/22/2018 11/02/2018 10/06/2018  WBC 4.0 - 10.5 K/uL 4.5 4.3 4.4  Hemoglobin 12.0 - 15.0 g/dL 7.8(L) 8.2(L) 8.6(L)  Hematocrit 36.0 - 46.0 % 23.7(L) 24.6(L) 26.4(L)  Platelets 150 - 400 K/uL 392 410(H) 403(H)    . CMP Latest Ref Rng & Units 11/22/2018 11/02/2018 09/05/2018  Glucose 70 - 99 mg/dL 107(H) 101(H) 92  BUN 8 - 23 mg/dL 23 18 17   Creatinine 0.44 - 1.00 mg/dL 1.07(H) 1.04(H) 0.98  Sodium 135 - 145 mmol/L 141 142 143  Potassium 3.5 - 5.1 mmol/L 3.9 4.1 3.9  Chloride 98 - 111 mmol/L 108 107 110  CO2 22 - 32  mmol/L 25 26 25   Calcium 8.9 - 10.3 mg/dL 8.8(L) 8.7(L) 8.9  Total Protein 6.5 - 8.1 g/dL 7.2 7.3 6.9  Total Bilirubin 0.3 - 1.2 mg/dL 2.4(H) 2.9(H) 2.3(H)  Alkaline Phos  38 - 126 U/L 38 42 39  AST 15 - 41 U/L 32 26 35  ALT 0 - 44 U/L 17 15 19    05/31/18 Molecular Pathology:   05/24/18 Cytogenetics:     05/24/18 BM Bx:   05/24/18 BM Flow cytometry:     RADIOGRAPHIC STUDIES: I have personally reviewed the radiological images as listed and agreed with the findings in the report. No results found.  ASSESSMENT & PLAN:   73 y.o. female with  1. 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 No overt evidence of bleeding.  In the emergency room the patient had labs which showed hemoglobin of 6.6.5 with an MCV of 96.5.  Normal WBC count of 7.2k with elevated platelets of 685k which on repeat were down to 573k. Reticulocyte counts were relatively suppressed. LDH was elevated to 517 but haptoglobin was within normal limits at 45. Coombs negative Myeloma panel showed no M spike Ferritin level was elevated to 845 suggestive of acute phase reaction/inflammation. B12 and folate within normal limits.  Patient received 2 units of PRBCs with an appropriate bump in her hemoglobin level and felt much better.  05/24/18 BM Bx results which revealed erythroid and megakaryocytic proliferation and ring sideroblasts concerning for myeloproliferative/ myelodysplastic neoplasm   No excess blasts; Acute leukemia and lymphoma are not indicated   2. Rt renal radiographic abnormality as noted in CT with some fluid collection. 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 No fevers chills to suggest acute pyelonephritis or renal abscess. Urine analysis showed trace leuk esterase and 6-10 WBCs no significant hematuria. Broad differential diagnosis based on CT findings as per radiologist. Procalcitonin levels unimpressive  05/23/18  CT Chest Abdomen Pelvis was done which showed mild splenomegaly without a focal splenic lesion. Multiple hepatic cysts.  Abnormal hypoenhancement in the right kidney lower pole of undetermined etiology -broad differential based on radiology input. right perirenal stranding is associated with a small amount of fluid in the adjacent right paracolic gutter, and a small fluid collection along the inferior margin of the liver which could be an exophytic cyst, small complex fluid collection, or conceivably a tumor nodule. No other significant other lymphadenopathy noted.   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  Blood warmer to be used considering cold antibodies.  -Discussed the indication to consider Erythropoietin injections, and PLT have now normalized. Will check Erythropoietin levels at next visit.  -Despite patient's Jak2 mutation, her thrombocytosis has been improving. No indication for hydroxyurea at this time. -Pt is taking Xarelto for atrial fibrillation which is protective for her JAK2 mutation related thrombocytosis as well. -Continue Vitamin B complex  -Will see the pt back in 6 weeks    RTC with Dr Irene Limbo with labs in 6 weeks PRBC transfusion x 1 appointment in 6 weeks on day of clinic f/u    All of the patients questions were answered with apparent satisfaction. The patient knows to call the clinic with any problems, questions or concerns.  The total time spent in the appt was 20 minutes and more than 50% was on counseling  and direct patient cares.    Sullivan Lone  MD MS AAHIVMS [facility] [place] [facility]ology/Oncology Physician [facility]  (Office):       [phone] (Work cell):  [phone] (Fax):           [phone]  11/22/2018 9:51 AM  I, Baldwin Jamaica, am acting as a scribe for Dr. Sullivan Lone.   .I have reviewed the above documentation for accuracy and completeness, and I agree with the above. Brunetta Genera MD

## 2018-11-22 ENCOUNTER — Inpatient Hospital Stay: Payer: Medicare Other

## 2018-11-22 ENCOUNTER — Other Ambulatory Visit: Payer: Self-pay

## 2018-11-22 ENCOUNTER — Inpatient Hospital Stay: Payer: Medicare Other | Attending: Hematology

## 2018-11-22 ENCOUNTER — Telehealth: Payer: Self-pay

## 2018-11-22 ENCOUNTER — Inpatient Hospital Stay (HOSPITAL_BASED_OUTPATIENT_CLINIC_OR_DEPARTMENT_OTHER): Payer: Medicare Other | Admitting: Hematology

## 2018-11-22 VITALS — BP 111/77 | HR 84 | Temp 98.0°F | Resp 18 | Ht 65.0 in | Wt 179.3 lb

## 2018-11-22 DIAGNOSIS — D461 Refractory anemia with ring sideroblasts: Secondary | ICD-10-CM

## 2018-11-22 DIAGNOSIS — I4891 Unspecified atrial fibrillation: Secondary | ICD-10-CM | POA: Diagnosis not present

## 2018-11-22 DIAGNOSIS — D649 Anemia, unspecified: Secondary | ICD-10-CM

## 2018-11-22 DIAGNOSIS — N280 Ischemia and infarction of kidney: Secondary | ICD-10-CM

## 2018-11-22 DIAGNOSIS — Z7901 Long term (current) use of anticoagulants: Secondary | ICD-10-CM | POA: Insufficient documentation

## 2018-11-22 LAB — CMP (CANCER CENTER ONLY)
ALK PHOS: 38 U/L (ref 38–126)
ALT: 17 U/L (ref 0–44)
AST: 32 U/L (ref 15–41)
Albumin: 4.3 g/dL (ref 3.5–5.0)
Anion gap: 8 (ref 5–15)
BILIRUBIN TOTAL: 2.4 mg/dL — AB (ref 0.3–1.2)
BUN: 23 mg/dL (ref 8–23)
CO2: 25 mmol/L (ref 22–32)
Calcium: 8.8 mg/dL — ABNORMAL LOW (ref 8.9–10.3)
Chloride: 108 mmol/L (ref 98–111)
Creatinine: 1.07 mg/dL — ABNORMAL HIGH (ref 0.44–1.00)
GFR, Est AFR Am: 60 mL/min — ABNORMAL LOW (ref >60)
GFR, Estimated: 51 mL/min — ABNORMAL LOW (ref >60)
Glucose, Bld: 107 mg/dL — ABNORMAL HIGH (ref 70–99)
Potassium: 3.9 mmol/L (ref 3.5–5.1)
Sodium: 141 mmol/L (ref 135–145)
TOTAL PROTEIN: 7.2 g/dL (ref 6.5–8.1)

## 2018-11-22 LAB — CBC WITH DIFFERENTIAL/PLATELET
ABS IMMATURE GRANULOCYTES: 0.04 10*3/uL (ref 0.00–0.07)
Basophils Absolute: 0 10*3/uL (ref 0.0–0.1)
Basophils Relative: 1 %
Eosinophils Absolute: 0.1 10*3/uL (ref 0.0–0.5)
Eosinophils Relative: 2 %
HCT: 23.7 % — ABNORMAL LOW (ref 36.0–46.0)
HEMOGLOBIN: 7.8 g/dL — AB (ref 12.0–15.0)
Immature Granulocytes: 1 %
Lymphocytes Relative: 33 %
Lymphs Abs: 1.5 10*3/uL (ref 0.7–4.0)
MCH: 36.3 pg — ABNORMAL HIGH (ref 26.0–34.0)
MCHC: 32.9 g/dL (ref 30.0–36.0)
MCV: 110.2 fL — ABNORMAL HIGH (ref 80.0–100.0)
Monocytes Absolute: 0.2 10*3/uL (ref 0.1–1.0)
Monocytes Relative: 5 %
NRBC: 0.9 % — AB (ref 0.0–0.2)
Neutro Abs: 2.6 10*3/uL (ref 1.7–7.7)
Neutrophils Relative %: 58 %
Platelets: 392 10*3/uL (ref 150–400)
RBC: 2.15 MIL/uL — ABNORMAL LOW (ref 3.87–5.11)
RDW: 19 % — ABNORMAL HIGH (ref 11.5–15.5)
WBC: 4.5 10*3/uL (ref 4.0–10.5)

## 2018-11-22 LAB — DIRECT ANTIGLOBULIN TEST (NOT AT ARMC)
DAT, IgG: POSITIVE
DAT, complement: POSITIVE

## 2018-11-22 LAB — IRON AND TIBC
Iron: 209 ug/dL — ABNORMAL HIGH (ref 41–142)
Saturation Ratios: 55 % (ref 21–57)
TIBC: 381 ug/dL (ref 236–444)
UIBC: 172 ug/dL (ref 120–384)

## 2018-11-22 LAB — FERRITIN: Ferritin: 465 ng/mL — ABNORMAL HIGH (ref 11–307)

## 2018-11-22 LAB — LACTATE DEHYDROGENASE: LDH: 511 U/L — ABNORMAL HIGH (ref 98–192)

## 2018-11-22 LAB — VITAMIN B12: Vitamin B-12: 270 pg/mL (ref 180–914)

## 2018-11-22 LAB — SAMPLE TO BLOOD BANK

## 2018-11-22 LAB — PREPARE RBC (CROSSMATCH)

## 2018-11-22 MED ORDER — FUROSEMIDE 10 MG/ML IJ SOLN
20.0000 mg | Freq: Once | INTRAMUSCULAR | Status: AC
  Administered 2018-11-22: 20 mg via INTRAVENOUS

## 2018-11-22 MED ORDER — DIPHENHYDRAMINE HCL 25 MG PO CAPS
ORAL_CAPSULE | ORAL | Status: AC
  Filled 2018-11-22: qty 1

## 2018-11-22 MED ORDER — SODIUM CHLORIDE 0.9% IV SOLUTION
250.0000 mL | Freq: Once | INTRAVENOUS | Status: AC
  Administered 2018-11-22: 250 mL via INTRAVENOUS
  Filled 2018-11-22: qty 250

## 2018-11-22 MED ORDER — DIPHENHYDRAMINE HCL 25 MG PO CAPS
25.0000 mg | ORAL_CAPSULE | Freq: Once | ORAL | Status: AC
  Administered 2018-11-22: 25 mg via ORAL

## 2018-11-22 MED ORDER — FUROSEMIDE 10 MG/ML IJ SOLN
INTRAMUSCULAR | Status: AC
  Filled 2018-11-22: qty 2

## 2018-11-22 MED ORDER — ACETAMINOPHEN 325 MG PO TABS
ORAL_TABLET | ORAL | Status: AC
  Filled 2018-11-22: qty 2

## 2018-11-22 MED ORDER — DIPHENHYDRAMINE HCL 25 MG PO CAPS
ORAL_CAPSULE | ORAL | Status: AC
  Filled 2018-11-22: qty 2

## 2018-11-22 MED ORDER — ACETAMINOPHEN 325 MG PO TABS
650.0000 mg | ORAL_TABLET | Freq: Once | ORAL | Status: AC
  Administered 2018-11-22: 650 mg via ORAL

## 2018-11-22 NOTE — Patient Instructions (Signed)
Blood Transfusion, Adult, Care After This sheet gives you information about how to care for yourself after your procedure. Your doctor may also give you more specific instructions. If you have problems or questions, contact your doctor. Follow these instructions at home:   Take over-the-counter and prescription medicines only as told by your doctor.  Go back to your normal activities as told by your doctor.  Follow instructions from your doctor about how to take care of the area where an IV tube was put into your vein (insertion site). Make sure you: ? Wash your hands with soap and water before you change your bandage (dressing). If there is no soap and water, use hand sanitizer. ? Change your bandage as told by your doctor.  Check your IV insertion site every day for signs of infection. Check for: ? More redness, swelling, or pain. ? More fluid or blood. ? Warmth. ? Pus or a bad smell. Contact a doctor if:  You have more redness, swelling, or pain around the IV insertion site.  You have more fluid or blood coming from the IV insertion site.  Your IV insertion site feels warm to the touch.  You have pus or a bad smell coming from the IV insertion site.  Your pee (urine) turns pink, red, or brown.  You feel weak after doing your normal activities. Get help right away if:  You have signs of a serious allergic or body defense (immune) system reaction, including: ? Itchiness. ? Hives. ? Trouble breathing. ? Anxiety. ? Pain in your chest or lower back. ? Fever, flushing, and chills. ? Fast pulse. ? Rash. ? Watery poop (diarrhea). ? Throwing up (vomiting). ? Dark pee. ? Serious headache. ? Dizziness. ? Stiff neck. ? Yellow color in your face or the white parts of your eyes (jaundice). Summary  After a blood transfusion, return to your normal activities as told by your doctor.  Every day, check for signs of infection where the IV tube was put into your vein.  Some  signs of infection are warm skin, more redness and pain, more fluid or blood, and pus or a bad smell where the needle went in.  Contact your doctor if you feel weak or have any unusual symptoms. This information is not intended to replace advice given to you by your health care provider. Make sure you discuss any questions you have with your health care provider. Document Released: 10/25/2014 Document Revised: 05/28/2016 Document Reviewed: 05/28/2016 Elsevier Interactive Patient Education  2019 [employer]  

## 2018-11-22 NOTE — Telephone Encounter (Signed)
Printed avs and calender of upcoming appointment. Per 2/5 los 

## 2018-11-23 ENCOUNTER — Other Ambulatory Visit: Payer: Self-pay | Admitting: Hematology

## 2018-11-23 LAB — TYPE AND SCREEN
ABO/RH(D): A POS
Antibody Screen: POSITIVE
DAT, IgG: POSITIVE
Donor AG Type: NEGATIVE
Donor AG Type: NEGATIVE
PT AG Type: NEGATIVE
UNIT DIVISION: 0
Unit division: 0

## 2018-11-23 LAB — BPAM RBC
Blood Product Expiration Date: 202002282359
Blood Product Expiration Date: 202003022359
ISSUE DATE / TIME: 202002051335
ISSUE DATE / TIME: 202002051335
Unit Type and Rh: 6200
Unit Type and Rh: 6200

## 2018-11-23 LAB — HAPTOGLOBIN: Haptoglobin: <10 mg/dL — ABNORMAL LOW (ref 42–346)

## 2018-11-23 LAB — ERYTHROPOIETIN: Erythropoietin: 52.4 m[IU]/mL — ABNORMAL HIGH (ref 2.6–18.5)

## 2018-11-23 MED ORDER — B-12 1000 MCG SL SUBL: 1000.0000 ug | SUBLINGUAL_TABLET | Freq: Every day | SUBLINGUAL | 3 refills | Status: DC

## 2018-11-23 MED ORDER — PREDNISONE 20 MG PO TABS: ORAL_TABLET | ORAL | 0 refills | Status: AC

## 2018-11-24 LAB — FOLATE RBC
FOLATE, HEMOLYSATE: 374 ng/mL
Folate, RBC: 1558 ng/mL (ref >498)
Hematocrit: 24 % — ABNORMAL LOW (ref 34.0–46.6)

## 2018-11-24 LAB — HEMATOLOGY COMMENTS:

## 2018-11-28 ENCOUNTER — Telehealth: Payer: Self-pay | Admitting: *Deleted

## 2018-11-28 NOTE — Telephone Encounter (Signed)
Contacted patient per Dr. Grier Mitts directions: Please let patient know B12 levels are low and would recommend taking OTC Liquid or Sublingual B12 1000 micrograms daily. Also her labs suggest she may have some red cell breakage due to antibodies for which a steroid trial is recommended after she returns from her cruise. I will sent Prednisone prescription to her pharmacy which she can start after returning from cruise and will need appointment to see up 2 weeks after starting prednisone with labs (cbc/diff , reticulocyte counts, cmp and LDH). Patient verbalized understanding of all instructions. Patient states she will start Prednisone on 2/23. For 2 week follow up, she will be available for an appt 3/6 or beginning week of 3/16 (out of town week of 3/9). Message will be sent to scheduling to make appt for patient during those times and to contact after 2/23.

## 2018-12-11 ENCOUNTER — Other Ambulatory Visit: Payer: Self-pay | Admitting: Nurse Practitioner

## 2018-12-11 MED ORDER — METOPROLOL TARTRATE 25 MG PO TABS: 12.5000 mg | ORAL_TABLET | Freq: Two times a day (BID) | ORAL | 0 refills | Status: DC

## 2018-12-11 NOTE — Telephone Encounter (Signed)
RX sent to pharmacy for 90 day supply.  

## 2018-12-11 NOTE — Telephone Encounter (Signed)
°*  STAT* If patient is at the pharmacy, call can be transferred to refill team.   1. Which medications need to be refilled? (please list name of each medication and dose if known) metoprolol tartrate (LOPRESSOR) 25 MG tablet  2. Which pharmacy/location (including street and city if local pharmacy) is medication to be sent to? [place], [place] - [address]  3. Do they need a 30 day or 90 day supply? 90 days

## 2018-12-27 ENCOUNTER — Other Ambulatory Visit: Payer: Self-pay | Admitting: *Deleted

## 2018-12-27 ENCOUNTER — Encounter: Payer: Self-pay | Admitting: Nurse Practitioner

## 2018-12-27 ENCOUNTER — Telehealth: Payer: Self-pay | Admitting: *Deleted

## 2018-12-27 ENCOUNTER — Telehealth: Payer: Self-pay | Admitting: Hematology

## 2018-12-27 DIAGNOSIS — D461 Refractory anemia with ring sideroblasts: Secondary | ICD-10-CM

## 2018-12-27 NOTE — Telephone Encounter (Signed)
Returned call to patient re needing an appointment. Per patient she has an appointment already set for 3/20, however she feels like she needs her lab drawn before then. Patient transferred to leave message for desk nurse. This message also routed to GK/desk nurse.

## 2018-12-27 NOTE — Telephone Encounter (Signed)
Patient reports feeling extremely fatigued. States she finished steroids. Has appt on 3/20, but needs to have blood checked before then. Per Dr. Irene Limbo - make appt for labs 3/12 AM and appt for transfusion of 2 units of blood on 3/12 or soonest available. Patient agreeable with plan and verbalized understanding.

## 2018-12-28 ENCOUNTER — Inpatient Hospital Stay: Payer: Medicare Other | Attending: Hematology

## 2018-12-28 ENCOUNTER — Telehealth: Payer: Self-pay | Admitting: *Deleted

## 2018-12-28 ENCOUNTER — Other Ambulatory Visit: Payer: Self-pay

## 2018-12-28 DIAGNOSIS — D461 Refractory anemia with ring sideroblasts: Secondary | ICD-10-CM | POA: Diagnosis not present

## 2018-12-28 DIAGNOSIS — I480 Paroxysmal atrial fibrillation: Secondary | ICD-10-CM | POA: Insufficient documentation

## 2018-12-28 DIAGNOSIS — Z7901 Long term (current) use of anticoagulants: Secondary | ICD-10-CM | POA: Insufficient documentation

## 2018-12-28 LAB — CBC WITH DIFFERENTIAL (CANCER CENTER ONLY)
Abs Immature Granulocytes: 0.1 10*3/uL — ABNORMAL HIGH (ref 0.00–0.07)
Basophils Absolute: 0 10*3/uL (ref 0.0–0.1)
Basophils Relative: 0 %
Eosinophils Absolute: 0.1 10*3/uL (ref 0.0–0.5)
Eosinophils Relative: 1 %
HCT: 21 % — ABNORMAL LOW (ref 36.0–46.0)
Hemoglobin: 6.9 g/dL — CL (ref 12.0–15.0)
Immature Granulocytes: 2 %
LYMPHS PCT: 33 %
Lymphs Abs: 2.2 10*3/uL (ref 0.7–4.0)
MCH: 35.4 pg — ABNORMAL HIGH (ref 26.0–34.0)
MCHC: 32.9 g/dL (ref 30.0–36.0)
MCV: 107.7 fL — ABNORMAL HIGH (ref 80.0–100.0)
Monocytes Absolute: 0.3 10*3/uL (ref 0.1–1.0)
Monocytes Relative: 4 %
NEUTROS PCT: 60 %
Neutro Abs: 4 10*3/uL (ref 1.7–7.7)
Platelet Count: 339 10*3/uL (ref 150–400)
RBC: 1.95 MIL/uL — ABNORMAL LOW (ref 3.87–5.11)
RDW: 21.2 % — AB (ref 11.5–15.5)
WBC Count: 6.6 10*3/uL (ref 4.0–10.5)
nRBC: 1.7 % — ABNORMAL HIGH (ref 0.0–0.2)

## 2018-12-28 LAB — SAMPLE TO BLOOD BANK

## 2018-12-28 LAB — CMP (CANCER CENTER ONLY)
ALT: 14 U/L (ref 0–44)
AST: 21 U/L (ref 15–41)
Albumin: 3.8 g/dL (ref 3.5–5.0)
Alkaline Phosphatase: 40 U/L (ref 38–126)
Anion gap: 10 (ref 5–15)
BUN: 22 mg/dL (ref 8–23)
CHLORIDE: 108 mmol/L (ref 98–111)
CO2: 25 mmol/L (ref 22–32)
Calcium: 8.5 mg/dL — ABNORMAL LOW (ref 8.9–10.3)
Creatinine: 1.24 mg/dL — ABNORMAL HIGH (ref 0.44–1.00)
GFR, Est AFR Am: 50 mL/min — ABNORMAL LOW (ref >60)
GFR, Estimated: 43 mL/min — ABNORMAL LOW (ref >60)
Glucose, Bld: 105 mg/dL — ABNORMAL HIGH (ref 70–99)
POTASSIUM: 4.2 mmol/L (ref 3.5–5.1)
Sodium: 143 mmol/L (ref 135–145)
Total Bilirubin: 3.1 mg/dL — ABNORMAL HIGH (ref 0.3–1.2)
Total Protein: 6.5 g/dL (ref 6.5–8.1)

## 2018-12-28 NOTE — Telephone Encounter (Signed)
Alexandra Pierce report.  Today's Hgb = 6.9 g/dL.  "We have a Type and Hold we'll go ahead and send."

## 2018-12-28 NOTE — Telephone Encounter (Signed)
Patient came for lab tests, hgb low. Earliest infusion time for blood transfusion as outpatient is Saturday 3/14 or patient can go to ED for admission to hospital. Informed patient of hgb level and that Dr. Irene Limbo can provide transfusion as inpatient. Patient chose to until Saturday to have transfusion. Advised that if waiting until Saturday for transfusion, she is to seek immediate medical help/call 911 for CP/SOB/dizziness or increased weakness.  Patient states her hgb has probably been this low for several days - she has been tired for several days and when rest didn't help, she knew her hgb was down again.  Patient chose to until Saturday to have transfusion and verbalized understanding regarding symptoms.

## 2018-12-29 ENCOUNTER — Other Ambulatory Visit: Payer: Self-pay | Admitting: Hematology

## 2018-12-29 DIAGNOSIS — D649 Anemia, unspecified: Secondary | ICD-10-CM

## 2018-12-29 DIAGNOSIS — D461 Refractory anemia with ring sideroblasts: Secondary | ICD-10-CM | POA: Diagnosis not present

## 2018-12-30 ENCOUNTER — Other Ambulatory Visit: Payer: Self-pay

## 2018-12-30 ENCOUNTER — Inpatient Hospital Stay: Payer: Medicare Other

## 2018-12-30 DIAGNOSIS — D649 Anemia, unspecified: Secondary | ICD-10-CM

## 2018-12-30 DIAGNOSIS — D461 Refractory anemia with ring sideroblasts: Secondary | ICD-10-CM | POA: Diagnosis not present

## 2018-12-30 LAB — PREPARE RBC (CROSSMATCH)

## 2018-12-30 MED ORDER — FUROSEMIDE 10 MG/ML IJ SOLN
20.0000 mg | Freq: Once | INTRAMUSCULAR | Status: AC
  Administered 2018-12-30: 20 mg via INTRAVENOUS

## 2018-12-30 MED ORDER — ACETAMINOPHEN 325 MG PO TABS
650.0000 mg | ORAL_TABLET | Freq: Once | ORAL | Status: AC
  Administered 2018-12-30: 650 mg via ORAL

## 2018-12-30 MED ORDER — DIPHENHYDRAMINE HCL 25 MG PO CAPS
25.0000 mg | ORAL_CAPSULE | Freq: Once | ORAL | Status: AC
  Administered 2018-12-30: 25 mg via ORAL

## 2018-12-30 MED ORDER — METHYLPREDNISOLONE SODIUM SUCC 40 MG IJ SOLR
40.0000 mg | Freq: Once | INTRAMUSCULAR | Status: AC
  Administered 2018-12-30: 40 mg via INTRAVENOUS

## 2019-01-01 LAB — TYPE AND SCREEN
ABO/RH(D): A POS
Antibody Screen: POSITIVE
DAT, IgG: POSITIVE
Donor AG Type: NEGATIVE
Donor AG Type: NEGATIVE
Unit division: 0
Unit division: 0

## 2019-01-01 LAB — BPAM RBC
Blood Product Expiration Date: 202004092359
Blood Product Expiration Date: 202004092359
ISSUE DATE / TIME: 202003140958
ISSUE DATE / TIME: 202003141151
Unit Type and Rh: 6200
Unit Type and Rh: 6200

## 2019-01-04 ENCOUNTER — Telehealth: Payer: Self-pay | Admitting: *Deleted

## 2019-01-04 NOTE — Telephone Encounter (Signed)
Contacted patient for next day appt COVID-19 screening call. Patient out of country for 3 days 2/14-2/17 in [place]. Patient is asymptomatic. Advised of visitor restriction rule. Patient verbalized understanding.

## 2019-01-04 NOTE — Progress Notes (Signed)
HEMATOLOGY/ONCOLOGY CLINIC NOTE  Date of Service: 01/05/2019  Patient Care Team: Chesley Noon, MD as PCP - General (Family Medicine)  CHIEF COMPLAINTS/PURPOSE OF CONSULTATION:  mx of MDS/MPN  HISTORY OF PRESENTING ILLNESS:   Alexandra Pierce is a wonderful 73 y.o. female who has been referred to [place] by Dr. Eliseo Squires for evaluation and management of symptomatic anemia.   Patient has history of hypertension, dyslipidemia, atrial fibrillation on anticoagulation with previous history of CVA x2, obesity who presented with generalized weakness.  Prior to admission she had an episode of blurring of vision especially in the right eye associated with a spell of confusion and possible loss of consciousness for a few seconds associated with disorientation.  She notes that she has also been feeling progressively fatigued for a few weeks and has had difficulty with dyspnea on exertion with short distances. Her daughters insisted she get evaluated and so she came to the emergency room for further evaluation. She does follow-up with cardiology and is being evaluated by pulmonary at the end of the month as well.  Patient previously has been on Coumadin from 2000 but was switched to Xarelto several years ago.  She notes no overt evidence of black stools or bleeding in the stools, no hematuria no nosebleeds no gum bleeds. Notes that she has been generally eating okay. Does endorse new night sweats over the last month or 2. No abdominal pain or significant change in bowel habits.  In the emergency room the patient had labs which showed hemoglobin of 6.6.5 with an MCV of 96.5.  Normal WBC count of 7.2k with elevated platelets of 685k which on repeat were down to 573k. Reticulocyte counts were relatively suppressed. LDH was elevated to 517 but haptoglobin was within normal limits at 45. Coombs negative Myeloma panel showed no M spike Ferritin level was elevated to 845 suggestive of acute phase  reaction/inflammation. B12 and folate within normal limits.  Patient received 2 units of PRBCs with an appropriate bump in her hemoglobin level and felt much better. CT chest abdomen pelvis was done which showed mild splenomegaly without a focal splenic lesion.  Multiple hepatic cysts.  Abnormal hypoenhancement in the right kidney lower pole of undetermined etiology -broad differential based on radiology input. right perirenal stranding is associated with a small amount of fluid in the adjacent right paracolic gutter, and a small fluid collection along the inferior margin of the liver which could be an exophytic cyst, small complex fluid collection, or conceivably a tumor nodule.  No other significant other lymphadenopathy noted.  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 The patient's last visit with [place] was on 11/22/18. The pt reports that she is doing well overall.   The pt reports that she has been fulling much better since she received 2 units of PRBCs in the interim. She endorses much improved energy levels. The pt has not had any fevers, chills, or concerns for infections. She denies any new abdominal pains or back pains. The pt denies skin rashes or concerns for reaction from her recent blood transfusion.,  Lab results today (01/05/19) of CBC w/diff and CMP is as follows: all values are WNL except for RBC at 2.65, HGB at 9.1, HCT at 28.9, MCV at 109.1, [facility]  at 34.3, RDW at 20.3, Glucose at 167, Creatinine at 1.26, Calcium at 8.1, Total Protein at 6.3, AST at 13, Total Bilirubin at 1.3, GFR at 42.  On review of systems, pt reports improved energy levels, eating well, mild weight gain, and denies fevers, chills, concerns for infections, new abdominal  pains, new back pains, skin rashes, and any other symptoms.   MEDICAL HISTORY:  Past Medical History:  Diagnosis Date  . Chronic lower back pain   . Hypercholesteremia   . Hypertension    mild  . Obesity   . Paroxysmal atrial fibrillation (HCC)    managed with anticoagulation and rate control.   . Stroke [facility]) 2001 & 2015   "right side gets weak sometimes if I'm only tired" (03/16/2016)    SURGICAL HISTORY: Past Surgical History:  Procedure Laterality Date  . CARDIAC CATHETERIZATION  03/31/2000   EF 49%/LAD lg vessel which coursed to the apex & gave rise to 2 diagonal branches/ LAD noted to have 30% ostial lesion & tapers to a sm vessel toward the apex but no high grade lesion/1st diagonal med sized vessel & 2nd diagonal sm vessel with no significant disease/ lt ventriculogram reveals mild global hypokinesis w/ an estimated EF of 45-50%  . CARDIAC CATHETERIZATION N/A 03/17/2016   Procedure: Left Heart Cath and Coronary Angiography;  Surgeon: Wellington Hampshire, MD;  Location: [facility];  Service: Cardiovascular;  Laterality: N/A;  . LEFT HEART CATH AND CORONARY ANGIOGRAPHY N/A 03/31/2018   Procedure: LEFT HEART CATH AND CORONARY ANGIOGRAPHY;  Surgeon: Martinique, Peter M, MD;  Location: [facility];  Service: Cardiovascular;  Laterality: N/A;    SOCIAL HISTORY: Social History   Socioeconomic History  . Marital status: Married    Spouse name: Not on file  . Number of children: 2  . Years of education: Not on file  . Highest education level: Not on file  Occupational History  . Occupation: Herbalist  Social Needs  . Financial resource strain: Not on file  . Food insecurity:    Worry: Not on file    Inability: Not on file  . Transportation needs:    Medical: Not on file    Non-medical: Not on file  Tobacco Use  . Smoking status: Former Smoker    Packs/day: 0.12    Years: 15.00    Pack years: 1.80    Types: Cigarettes    Last attempt to quit: 10/18/1978     Years since quitting: 40.2  . Smokeless tobacco: Never Used  Substance and Sexual Activity  . Alcohol use: Yes    Alcohol/week: 14.0 standard drinks    Types: 14 Glasses of wine per week  . Drug use: No  . Sexual activity: Yes  Lifestyle  . Physical activity:    Days per week: Not on file    Minutes per session: Not on file  . Stress: Not on file  Relationships  . Social connections:    Talks on phone: Not on file    Gets together: Not on file    Attends religious service: Not on file    Active member of club or organization: Not on file    Attends meetings of clubs or organizations: Not on file    Relationship status: Not on file  . Intimate partner violence:    Fear of current or ex partner: Not on file    Emotionally abused: Not on file    Physically abused: Not on file  Forced sexual activity: Not on file  Other Topics Concern  . Not on file  Social History Narrative  . Not on file    FAMILY HISTORY: Family History  Problem Relation Age of Onset  . Heart disease Father   . Asthma Father   . Diabetes Father   . Heart disease Mother   . Heart disease Brother   . Hyperlipidemia Sister   . Hyperlipidemia Brother   . Hyperlipidemia Brother   . Cancer Neg Hx     ALLERGIES:  is allergic to sulfonamide derivatives; lipitor [atorvastatin]; pravachol [pravastatin sodium]; and statins.  MEDICATIONS:  Current Outpatient Medications  Medication Sig Dispense Refill  . acetaminophen (TYLENOL) 500 MG tablet Take 1,000 mg by mouth as needed for mild pain or headache.     . cholecalciferol (VITAMIN D) 1000 UNITS tablet Take 1,000 Units by mouth daily.    . Cyanocobalamin (B-12) 1000 MCG SUBL Place 1,000 mcg under the tongue daily. 30 each 3  . fenofibrate 160 MG tablet Take 1 tablet (160 mg total) by mouth daily. 90 tablet 3  . ipratropium (ATROVENT) 0.06 % nasal spray Place 2 sprays into both nostrils every 4 (four) hours as needed for rhinitis. (Patient not taking:  Reported on 07/25/2018) 15 mL 5  . metoprolol tartrate (LOPRESSOR) 25 MG tablet Take 0.5 tablets (12.5 mg total) by mouth 2 (two) times daily. 90 tablet 0  . rivaroxaban (XARELTO) 20 MG TABS tablet Take 1 tablet (20 mg total) by mouth daily. 30 tablet 11   No current facility-administered medications for this visit.     REVIEW OF SYSTEMS:    A 10+ POINT REVIEW OF SYSTEMS WAS OBTAINED including neurology, dermatology, psychiatry, cardiac, respiratory, lymph, extremities, GI, GU, Musculoskeletal, constitutional, breasts, reproductive, HEENT.  All pertinent positives are noted in the HPI.  All others are negative.   PHYSICAL EXAMINATION: ECOG PERFORMANCE STATUS: 1 - Symptomatic but completely ambulatory  Vitals:   01/05/19 0839  BP: 110/69  Pulse: 80  Resp: 18  Temp: 97.9 F (36.6 C)  SpO2: 100%   Filed Weights   01/05/19 0839  Weight: 181 lb 11.2 oz (82.4 kg)   .Body mass index is 30.24 kg/m.  GENERAL:alert, in no acute distress and comfortable SKIN: no acute rashes, no significant lesions EYES: conjunctiva are pink and non-injected, sclera anicteric OROPHARYNX: MMM, no exudates, no oropharyngeal erythema or ulceration NECK: supple, no JVD LYMPH:  no palpable lymphadenopathy in the cervical, axillary or inguinal regions LUNGS: clear to auscultation b/l with normal respiratory effort HEART: Irregular S1S2 ABDOMEN:  normoactive bowel sounds , non tender, not distended. No palpable hepatosplenomegaly.  Extremity: trace pedal edema PSYCH: alert & oriented x 3 with fluent speech NEURO: no focal motor/sensory deficits    LABORATORY DATA:  I have reviewed the data as listed  . CBC Latest Ref Rng & Units 01/05/2019 12/28/2018 11/22/2018  WBC 4.0 - 10.5 K/uL 5.3 6.6 4.5  Hemoglobin 12.0 - 15.0 g/dL 9.1(L) 6.9(LL) 7.8(L)  Hematocrit 36.0 - 46.0 % 28.9(L) 21.0(L) 23.7(L)  Platelets 150 - 400 K/uL 308 339 392    . CMP Latest Ref Rng & Units 01/05/2019 12/28/2018 11/22/2018  Glucose  70 - 99 mg/dL 167(H) 105(H) 107(H)  BUN 8 - 23 mg/dL 21 22 23   Creatinine 0.44 - 1.00 mg/dL 1.26(H) 1.24(H) 1.07(H)  Sodium 135 - 145 mmol/L 142 143 141  Potassium 3.5 - 5.1 mmol/L 3.8 4.2 3.9  Chloride 98 - 111 mmol/L 109 108 108  CO2 22 - 32 mmol/L 22 25 25   Calcium 8.9 - 10.3 mg/dL 8.1(L) 8.5(L) 8.8(L)  Total Protein 6.5 - 8.1 g/dL 6.3(L) 6.5 7.2  Total Bilirubin 0.3 - 1.2 mg/dL 1.3(H) 3.1(H) 2.4(H)  Alkaline Phos 38 - 126 U/L 39 40 38  AST 15 - 41 U/L 13(L) 21 32  ALT 0 - 44 U/L 16 14 17    05/31/18 Molecular Pathology:   05/24/18 Cytogenetics:     05/24/18 BM Bx:   05/24/18 BM Flow cytometry:     RADIOGRAPHIC STUDIES: I have personally reviewed the radiological images as listed and agreed with the findings in the report. No results found.  ASSESSMENT & PLAN:   73 y.o. female with  1. 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 No overt evidence of bleeding.  In the emergency room the patient had labs which showed hemoglobin of 6.6.5 with an MCV of 96.5.  Normal WBC count of 7.2k with elevated platelets of 685k which on repeat were down to 573k. Reticulocyte counts were relatively suppressed. LDH was elevated to 517 but haptoglobin was within normal limits at 45. Coombs negative Myeloma panel showed no M spike Ferritin level was elevated to 845 suggestive of acute phase reaction/inflammation. B12 and folate within normal limits.  Patient received 2 units of PRBCs with an appropriate bump in her hemoglobin level and felt much better.  05/24/18 BM Bx results which revealed erythroid and megakaryocytic proliferation and ring sideroblasts concerning for myeloproliferative/ myelodysplastic neoplasm   No excess blasts; Acute leukemia and lymphoma are not indicated   2. Rt renal radiographic abnormality as noted in CT with some fluid collection. 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 No  fevers chills to suggest acute pyelonephritis or renal abscess. Urine analysis showed trace leuk esterase and 6-10 WBCs no significant hematuria. Broad differential diagnosis based on CT findings as per radiologist. Procalcitonin levels unimpressive  05/23/18 CT Chest Abdomen Pelvis was done which showed mild splenomegaly without a focal splenic lesion. Multiple hepatic cysts.  Abnormal hypoenhancement in the right kidney lower pole of undetermined etiology -broad differential based on radiology input. right perirenal stranding is associated with a small amount of fluid in the adjacent right paracolic gutter, and a small fluid collection along the inferior margin of the liver which could be an exophytic cyst, small complex fluid collection, or conceivably a tumor nodule. No other significant other lymphadenopathy noted.   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 WBC and PLT continue to be normal.  -No indication for blood transfusion today. -Will consider Erythropoietin risks and benefits if PLT continue to remain stable. Risk of blood clots and HTN, benefits of limiting frequency of transfusions -Discussed that the pt is requiring blood transfusions about every 1-1.5 months.  Blood warmer to be used considering cold antibodies.  -Despite patient's Jak2 mutation, her thrombocytosis has been improving. No indication for hydroxyurea at this time. -Pt is taking Xarelto for atrial fibrillation which is protective for her JAK2 mutation related thrombocytosis as well. -Continue Vitamin B complex  -Advised crowd avoidance and infection prevention strategies -Will check labs again in one month, with tentative PRBC transfusion -Will see the pt back in 2 months   plz schedule for labs and 1 unit of PRBC transfusion in 1  months plz schedule for  labs and 1 unit of PRBC transfusion and MD visit in 2 months   All of the patients questions were answered with apparent satisfaction. The patient knows to call the clinic with any problems, questions or concerns.  The total time spent in the appt was 20 minutes and more than 50% was on counseling and direct patient cares.    Sullivan Lone MD MS AAHIVMS [facility] Hematology/Oncology Physician [facility]  (Office):       [phone] (Work cell):  [phone] (Fax):           [phone]  01/05/2019 9:32 AM  I, Baldwin Jamaica, am acting as a scribe for Dr. Sullivan Lone.   .I have reviewed the above documentation for accuracy and completeness, and I agree with the above. Brunetta Genera MD

## 2019-01-05 ENCOUNTER — Inpatient Hospital Stay: Payer: Medicare Other

## 2019-01-05 ENCOUNTER — Other Ambulatory Visit: Payer: Self-pay

## 2019-01-05 ENCOUNTER — Telehealth: Payer: Self-pay | Admitting: Hematology

## 2019-01-05 ENCOUNTER — Inpatient Hospital Stay (HOSPITAL_BASED_OUTPATIENT_CLINIC_OR_DEPARTMENT_OTHER): Payer: Medicare Other | Admitting: Hematology

## 2019-01-05 VITALS — BP 110/69 | HR 80 | Temp 97.9°F | Resp 18 | Ht 65.0 in | Wt 181.7 lb

## 2019-01-05 DIAGNOSIS — D461 Refractory anemia with ring sideroblasts: Secondary | ICD-10-CM

## 2019-01-05 DIAGNOSIS — D471 Chronic myeloproliferative disease: Secondary | ICD-10-CM

## 2019-01-05 DIAGNOSIS — Z7901 Long term (current) use of anticoagulants: Secondary | ICD-10-CM | POA: Diagnosis not present

## 2019-01-05 DIAGNOSIS — I480 Paroxysmal atrial fibrillation: Secondary | ICD-10-CM | POA: Diagnosis not present

## 2019-01-05 DIAGNOSIS — D649 Anemia, unspecified: Secondary | ICD-10-CM

## 2019-01-05 LAB — CBC WITH DIFFERENTIAL/PLATELET
Abs Immature Granulocytes: 0.04 10*3/uL (ref 0.00–0.07)
Basophils Absolute: 0.1 10*3/uL (ref 0.0–0.1)
Basophils Relative: 1 %
Eosinophils Absolute: 0.1 10*3/uL (ref 0.0–0.5)
Eosinophils Relative: 2 %
HEMATOCRIT: 28.9 % — AB (ref 36.0–46.0)
Hemoglobin: 9.1 g/dL — ABNORMAL LOW (ref 12.0–15.0)
Immature Granulocytes: 1 %
LYMPHS ABS: 2.1 10*3/uL (ref 0.7–4.0)
Lymphocytes Relative: 40 %
MCH: 34.3 pg — ABNORMAL HIGH (ref 26.0–34.0)
MCHC: 31.5 g/dL (ref 30.0–36.0)
MCV: 109.1 fL — ABNORMAL HIGH (ref 80.0–100.0)
Monocytes Absolute: 0.2 10*3/uL (ref 0.1–1.0)
Monocytes Relative: 4 %
Neutro Abs: 2.8 10*3/uL (ref 1.7–7.7)
Neutrophils Relative %: 52 %
Platelets: 308 10*3/uL (ref 150–400)
RBC: 2.65 MIL/uL — ABNORMAL LOW (ref 3.87–5.11)
RDW: 20.3 % — ABNORMAL HIGH (ref 11.5–15.5)
WBC: 5.3 10*3/uL (ref 4.0–10.5)
nRBC: 0 % (ref 0.0–0.2)

## 2019-01-05 LAB — CMP (CANCER CENTER ONLY)
ALT: 16 U/L (ref 0–44)
AST: 13 U/L — AB (ref 15–41)
Albumin: 3.8 g/dL (ref 3.5–5.0)
Alkaline Phosphatase: 39 U/L (ref 38–126)
Anion gap: 11 (ref 5–15)
BUN: 21 mg/dL (ref 8–23)
CO2: 22 mmol/L (ref 22–32)
Calcium: 8.1 mg/dL — ABNORMAL LOW (ref 8.9–10.3)
Chloride: 109 mmol/L (ref 98–111)
Creatinine: 1.26 mg/dL — ABNORMAL HIGH (ref 0.44–1.00)
GFR, Est AFR Am: 49 mL/min — ABNORMAL LOW (ref >60)
GFR, Estimated: 42 mL/min — ABNORMAL LOW (ref >60)
GLUCOSE: 167 mg/dL — AB (ref 70–99)
Potassium: 3.8 mmol/L (ref 3.5–5.1)
Sodium: 142 mmol/L (ref 135–145)
Total Bilirubin: 1.3 mg/dL — ABNORMAL HIGH (ref 0.3–1.2)
Total Protein: 6.3 g/dL — ABNORMAL LOW (ref 6.5–8.1)

## 2019-01-05 LAB — SAMPLE TO BLOOD BANK

## 2019-01-05 NOTE — Telephone Encounter (Signed)
Gave avs and calendar ° °

## 2019-01-08 ENCOUNTER — Ambulatory Visit: Payer: Medicare Other | Admitting: Nurse Practitioner

## 2019-02-05 ENCOUNTER — Inpatient Hospital Stay: Payer: Medicare Other

## 2019-02-05 ENCOUNTER — Inpatient Hospital Stay: Payer: Medicare Other | Attending: Hematology

## 2019-02-05 ENCOUNTER — Other Ambulatory Visit: Payer: Self-pay

## 2019-02-05 DIAGNOSIS — D461 Refractory anemia with ring sideroblasts: Secondary | ICD-10-CM | POA: Diagnosis present

## 2019-02-05 DIAGNOSIS — D471 Chronic myeloproliferative disease: Secondary | ICD-10-CM | POA: Diagnosis not present

## 2019-02-05 DIAGNOSIS — D649 Anemia, unspecified: Secondary | ICD-10-CM

## 2019-02-05 LAB — CBC WITH DIFFERENTIAL/PLATELET
Abs Immature Granulocytes: 0.03 10*3/uL (ref 0.00–0.07)
Basophils Absolute: 0.1 10*3/uL (ref 0.0–0.1)
Basophils Relative: 1 %
Eosinophils Absolute: 0.1 10*3/uL (ref 0.0–0.5)
Eosinophils Relative: 2 %
HCT: 31.5 % — ABNORMAL LOW (ref 36.0–46.0)
Hemoglobin: 10.9 g/dL — ABNORMAL LOW (ref 12.0–15.0)
Immature Granulocytes: 1 %
Lymphocytes Relative: 28 %
Lymphs Abs: 1.3 10*3/uL (ref 0.7–4.0)
MCH: 38.5 pg — ABNORMAL HIGH (ref 26.0–34.0)
MCHC: 34.6 g/dL (ref 30.0–36.0)
MCV: 111.3 fL — ABNORMAL HIGH (ref 80.0–100.0)
Monocytes Absolute: 0.2 10*3/uL (ref 0.1–1.0)
Monocytes Relative: 4 %
Neutro Abs: 3 10*3/uL (ref 1.7–7.7)
Neutrophils Relative %: 64 %
Platelets: 356 10*3/uL (ref 150–400)
RBC: 2.83 MIL/uL — ABNORMAL LOW (ref 3.87–5.11)
RDW: 17.4 % — ABNORMAL HIGH (ref 11.5–15.5)
WBC: 4.7 10*3/uL (ref 4.0–10.5)
nRBC: 0 % (ref 0.0–0.2)

## 2019-02-05 LAB — SAMPLE TO BLOOD BANK

## 2019-02-05 LAB — CMP (CANCER CENTER ONLY)
ALT: 24 U/L (ref 0–44)
AST: 26 U/L (ref 15–41)
Albumin: 4.1 g/dL (ref 3.5–5.0)
Alkaline Phosphatase: 58 U/L (ref 38–126)
Anion gap: 10 (ref 5–15)
BUN: 12 mg/dL (ref 8–23)
CO2: 25 mmol/L (ref 22–32)
Calcium: 8.5 mg/dL — ABNORMAL LOW (ref 8.9–10.3)
Chloride: 108 mmol/L (ref 98–111)
Creatinine: 0.92 mg/dL (ref 0.44–1.00)
GFR, Est AFR Am: >60 mL/min (ref >60)
GFR, Estimated: >60 mL/min (ref >60)
Glucose, Bld: 110 mg/dL — ABNORMAL HIGH (ref 70–99)
Potassium: 3.9 mmol/L (ref 3.5–5.1)
Sodium: 143 mmol/L (ref 135–145)
Total Bilirubin: 1.5 mg/dL — ABNORMAL HIGH (ref 0.3–1.2)
Total Protein: 6.8 g/dL (ref 6.5–8.1)

## 2019-02-05 NOTE — Progress Notes (Unsigned)
Pt aware of today's Hgb results. No need for transfusion today. Dr. Irene Limbo notified. Pt to f/u as scheduled and to call with any concerns

## 2019-02-06 LAB — ERYTHROPOIETIN: Erythropoietin: 27.9 m[IU]/mL — ABNORMAL HIGH (ref 2.6–18.5)

## 2019-02-13 ENCOUNTER — Telehealth: Payer: Self-pay | Admitting: *Deleted

## 2019-02-13 NOTE — Telephone Encounter (Signed)

## 2019-02-15 NOTE — Progress Notes (Signed)
Telehealth Visit     Virtual Visit via Video Note   This visit type was conducted due to national recommendations for restrictions regarding the COVID-19 Pandemic (e.g. social distancing) in an effort to limit this patient's exposure and mitigate transmission in our community.  Due to her co-morbid illnesses, this patient is at least at moderate risk for complications without adequate follow up.  This format is felt to be most appropriate for this patient at this time.  All issues noted in this document were discussed and addressed.  A limited physical exam was performed with this format.  Please refer to the patient's chart for her consent to telehealth for [facility].   Evaluation Performed:  Follow-up visit  This visit type was conducted due to national recommendations for restrictions regarding the COVID-19 Pandemic (e.g. social distancing).  This format is felt to be most appropriate for this patient at this time.  All issues noted in this document were discussed and addressed.  No physical exam was performed (except for noted visual exam findings with Video Visits).  Please refer to the patient's chart (MyChart message for video visits and phone note for telephone visits) for the patient's consent to telehealth for [facility]  Date:  02/16/2019   ID:  Alexandra, Pierce Mar 11, 1946, MRN [id number]  Patient Location:  Home  Provider location:   Home  PCP:  Chesley Noon, MD  Cardiologist:  Servando Snare Allred Electrophysiologist:  Allred  Chief Complaint:  Follow up visit.   History of Present Illness:    Alexandra Pierce is a 73 y.o. female who presents via audio/video conferencing for a telehealth visit today.  She is seen for Alexandra Pierce - former patient of AlexandraTennant's. Primarily follows with me.   She has PAF, remote stroke, and on chronic anticoagulation - now on Xarelto due to labile readings with her coumadin. Other issues include HLD, HTN and obesity. She has had statin  intolerance. Prior cath in 2001 with mild CAD and EF of 45 to 50%.  Hospitalized back in March of 2015 with recurrent stroke - multiple infarcts on MRI. Had missed several doses of her Xarelto. Was in atrial fib on arrival to the ER at that time. Understood the need to NOT miss her Xarelto. She had been traveling and really got out of her routine when that happened.   Discharged from [facility] on June 1 of 2017 - had chest pain - was in atrial fib - she was cathed - EF haddropped but was normal by echo. Mild to moderate non obstructive CAD noted on cath with medical management recommended. The LVEF noted to be reduced on LV gram but echo that same day showed normal LV function.I hadDr. Myles Pierce the echo images and the inferior wallwas noted to be movingwell. He estimateed her overall LVEF by echo to beabout50%. He speculated that theabnormality on LV function noted on LV gram may have been due to PVCs just before the imaging. Flecainide was stopped, metoprolol added and she is now managed with rate control and continued anticoagulation.   Ihave followed her since - she has done ok - medicines have gotten mixed up from time to time. At last visit in October 30, 2017 - her husband died back in 06/30/17 and brother had died in 10-01-23. Had lost her PCP. She was pretty overwhelmed. Cardiac status ok.I saw her back in June of 2019 - she was not doing well - lots of exertional chest pain  and shortness of breath - she was referred for cardiac cath - this turned out ok - she continues with medical management from our standpoint. Her symptoms however persisted - she was referred to pulmonary.   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 She has been noted to have markedly low diffusion  capacity on PFTs (that was not corrected for her remarkably low hemoglobin - seeing Alexandra Pierce - he suspects her diffusion capacity is probably closer to normal. 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 She has required transfusions about every 1 to 1 1/2 months. She has to have a blood warmer.   The patient does not have symptoms concerning for COVID-19 infection (fever, chills, cough, or new shortness of breath).   Seen today - we attempted to do this via Doximity video and had to go to just a phone call due to technical difficulties. She has consented for this visit. She is doing well. Trying to get her anemia under control. She notes her platelets have returned to normal. She has just gone 30 days without need for a transfusion. She is starting to get back to walking. She is feeling better - has more energy. She did retire from her job due to her anemia and the associated symptoms she was having. No chest pain. BP is great. She needs her fenofibrate refilled. No bleeding. No falls.  She has no real concerns from our standpoint today.     Past Medical History:  Diagnosis Date  . Chronic lower back pain   . Hypercholesteremia   . Hypertension    mild  . Obesity   . Paroxysmal atrial fibrillation (HCC)    managed with anticoagulation and rate control.   . Stroke [facility]) 2001 & 2015   "right side gets weak sometimes if I'm only tired" (03/16/2016)   Past Surgical History:  Procedure Laterality Date  . CARDIAC CATHETERIZATION  03/31/2000   EF 49%/LAD lg vessel which coursed to the apex & gave rise to 2 diagonal branches/ LAD noted to have 30% ostial lesion & tapers to a sm vessel toward the apex but no high grade lesion/1st diagonal med sized vessel & 2nd diagonal sm vessel with no significant disease/ lt ventriculogram reveals mild global hypokinesis w/ an  estimated EF of 45-50%  . CARDIAC CATHETERIZATION N/A 03/17/2016   Procedure: Left Heart Cath and Coronary Angiography;  Surgeon: Wellington Hampshire, MD;  Location: [place] [facility];  Service: Cardiovascular;  Laterality: N/A;  . LEFT HEART CATH AND CORONARY ANGIOGRAPHY N/A 03/31/2018   Procedure: LEFT HEART CATH AND CORONARY ANGIOGRAPHY;  Surgeon: Martinique, Peter M, MD;  Location: [place] [facility];  Service: Cardiovascular;  Laterality: N/A;     Current Meds  Medication Sig  . acetaminophen (TYLENOL) 500 MG tablet Take 1,000 mg by mouth as needed for mild pain or headache.   . B Complex-C (B-COMPLEX WITH VITAMIN C) tablet Take 1 tablet by mouth daily.  . cholecalciferol (VITAMIN D) 1000 UNITS tablet Take 1,000 Units by mouth daily.  . Cyanocobalamin (B-12) 1000 MCG SUBL Place 1,000 mcg under the tongue daily.  [name] Kitchen  fenofibrate 160 MG tablet Take 1 tablet (160 mg total) by mouth daily.  [name] Kitchen loratadine (CLARITIN) 10 MG tablet Take 10 mg by mouth daily.  . metoprolol tartrate (LOPRESSOR) 25 MG tablet Take 0.5 tablets (12.5 mg total) by mouth 2 (two) times daily.  . rivaroxaban (XARELTO) 20 MG TABS tablet Take 1 tablet (20 mg total) by mouth daily.     Allergies:   Sulfonamide derivatives; Lipitor [atorvastatin]; Pravachol [pravastatin sodium]; and Statins   Social History   Tobacco Use  . Smoking status: Former Smoker    Packs/day: 0.12    Years: 15.00    Pack years: 1.80    Types: Cigarettes    Last attempt to quit: 10/18/1978    Years since quitting: 40.3  . Smokeless tobacco: Never Used  Substance Use Topics  . Alcohol use: Yes    Alcohol/week: 14.0 standard drinks    Types: 14 Glasses of wine per week  . Drug use: No     Family Hx: The patient's family history includes Asthma in her father; Diabetes in her father; Heart disease in her brother, father, and mother; Hyperlipidemia in her brother, brother, and sister. There is no history of Cancer.  ROS:   Please see the history of  present illness.   All other systems reviewed are negative.    Objective:    Vital Signs:  BP 110/69   Pulse 80   Ht 5\' 5"  (1.651 m)   Wt 179 lb (81.2 kg)   BMI 29.79 kg/m    Wt Readings from Last 3 Encounters:  02/16/19 179 lb (81.2 kg)  01/05/19 181 lb 11.2 oz (82.4 kg)  11/22/18 179 lb 4.8 oz (81.3 kg)    Alert female in no acute distress. She is not short of breath with conversation. Appropriate in responses.   Labs/Other Tests and Data Reviewed:    Lab Results  Component Value Date   WBC 4.7 02/05/2019   HGB 10.9 (L) 02/05/2019   HCT 31.5 (L) 02/05/2019   PLT 356 02/05/2019   GLUCOSE 110 (H) 02/05/2019   CHOL 201 (H) 03/27/2018   TRIG 75 03/27/2018   HDL 51 03/27/2018   LDLDIRECT 163.2 01/19/2013   LDLCALC 135 (H) 03/27/2018   ALT 24 02/05/2019   AST 26 02/05/2019   NA 143 02/05/2019   K 3.9 02/05/2019   CL 108 02/05/2019   CREATININE 0.92 02/05/2019   BUN 12 02/05/2019   CO2 25 02/05/2019   TSH 1.626 05/22/2018   INR 1.29 05/23/2018   HGBA1C 5.6 01/11/2014     BNP (last 3 results) No results for input(s): BNP in the last 8760 hours.  ProBNP (last 3 results) No results for input(s): PROBNP in the last 8760 hours.    Prior CV studies:    The following studies were reviewed today:   LEFT HEART CATH AND CORONARY ANGIOGRAPHY June 2019  Conclusion     Dist LAD lesion is 40% stenosed.  Mid LAD lesion is 20% stenosed.  Ost LAD lesion is 30% stenosed.  Mid Cx lesion is 20% stenosed.  Mid RCA lesion is 20% stenosed.  The left ventricular systolic function is normal.  LV end diastolic pressure is normal.  The left ventricular ejection fraction is 55-65% by visual estimate.  1. Mild nonobstructive CAD 2. Normal LV function 3. Normal LVEDP  Plan: There is no change compared to 2017. I cannot explain her symptoms based on findings today.   Echo Study Conclusions from 02/2016  -  Left ventricle: The cavity size was normal. Systolic  function was  normal. The estimated ejection fraction was 55%.Wall motion was  normal; there were no regional wall motion abnormalities. Normal  sinus rhythm was absent. The study is not technically sufficient  to allow evaluation of LV diastolic function. - Aortic valve: Moderately calcified annulus. Trileaflet; mildly  thickened, mildly calcified leaflets. - Mitral valve: There was mild regurgitation. - Left atrium: The atrium was moderately dilated. - Pulmonary arteries: PA peak pressure: 32 mm Hg (S).     ASSESSMENT & PLAN:     1. CAD with prior angina/shortness of breath with exertion:her cath demonstrated very reassuring findings. Her symptoms last summer were most certainly related to the RARS. Would favor continued medical management with CV risk factor modification.   2. 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 Irene Limbo. She is getting routine lab and transfusions. This was the etiology for her symptoms last summer however, blood count was basically normal then.   3. Persistent AF - managed with rate control and anticoagulation. HR is fine.   4. HLD: intolerant to statins.She has never had ideal lipids.Continue Fenofibrate - this is refilled for her today.She will need lab on return visit.    5. HTN:Her blood pressure is great. No changes recommended today.   6. Chronic anticoagulation - remains on Xarelto - now noted to have RARS  7. Prior stroke- minimal residual deficit.  8. Carotid disease-her last study from January showed 1 to 39% bilateral disease - will plan to repeat in January of 2021. Not discussed today.        9. COVID-19 Education:      The signs and symptoms of COVID-19 were discussed with the patient and how to seek care for testing (follow up with PCP or arrange E-visit).  The importance of social distancing, staying at home, hand hygiene and wearing a mask when out in public were discussed today.  Patient  Risk:   After full review of this patient's clinical status, I feel that they are at least moderate risk at this time.  Time:   Today, I have spent 9 minutes with the patient with telehealth technology discussing the above issues.     Medication Adjustments/Labs and Tests Ordered: Current medicines are reviewed at length with the patient today.  Concerns regarding medicines are outlined above.   Tests Ordered: No orders of the defined types were placed in this encounter.   Medication Changes: No orders of the defined types were placed in this encounter.   Disposition:  FU with me in 6 months.   Patient is agreeable to this plan and will call if any problems develop in the interim.   Amie Critchley, NP  02/16/2019 11:58 AM    [place]

## 2019-02-16 ENCOUNTER — Telehealth (INDEPENDENT_AMBULATORY_CARE_PROVIDER_SITE_OTHER): Payer: Medicare Other | Admitting: Nurse Practitioner

## 2019-02-16 ENCOUNTER — Encounter: Payer: Self-pay | Admitting: Nurse Practitioner

## 2019-02-16 ENCOUNTER — Other Ambulatory Visit: Payer: Self-pay

## 2019-02-16 VITALS — BP 110/69 | HR 80 | Ht 65.0 in | Wt 179.0 lb

## 2019-02-16 DIAGNOSIS — I259 Chronic ischemic heart disease, unspecified: Secondary | ICD-10-CM

## 2019-02-16 DIAGNOSIS — E7849 Other hyperlipidemia: Secondary | ICD-10-CM

## 2019-02-16 DIAGNOSIS — R0602 Shortness of breath: Secondary | ICD-10-CM

## 2019-02-16 DIAGNOSIS — Z7901 Long term (current) use of anticoagulants: Secondary | ICD-10-CM

## 2019-02-16 DIAGNOSIS — Z7189 Other specified counseling: Secondary | ICD-10-CM

## 2019-02-16 DIAGNOSIS — I482 Chronic atrial fibrillation, unspecified: Secondary | ICD-10-CM

## 2019-02-16 MED ORDER — FENOFIBRATE 160 MG PO TABS: 160.0000 mg | ORAL_TABLET | Freq: Every day | ORAL | 3 refills | Status: AC

## 2019-02-16 NOTE — Patient Instructions (Addendum)
After Visit Summary:  We will be checking the following labs today - NONE   Medication Instructions:    Continue with your current medicines.   I sent in your refill for the fenofibrate today.    If you need a refill on your cardiac medications before your next appointment, please call your pharmacy.     Testing/Procedures To Be Arranged:  N/A  Follow-Up:   See me in 6 months.     At [facility], you and your health needs are our priority.  As part of our continuing mission to provide you with exceptional heart care, we have created designated Provider Care Teams.  These Care Teams include your primary Cardiologist (physician) and Advanced Practice Providers (APPs -  Physician Assistants and Nurse Practitioners) who all work together to provide you with the care you need, when you need it.  Special Instructions:  . Stay safe, stay home, wash your hands for at least 20 seconds and wear a mask when out in public.  . It was good to talk with you today.    Call the [place] office at [phone] if you have any questions, problems or concerns.

## 2019-02-27 ENCOUNTER — Telehealth: Payer: Self-pay | Admitting: Hematology

## 2019-02-27 NOTE — Telephone Encounter (Signed)
Returned patient's phone call regarding rescheduling an appointment, patient decided to come appointments and confirmed time.

## 2019-03-08 ENCOUNTER — Telehealth: Payer: Self-pay | Admitting: *Deleted

## 2019-03-08 NOTE — Telephone Encounter (Signed)
Patient contacted by [facility] screener. Returned from trip to [facility] 2 days ago. Wants to cancel appts for 5/22 and reschedule appts for first week in June. Appts for 5/21 cancelled. Schedule message sent - they will contact patient

## 2019-03-09 ENCOUNTER — Inpatient Hospital Stay: Payer: Medicare Other | Admitting: Hematology

## 2019-03-09 ENCOUNTER — Inpatient Hospital Stay: Payer: Medicare Other

## 2019-03-15 ENCOUNTER — Telehealth: Payer: Self-pay | Admitting: Hematology

## 2019-03-15 NOTE — Telephone Encounter (Signed)
Spoke with patient re appointments 6/2 and 6/3. Patient aware appointments cannot be accommodated all on the same day.

## 2019-03-19 NOTE — Progress Notes (Signed)
HEMATOLOGY/ONCOLOGY CLINIC NOTE  Date of Service: 03/20/2019  Patient Care Team: Chesley Noon, MD as PCP - General (Family Medicine)  CHIEF COMPLAINTS/PURPOSE OF CONSULTATION:  mx of MDS/MPN  HISTORY OF PRESENTING ILLNESS:   Alexandra Pierce is a wonderful 73 y.o. female who has been referred to [place] by Dr. Eliseo Squires for evaluation and management of symptomatic anemia.   Patient has history of hypertension, dyslipidemia, atrial fibrillation on anticoagulation with previous history of CVA x2, obesity who presented with generalized weakness.  Prior to admission she had an episode of blurring of vision especially in the right eye associated with a spell of confusion and possible loss of consciousness for a few seconds associated with disorientation.  She notes that she has also been feeling progressively fatigued for a few weeks and has had difficulty with dyspnea on exertion with short distances. Her daughters insisted she get evaluated and so she came to the emergency room for further evaluation. She does follow-up with cardiology and is being evaluated by pulmonary at the end of the month as well.  Patient previously has been on Coumadin from 2000 but was switched to Xarelto several years ago.  She notes no overt evidence of black stools or bleeding in the stools, no hematuria no nosebleeds no gum bleeds. Notes that she has been generally eating okay. Does endorse new night sweats over the last month or 2. No abdominal pain or significant change in bowel habits.  In the emergency room the patient had labs which showed hemoglobin of 6.6.5 with an MCV of 96.5.  Normal WBC count of 7.2k with elevated platelets of 685k which on repeat were down to 573k. Reticulocyte counts were relatively suppressed. LDH was elevated to 517 but haptoglobin was within normal limits at 45. Coombs negative Myeloma panel showed no M spike Ferritin level was elevated to 845 suggestive of acute phase  reaction/inflammation. B12 and folate within normal limits.  Patient received 2 units of PRBCs with an appropriate bump in her hemoglobin level and felt much better. CT chest abdomen pelvis was done which showed mild splenomegaly without a focal splenic lesion.  Multiple hepatic cysts.  Abnormal hypoenhancement in the right kidney lower pole of undetermined etiology -broad differential based on radiology input. right perirenal stranding is associated with a small amount of fluid in the adjacent right paracolic gutter, and a small fluid collection along the inferior margin of the liver which could be an exophytic cyst, small complex fluid collection, or conceivably a tumor nodule.  No other significant other lymphadenopathy noted.  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 The patient's last visit with [place] was on 01/05/19.  The pt reports that she has "felt very good" in the interim. She denies having developed any new concerns in the interim. She notes that she has been eating very well, has gained some weight. She notes that her ankles have swelled a little bit, eating more salt recently. She notes that she has been staying well hydrated. She notes that she has not had any fevers, chills, or concerns for infections.  Lab results today (03/20/19) of CBC w/diff is as follows: all values are WNL except for RBC at 3.65, HGB at 12.3, MCV at 104.7, PLT at 451k.  On  review of systems, pt reports improved energy levels, staying hydrated, eating well, mild weight gain, and denies fevers, chills, cough, concerns for infections, abdominal pains, and any other symptoms.    MEDICAL HISTORY:  Past Medical History:  Diagnosis Date  . Chronic lower back pain   .  Hypercholesteremia   . Hypertension    mild  . Obesity   . Paroxysmal atrial fibrillation (HCC)    managed with anticoagulation and rate control.   . Stroke [facility]) 2001 & 2015   "right side gets weak sometimes if I'm only tired" (03/16/2016)    SURGICAL HISTORY: Past Surgical History:  Procedure Laterality Date  . CARDIAC CATHETERIZATION  03/31/2000   EF 49%/LAD lg vessel which coursed to the apex & gave rise to 2 diagonal branches/ LAD noted to have 30% ostial lesion & tapers to a sm vessel toward the apex but no high grade lesion/1st diagonal med sized vessel & 2nd diagonal sm vessel with no significant disease/ lt ventriculogram reveals mild global hypokinesis w/ an estimated EF of 45-50%  . CARDIAC CATHETERIZATION N/A 03/17/2016   Procedure: Left Heart Cath and Coronary Angiography;  Surgeon: Wellington Hampshire, MD;  Location: [place] [facility];  Service: Cardiovascular;  Laterality: N/A;  . LEFT HEART CATH AND CORONARY ANGIOGRAPHY N/A 03/31/2018   Procedure: LEFT HEART CATH AND CORONARY ANGIOGRAPHY;  Surgeon: Martinique, Peter M, MD;  Location: [place] [facility];  Service: Cardiovascular;  Laterality: N/A;    SOCIAL HISTORY: Social History   Socioeconomic History  . Marital status: Married    Spouse name: Not on file  . Number of children: 2  . Years of education: Not on file  . Highest education level: Not on file  Occupational History  . Occupation: Herbalist  Social Needs  . Financial resource strain: Not on file  . Food insecurity:    Worry: Not on file    Inability: Not on file  . Transportation needs:    Medical: Not on file    Non-medical: Not on file  Tobacco Use  . Smoking status: Former Smoker    Packs/day: 0.12    Years: 15.00    Pack years: 1.80    Types: Cigarettes    Last attempt to quit: 10/18/1978    Years since quitting: 40.4  . Smokeless tobacco: Never Used  Substance and Sexual Activity  . Alcohol use: Yes    Alcohol/week: 14.0 standard drinks     Types: 14 Glasses of wine per week  . Drug use: No  . Sexual activity: Yes  Lifestyle  . Physical activity:    Days per week: Not on file    Minutes per session: Not on file  . Stress: Not on file  Relationships  . Social connections:    Talks on phone: Not on file    Gets together: Not on file    Attends religious service: Not on file    Active member of club or organization: Not on file    Attends meetings of clubs or organizations: Not on file    Relationship status: Not on file  . Intimate partner violence:    Fear of current or ex partner: Not on file    Emotionally abused: Not on file    Physically abused: Not on file    Forced sexual activity: Not on file  Other Topics Concern  . Not on file  Social History Narrative  . Not on file    FAMILY HISTORY:  Family History  Problem Relation Age of Onset  . Heart disease Father   . Asthma Father   . Diabetes Father   . Heart disease Mother   . Heart disease Brother   . Hyperlipidemia Sister   . Hyperlipidemia Brother   . Hyperlipidemia Brother   . Cancer Neg Hx     ALLERGIES:  is allergic to sulfonamide derivatives; lipitor [atorvastatin]; pravachol [pravastatin sodium]; and statins.  MEDICATIONS:  Current Outpatient Medications  Medication Sig Dispense Refill  . acetaminophen (TYLENOL) 500 MG tablet Take 1,000 mg by mouth as needed for mild pain or headache.     . B Complex-C (B-COMPLEX WITH VITAMIN C) tablet Take 1 tablet by mouth daily.    . cholecalciferol (VITAMIN D) 1000 UNITS tablet Take 1,000 Units by mouth daily.    . Cyanocobalamin (B-12) 1000 MCG SUBL Place 1,000 mcg under the tongue daily. 30 each 3  . fenofibrate 160 MG tablet Take 1 tablet (160 mg total) by mouth daily. 90 tablet 3  . loratadine (CLARITIN) 10 MG tablet Take 10 mg by mouth daily.    . metoprolol tartrate (LOPRESSOR) 25 MG tablet Take 0.5 tablets (12.5 mg total) by mouth 2 (two) times daily. 90 tablet 0  . rivaroxaban (XARELTO) 20 MG  TABS tablet Take 1 tablet (20 mg total) by mouth daily. 30 tablet 11   No current facility-administered medications for this visit.     REVIEW OF SYSTEMS:    A 10+ POINT REVIEW OF SYSTEMS WAS OBTAINED including neurology, dermatology, psychiatry, cardiac, respiratory, lymph, extremities, GI, GU, Musculoskeletal, constitutional, breasts, reproductive, HEENT.  All pertinent positives are noted in the HPI.  All others are negative.   PHYSICAL EXAMINATION: ECOG PERFORMANCE STATUS: 1 - Symptomatic but completely ambulatory  Vitals:   03/20/19 1137  BP: 104/70  Pulse: 86  Resp: 18  Temp: 98.5 F (36.9 C)  SpO2: 97%   Filed Weights   03/20/19 1137  Weight: 184 lb 12.8 oz (83.8 kg)   .Body mass index is 30.75 kg/m.  GENERAL:alert, in no acute distress and comfortable SKIN: no acute rashes, no significant lesions EYES: conjunctiva are pink and non-injected, sclera anicteric OROPHARYNX: MMM, no exudates, no oropharyngeal erythema or ulceration NECK: supple, no JVD LYMPH:  no palpable lymphadenopathy in the cervical, axillary or inguinal regions LUNGS: clear to auscultation b/l with normal respiratory effort HEART: Irregular S1S2 ABDOMEN:  normoactive bowel sounds , non tender, not distended. No palpable hepatosplenomegaly.  Extremity: trace pedal edema PSYCH: alert & oriented x 3 with fluent speech NEURO: no focal motor/sensory deficits     LABORATORY DATA:  I have reviewed the data as listed  . CBC Latest Ref Rng & Units 03/20/2019 02/05/2019 01/05/2019  WBC 4.0 - 10.5 K/uL 6.3 4.7 5.3  Hemoglobin 12.0 - 15.0 g/dL 12.3 10.9(L) 9.1(L)  Hematocrit 36.0 - 46.0 % 38.2 31.5(L) 28.9(L)  Platelets 150 - 400 K/uL 451(H) 356 308    . CMP Latest Ref Rng & Units 02/05/2019 01/05/2019 12/28/2018  Glucose 70 - 99 mg/dL 110(H) 167(H) 105(H)  BUN 8 - 23 mg/dL 12 21 22   Creatinine 0.44 - 1.00 mg/dL 0.92 1.26(H) 1.24(H)  Sodium 135 - 145 mmol/L 143 142 143  Potassium 3.5 - 5.1 mmol/L 3.9  3.8 4.2  Chloride 98 - 111 mmol/L 108 109 108  CO2 22 - 32 mmol/L 25 22 25   Calcium 8.9 - 10.3 mg/dL 8.5(L) 8.1(L) 8.5(L)  Total Protein 6.5 - 8.1 g/dL 6.8 6.3(L) 6.5  Total Bilirubin 0.3 - 1.2 mg/dL 1.5(H) 1.3(H) 3.1(H)  Alkaline Phos 38 - 126 U/L 58 39 40  AST 15 - 41 U/L 26 13(L) 21  ALT 0 - 44 U/L 24 16 14    05/31/18 Molecular Pathology:   05/24/18 Cytogenetics:     05/24/18 BM Bx:   05/24/18 BM Flow cytometry:     RADIOGRAPHIC STUDIES: I have personally reviewed the radiological images as listed and agreed with the findings in the report. No results found.  ASSESSMENT & PLAN:   73 y.o. female with  1. 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 No overt evidence of bleeding.  In the emergency room the patient had labs which showed hemoglobin of 6.6.5 with an MCV of 96.5.  Normal WBC count of 7.2k with elevated platelets of 685k which on repeat were down to 573k. Reticulocyte counts were relatively suppressed. LDH was elevated to 517 but haptoglobin was within normal limits at 45. Coombs negative Myeloma panel showed no M spike Ferritin level was elevated to 845 suggestive of acute phase reaction/inflammation. B12 and folate within normal limits.  Patient received 2 units of PRBCs with an appropriate bump in her hemoglobin level and felt much better.  05/24/18 BM Bx results which revealed erythroid and megakaryocytic proliferation and ring sideroblasts concerning for myeloproliferative/ myelodysplastic neoplasm   No excess blasts; Acute leukemia and lymphoma are not indicated   2. Rt renal radiographic abnormality as noted in CT with some fluid collection. 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 No fevers chills to suggest acute pyelonephritis or renal abscess. Urine analysis showed trace leuk esterase and 6-10 WBCs no significant hematuria. Broad differential diagnosis based on CT  findings as per radiologist. Procalcitonin levels unimpressive  05/23/18 CT Chest Abdomen Pelvis was done which showed mild splenomegaly without a focal splenic lesion. Multiple hepatic cysts.  Abnormal hypoenhancement in the right kidney lower pole of undetermined etiology -broad differential based on radiology input. right perirenal stranding is associated with a small amount of fluid in the adjacent right paracolic gutter, and a small fluid collection along the inferior margin of the liver which could be an exophytic cyst, small complex fluid collection, or conceivably a tumor nodule. No other significant other lymphadenopathy noted.   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 -Discussed that we will continue to watch her blood counts again in 2 months  -Pt was previously requiring blood transfusions about every 1-1.5 months.  Blood warmer to be used considering cold antibodies.  -Despite patient's Jak2 mutation, her thrombocytosis has been stable. No indication for hydroxyurea at this time. -Pt is taking Xarelto for atrial fibrillation which is protective for her JAK2 mutation related thrombocytosis as well. -Continue Vitamin B complex  -Advised crowd avoidance and infection prevention strategies -Will see the pt back in 2 months   RTC with Dr Irene Limbo with labs in 2 months   All of the patients questions were answered with apparent satisfaction. The  patient  knows to call the clinic with any problems, questions or concerns.  The total time spent in the appt was 20 minutes and more than 50% was on counseling and direct patient cares.    Sullivan Lone MD MS AAHIVMS [facility] Hematology/Oncology Physician [facility]  (Office):       [phone] (Work cell):  [phone] (Fax):           [phone]  03/20/2019 12:21 PM  I, Baldwin Jamaica, am acting as a scribe for Dr. Sullivan Lone.   .I have reviewed the above documentation for accuracy and completeness, and I agree with the above. Brunetta Genera MD

## 2019-03-20 ENCOUNTER — Other Ambulatory Visit: Payer: Self-pay

## 2019-03-20 ENCOUNTER — Inpatient Hospital Stay: Payer: Medicare Other

## 2019-03-20 ENCOUNTER — Telehealth: Payer: Self-pay | Admitting: Hematology

## 2019-03-20 ENCOUNTER — Inpatient Hospital Stay: Payer: Medicare Other | Attending: Hematology | Admitting: Hematology

## 2019-03-20 VITALS — BP 104/70 | HR 86 | Temp 98.5°F | Resp 18 | Ht 65.0 in | Wt 184.8 lb

## 2019-03-20 DIAGNOSIS — D461 Refractory anemia with ring sideroblasts: Secondary | ICD-10-CM | POA: Insufficient documentation

## 2019-03-20 DIAGNOSIS — I10 Essential (primary) hypertension: Secondary | ICD-10-CM | POA: Insufficient documentation

## 2019-03-20 DIAGNOSIS — I480 Paroxysmal atrial fibrillation: Secondary | ICD-10-CM | POA: Diagnosis not present

## 2019-03-20 DIAGNOSIS — Z7901 Long term (current) use of anticoagulants: Secondary | ICD-10-CM | POA: Insufficient documentation

## 2019-03-20 DIAGNOSIS — D471 Chronic myeloproliferative disease: Secondary | ICD-10-CM

## 2019-03-20 LAB — CBC WITH DIFFERENTIAL/PLATELET
Abs Immature Granulocytes: 0.03 10*3/uL (ref 0.00–0.07)
Basophils Absolute: 0.1 10*3/uL (ref 0.0–0.1)
Basophils Relative: 1 %
Eosinophils Absolute: 0.2 10*3/uL (ref 0.0–0.5)
Eosinophils Relative: 3 %
HCT: 38.2 % (ref 36.0–46.0)
Hemoglobin: 12.3 g/dL (ref 12.0–15.0)
Immature Granulocytes: 1 %
Lymphocytes Relative: 30 %
Lymphs Abs: 1.9 10*3/uL (ref 0.7–4.0)
MCH: 33.7 pg (ref 26.0–34.0)
MCHC: 32.2 g/dL (ref 30.0–36.0)
MCV: 104.7 fL — ABNORMAL HIGH (ref 80.0–100.0)
Monocytes Absolute: 0.3 10*3/uL (ref 0.1–1.0)
Monocytes Relative: 5 %
Neutro Abs: 3.8 10*3/uL (ref 1.7–7.7)
Neutrophils Relative %: 60 %
Platelets: 451 10*3/uL — ABNORMAL HIGH (ref 150–400)
RBC: 3.65 MIL/uL — ABNORMAL LOW (ref 3.87–5.11)
RDW: 14.4 % (ref 11.5–15.5)
WBC: 6.3 10*3/uL (ref 4.0–10.5)
nRBC: 0 % (ref 0.0–0.2)

## 2019-03-20 LAB — SAMPLE TO BLOOD BANK

## 2019-03-20 NOTE — Telephone Encounter (Signed)
Scheduled appt per 6/2 los.  A calendar will be mailed out 

## 2019-03-21 ENCOUNTER — Inpatient Hospital Stay: Payer: Medicare Other

## 2019-04-04 ENCOUNTER — Other Ambulatory Visit: Payer: Self-pay | Admitting: Nurse Practitioner

## 2019-04-04 MED ORDER — METOPROLOL TARTRATE 25 MG PO TABS: 12.5000 mg | ORAL_TABLET | Freq: Two times a day (BID) | ORAL | 3 refills | Status: DC

## 2019-04-04 NOTE — Telephone Encounter (Signed)
Pt's medication was sent to pt's pharmacy as requested. Confirmation received.  °

## 2019-04-18 ENCOUNTER — Other Ambulatory Visit: Payer: Self-pay | Admitting: Nurse Practitioner

## 2019-04-18 DIAGNOSIS — E7849 Other hyperlipidemia: Secondary | ICD-10-CM

## 2019-04-18 DIAGNOSIS — I482 Chronic atrial fibrillation, unspecified: Secondary | ICD-10-CM

## 2019-04-18 DIAGNOSIS — Z7901 Long term (current) use of anticoagulants: Secondary | ICD-10-CM

## 2019-04-18 DIAGNOSIS — I259 Chronic ischemic heart disease, unspecified: Secondary | ICD-10-CM

## 2019-04-18 NOTE — Telephone Encounter (Signed)
Prescription refill request for Xarelto received.   Last office visit: Alexandra Pierce (02-16-2019) Weight: 83.8 kg (03-20-2019) Age: 73 y.o. Scr: 0.92 (02-05-2019) CrCl: 71 ml/min  Prescription refill sent.

## 2019-05-20 NOTE — Progress Notes (Signed)
HEMATOLOGY/ONCOLOGY CLINIC NOTE  Date of Service: 05/21/2019  Patient Care Team: Chesley Noon, MD as PCP - General (Family Medicine)  CHIEF COMPLAINTS/PURPOSE OF CONSULTATION:  mx of MDS/MPN  HISTORY OF PRESENTING ILLNESS:   Alexandra Pierce is a wonderful 73 y.o. female who has been referred to [place] by Dr. Eliseo Squires for evaluation and management of symptomatic anemia.   Patient has history of hypertension, dyslipidemia, atrial fibrillation on anticoagulation with previous history of CVA x2, obesity who presented with generalized weakness.  Prior to admission she had an episode of blurring of vision especially in the right eye associated with a spell of confusion and possible loss of consciousness for a few seconds associated with disorientation.  She notes that she has also been feeling progressively fatigued for a few weeks and has had difficulty with dyspnea on exertion with short distances. Her daughters insisted she get evaluated and so she came to the emergency room for further evaluation. She does follow-up with cardiology and is being evaluated by pulmonary at the end of the month as well.  Patient previously has been on Coumadin from 2000 but was switched to Xarelto several years ago.  She notes no overt evidence of black stools or bleeding in the stools, no hematuria no nosebleeds no gum bleeds. Notes that she has been generally eating okay. Does endorse new night sweats over the last month or 2. No abdominal pain or significant change in bowel habits.  In the emergency room the patient had labs which showed hemoglobin of 6.6.5 with an MCV of 96.5.  Normal WBC count of 7.2k with elevated platelets of 685k which on repeat were down to 573k. Reticulocyte counts were relatively suppressed. LDH was elevated to 517 but haptoglobin was within normal limits at 45. Coombs negative Myeloma panel showed no M spike Ferritin level was elevated to 845 suggestive of acute phase  reaction/inflammation. B12 and folate within normal limits.  Patient received 2 units of PRBCs with an appropriate bump in her hemoglobin level and felt much better. CT chest abdomen pelvis was done which showed mild splenomegaly without a focal splenic lesion.  Multiple hepatic cysts.  Abnormal hypoenhancement in the right kidney lower pole of undetermined etiology -broad differential based on radiology input. right perirenal stranding is associated with a small amount of fluid in the adjacent right paracolic gutter, and a small fluid collection along the inferior margin of the liver which could be an exophytic cyst, small complex fluid collection, or conceivably a tumor nodule.  No other significant other lymphadenopathy noted.  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 The patient's last visit with [place] was on 03/20/2019. The pt reports that she is doing well overall.  The pt reports no new concerns. She has been eating a healthy diet. She feels when she goes into atrial fibrillation, but it does not happen as often since she retired.   Lab results today (05/21/2019) of CBC w/diff and CMP is as follows: all values are WNL except for RBC at 3.59, MCV at 100.3, MCH at 34.3, PLT at 481k, Total bilirubin at 1.3, GFR at 57.  On review of systems, pt reports eating well and denies any other symptoms.   MEDICAL HISTORY:  Past Medical History:  Diagnosis  Date  . Chronic lower back pain   . Hypercholesteremia   . Hypertension    mild  . Obesity   . Paroxysmal atrial fibrillation (HCC)    managed with anticoagulation and rate control.   . Stroke [facility]) 2001 & 2015   "right side gets weak sometimes if I'm only tired" (03/16/2016)    SURGICAL  HISTORY: Past Surgical History:  Procedure Laterality Date  . CARDIAC CATHETERIZATION  03/31/2000   EF 49%/LAD lg vessel which coursed to the apex & gave rise to 2 diagonal branches/ LAD noted to have 30% ostial lesion & tapers to a sm vessel toward the apex but no high grade lesion/1st diagonal med sized vessel & 2nd diagonal sm vessel with no significant disease/ lt ventriculogram reveals mild global hypokinesis w/ an estimated EF of 45-50%  . CARDIAC CATHETERIZATION N/A 03/17/2016   Procedure: Left Heart Cath and Coronary Angiography;  Surgeon: Wellington Hampshire, MD;  Location: [facility];  Service: Cardiovascular;  Laterality: N/A;  . LEFT HEART CATH AND CORONARY ANGIOGRAPHY N/A 03/31/2018   Procedure: LEFT HEART CATH AND CORONARY ANGIOGRAPHY;  Surgeon: Martinique, Peter M, MD;  Location: [facility];  Service: Cardiovascular;  Laterality: N/A;    SOCIAL HISTORY: Social History   Socioeconomic History  . Marital status: Married    Spouse name: Not on file  . Number of children: 2  . Years of education: Not on file  . Highest education level: Not on file  Occupational History  . Occupation: Herbalist  Social Needs  . Financial resource strain: Not on file  . Food insecurity    Worry: Not on file    Inability: Not on file  . Transportation needs    Medical: Not on file    Non-medical: Not on file  Tobacco Use  . Smoking status: Former Smoker    Packs/day: 0.12    Years: 15.00    Pack years: 1.80    Types: Cigarettes    Quit date: 10/18/1978    Years since quitting: 40.6  . Smokeless tobacco: Never Used  Substance and Sexual Activity  . Alcohol use: Yes    Alcohol/week: 14.0 standard drinks    Types: 14 Glasses of wine per week  . Drug use: No  . Sexual activity: Yes  Lifestyle  . Physical activity    Days per week: Not on file    Minutes per session: Not on file  . Stress: Not on file  Relationships  . Social Herbalist on phone: Not on file     Gets together: Not on file    Attends religious service: Not on file    Active member of club or organization: Not on file    Attends meetings of clubs or organizations: Not on file    Relationship status: Not on file  . Intimate partner violence    Fear of current or ex partner: Not on file    Emotionally abused: Not on file    Physically abused: Not on file    Forced sexual activity: Not on file  Other Topics Concern  . Not on file  Social History Narrative  . Not on file    FAMILY HISTORY: Family History  Problem Relation Age of Onset  . Heart disease Father   . Asthma Father   . Diabetes Father   . Heart disease Mother   . Heart disease Brother   . Hyperlipidemia Sister   .  Hyperlipidemia Brother   . Hyperlipidemia Brother   . Cancer Neg Hx     ALLERGIES:  is allergic to sulfonamide derivatives; lipitor [atorvastatin]; pravachol [pravastatin sodium]; and statins.  MEDICATIONS:  Current Outpatient Medications  Medication Sig Dispense Refill  . acetaminophen (TYLENOL) 500 MG tablet Take 1,000 mg by mouth as needed for mild pain or headache.     . B Complex-C (B-COMPLEX WITH VITAMIN C) tablet Take 1 tablet by mouth daily.    . cholecalciferol (VITAMIN D) 1000 UNITS tablet Take 1,000 Units by mouth daily.    . Cyanocobalamin (B-12) 1000 MCG SUBL Place 1,000 mcg under the tongue daily. 30 each 3  . fenofibrate 160 MG tablet Take 1 tablet (160 mg total) by mouth daily. 90 tablet 3  . loratadine (CLARITIN) 10 MG tablet Take 10 mg by mouth daily.    . metoprolol tartrate (LOPRESSOR) 25 MG tablet Take 0.5 tablets (12.5 mg total) by mouth 2 (two) times daily. 90 tablet 3  . XARELTO 20 MG TABS tablet TAKE 1 TABLET BY MOUTH ONCE DAILY 30 tablet 5   No current facility-administered medications for this visit.     REVIEW OF SYSTEMS:    A 10+ POINT REVIEW OF SYSTEMS WAS OBTAINED including neurology, dermatology, psychiatry, cardiac, respiratory, lymph, extremities, GI, GU,  Musculoskeletal, constitutional, breasts, reproductive, HEENT.  All pertinent positives are noted in the HPI.  All others are negative.   PHYSICAL EXAMINATION: ECOG PERFORMANCE STATUS: 1 - Symptomatic but completely ambulatory  Vitals:   05/21/19 1416  BP: 95/84  Pulse: 92  Resp: 18  Temp: 97.8 F (36.6 C)  SpO2: 98%   Filed Weights   05/21/19 1416  Weight: 184 lb 14.4 oz (83.9 kg)   .Body mass index is 30.77 kg/m.   GENERAL:alert, in no acute distress and comfortable SKIN: no acute rashes, no significant lesions EYES: conjunctiva are pink and non-injected, sclera anicteric OROPHARYNX: MMM, no exudates, no oropharyngeal erythema or ulceration NECK: supple, no JVD LYMPH:  no palpable lymphadenopathy in the cervical, axillary or inguinal regions LUNGS: clear to auscultation b/l with normal respiratory effort HEART: Regular S1S2 ABDOMEN:  normoactive bowel sounds , non tender, not distended. No palpable hepatosplenomegaly.  Extremity: no pedal edema PSYCH: alert & oriented x 3 with fluent speech NEURO: no focal motor/sensory deficits    LABORATORY DATA:  I have reviewed the data as listed  . CBC Latest Ref Rng & Units 05/21/2019 03/20/2019 02/05/2019  WBC 4.0 - 10.5 K/uL 7.2 6.3 4.7  Hemoglobin 12.0 - 15.0 g/dL 12.3 12.3 10.9(L)  Hematocrit 36.0 - 46.0 % 36.0 38.2 31.5(L)  Platelets 150 - 400 K/uL 481(H) 451(H) 356    . CMP Latest Ref Rng & Units 05/21/2019 02/05/2019 01/05/2019  Glucose 70 - 99 mg/dL 99 110(H) 167(H)  BUN 8 - 23 mg/dL 20 12 21   Creatinine 0.44 - 1.00 mg/dL 0.98 0.92 1.26(H)  Sodium 135 - 145 mmol/L 143 143 142  Potassium 3.5 - 5.1 mmol/L 4.0 3.9 3.8  Chloride 98 - 111 mmol/L 106 108 109  CO2 22 - 32 mmol/L 28 25 22   Calcium 8.9 - 10.3 mg/dL 9.2 8.5(L) 8.1(L)  Total Protein 6.5 - 8.1 g/dL 7.2 6.8 6.3(L)  Total Bilirubin 0.3 - 1.2 mg/dL 1.3(H) 1.5(H) 1.3(H)  Alkaline Phos 38 - 126 U/L 57 58 39  AST 15 - 41 U/L 20 26 13(L)  ALT 0 - 44 U/L 20 24 16     05/31/18 Molecular Pathology:   05/24/18  Cytogenetics:     05/24/18 BM Bx:   05/24/18 BM Flow cytometry:     RADIOGRAPHIC STUDIES: I have personally reviewed the radiological images as listed and agreed with the findings in the report. No results found.  ASSESSMENT & PLAN:   73 y.o. female with  1. 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 No overt evidence of bleeding.  In the emergency room the patient had labs which showed hemoglobin of 6.6.5 with an MCV of 96.5.  Normal WBC count of 7.2k with elevated platelets of 685k which on repeat were down to 573k. Reticulocyte counts were relatively suppressed. LDH was elevated to 517 but haptoglobin was within normal limits at 45. Coombs negative Myeloma panel showed no M spike Ferritin level was elevated to 845 suggestive of acute phase reaction/inflammation. B12 and folate within normal limits.  Patient received 2 units of PRBCs with an appropriate bump in her hemoglobin level and felt much better.  05/24/18 BM Bx results which revealed erythroid and megakaryocytic proliferation and ring sideroblasts concerning for myeloproliferative/ myelodysplastic neoplasm   No excess blasts; Acute leukemia and lymphoma are not indicated   2. Rt renal radiographic abnormality as noted in CT with some fluid collection. 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 No fevers chills to suggest acute pyelonephritis or renal abscess. Urine analysis showed trace leuk esterase and 6-10 WBCs no significant hematuria. Broad differential diagnosis based on CT findings as per radiologist. Procalcitonin levels unimpressive  05/23/18 CT Chest Abdomen Pelvis was done which showed mild splenomegaly without a focal splenic lesion. Multiple hepatic cysts.  Abnormal hypoenhancement in the right kidney lower pole of undetermined etiology -broad differential based on radiology input. right  perirenal stranding is associated with a small amount of fluid in the adjacent right paracolic gutter, and a small fluid collection along the inferior margin of the liver which could be an exophytic cyst, small complex fluid collection, or conceivably a tumor nodule. No other significant other lymphadenopathy noted.   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 -Pt was previously requiring blood transfusions about every 1-1.5 months.  Blood warmer to be used considering cold antibodies.  -No indications for transfusions at this time -Pt is taking Xarelto for atrial fibrillation which is protective for her JAK2 mutation related thrombocytosis as well. -Will see the pt back in 2 months with labs   RTC with Dr Irene Limbo with labs in 8 weeks   All of the patients questions were answered with apparent satisfaction. The patient knows to call the clinic with any problems, questions or concerns.  The total time spent in the appt was 15 minutes and more than 50% was on counseling and direct patient cares.  Sullivan Lone MD [place] AAHIVMS [facility] Hematology/Oncology Physician [facility]  (Office):       [phone] (Work cell):  [phone] (Fax):           [phone]  05/21/2019 3:18 PM  I,  De Burrs, am acting as a Education administrator for Dr. Irene Limbo  .I have reviewed the above documentation for accuracy and completeness, and I agree  with the above. Brunetta Genera MD

## 2019-05-21 ENCOUNTER — Inpatient Hospital Stay (HOSPITAL_BASED_OUTPATIENT_CLINIC_OR_DEPARTMENT_OTHER): Payer: Medicare Other | Admitting: Hematology

## 2019-05-21 ENCOUNTER — Inpatient Hospital Stay: Payer: Medicare Other | Attending: Hematology

## 2019-05-21 ENCOUNTER — Other Ambulatory Visit: Payer: Self-pay

## 2019-05-21 VITALS — BP 95/84 | HR 92 | Temp 97.8°F | Resp 18 | Ht 65.0 in | Wt 184.9 lb

## 2019-05-21 DIAGNOSIS — Z7901 Long term (current) use of anticoagulants: Secondary | ICD-10-CM | POA: Diagnosis not present

## 2019-05-21 DIAGNOSIS — I4891 Unspecified atrial fibrillation: Secondary | ICD-10-CM | POA: Insufficient documentation

## 2019-05-21 DIAGNOSIS — D461 Refractory anemia with ring sideroblasts: Secondary | ICD-10-CM | POA: Diagnosis present

## 2019-05-21 DIAGNOSIS — D471 Chronic myeloproliferative disease: Secondary | ICD-10-CM

## 2019-05-21 DIAGNOSIS — Z1589 Genetic susceptibility to other disease: Secondary | ICD-10-CM | POA: Diagnosis not present

## 2019-05-21 LAB — CMP (CANCER CENTER ONLY)
ALT: 20 U/L (ref 0–44)
AST: 20 U/L (ref 15–41)
Albumin: 4.3 g/dL (ref 3.5–5.0)
Alkaline Phosphatase: 57 U/L (ref 38–126)
Anion gap: 9 (ref 5–15)
BUN: 20 mg/dL (ref 8–23)
CO2: 28 mmol/L (ref 22–32)
Calcium: 9.2 mg/dL (ref 8.9–10.3)
Chloride: 106 mmol/L (ref 98–111)
Creatinine: 0.98 mg/dL (ref 0.44–1.00)
GFR, Est AFR Am: >60 mL/min (ref >60)
GFR, Estimated: 57 mL/min — ABNORMAL LOW (ref >60)
Glucose, Bld: 99 mg/dL (ref 70–99)
Potassium: 4 mmol/L (ref 3.5–5.1)
Sodium: 143 mmol/L (ref 135–145)
Total Bilirubin: 1.3 mg/dL — ABNORMAL HIGH (ref 0.3–1.2)
Total Protein: 7.2 g/dL (ref 6.5–8.1)

## 2019-05-21 LAB — CBC WITH DIFFERENTIAL/PLATELET
Abs Immature Granulocytes: 0.03 10*3/uL (ref 0.00–0.07)
Basophils Absolute: 0.1 10*3/uL (ref 0.0–0.1)
Basophils Relative: 1 %
Eosinophils Absolute: 0.1 10*3/uL (ref 0.0–0.5)
Eosinophils Relative: 2 %
HCT: 36 % (ref 36.0–46.0)
Hemoglobin: 12.3 g/dL (ref 12.0–15.0)
Immature Granulocytes: 0 %
Lymphocytes Relative: 29 %
Lymphs Abs: 2.1 10*3/uL (ref 0.7–4.0)
MCH: 34.3 pg — ABNORMAL HIGH (ref 26.0–34.0)
MCHC: 34.2 g/dL (ref 30.0–36.0)
MCV: 100.3 fL — ABNORMAL HIGH (ref 80.0–100.0)
Monocytes Absolute: 0.4 10*3/uL (ref 0.1–1.0)
Monocytes Relative: 6 %
Neutro Abs: 4.5 10*3/uL (ref 1.7–7.7)
Neutrophils Relative %: 62 %
Platelets: 481 10*3/uL — ABNORMAL HIGH (ref 150–400)
RBC: 3.59 MIL/uL — ABNORMAL LOW (ref 3.87–5.11)
RDW: 15 % (ref 11.5–15.5)
WBC: 7.2 10*3/uL (ref 4.0–10.5)
nRBC: 0 % (ref 0.0–0.2)

## 2019-05-21 LAB — LACTATE DEHYDROGENASE: LDH: 264 U/L — ABNORMAL HIGH (ref 98–192)

## 2019-05-21 LAB — SAMPLE TO BLOOD BANK

## 2019-05-22 ENCOUNTER — Telehealth: Payer: Self-pay | Admitting: Hematology

## 2019-05-22 NOTE — Telephone Encounter (Signed)
Scheduled appt per 8/3 los.  Spoke wit patient and she is aware of her appt date and time.

## 2019-06-27 ENCOUNTER — Telehealth: Payer: Self-pay | Admitting: *Deleted

## 2019-06-27 NOTE — Telephone Encounter (Signed)
   [facility] Pre-operative Risk Assessment    Request for surgical clearance:  1. What type of surgery is being performed? 4 TEETH BEING EXTRACTED    2. When is this surgery scheduled? TBD   3. What type of clearance is required (medical clearance vs. Pharmacy clearance to hold med vs. Both)? BOTH  4. Are there any medications that need to be held prior to surgery and how long? [place]   5. Practice name and name of physician performing surgery? Jeralene Peters, D.D.S., MS., P.A.   6. What is your office phone number  [phone]   7.   What is your office fax number [phone]  8.   Anesthesia type (None, local, MAC, general) ? LOCAL   Julaine Hua 06/27/2019, 11:18 AM  _________________________________________________________________   (provider comments below)

## 2019-06-27 NOTE — Telephone Encounter (Signed)
Pt takes Xarelto for afib with CHADS2VASc score of 6 (age, sex, HTN, CAD, CVA). Renal function is normal. Recommend only holding Xarelto for 1 day prior to multiple extractions due to stroke hx.

## 2019-06-29 NOTE — Telephone Encounter (Signed)
   Primary Cardiologist: Truitt Merle, NP  Chart reviewed as part of pre-operative protocol coverage. Patient was contacted 06/29/2019 in reference to pre-operative risk assessment for pending surgery as outlined below.  Alexandra Pierce was last seen on 02/16/2019 by Truitt Merle, NP via a telemedicine visit.  Since that day, Alexandra Pierce has done well from a cardiac standpoint. She can go up a flight or two of stairs, perform light/moderate household chores/ADLs, and walk on level ground without chest pain or SOB. She can easily complete 4 METs without anginal symptoms.  Therefore, based on ACC/AHA guidelines, the patient would be at acceptable risk for the planned procedure without further cardiovascular testing.   Per pharmacy recommendations, patient can hold xarelto 1 day prior to her dental extractions. Xarelto should be restarted as soon as she is cleared to do so by her dentist.  I will route this recommendation to the requesting party via [place] fax function and remove from pre-op pool.  Please call with questions.  Abigail Butts, PA-C 06/29/2019, 9:52 AM

## 2019-07-14 NOTE — Progress Notes (Signed)
HEMATOLOGY/ONCOLOGY CLINIC NOTE  Date of Service: 07/16/2019  Patient Care Team: Chesley Noon, MD as PCP - General (Family Medicine) Burtis Junes, NP as PCP - Cardiology (Nurse Practitioner)  CHIEF COMPLAINTS/PURPOSE OF CONSULTATION:  mx of MDS/MPN  HISTORY OF PRESENTING ILLNESS:   Alexandra Pierce is a wonderful 73 y.o. female who has been referred to [place] by Dr. Eliseo Squires for evaluation and management of symptomatic anemia.   Patient has history of hypertension, dyslipidemia, atrial fibrillation on anticoagulation with previous history of CVA x2, obesity who presented with generalized weakness.  Prior to admission she had an episode of blurring of vision especially in the right eye associated with a spell of confusion and possible loss of consciousness for a few seconds associated with disorientation.  She notes that she has also been feeling progressively fatigued for a few weeks and has had difficulty with dyspnea on exertion with short distances. Her daughters insisted she get evaluated and so she came to the emergency room for further evaluation. She does follow-up with cardiology and is being evaluated by pulmonary at the end of the month as well.  Patient previously has been on Coumadin from 2000 but was switched to Xarelto several years ago.  She notes no overt evidence of black stools or bleeding in the stools, no hematuria no nosebleeds no gum bleeds. Notes that she has been generally eating okay. Does endorse new night sweats over the last month or 2. No abdominal pain or significant change in bowel habits.  In the emergency room the patient had labs which showed hemoglobin of 6.6.5 with an MCV of 96.5.  Normal WBC count of 7.2k with elevated platelets of 685k which on repeat were down to 573k. Reticulocyte counts were relatively suppressed. LDH was elevated to 517 but haptoglobin was within normal limits at 45. Coombs negative Myeloma panel showed no M spike Ferritin  level was elevated to 845 suggestive of acute phase reaction/inflammation. B12 and folate within normal limits.  Patient received 2 units of PRBCs with an appropriate bump in her hemoglobin level and felt much better. CT chest abdomen pelvis was done which showed mild splenomegaly without a focal splenic lesion.  Multiple hepatic cysts.  Abnormal hypoenhancement in the right kidney lower pole of undetermined etiology -broad differential based on radiology input. right perirenal stranding is associated with a small amount of fluid in the adjacent right paracolic gutter, and a small fluid collection along the inferior margin of the liver which could be an exophytic cyst, small complex fluid collection, or conceivably a tumor nodule.  No other significant other lymphadenopathy noted.  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 The patient's last visit with [place] was on 05/21/2019. The pt reports that she is doing well overall.  The pt reports that she is having some abdominal and back pains but nothing that is new or out of the ordinary. She is still taking Xarelto and has no bleeding concerns. Pt states that her energy levels are not 100% but there is no specific or concerning reason for that. She states that she has been regularly taking her Vitamin B12 supplement, as well as her B-complex vitamin.   Lab results today (07/16/19) of CBC w/diff and CMP is as follows: all  values are WNL except for RBC at 2.68, Hgb at 9.2, HCT at 27.5, MCV at 102.6, MCH at 34.3, RDW at 16.1, PLTs at 463K, nRBC at 0.7, Glucose at 108, Calcium at 8.7, Total Bilirubin at 2.3, GFR Est Non Af Am at 55. 05/21/2019 LDH at 380  On review of systems, pt reports fatigue and denies new  abdominal/back pains, bleeding concerns and any other symptoms.     MEDICAL HISTORY:  Past Medical History:  Diagnosis Date  . Chronic lower back pain   . Hypercholesteremia   . Hypertension    mild  . Obesity   . Paroxysmal atrial fibrillation (HCC)    managed with anticoagulation and rate control.   . Stroke [facility]) 2001 & 2015   "right side gets weak sometimes if I'm only tired" (03/16/2016)    SURGICAL HISTORY: Past Surgical History:  Procedure Laterality Date  . CARDIAC CATHETERIZATION  03/31/2000   EF 49%/LAD lg vessel which coursed to the apex & gave rise to 2 diagonal branches/ LAD noted to have 30% ostial lesion & tapers to a sm vessel toward the apex but no high grade lesion/1st diagonal med sized vessel & 2nd diagonal sm vessel with no significant disease/ lt ventriculogram reveals mild global hypokinesis w/ an estimated EF of 45-50%  . CARDIAC CATHETERIZATION N/A 03/17/2016   Procedure: Left Heart Cath and Coronary Angiography;  Surgeon: Wellington Hampshire, MD;  Location: [place] [facility];  Service: Cardiovascular;  Laterality: N/A;  . LEFT HEART CATH AND CORONARY ANGIOGRAPHY N/A 03/31/2018   Procedure: LEFT HEART CATH AND CORONARY ANGIOGRAPHY;  Surgeon: Martinique, Peter M, MD;  Location: [place] [facility];  Service: Cardiovascular;  Laterality: N/A;    SOCIAL HISTORY: Social History   Socioeconomic History  . Marital status: Married    Spouse name: Not on file  . Number of children: 2  . Years of education: Not on file  . Highest education level: Not on file  Occupational History  . Occupation: Herbalist  Social Needs  . Financial resource strain: Not on file  . Food insecurity    Worry: Not on file    Inability: Not on file  . Transportation needs    Medical: Not on file    Non-medical: Not on file  Tobacco Use  . Smoking status: Former Smoker    Packs/day: 0.12    Years: 15.00    Pack years: 1.80    Types: Cigarettes    Quit date: 10/18/1978    Years  since quitting: 40.7  . Smokeless tobacco: Never Used  Substance and Sexual Activity  . Alcohol use: Yes    Alcohol/week: 14.0 standard drinks    Types: 14 Glasses of wine per week  . Drug use: No  . Sexual activity: Yes  Lifestyle  . Physical activity    Days per week: Not on file    Minutes per session: Not on file  . Stress: Not on file  Relationships  . Social Herbalist on phone: Not on file    Gets together: Not on file    Attends religious service: Not on file    Active member of club or organization: Not on file    Attends meetings of clubs or organizations: Not on file    Relationship status: Not on file  . Intimate partner violence    Fear of current or ex partner: Not on file    Emotionally abused: Not on file  Physically abused: Not on file    Forced sexual activity: Not on file  Other Topics Concern  . Not on file  Social History Narrative  . Not on file    FAMILY HISTORY: Family History  Problem Relation Age of Onset  . Heart disease Father   . Asthma Father   . Diabetes Father   . Heart disease Mother   . Heart disease Brother   . Hyperlipidemia Sister   . Hyperlipidemia Brother   . Hyperlipidemia Brother   . Cancer Neg Hx     ALLERGIES:  is allergic to sulfonamide derivatives; lipitor [atorvastatin]; pravachol [pravastatin sodium]; and statins.  MEDICATIONS:  Current Outpatient Medications  Medication Sig Dispense Refill  . acetaminophen (TYLENOL) 500 MG tablet Take 1,000 mg by mouth as needed for mild pain or headache.     . B Complex-C (B-COMPLEX WITH VITAMIN C) tablet Take 1 tablet by mouth daily.    . cholecalciferol (VITAMIN D) 1000 UNITS tablet Take 1,000 Units by mouth daily.    . Cyanocobalamin (B-12) 1000 MCG SUBL Place 1,000 mcg under the tongue daily. 30 each 3  . fenofibrate 160 MG tablet Take 1 tablet (160 mg total) by mouth daily. 90 tablet 3  . loratadine (CLARITIN) 10 MG tablet Take 10 mg by mouth daily.    .  metoprolol tartrate (LOPRESSOR) 25 MG tablet Take 0.5 tablets (12.5 mg total) by mouth 2 (two) times daily. 90 tablet 3  . XARELTO 20 MG TABS tablet TAKE 1 TABLET BY MOUTH ONCE DAILY 30 tablet 5   No current facility-administered medications for this visit.     REVIEW OF SYSTEMS:    A 10+ POINT REVIEW OF SYSTEMS WAS OBTAINED including neurology, dermatology, psychiatry, cardiac, respiratory, lymph, extremities, GI, GU, Musculoskeletal, constitutional, breasts, reproductive, HEENT.  All pertinent positives are noted in the HPI.  All others are negative.   PHYSICAL EXAMINATION: ECOG PERFORMANCE STATUS: 1 - Symptomatic but completely ambulatory  Vitals:   07/16/19 1151  BP: 115/70  Pulse: (!) 104  Resp: 18  Temp: 98.3 F (36.8 C)  SpO2: 100%   Filed Weights   07/16/19 1151  Weight: 181 lb 11.2 oz (82.4 kg)   .Body mass index is 30.24 kg/m.    GENERAL:alert, in no acute distress and comfortable SKIN: no acute rashes, no significant lesions EYES: conjunctiva are pink and non-injected, sclera anicteric OROPHARYNX: MMM, no exudates, no oropharyngeal erythema or ulceration NECK: supple, no JVD LYMPH:  no palpable lymphadenopathy in the cervical, axillary or inguinal regions LUNGS: clear to auscultation b/l with normal respiratory effort HEART: regular rate & rhythm  ABDOMEN:  normoactive bowel sounds , non tender, not distended. No palpable hepatosplenomegaly.  Extremity: no pedal edema PSYCH: alert & oriented x 3 with fluent speech NEURO: no focal motor/sensory deficits   LABORATORY DATA:  I have reviewed the data as listed  . CBC Latest Ref Rng & Units 07/16/2019 05/21/2019 03/20/2019  WBC 4.0 - 10.5 K/uL 5.6 7.2 6.3  Hemoglobin 12.0 - 15.0 g/dL 9.2(L) 12.3 12.3  Hematocrit 36.0 - 46.0 % 27.5(L) 36.0 38.2  Platelets 150 - 400 K/uL 463(H) 481(H) 451(H)    . CMP Latest Ref Rng & Units 07/16/2019 05/21/2019 02/05/2019  Glucose 70 - 99 mg/dL 108(H) 99 110(H)  BUN 8 - 23 mg/dL  15 20 12   Creatinine 0.44 - 1.00 mg/dL 1.00 0.98 0.92  Sodium 135 - 145 mmol/L 140 143 143  Potassium 3.5 - 5.1 mmol/L 3.9 4.0  3.9  Chloride 98 - 111 mmol/L 106 106 108  CO2 22 - 32 mmol/L 27 28 25   Calcium 8.9 - 10.3 mg/dL 8.7(L) 9.2 8.5(L)  Total Protein 6.5 - 8.1 g/dL 6.8 7.2 6.8  Total Bilirubin 0.3 - 1.2 mg/dL 2.3(H) 1.3(H) 1.5(H)  Alkaline Phos 38 - 126 U/L 44 57 58  AST 15 - 41 U/L 27 20 26   ALT 0 - 44 U/L 19 20 24    05/31/18 Molecular Pathology:   05/24/18 Cytogenetics:     05/24/18 BM Bx:   05/24/18 BM Flow cytometry:     RADIOGRAPHIC STUDIES: I have personally reviewed the radiological images as listed and agreed with the findings in the report. No results found.  ASSESSMENT & PLAN:   73 y.o. female with  1. 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 No overt evidence of bleeding.  In the emergency room the patient had labs which showed hemoglobin of 6.6.5 with an MCV of 96.5.  Normal WBC count of 7.2k with elevated platelets of 685k which on repeat were down to 573k. Reticulocyte counts were relatively suppressed. LDH was elevated to 517 but haptoglobin was within normal limits at 45. Coombs negative Myeloma panel showed no M spike Ferritin level was elevated to 845 suggestive of acute phase reaction/inflammation. B12 and folate within normal limits.  Patient received 2 units of PRBCs with an appropriate bump in her hemoglobin level and felt much better.  05/24/18 BM Bx results which revealed erythroid and megakaryocytic proliferation and ring sideroblasts concerning for myeloproliferative/ myelodysplastic neoplasm   No excess blasts; Acute leukemia and lymphoma are not indicated   2. Rt renal radiographic abnormality as noted in CT with some fluid collection. 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 No fevers chills to suggest acute pyelonephritis or renal abscess. Urine  analysis showed trace leuk esterase and 6-10 WBCs no significant hematuria. Broad differential diagnosis based on CT findings as per radiologist. Procalcitonin levels unimpressive  05/23/18 CT Chest Abdomen Pelvis was done which showed mild splenomegaly without a focal splenic lesion. Multiple hepatic cysts.  Abnormal hypoenhancement in the right kidney lower pole of undetermined etiology -broad differential based on radiology input. right perirenal stranding is associated with a small amount of fluid in the adjacent right paracolic gutter, and a small fluid collection along the inferior margin of the liver which could be an exophytic cyst, small complex fluid collection, or conceivably a tumor nodule. No other significant other lymphadenopathy noted.   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 -Discussed 05/21/2019 LDH at 380 -Pt was previously requiring blood transfusions about every 1-1.5 months. Blood warmer to be used considering cold antibodies.  -No indications for transfusions at this time -Pt is taking Xarelto for atrial fibrillation which is protective for her JAK2 mutation related thrombocytosis as well. -Advised pt to continue Vitamin B12 and B-complex vitamin  -Discussed that increased cold agglutinin activitiy could be increasing hemolysis  -Will not put pt on Hydroxyurea at this time due to declining RBC  -Will get additional labs today -Will see the pt  back in 1 month with labs    FOLLOW UP: Additional Labs today  RTC with labs in 1 month  The total time spent in the appt was 25 minutes and more than 50% was on counseling  and direct patient cares.  All of the patient's questions were answered with apparent satisfaction. The patient knows to call the clinic with any problems, questions or concerns.   Sullivan Lone MD [place] AAHIVMS [facility] Hematology/Oncology Physician [facility]  (Office):       [phone] (Work cell):  [phone] (Fax):           [phone]  07/16/2019 12:48 PM  I, Yevette Edwards, am acting as a scribe for Dr. Sullivan Lone.   .I have reviewed the above documentation for accuracy and completeness, and I agree with the above. Brunetta Genera MD   ADDENDUM    Component     Latest Ref Rng & Units 07/16/2019  Folate, Hemolysate     Not Estab. ng/mL 452.0  HCT     34.0 - 46.6 % 27.1 (L)  Folate, RBC     >498 ng/mL 1,668  DAT, complement      POS  DAT, IgG      POS . . .  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  Brunetta Genera MD

## 2019-07-16 ENCOUNTER — Inpatient Hospital Stay: Payer: Medicare Other | Admitting: Hematology

## 2019-07-16 ENCOUNTER — Inpatient Hospital Stay: Payer: Medicare Other | Attending: Hematology

## 2019-07-16 ENCOUNTER — Inpatient Hospital Stay: Payer: Medicare Other

## 2019-07-16 ENCOUNTER — Other Ambulatory Visit: Payer: Self-pay

## 2019-07-16 ENCOUNTER — Telehealth: Payer: Self-pay

## 2019-07-16 VITALS — BP 115/70 | HR 104 | Temp 98.3°F | Resp 18 | Ht 65.0 in | Wt 181.7 lb

## 2019-07-16 DIAGNOSIS — D461 Refractory anemia with ring sideroblasts: Secondary | ICD-10-CM

## 2019-07-16 DIAGNOSIS — D471 Chronic myeloproliferative disease: Secondary | ICD-10-CM

## 2019-07-16 DIAGNOSIS — D591 Other autoimmune hemolytic anemias: Secondary | ICD-10-CM

## 2019-07-16 DIAGNOSIS — E785 Hyperlipidemia, unspecified: Secondary | ICD-10-CM | POA: Diagnosis not present

## 2019-07-16 DIAGNOSIS — D649 Anemia, unspecified: Secondary | ICD-10-CM

## 2019-07-16 DIAGNOSIS — Z7901 Long term (current) use of anticoagulants: Secondary | ICD-10-CM | POA: Insufficient documentation

## 2019-07-16 DIAGNOSIS — Z1589 Genetic susceptibility to other disease: Secondary | ICD-10-CM

## 2019-07-16 DIAGNOSIS — I10 Essential (primary) hypertension: Secondary | ICD-10-CM | POA: Insufficient documentation

## 2019-07-16 DIAGNOSIS — I4891 Unspecified atrial fibrillation: Secondary | ICD-10-CM | POA: Insufficient documentation

## 2019-07-16 DIAGNOSIS — D5911 Warm autoimmune hemolytic anemia: Secondary | ICD-10-CM

## 2019-07-16 LAB — CMP (CANCER CENTER ONLY)
ALT: 19 U/L (ref 0–44)
AST: 27 U/L (ref 15–41)
Albumin: 4.3 g/dL (ref 3.5–5.0)
Alkaline Phosphatase: 44 U/L (ref 38–126)
Anion gap: 7 (ref 5–15)
BUN: 15 mg/dL (ref 8–23)
CO2: 27 mmol/L (ref 22–32)
Calcium: 8.7 mg/dL — ABNORMAL LOW (ref 8.9–10.3)
Chloride: 106 mmol/L (ref 98–111)
Creatinine: 1 mg/dL (ref 0.44–1.00)
GFR, Est AFR Am: >60 mL/min (ref >60)
GFR, Estimated: 55 mL/min — ABNORMAL LOW (ref >60)
Glucose, Bld: 108 mg/dL — ABNORMAL HIGH (ref 70–99)
Potassium: 3.9 mmol/L (ref 3.5–5.1)
Sodium: 140 mmol/L (ref 135–145)
Total Bilirubin: 2.3 mg/dL — ABNORMAL HIGH (ref 0.3–1.2)
Total Protein: 6.8 g/dL (ref 6.5–8.1)

## 2019-07-16 LAB — CBC WITH DIFFERENTIAL/PLATELET
Abs Immature Granulocytes: 0.04 10*3/uL (ref 0.00–0.07)
Basophils Absolute: 0.1 10*3/uL (ref 0.0–0.1)
Basophils Relative: 1 %
Eosinophils Absolute: 0.1 10*3/uL (ref 0.0–0.5)
Eosinophils Relative: 3 %
HCT: 27.5 % — ABNORMAL LOW (ref 36.0–46.0)
Hemoglobin: 9.2 g/dL — ABNORMAL LOW (ref 12.0–15.0)
Immature Granulocytes: 1 %
Lymphocytes Relative: 31 %
Lymphs Abs: 1.7 10*3/uL (ref 0.7–4.0)
MCH: 34.3 pg — ABNORMAL HIGH (ref 26.0–34.0)
MCHC: 33.5 g/dL (ref 30.0–36.0)
MCV: 102.6 fL — ABNORMAL HIGH (ref 80.0–100.0)
Monocytes Absolute: 0.2 10*3/uL (ref 0.1–1.0)
Monocytes Relative: 4 %
Neutro Abs: 3.4 10*3/uL (ref 1.7–7.7)
Neutrophils Relative %: 60 %
Platelets: 463 10*3/uL — ABNORMAL HIGH (ref 150–400)
RBC: 2.68 MIL/uL — ABNORMAL LOW (ref 3.87–5.11)
RDW: 16.1 % — ABNORMAL HIGH (ref 11.5–15.5)
WBC: 5.6 10*3/uL (ref 4.0–10.5)
nRBC: 0.7 % — ABNORMAL HIGH (ref 0.0–0.2)

## 2019-07-16 LAB — SAMPLE TO BLOOD BANK

## 2019-07-16 LAB — VITAMIN B12: Vitamin B-12: 609 pg/mL (ref 180–914)

## 2019-07-16 LAB — LACTATE DEHYDROGENASE: LDH: 380 U/L — ABNORMAL HIGH (ref 98–192)

## 2019-07-16 LAB — DIRECT ANTIGLOBULIN TEST (NOT AT ARMC)
DAT, IgG: POSITIVE
DAT, complement: POSITIVE

## 2019-07-16 LAB — FERRITIN: Ferritin: 746 ng/mL — ABNORMAL HIGH (ref 11–307)

## 2019-07-16 NOTE — Telephone Encounter (Signed)
Received a call from the lab stating that the cold agglutinin test ordered by Dr. Irene Limbo was not drawn. Dr. Irene Limbo made aware and stated he will review other lab results then determine if patient needs to return for missed test.

## 2019-07-17 LAB — HAPTOGLOBIN: Haptoglobin: <10 mg/dL — ABNORMAL LOW (ref 42–346)

## 2019-07-17 LAB — FOLATE RBC
Folate, Hemolysate: 452 ng/mL
Folate, RBC: 1668 ng/mL (ref >498)
Hematocrit: 27.1 % — ABNORMAL LOW (ref 34.0–46.6)

## 2019-07-17 LAB — HEMATOLOGY COMMENTS:

## 2019-07-22 MED ORDER — PREDNISONE 20 MG PO TABS: 60.0000 mg | ORAL_TABLET | Freq: Every day | ORAL | 1 refills | Status: DC

## 2019-07-22 MED ORDER — FOLIC ACID 1 MG PO TABS: 2.0000 mg | ORAL_TABLET | Freq: Every day | ORAL | 22 refills | Status: DC

## 2019-08-02 ENCOUNTER — Other Ambulatory Visit: Payer: Self-pay | Admitting: *Deleted

## 2019-08-02 ENCOUNTER — Telehealth: Payer: Self-pay | Admitting: *Deleted

## 2019-08-02 DIAGNOSIS — D5911 Warm autoimmune hemolytic anemia: Secondary | ICD-10-CM

## 2019-08-02 DIAGNOSIS — D461 Refractory anemia with ring sideroblasts: Secondary | ICD-10-CM

## 2019-08-02 NOTE — Progress Notes (Signed)
HEMATOLOGY/ONCOLOGY CLINIC NOTE  Date of Service: 08/03/2019  Patient Care Team: Chesley Noon, MD as PCP - General (Family Medicine) Burtis Junes, NP as PCP - Cardiology (Nurse Practitioner)  CHIEF COMPLAINTS/PURPOSE OF CONSULTATION:  mx of MDS/MPN  HISTORY OF PRESENTING ILLNESS:   Alexandra Pierce is a wonderful 73 y.o. female who has been referred to [place] by Dr. Eliseo Squires for evaluation and management of symptomatic anemia.   Patient has history of hypertension, dyslipidemia, atrial fibrillation on anticoagulation with previous history of CVA x2, obesity who presented with generalized weakness.  Prior to admission she had an episode of blurring of vision especially in the right eye associated with a spell of confusion and possible loss of consciousness for a few seconds associated with disorientation.  She notes that she has also been feeling progressively fatigued for a few weeks and has had difficulty with dyspnea on exertion with short distances. Her daughters insisted she get evaluated and so she came to the emergency room for further evaluation. She does follow-up with cardiology and is being evaluated by pulmonary at the end of the month as well.  Patient previously has been on Coumadin from 2000 but was switched to Xarelto several years ago.  She notes no overt evidence of black stools or bleeding in the stools, no hematuria no nosebleeds no gum bleeds. Notes that she has been generally eating okay. Does endorse new night sweats over the last month or 2. No abdominal pain or significant change in bowel habits.  In the emergency room the patient had labs which showed hemoglobin of 6.6.5 with an MCV of 96.5.  Normal WBC count of 7.2k with elevated platelets of 685k which on repeat were down to 573k. Reticulocyte counts were relatively suppressed. LDH was elevated to 517 but haptoglobin was within normal limits at 45. Coombs negative Myeloma panel showed no M spike Ferritin  level was elevated to 845 suggestive of acute phase reaction/inflammation. B12 and folate within normal limits.  Patient received 2 units of PRBCs with an appropriate bump in her hemoglobin level and felt much better. CT chest abdomen pelvis was done which showed mild splenomegaly without a focal splenic lesion.  Multiple hepatic cysts.  Abnormal hypoenhancement in the right kidney lower pole of undetermined etiology -broad differential based on radiology input. right perirenal stranding is associated with a small amount of fluid in the adjacent right paracolic gutter, and a small fluid collection along the inferior margin of the liver which could be an exophytic cyst, small complex fluid collection, or conceivably a tumor nodule.  No other significant other lymphadenopathy noted.  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 The patient's last visit with [place] was on 07/16/2019. The pt reports that she is doing well overall.  The pt reports that she does not feel good. She states that she is experiencing fatigue and heartburn, she has a cough,and her limbs feel weak. She has also noticed that her eyes are yellow.  Lab results today (08/03/19) of CBC w/diff and CMP is as follows: all values are WNL except for RBC at 2.55, Hemoglobin  At 8.8, HCT at 26.3, MCV at 103.1, MCH at 34.5, RDW at 17.2, Platelet Count at 432, nRBC at 1.7, and Abs Immature Granulocytes at 0.10 .  CMP: Glucose Bld at 114, BUN at 28, Cretinine at 1.28, Calcium at 8.5, AST at 165, ALT at 335, Total Bilirubin at 5.1, GFR, Est Non Af Am at 41, GFR, Est AFR Am at 48   LDH: at 443 haptoglobin<10    On review of systems, pt reports fatigue, limbs feel weak, cough, heartburn, eyes are yellow and denies  abdominal pain and any other symptoms.    MEDICAL HISTORY:  Past Medical History:  Diagnosis Date  . Chronic lower back pain   . Hypercholesteremia   . Hypertension    mild  . Obesity   . Paroxysmal atrial fibrillation (HCC)    managed with anticoagulation and rate control.   . Stroke [facility]) 2001 & 2015   "right side gets weak sometimes if I'm only tired" (03/16/2016)    SURGICAL HISTORY: Past Surgical History:  Procedure Laterality Date  . CARDIAC CATHETERIZATION  03/31/2000   EF 49%/LAD lg vessel which coursed to the apex & gave rise to 2 diagonal branches/ LAD noted to have 30% ostial lesion & tapers to a sm vessel toward the apex but no high grade lesion/1st diagonal med sized vessel & 2nd diagonal sm vessel with no significant disease/ lt ventriculogram reveals mild global hypokinesis w/ an estimated EF of 45-50%  . CARDIAC CATHETERIZATION N/A 03/17/2016   Procedure: Left Heart Cath and Coronary Angiography;  Surgeon: Wellington Hampshire, MD;  Location: [facility];  Service: Cardiovascular;  Laterality: N/A;  . LEFT HEART CATH AND CORONARY ANGIOGRAPHY N/A 03/31/2018   Procedure: LEFT HEART CATH AND CORONARY ANGIOGRAPHY;  Surgeon: Martinique, Peter M, MD;  Location: [facility];  Service: Cardiovascular;  Laterality: N/A;    SOCIAL HISTORY: Social History   Socioeconomic History  . Marital status: Married    Spouse name: Not on file  . Number of children: 2  . Years of education: Not on file  . Highest education level: Not on file  Occupational History  . Occupation: Herbalist  Social Needs  . Financial resource strain: Not on file  . Food insecurity    Worry: Not on file    Inability: Not on file  . Transportation needs    Medical: Not on file    Non-medical: Not on file  Tobacco Use  . Smoking status: Former Smoker    Packs/day: 0.12    Years: 15.00    Pack years: 1.80    Types: Cigarettes    Quit date: 10/18/1978    Years since quitting: 40.8  .  Smokeless tobacco: Never Used  Substance and Sexual Activity  . Alcohol use: Yes    Alcohol/week: 14.0 standard drinks    Types: 14 Glasses of wine per week  . Drug use: No  . Sexual activity: Yes  Lifestyle  . Physical activity    Days per week: Not on file    Minutes per session: Not on file  . Stress: Not on file  Relationships  . Social Herbalist on phone: Not on file    Gets together: Not on file    Attends religious service: Not on file    Active member of club or organization: Not on file    Attends meetings of clubs or organizations: Not on file    Relationship status: Not on file  . Intimate partner violence    Fear of current or ex partner: Not on file    Emotionally abused: Not on file  Physically abused: Not on file    Forced sexual activity: Not on file  Other Topics Concern  . Not on file  Social History Narrative  . Not on file    FAMILY HISTORY: Family History  Problem Relation Age of Onset  . Heart disease Father   . Asthma Father   . Diabetes Father   . Heart disease Mother   . Heart disease Brother   . Hyperlipidemia Sister   . Hyperlipidemia Brother   . Hyperlipidemia Brother   . Cancer Neg Hx     ALLERGIES:  is allergic to sulfonamide derivatives; lipitor [atorvastatin]; pravachol [pravastatin sodium]; and statins.  MEDICATIONS:  Current Outpatient Medications  Medication Sig Dispense Refill  . acetaminophen (TYLENOL) 500 MG tablet Take 1,000 mg by mouth as needed for mild pain or headache.     . B Complex-C (B-COMPLEX WITH VITAMIN C) tablet Take 1 tablet by mouth daily.    . cholecalciferol (VITAMIN D) 1000 UNITS tablet Take 1,000 Units by mouth daily.    . Cyanocobalamin (B-12) 1000 MCG SUBL Place 1,000 mcg under the tongue daily. 30 each 3  . fenofibrate 160 MG tablet Take 1 tablet (160 mg total) by mouth daily. 90 tablet 3  . folic acid (FOLVITE) 1 MG tablet Take 2 tablets (2 mg total) by mouth daily. 60 tablet 22  .  loratadine (CLARITIN) 10 MG tablet Take 10 mg by mouth daily.    . metoprolol tartrate (LOPRESSOR) 25 MG tablet Take 0.5 tablets (12.5 mg total) by mouth 2 (two) times daily. 90 tablet 3  . predniSONE (DELTASONE) 20 MG tablet Take 3 tablets (60 mg total) by mouth daily with breakfast. 60 tablet 1  . XARELTO 20 MG TABS tablet TAKE 1 TABLET BY MOUTH ONCE DAILY 30 tablet 5   No current facility-administered medications for this visit.     REVIEW OF SYSTEMS:    A 10+ POINT REVIEW OF SYSTEMS WAS OBTAINED including neurology, dermatology, psychiatry, cardiac, respiratory, lymph, extremities, GI, GU, Musculoskeletal, constitutional, breasts, reproductive, HEENT.  All pertinent positives are noted in the HPI.  All others are negative.    PHYSICAL EXAMINATION: ECOG FS:1 - Symptomatic but completely ambulatory  There were no vitals filed for this visit. Wt Readings from Last 3 Encounters:  07/16/19 181 lb 11.2 oz (82.4 kg)  05/21/19 184 lb 14.4 oz (83.9 kg)  03/20/19 184 lb 12.8 oz (83.8 kg)   There is no height or weight on file to calculate BMI.    GENERAL:alert, in no acute distress and comfortable SKIN: no acute rashes, no significant lesions EYES: conjunctiva are pink and non-injected, sclera anicteric OROPHARYNX: MMM, no exudates, no oropharyngeal erythema or ulceration NECK: supple, no JVD LYMPH:  no palpable lymphadenopathy in the cervical, axillary or inguinal regions LUNGS: clear to auscultation b/l with normal respiratory effort HEART: regular rate & rhythm ABDOMEN:  normoactive bowel sounds , non tender, not distended. Extremity: no pedal edema PSYCH: alert & oriented x 3 with fluent speech NEURO: no focal motor/sensory deficits    LABORATORY DATA:  I have reviewed the data as listed  . CBC Latest Ref Rng & Units 07/16/2019 07/16/2019 05/21/2019  WBC 4.0 - 10.5 K/uL 5.6 - 7.2  Hemoglobin 12.0 - 15.0 g/dL 9.2(L) - 12.3  Hematocrit 34.0 - 46.6 % 27.1(L) 27.5(L) 36.0   Platelets 150 - 400 K/uL 463(H) - 481(H)    . CMP Latest Ref Rng & Units 07/16/2019 05/21/2019 02/05/2019  Glucose 70 - 99  mg/dL 108(H) 99 110(H)  BUN 8 - 23 mg/dL 15 20 12   Creatinine 0.44 - 1.00 mg/dL 1.00 0.98 0.92  Sodium 135 - 145 mmol/L 140 143 143  Potassium 3.5 - 5.1 mmol/L 3.9 4.0 3.9  Chloride 98 - 111 mmol/L 106 106 108  CO2 22 - 32 mmol/L 27 28 25   Calcium 8.9 - 10.3 mg/dL 8.7(L) 9.2 8.5(L)  Total Protein 6.5 - 8.1 g/dL 6.8 7.2 6.8  Total Bilirubin 0.3 - 1.2 mg/dL 2.3(H) 1.3(H) 1.5(H)  Alkaline Phos 38 - 126 U/L 44 57 58  AST 15 - 41 U/L 27 20 26   ALT 0 - 44 U/L 19 20 24    05/31/18 Molecular Pathology:   05/24/18 Cytogenetics:     05/24/18 BM Bx:   05/24/18 BM Flow cytometry:     RADIOGRAPHIC STUDIES: I have personally reviewed the radiological images as listed and agreed with the findings in the report. No results found.  ASSESSMENT & PLAN:   73 y.o. female with  1. 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 No overt evidence of bleeding.  In the emergency room the patient had labs which showed hemoglobin of 6.6.5 with an MCV of 96.5.  Normal WBC count of 7.2k with elevated platelets of 685k which on repeat were down to 573k. Reticulocyte counts were relatively suppressed. LDH was elevated to 517 but haptoglobin was within normal limits at 45. Coombs negative Myeloma panel showed no M spike Ferritin level was elevated to 845 suggestive of acute phase reaction/inflammation. B12 and folate within normal limits.  Patient received 2 units of PRBCs with an appropriate bump in her hemoglobin level and felt much better.  05/24/18 BM Bx results which revealed erythroid and megakaryocytic proliferation and ring sideroblasts concerning for myeloproliferative/ myelodysplastic neoplasm   No excess blasts; Acute leukemia and lymphoma are not indicated   2. Rt renal radiographic abnormality as noted in CT with some  fluid collection. 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 No fevers chills to suggest acute pyelonephritis or renal abscess. Urine analysis showed trace leuk esterase and 6-10 WBCs no significant hematuria. Broad differential diagnosis based on CT findings as per radiologist. Procalcitonin levels unimpressive  05/23/18 CT Chest Abdomen Pelvis was done which showed mild splenomegaly without a focal splenic lesion. Multiple hepatic cysts.  Abnormal hypoenhancement in the right kidney lower pole of undetermined etiology -broad differential based on radiology input. right perirenal stranding is associated with a small amount of fluid in the adjacent right paracolic gutter, and a small fluid collection along the inferior margin of the liver which could be an exophytic cyst, small complex fluid collection, or conceivably a tumor nodule. No other significant other lymphadenopathy noted.   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 Warm Antibody hemolytic Anemia PLAN: -Discussed pt labwork today, 08/03/19; CBC w/diff and CMP is as follows: all values are WNL except for RBC at 2.55, Hemoglobin  At 8.8, HCT at 26.3, MCV at 103.1, MCH at 34.5, RDW at 17.2, Platelet Count at 432, nRBC at 1.7, and Abs Immature Granulocytes at 0.10 .   -Discussed that hemoglobin is 8.8 and that she is producing antibodies as well as breaking down her red blood cells. -Discussed starting steroids to try to reduce red cell hemolysis Pt advised that she is already on prednisone 60MG  a day. Advised that she should continue taking prednisone  and follow up in a week to see if blood counts are improving. If there is no improvement increase prednisone to 80MG  a day.   -Advised that a transfusion isn't necessary at this time.  -Will prescribe acid reducer because steroids can cause acid reflux  -If not  responding in 2-4 weeks would need to consider rituxan -Advised pt to continue taking  folic acid but once she has taken all of the folic acid taking only the  B complex will suffice. -advised that a change in the immune system, age,viral infection, the body is reacting to cancer, or for no reason. In most cases the cause is unknown.  -Will do labs once a week for the rest of the month to continue to watch red blood cells      FOLLOW UP: Weekly labs x 4 MD visit in 2 weeks with Dr Irene Limbo   The total time spent in the appt was 6minutes and more than 50% was on counseling and direct patient cares.  All of the patient's questions were answered with apparent satisfaction. The patient knows to call the clinic with any problems, questions or concerns.   Sullivan Lone MD [place] AAHIVMS [facility] Hematology/Oncology Physician [facility]  (Office):       [phone] (Work cell):  [phone] (Fax):           [phone]  I, Scot Dock, am acting as a scribe for Dr. Sullivan Lone.   .I have reviewed the above documentation for accuracy and completeness, and I agree with the above. Brunetta Genera MD

## 2019-08-02 NOTE — Telephone Encounter (Signed)
Contacted patient pre Dr. Irene Limbo - Patient called and spoke with Triage nurse. Feels weak and that she might need blood. She stated it started a few days ago,

## 2019-08-02 NOTE — Telephone Encounter (Signed)
She feels tired and weak like she always does. Dr. Irene Limbo given information - patient offered appt 10/16. Patient contacted and is in agreement come to appt tomorrow.  No temp. No travel in past 2 weeks. No exposure to anyone who has tested positive for covid 19.

## 2019-08-02 NOTE — Telephone Encounter (Addendum)
"  Alexandra Pierce [phone]).  Feel I need blood work, like I need blood.   Coughing. Not coughing anything up.  Tired and weak.  Symptoms started a couple days ago.  Temperature this morning was fine. No pain anywhere."

## 2019-08-03 ENCOUNTER — Inpatient Hospital Stay: Payer: Medicare Other

## 2019-08-03 ENCOUNTER — Inpatient Hospital Stay: Payer: Medicare Other | Attending: Hematology | Admitting: Hematology

## 2019-08-03 ENCOUNTER — Other Ambulatory Visit: Payer: Self-pay

## 2019-08-03 ENCOUNTER — Telehealth: Payer: Self-pay | Admitting: *Deleted

## 2019-08-03 VITALS — BP 108/74 | HR 89 | Temp 98.0°F | Resp 18 | Ht 65.0 in | Wt 181.7 lb

## 2019-08-03 DIAGNOSIS — R05 Cough: Secondary | ICD-10-CM | POA: Insufficient documentation

## 2019-08-03 DIAGNOSIS — R531 Weakness: Secondary | ICD-10-CM | POA: Insufficient documentation

## 2019-08-03 DIAGNOSIS — R5383 Other fatigue: Secondary | ICD-10-CM | POA: Diagnosis not present

## 2019-08-03 DIAGNOSIS — R12 Heartburn: Secondary | ICD-10-CM | POA: Insufficient documentation

## 2019-08-03 DIAGNOSIS — Z8673 Personal history of transient ischemic attack (TIA), and cerebral infarction without residual deficits: Secondary | ICD-10-CM | POA: Insufficient documentation

## 2019-08-03 DIAGNOSIS — I10 Essential (primary) hypertension: Secondary | ICD-10-CM | POA: Diagnosis not present

## 2019-08-03 DIAGNOSIS — Z79899 Other long term (current) drug therapy: Secondary | ICD-10-CM | POA: Insufficient documentation

## 2019-08-03 DIAGNOSIS — E669 Obesity, unspecified: Secondary | ICD-10-CM | POA: Insufficient documentation

## 2019-08-03 DIAGNOSIS — D5911 Warm autoimmune hemolytic anemia: Secondary | ICD-10-CM

## 2019-08-03 DIAGNOSIS — I480 Paroxysmal atrial fibrillation: Secondary | ICD-10-CM | POA: Insufficient documentation

## 2019-08-03 DIAGNOSIS — D461 Refractory anemia with ring sideroblasts: Secondary | ICD-10-CM

## 2019-08-03 DIAGNOSIS — R7989 Other specified abnormal findings of blood chemistry: Secondary | ICD-10-CM | POA: Insufficient documentation

## 2019-08-03 LAB — CMP (CANCER CENTER ONLY)
ALT: 335 U/L (ref 0–44)
AST: 165 U/L — ABNORMAL HIGH (ref 15–41)
Albumin: 3.9 g/dL (ref 3.5–5.0)
Alkaline Phosphatase: 85 U/L (ref 38–126)
Anion gap: 14 (ref 5–15)
BUN: 28 mg/dL — ABNORMAL HIGH (ref 8–23)
CO2: 26 mmol/L (ref 22–32)
Calcium: 8.5 mg/dL — ABNORMAL LOW (ref 8.9–10.3)
Chloride: 104 mmol/L (ref 98–111)
Creatinine: 1.28 mg/dL — ABNORMAL HIGH (ref 0.44–1.00)
GFR, Est AFR Am: 48 mL/min — ABNORMAL LOW (ref >60)
GFR, Estimated: 41 mL/min — ABNORMAL LOW (ref >60)
Glucose, Bld: 114 mg/dL — ABNORMAL HIGH (ref 70–99)
Potassium: 3.5 mmol/L (ref 3.5–5.1)
Sodium: 144 mmol/L (ref 135–145)
Total Bilirubin: 5.1 mg/dL (ref 0.3–1.2)
Total Protein: 6.6 g/dL (ref 6.5–8.1)

## 2019-08-03 LAB — CBC WITH DIFFERENTIAL (CANCER CENTER ONLY)
Abs Immature Granulocytes: 0.1 10*3/uL — ABNORMAL HIGH (ref 0.00–0.07)
Basophils Absolute: 0 10*3/uL (ref 0.0–0.1)
Basophils Relative: 1 %
Eosinophils Absolute: 0.1 10*3/uL (ref 0.0–0.5)
Eosinophils Relative: 1 %
HCT: 26.3 % — ABNORMAL LOW (ref 36.0–46.0)
Hemoglobin: 8.8 g/dL — ABNORMAL LOW (ref 12.0–15.0)
Immature Granulocytes: 1 %
Lymphocytes Relative: 32 %
Lymphs Abs: 2.3 10*3/uL (ref 0.7–4.0)
MCH: 34.5 pg — ABNORMAL HIGH (ref 26.0–34.0)
MCHC: 33.5 g/dL (ref 30.0–36.0)
MCV: 103.1 fL — ABNORMAL HIGH (ref 80.0–100.0)
Monocytes Absolute: 0.4 10*3/uL (ref 0.1–1.0)
Monocytes Relative: 5 %
Neutro Abs: 4.2 10*3/uL (ref 1.7–7.7)
Neutrophils Relative %: 60 %
Platelet Count: 432 10*3/uL — ABNORMAL HIGH (ref 150–400)
RBC: 2.55 MIL/uL — ABNORMAL LOW (ref 3.87–5.11)
RDW: 17.2 % — ABNORMAL HIGH (ref 11.5–15.5)
WBC Count: 7.1 10*3/uL (ref 4.0–10.5)
nRBC: 1.7 % — ABNORMAL HIGH (ref 0.0–0.2)

## 2019-08-03 LAB — LACTATE DEHYDROGENASE: LDH: 443 U/L — ABNORMAL HIGH (ref 98–192)

## 2019-08-03 LAB — SAMPLE TO BLOOD BANK

## 2019-08-03 NOTE — Telephone Encounter (Signed)
CRITICAL VALUE STICKER  CRITICAL VALUE: Total [place] = 5.1.  ALT = 335.  RECEIVER (on-site recipient of call): Isay Perleberg Winston-Spruiell RN, Triage [facility].   DATE & TIME NOTIFIED: 08/03/2019 at 0932.   MESSENGER (representative from lab): Kushani MT [employer].  MD NOTIFIED: Collaborative nurse.  TIME OF NOTIFICATION: 08/03/2019 at 0935.  RESPONSE: None.  Provider F/U visit with pt., currently in progress.

## 2019-08-04 LAB — HAPTOGLOBIN: Haptoglobin: <10 mg/dL — ABNORMAL LOW (ref 42–346)

## 2019-08-06 ENCOUNTER — Telehealth: Payer: Self-pay | Admitting: Hematology

## 2019-08-06 NOTE — Telephone Encounter (Signed)
Scheduled appt per 10/16 los. ° °Spoke with pt and she is aware of the appt date and time. °

## 2019-08-10 ENCOUNTER — Other Ambulatory Visit: Payer: Self-pay

## 2019-08-10 ENCOUNTER — Inpatient Hospital Stay: Payer: Medicare Other

## 2019-08-10 DIAGNOSIS — D461 Refractory anemia with ring sideroblasts: Secondary | ICD-10-CM | POA: Diagnosis not present

## 2019-08-10 DIAGNOSIS — D5911 Warm autoimmune hemolytic anemia: Secondary | ICD-10-CM

## 2019-08-10 LAB — LACTATE DEHYDROGENASE: LDH: 413 U/L — ABNORMAL HIGH (ref 98–192)

## 2019-08-10 LAB — CBC WITH DIFFERENTIAL/PLATELET
Abs Immature Granulocytes: 0.21 10*3/uL — ABNORMAL HIGH (ref 0.00–0.07)
Basophils Absolute: 0 10*3/uL (ref 0.0–0.1)
Basophils Relative: 0 %
Eosinophils Absolute: 0.1 10*3/uL (ref 0.0–0.5)
Eosinophils Relative: 1 %
HCT: 24.5 % — ABNORMAL LOW (ref 36.0–46.0)
Hemoglobin: 8.2 g/dL — ABNORMAL LOW (ref 12.0–15.0)
Immature Granulocytes: 2 %
Lymphocytes Relative: 15 %
Lymphs Abs: 1.7 10*3/uL (ref 0.7–4.0)
MCH: 34.9 pg — ABNORMAL HIGH (ref 26.0–34.0)
MCHC: 33.5 g/dL (ref 30.0–36.0)
MCV: 104.3 fL — ABNORMAL HIGH (ref 80.0–100.0)
Monocytes Absolute: 0.3 10*3/uL (ref 0.1–1.0)
Monocytes Relative: 3 %
Neutro Abs: 8.4 10*3/uL — ABNORMAL HIGH (ref 1.7–7.7)
Neutrophils Relative %: 79 %
Platelets: 473 10*3/uL — ABNORMAL HIGH (ref 150–400)
RBC: 2.35 MIL/uL — ABNORMAL LOW (ref 3.87–5.11)
RDW: 18.3 % — ABNORMAL HIGH (ref 11.5–15.5)
WBC: 10.7 10*3/uL — ABNORMAL HIGH (ref 4.0–10.5)
nRBC: 1.3 % — ABNORMAL HIGH (ref 0.0–0.2)

## 2019-08-10 LAB — CMP (CANCER CENTER ONLY)
ALT: 42 U/L (ref 0–44)
AST: 16 U/L (ref 15–41)
Albumin: 4 g/dL (ref 3.5–5.0)
Alkaline Phosphatase: 56 U/L (ref 38–126)
Anion gap: 9 (ref 5–15)
BUN: 28 mg/dL — ABNORMAL HIGH (ref 8–23)
CO2: 26 mmol/L (ref 22–32)
Calcium: 8.8 mg/dL — ABNORMAL LOW (ref 8.9–10.3)
Chloride: 105 mmol/L (ref 98–111)
Creatinine: 1.19 mg/dL — ABNORMAL HIGH (ref 0.44–1.00)
GFR, Est AFR Am: 52 mL/min — ABNORMAL LOW (ref >60)
GFR, Estimated: 45 mL/min — ABNORMAL LOW (ref >60)
Glucose, Bld: 130 mg/dL — ABNORMAL HIGH (ref 70–99)
Potassium: 4.2 mmol/L (ref 3.5–5.1)
Sodium: 140 mmol/L (ref 135–145)
Total Bilirubin: 3.1 mg/dL — ABNORMAL HIGH (ref 0.3–1.2)
Total Protein: 6.5 g/dL (ref 6.5–8.1)

## 2019-08-10 LAB — SAMPLE TO BLOOD BANK

## 2019-08-15 ENCOUNTER — Other Ambulatory Visit: Payer: Medicare Other

## 2019-08-15 ENCOUNTER — Ambulatory Visit: Payer: Medicare Other | Admitting: Hematology

## 2019-08-16 ENCOUNTER — Other Ambulatory Visit: Payer: Self-pay | Admitting: Hematology

## 2019-08-16 ENCOUNTER — Other Ambulatory Visit: Payer: Self-pay | Admitting: *Deleted

## 2019-08-16 ENCOUNTER — Inpatient Hospital Stay: Payer: Medicare Other

## 2019-08-16 ENCOUNTER — Other Ambulatory Visit: Payer: Self-pay

## 2019-08-16 ENCOUNTER — Inpatient Hospital Stay (HOSPITAL_BASED_OUTPATIENT_CLINIC_OR_DEPARTMENT_OTHER): Payer: Medicare Other | Admitting: Hematology

## 2019-08-16 VITALS — BP 103/57 | HR 68 | Temp 96.9°F | Resp 18 | Ht 65.0 in | Wt 181.1 lb

## 2019-08-16 DIAGNOSIS — D461 Refractory anemia with ring sideroblasts: Secondary | ICD-10-CM

## 2019-08-16 DIAGNOSIS — Z7189 Other specified counseling: Secondary | ICD-10-CM | POA: Diagnosis not present

## 2019-08-16 DIAGNOSIS — D5911 Warm autoimmune hemolytic anemia: Secondary | ICD-10-CM

## 2019-08-16 DIAGNOSIS — Z1589 Genetic susceptibility to other disease: Secondary | ICD-10-CM | POA: Diagnosis not present

## 2019-08-16 LAB — CBC WITH DIFFERENTIAL/PLATELET
Abs Immature Granulocytes: 0.18 10*3/uL — ABNORMAL HIGH (ref 0.00–0.07)
Basophils Absolute: 0 10*3/uL (ref 0.0–0.1)
Basophils Relative: 0 %
Eosinophils Absolute: 0 10*3/uL (ref 0.0–0.5)
Eosinophils Relative: 0 %
HCT: 21.5 % — ABNORMAL LOW (ref 36.0–46.0)
Hemoglobin: 7.2 g/dL — ABNORMAL LOW (ref 12.0–15.0)
Immature Granulocytes: 2 %
Lymphocytes Relative: 26 %
Lymphs Abs: 2.5 10*3/uL (ref 0.7–4.0)
MCH: 35 pg — ABNORMAL HIGH (ref 26.0–34.0)
MCHC: 33.5 g/dL (ref 30.0–36.0)
MCV: 104.4 fL — ABNORMAL HIGH (ref 80.0–100.0)
Monocytes Absolute: 0.4 10*3/uL (ref 0.1–1.0)
Monocytes Relative: 4 %
Neutro Abs: 6.4 10*3/uL (ref 1.7–7.7)
Neutrophils Relative %: 68 %
Platelets: 398 10*3/uL (ref 150–400)
RBC: 2.06 MIL/uL — ABNORMAL LOW (ref 3.87–5.11)
RDW: 18.8 % — ABNORMAL HIGH (ref 11.5–15.5)
WBC: 9.6 10*3/uL (ref 4.0–10.5)
nRBC: 2.3 % — ABNORMAL HIGH (ref 0.0–0.2)

## 2019-08-16 LAB — SAMPLE TO BLOOD BANK

## 2019-08-16 LAB — LACTATE DEHYDROGENASE: LDH: 459 U/L — ABNORMAL HIGH (ref 98–192)

## 2019-08-16 LAB — PREPARE RBC (CROSSMATCH)

## 2019-08-16 LAB — CMP (CANCER CENTER ONLY)
ALT: 21 U/L (ref 0–44)
AST: 12 U/L — ABNORMAL LOW (ref 15–41)
Albumin: 3.9 g/dL (ref 3.5–5.0)
Alkaline Phosphatase: 48 U/L (ref 38–126)
Anion gap: 10 (ref 5–15)
BUN: 27 mg/dL — ABNORMAL HIGH (ref 8–23)
CO2: 25 mmol/L (ref 22–32)
Calcium: 8.5 mg/dL — ABNORMAL LOW (ref 8.9–10.3)
Chloride: 105 mmol/L (ref 98–111)
Creatinine: 1.21 mg/dL — ABNORMAL HIGH (ref 0.44–1.00)
GFR, Est AFR Am: 51 mL/min — ABNORMAL LOW (ref >60)
GFR, Estimated: 44 mL/min — ABNORMAL LOW (ref >60)
Glucose, Bld: 150 mg/dL — ABNORMAL HIGH (ref 70–99)
Potassium: 4 mmol/L (ref 3.5–5.1)
Sodium: 140 mmol/L (ref 135–145)
Total Bilirubin: 2.8 mg/dL — ABNORMAL HIGH (ref 0.3–1.2)
Total Protein: 6.2 g/dL — ABNORMAL LOW (ref 6.5–8.1)

## 2019-08-16 MED ORDER — PREDNISONE 20 MG PO TABS: ORAL_TABLET | ORAL | 0 refills | Status: AC

## 2019-08-16 MED ORDER — OMEPRAZOLE 20 MG PO CPDR: 20.0000 mg | DELAYED_RELEASE_CAPSULE | Freq: Every day | ORAL | 1 refills | Status: DC

## 2019-08-16 NOTE — Progress Notes (Signed)
HEMATOLOGY/ONCOLOGY CLINIC NOTE  Date of Service: 08/16/2019  Patient Care Team: Chesley Noon, MD as PCP - General (Family Medicine) Burtis Junes, NP as PCP - Cardiology (Nurse Practitioner)  CHIEF COMPLAINTS/PURPOSE OF CONSULTATION:  mx of MDS/MPN  HISTORY OF PRESENTING ILLNESS:   Alexandra Pierce is a wonderful 73 y.o. female who has been referred to [place] by Dr. Eliseo Squires for evaluation and management of symptomatic anemia.   Patient has history of hypertension, dyslipidemia, atrial fibrillation on anticoagulation with previous history of CVA x2, obesity who presented with generalized weakness.  Prior to admission she had an episode of blurring of vision especially in the right eye associated with a spell of confusion and possible loss of consciousness for a few seconds associated with disorientation.  She notes that she has also been feeling progressively fatigued for a few weeks and has had difficulty with dyspnea on exertion with short distances. Her daughters insisted she get evaluated and so she came to the emergency room for further evaluation. She does follow-up with cardiology and is being evaluated by pulmonary at the end of the month as well.  Patient previously has been on Coumadin from 2000 but was switched to Xarelto several years ago.  She notes no overt evidence of black stools or bleeding in the stools, no hematuria no nosebleeds no gum bleeds. Notes that she has been generally eating okay. Does endorse new night sweats over the last month or 2. No abdominal pain or significant change in bowel habits.  In the emergency room the patient had labs which showed hemoglobin of 6.6.5 with an MCV of 96.5.  Normal WBC count of 7.2k with elevated platelets of 685k which on repeat were down to 573k. Reticulocyte counts were relatively suppressed. LDH was elevated to 517 but haptoglobin was within normal limits at 45. Coombs negative Myeloma panel showed no M spike Ferritin  level was elevated to 845 suggestive of acute phase reaction/inflammation. B12 and folate within normal limits.  Patient received 2 units of PRBCs with an appropriate bump in her hemoglobin level and felt much better. CT chest abdomen pelvis was done which showed mild splenomegaly without a focal splenic lesion.  Multiple hepatic cysts.  Abnormal hypoenhancement in the right kidney lower pole of undetermined etiology -broad differential based on radiology input. right perirenal stranding is associated with a small amount of fluid in the adjacent right paracolic gutter, and a small fluid collection along the inferior margin of the liver which could be an exophytic cyst, small complex fluid collection, or conceivably a tumor nodule.  No other significant other lymphadenopathy noted.  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 The patient's last visit with [place] was on 08/03/2019. The pt reports that she is doing well overall.  The pt reports she feels very tired.  She feels some jitteriness  from the prednisone  She has noticed a decrease in yellowing of her eyes.  She states that she is taking her B Complex and B-12.   Lab results today (08/16/19) of CBC w/diff and CMP is as follows: all values are WNL except for WBC at 2.06, Hemoglobin 7.2, HCT at 21.5, MCV at 104.4, MCH at 35.0, RDW at 18.8,  nRBC at 2.3,Abs Immature Granulocytes at 0.18,Glucose Bld at 150, BUN at 27, Creatinine at 1.21, Calcium at 8.5, Total Protein at 6.2, AST at 12, Total Bilrubin at 2.8, GFR Est Non Af Am at 44, GR Est AFR Am at 51 08/16/19 LDH at 459  On review of systems, pt reports fatigue, dizziness, less jaundice and denies  abdominal pain, leg swelling and any other symptoms.       MEDICAL HISTORY:  Past Medical History:  Diagnosis Date  . Chronic lower back pain   . Hypercholesteremia   . Hypertension    mild  . Obesity   . Paroxysmal atrial fibrillation (HCC)    managed with anticoagulation and rate control.   . Stroke [facility]) 2001 & 2015   "right side gets weak sometimes if I'm only tired" (03/16/2016)    SURGICAL HISTORY: Past Surgical History:  Procedure Laterality Date  . CARDIAC CATHETERIZATION  03/31/2000   EF 49%/LAD lg vessel which coursed to the apex & gave rise to 2 diagonal branches/ LAD noted to have 30% ostial lesion & tapers to a sm vessel toward the apex but no high grade lesion/1st diagonal med sized vessel & 2nd diagonal sm vessel with no significant disease/ lt ventriculogram reveals mild global hypokinesis w/ an estimated EF of 45-50%  . CARDIAC CATHETERIZATION N/A 03/17/2016   Procedure: Left Heart Cath and Coronary Angiography;  Surgeon: Wellington Hampshire, MD;  Location: [place] [facility];  Service: Cardiovascular;  Laterality: N/A;  . LEFT HEART CATH AND CORONARY ANGIOGRAPHY N/A 03/31/2018   Procedure: LEFT HEART CATH AND CORONARY ANGIOGRAPHY;  Surgeon: Martinique, Peter M, MD;  Location: [place] [facility];  Service: Cardiovascular;  Laterality: N/A;    SOCIAL HISTORY: Social History   Socioeconomic History  . Marital status: Married    Spouse name: Not on file  . Number of children: 2  . Years of education: Not on file  . Highest education level: Not on file  Occupational History  . Occupation: Herbalist  Social Needs  . Financial resource strain: Not on file  . Food insecurity    Worry: Not on file    Inability: Not on file  . Transportation needs    Medical: Not on file    Non-medical: Not on file  Tobacco Use  . Smoking status: Former Smoker    Packs/day: 0.12    Years: 15.00    Pack years: 1.80    Types: Cigarettes    Quit date: 10/18/1978    Years since  quitting: 40.8  . Smokeless tobacco: Never Used  Substance and Sexual Activity  . Alcohol use: Yes    Alcohol/week: 14.0 standard drinks    Types: 14 Glasses of wine per week  . Drug use: No  . Sexual activity: Yes  Lifestyle  . Physical activity    Days per week: Not on file    Minutes per session: Not on file  . Stress: Not on file  Relationships  . Social Herbalist on phone: Not on file    Gets together: Not on file    Attends religious service: Not on file    Active member of club or organization: Not on file    Attends meetings of clubs or organizations: Not on file    Relationship status: Not on file  . Intimate partner violence    Fear of current or ex partner: Not on file    Emotionally abused: Not on file  Physically abused: Not on file    Forced sexual activity: Not on file  Other Topics Concern  . Not on file  Social History Narrative  . Not on file    FAMILY HISTORY: Family History  Problem Relation Age of Onset  . Heart disease Father   . Asthma Father   . Diabetes Father   . Heart disease Mother   . Heart disease Brother   . Hyperlipidemia Sister   . Hyperlipidemia Brother   . Hyperlipidemia Brother   . Cancer Neg Hx     ALLERGIES:  is allergic to sulfonamide derivatives; lipitor [atorvastatin]; pravachol [pravastatin sodium]; and statins.  MEDICATIONS:  Current Outpatient Medications  Medication Sig Dispense Refill  . acetaminophen (TYLENOL) 500 MG tablet Take 1,000 mg by mouth as needed for mild pain or headache.     [name] Kitchen amoxicillin (AMOXIL) 500 MG capsule Take 500 mg by mouth 3 (three) times daily.    . B Complex-C (B-COMPLEX WITH VITAMIN C) tablet Take 1 tablet by mouth daily.    . cholecalciferol (VITAMIN D) 1000 UNITS tablet Take 1,000 Units by mouth daily.    . Cyanocobalamin (B-12) 1000 MCG SUBL Place 1,000 mcg under the tongue daily. 30 each 3  . fenofibrate 160 MG tablet Take 1 tablet (160 mg total) by mouth daily. 90 tablet 3   . folic acid (FOLVITE) 1 MG tablet Take 2 tablets (2 mg total) by mouth daily. 60 tablet 22  . metoprolol tartrate (LOPRESSOR) 25 MG tablet Take 0.5 tablets (12.5 mg total) by mouth 2 (two) times daily. 90 tablet 3  . predniSONE (DELTASONE) 20 MG tablet Take 3 tablets (60 mg total) by mouth daily with breakfast. 60 tablet 1  . XARELTO 20 MG TABS tablet TAKE 1 TABLET BY MOUTH ONCE DAILY 30 tablet 5   No current facility-administered medications for this visit.     REVIEW OF SYSTEMS:   A 10+ POINT REVIEW OF SYSTEMS WAS OBTAINED including neurology, dermatology, psychiatry, cardiac, respiratory, lymph, extremities, GI, GU, Musculoskeletal, constitutional, breasts, reproductive, HEENT.  All pertinent positives are noted in the HPI.  All others are negative.     PHYSICAL EXAMINATION: ECOG FS:2 - Symptomatic, <50% confined to bed  There were no vitals filed for this visit. Wt Readings from Last 3 Encounters:  08/03/19 181 lb 11.2 oz (82.4 kg)  07/16/19 181 lb 11.2 oz (82.4 kg)  05/21/19 184 lb 14.4 oz (83.9 kg)   There is no height or weight on file to calculate BMI.    GENERAL:alert, in no acute distress and comfortable SKIN: no acute rashes, no significant lesions EYES: conjunctiva are pink and non-injected, sclera anicteric OROPHARYNX: MMM, no exudates, no oropharyngeal erythema or ulceration NECK: supple, no JVD LYMPH:  no palpable lymphadenopathy in the cervical, axillary or inguinal regions LUNGS: clear to auscultation b/l with normal respiratory effort HEART: regular rate & rhythm ABDOMEN:  normoactive bowel sounds , non tender, not distended. Extremity: no pedal edema PSYCH: alert & oriented x 3 with fluent speech NEURO: no focal motor/sensory deficits    LABORATORY DATA:  I have reviewed the data as listed  . CBC Latest Ref Rng & Units 08/10/2019 08/03/2019 07/16/2019  WBC 4.0 - 10.5 K/uL 10.7(H) 7.1 5.6  Hemoglobin 12.0 - 15.0 g/dL 8.2(L) 8.8(L) 9.2(L)  Hematocrit  36.0 - 46.0 % 24.5(L) 26.3(L) 27.1(L)  Platelets 150 - 400 K/uL 473(H) 432(H) 463(H)    . CMP Latest Ref Rng & Units 08/10/2019 08/03/2019 07/16/2019  Glucose 70 - 99 mg/dL 130(H) 114(H) 108(H)  BUN 8 - 23 mg/dL 28(H) 28(H) 15  Creatinine 0.44 - 1.00 mg/dL 1.19(H) 1.28(H) 1.00  Sodium 135 - 145 mmol/L 140 144 140  Potassium 3.5 - 5.1 mmol/L 4.2 3.5 3.9  Chloride 98 - 111 mmol/L 105 104 106  CO2 22 - 32 mmol/L 26 26 27   Calcium 8.9 - 10.3 mg/dL 8.8(L) 8.5(L) 8.7(L)  Total Protein 6.5 - 8.1 g/dL 6.5 6.6 6.8  Total Bilirubin 0.3 - 1.2 mg/dL 3.1(H) 5.1(HH) 2.3(H)  Alkaline Phos 38 - 126 U/L 56 85 44  AST 15 - 41 U/L 16 165(H) 27  ALT 0 - 44 U/L 42 335(HH) 19   05/31/18 Molecular Pathology:   05/24/18 Cytogenetics:     05/24/18 BM Bx:   05/24/18 BM Flow cytometry:     RADIOGRAPHIC STUDIES: I have personally reviewed the radiological images as listed and agreed with the findings in the report. No results found.  ASSESSMENT & PLAN:   73 y.o. female with  1. 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 No overt evidence of bleeding.  In the emergency room the patient had labs which showed hemoglobin of 6.6.5 with an MCV of 96.5.  Normal WBC count of 7.2k with elevated platelets of 685k which on repeat were down to 573k. Reticulocyte counts were relatively suppressed. LDH was elevated to 517 but haptoglobin was within normal limits at 45. Coombs negative Myeloma panel showed no M spike Ferritin level was elevated to 845 suggestive of acute phase reaction/inflammation. B12 and folate within normal limits.  Patient received 2 units of PRBCs with an appropriate bump in her hemoglobin level and felt much better.  05/24/18 BM Bx results which revealed erythroid and megakaryocytic proliferation and ring sideroblasts concerning for myeloproliferative/ myelodysplastic neoplasm   No excess blasts; Acute leukemia and lymphoma are not  indicated   2. Rt renal radiographic abnormality as noted in CT with some fluid collection. 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 No fevers chills to suggest acute pyelonephritis or renal abscess. Urine analysis showed trace leuk esterase and 6-10 WBCs no significant hematuria. Broad differential diagnosis based on CT findings as per radiologist. Procalcitonin levels unimpressive  05/23/18 CT Chest Abdomen Pelvis was done which showed mild splenomegaly without a focal splenic lesion. Multiple hepatic cysts.  Abnormal hypoenhancement in the right kidney lower pole of undetermined etiology -broad differential based on radiology input. right perirenal stranding is associated with a small amount of fluid in the adjacent right paracolic gutter, and a small fluid collection along the inferior margin of the liver which could be an exophytic cyst, small complex fluid collection, or conceivably a tumor nodule. No other significant other lymphadenopathy noted.   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 Warm Antibody hemolytic Anemia  PLAN: -Discussed pt labwork today, 08/16/19; CBC w/diff and CMP is as follows: all values are WNL except for WBC at 2.06, Hemoglobin 7.2, HCT at 21.5, MCV at 104.4, MCH at 35.0, RDW at 18.8, nRBC at 2.3,Abs Immature Granulocytes at 0.18,Glucose Bld at 150, BUN at 27, Creatinine at 1.21, Calcium at 8.5, Total Protein at 6.2, AST at 12, Total Bilrubin at 2.8, GFR Est Non Af Am at 44, GR Est AFR Am at 51 PENDING LDH - Discussed that her bilirubin is now 2.8 and is going down and and hemoglobin is  low 7.2 -Discussed that prednisone is helping and she will need to continue  the steriods and taper down -Discussed adding Rituxan to help suppress the antibodies in order to taper off of steroids sooner.    -Advised pt about chemo-counseling about  Rituxan.  -Discussed her underlying MDS/MPN bone marrow problem and additional diagnosed of warm AIHA and answered all her questions to her apparent satisfaction. -Advised that a transfusion is necessary given decreasing her hemoglobin levels. Will see if she can be transfused to day for 1 or 2 units to keep hgb>8 -Advised pt that we will be adding a prescription Omeprazole for acid reflux   FOLLOW UP: Chemo-education for Rituxan ASAP Schedule to Start Rituxan ASAP weekly x 4 doses with labs. MD visit with 2nd and 4th doses of Rituxan PRBC transfusion x 2 units today   The total time spent in the appt was 25 minutes and more than 50% was on counseling and direct patient cares.  All of the patient's questions were answered with apparent satisfaction. The patient knows to call the clinic with any problems, questions or concerns.   Sullivan Lone MD [place] [facility]AHIVMS [facility] Hematology/Oncology Physician [facility]  (Office):       [phone] (Work cell):  [phone] (Fax):           [phone]  I, Scot Dock, am acting as a scribe for Dr. Sullivan Lone.   .I have reviewed the above documentation for accuracy and completeness, and I agree with the above. Brunetta Genera MD

## 2019-08-16 NOTE — Progress Notes (Signed)
START OFF PATHWAY REGIMEN - Other   [id number]:Rituximab (IV/Subcut) D1 Weekly:   A cycle is every 7 days:     Rituximab-xxxx      Rituximab and hyaluronidase human   **Always confirm dose/schedule in your pharmacy ordering system**  Patient Characteristics: Intent of Therapy: Curative Intent, Discussed with Patient 

## 2019-08-17 ENCOUNTER — Other Ambulatory Visit: Payer: Self-pay | Admitting: *Deleted

## 2019-08-17 ENCOUNTER — Telehealth: Payer: Self-pay | Admitting: Hematology

## 2019-08-17 ENCOUNTER — Inpatient Hospital Stay: Payer: Medicare Other

## 2019-08-17 ENCOUNTER — Other Ambulatory Visit: Payer: Self-pay

## 2019-08-17 DIAGNOSIS — D461 Refractory anemia with ring sideroblasts: Secondary | ICD-10-CM

## 2019-08-17 LAB — PREPARE RBC (CROSSMATCH)

## 2019-08-17 MED ORDER — METHYLPREDNISOLONE SODIUM SUCC 125 MG IJ SOLR
60.0000 mg | Freq: Once | INTRAMUSCULAR | Status: AC
  Administered 2019-08-17: 14:00:00 60 mg via INTRAVENOUS

## 2019-08-17 MED ORDER — ACETAMINOPHEN 325 MG PO TABS
ORAL_TABLET | ORAL | Status: AC
  Filled 2019-08-17: qty 2

## 2019-08-17 MED ORDER — DIPHENHYDRAMINE HCL 25 MG PO CAPS
ORAL_CAPSULE | ORAL | Status: AC
  Filled 2019-08-17: qty 1

## 2019-08-17 MED ORDER — DIPHENHYDRAMINE HCL 25 MG PO CAPS
25.0000 mg | ORAL_CAPSULE | Freq: Once | ORAL | Status: AC
  Administered 2019-08-17: 14:00:00 25 mg via ORAL

## 2019-08-17 MED ORDER — METHYLPREDNISOLONE SODIUM SUCC 125 MG IJ SOLR
INTRAMUSCULAR | Status: AC
  Filled 2019-08-17: qty 2

## 2019-08-17 MED ORDER — SODIUM CHLORIDE 0.9% IV SOLUTION
250.0000 mL | Freq: Once | INTRAVENOUS | Status: AC
  Administered 2019-08-17: 250 mL via INTRAVENOUS
  Filled 2019-08-17: qty 250

## 2019-08-17 MED ORDER — ACETAMINOPHEN 325 MG PO TABS
650.0000 mg | ORAL_TABLET | Freq: Once | ORAL | Status: AC
  Administered 2019-08-17: 14:00:00 650 mg via ORAL

## 2019-08-17 NOTE — Patient Instructions (Signed)
Blood Transfusion, Adult, Care After This sheet gives you information about how to care for yourself after your procedure. Your doctor may also give you more specific instructions. If you have problems or questions, contact your doctor. Follow these instructions at home:   Take over-the-counter and prescription medicines only as told by your doctor.  Go back to your normal activities as told by your doctor.  Follow instructions from your doctor about how to take care of the area where an IV tube was put into your vein (insertion site). Make sure you: ? Wash your hands with soap and water before you change your bandage (dressing). If there is no soap and water, use hand sanitizer. ? Change your bandage as told by your doctor.  Check your IV insertion site every day for signs of infection. Check for: ? More redness, swelling, or pain. ? More fluid or blood. ? Warmth. ? Pus or a bad smell. Contact a doctor if:  You have more redness, swelling, or pain around the IV insertion site.  You have more fluid or blood coming from the IV insertion site.  Your IV insertion site feels warm to the touch.  You have pus or a bad smell coming from the IV insertion site.  Your pee (urine) turns pink, red, or brown.  You feel weak after doing your normal activities. Get help right away if:  You have signs of a serious allergic or body defense (immune) system reaction, including: ? Itchiness. ? Hives. ? Trouble breathing. ? Anxiety. ? Pain in your chest or lower back. ? Fever, flushing, and chills. ? Fast pulse. ? Rash. ? Watery poop (diarrhea). ? Throwing up (vomiting). ? Dark pee. ? Serious headache. ? Dizziness. ? Stiff neck. ? Yellow color in your face or the white parts of your eyes (jaundice). Summary  After a blood transfusion, return to your normal activities as told by your doctor.  Every day, check for signs of infection where the IV tube was put into your vein.  Some  signs of infection are warm skin, more redness and pain, more fluid or blood, and pus or a bad smell where the needle went in.  Contact your doctor if you feel weak or have any unusual symptoms. This information is not intended to replace advice given to you by your health care provider. Make sure you discuss any questions you have with your health care provider. Document Released: 10/25/2014 Document Revised: 02/08/2018 Document Reviewed: 05/28/2016 Elsevier Patient Education  2020 [employer]  

## 2019-08-17 NOTE — Telephone Encounter (Signed)
Scheduled appt per 10/29 los.  Spoke with pt and she is aware of her appt dates and time.  Also informed pt of her appt today and pt stated she will be there.

## 2019-08-19 LAB — TYPE AND SCREEN
ABO/RH(D): A POS
Antibody Screen: POSITIVE
Donor AG Type: NEGATIVE
Donor AG Type: NEGATIVE
Unit division: 0
Unit division: 0

## 2019-08-19 LAB — BPAM RBC
Blood Product Expiration Date: 202011142359
Blood Product Expiration Date: 202012032359
ISSUE DATE / TIME: 202010301421
ISSUE DATE / TIME: 202010301421
Unit Type and Rh: 5100
Unit Type and Rh: 5100

## 2019-08-21 ENCOUNTER — Other Ambulatory Visit: Payer: Self-pay | Admitting: Hematology

## 2019-08-21 ENCOUNTER — Other Ambulatory Visit: Payer: Self-pay

## 2019-08-21 ENCOUNTER — Inpatient Hospital Stay: Payer: Medicare Other | Attending: Hematology

## 2019-08-21 DIAGNOSIS — D461 Refractory anemia with ring sideroblasts: Secondary | ICD-10-CM | POA: Insufficient documentation

## 2019-08-21 DIAGNOSIS — Z5112 Encounter for antineoplastic immunotherapy: Secondary | ICD-10-CM | POA: Insufficient documentation

## 2019-08-21 DIAGNOSIS — D589 Hereditary hemolytic anemia, unspecified: Secondary | ICD-10-CM | POA: Insufficient documentation

## 2019-08-21 DIAGNOSIS — D473 Essential (hemorrhagic) thrombocythemia: Secondary | ICD-10-CM | POA: Insufficient documentation

## 2019-08-21 MED ORDER — ONDANSETRON HCL 4 MG PO TABS: 4.0000 mg | ORAL_TABLET | Freq: Three times a day (TID) | ORAL | 0 refills | Status: DC | PRN

## 2019-08-23 ENCOUNTER — Other Ambulatory Visit: Payer: Medicare Other

## 2019-08-23 NOTE — Progress Notes (Signed)
CARDIOLOGY OFFICE NOTE  Date:  08/29/2019    Alexandra Pierce Date of Birth: [birth year] Medical Record [id number]  PCP:  Chesley Noon, MD  Cardiologist:  Servando Snare Allred    Chief Complaint  Patient presents with  . Follow-up    Seen for Dr Rayann Heman    History of Present Illness: Alexandra Pierce is a 73 y.o. female who presents today for a follow up visit.  She is seen for Dr. Rayann Heman - former patient of Dr.Tennant's. Primarily follows with me.   She has PAF, remote stroke, and on chronic anticoagulation - now on Xarelto due to labile readings with her coumadin. Other issues include HLD, HTN and obesity. She has had statin intolerance. Prior cath in 2001 with mild CAD and EF of 45 to 50%.  Hospitalized back in March of 2015 with recurrent stroke - multiple infarcts on MRI. Had missed several doses of her Xarelto. Was in atrial fib on arrival to the ER at that time. Understood the need to NOT miss her Xarelto. She had been traveling and really got out of her routine when that happened.   Discharged from [facility] on June 1 of 2017 - had chest pain - was in atrial fib - she was cathed - EF haddropped but was normal by echo. Mild to moderate non obstructive CAD noted on cath with medical management recommended.TheLVEF noted to be reduced on LV gram but echo thatsame day showednormal LV function.I hadDr. Myles Gip the echo images and the inferior wallwas noted to be movingwell. He estimateedher overall LVEF by echo to beabout50%. He speculated that theabnormality on LV function noted on LV gram may have been due to PVCs just before the imaging.Flecainide was stopped, metoprolol added and she is now managed with rate control and continued anticoagulation.   Ihave followed her since - she has done ok - medicines have gotten mixed up from time to time. At last visit in 08-Nov-2017 - her husband died back in July 09, 2017 and brother had died in 10/10/2023.  Had lost her PCP. She was pretty overwhelmed. Cardiac status ok.I saw her back in June of 2019 - she was not doing well - lots of exertional chest pain and shortness of breath - she was referred for cardiac cath - this turned out ok - she continues with medical management from our standpoint. Her symptoms however persisted - she was referred to pulmonary.   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 She has been noted to have markedly low diffusion capacity on PFTs (that was not corrected for her remarkably low hemoglobin - seeing Dr. Lake Bells - he suspects her diffusion capacity is probably closer to normal. 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 She has required transfusions about every 1 to 1 1/2 months. She has to have a blood warmer.   We did a telehealth visit back in May - she was doing ok - biggest issue was getting her anemia under control. She had retired from her job due to the symptoms and needed care of her anemia. Cardiac status was ok.  The patient does not have symptoms concerning for COVID-19 infection (fever, chills, cough, or new shortness of breath).   Comes in today. Here alone. She notes she is "just tired" and "tired of being tired". Blood count is 8. She has just started infusions of protein for her anemia and blood disorder. She has been transfused multiple times. She noted on the med list that the Metoprolol was 1/2 a tablet BID - she has been taking a whole tablet - BP is soft. She does not use salt. She feels her heart beating fast at times - mostly with activity. She is short of breath with activity.    Past Medical History:  Diagnosis Date   . Chronic lower back pain   . Hypercholesteremia   . Hypertension    mild  . Obesity   . Paroxysmal atrial fibrillation (HCC)    managed with anticoagulation and rate control.   . Stroke [facility]) 2001 & 2015   "right side gets weak sometimes if I'm only tired" (03/16/2016)    Past Surgical History:  Procedure Laterality Date  . CARDIAC CATHETERIZATION  03/31/2000   EF 49%/LAD lg vessel which coursed to the apex & gave rise to 2 diagonal branches/ LAD noted to have 30% ostial lesion & tapers to a sm vessel toward the apex but no high grade lesion/1st diagonal med sized vessel & 2nd diagonal sm vessel with no significant disease/ lt ventriculogram reveals mild global hypokinesis w/ an estimated EF of 45-50%  . CARDIAC CATHETERIZATION N/A 03/17/2016   Procedure: Left Heart Cath and Coronary Angiography;  Surgeon: Wellington Hampshire, MD;  Location: [facility];  Service: Cardiovascular;  Laterality: N/A;  . LEFT HEART CATH AND CORONARY ANGIOGRAPHY N/A 03/31/2018   Procedure: LEFT HEART CATH AND CORONARY ANGIOGRAPHY;  Surgeon: Martinique, Peter M, MD;  Location: [facility];  Service: Cardiovascular;  Laterality: N/A;     Medications: Current Meds  Medication Sig  . acetaminophen (TYLENOL) 500 MG tablet Take 1,000 mg by mouth as needed for mild pain or headache.   . B Complex-C (B-COMPLEX WITH VITAMIN C) tablet Take 1 tablet by mouth daily.  . cholecalciferol (VITAMIN D) 1000 UNITS tablet Take 1,000 Units by mouth daily.  . Cyanocobalamin (B-12) 1000 MCG SUBL Place 1,000 mcg under the tongue daily.  . fenofibrate 160 MG tablet Take 1 tablet (160 mg total) by mouth daily.  . folic acid (FOLVITE) 1 MG tablet Take 2 tablets (2 mg total) by mouth daily.  . metoprolol tartrate (LOPRESSOR) 25 MG tablet Take 0.5 tablets (12.5 mg total) by mouth 2 (two) times daily.  [name] Kitchen omeprazole (PRILOSEC) 20 MG capsule TAKE 1 CAPSULE(20 MG) BY MOUTH DAILY  . ondansetron (ZOFRAN) 4 MG tablet Take 1 tablet (4  mg total) by mouth every 8 (eight) hours as needed for nausea or vomiting.  . predniSONE (DELTASONE) 20 MG tablet Take 3 tablets (60 mg total) by mouth daily with breakfast for 7 days, THEN 2.5 tablets (50 mg total) daily with breakfast for 7 days, THEN 2 tablets (40 mg total) daily with breakfast for 14 days.  Alveda Reasons 20 MG TABS tablet TAKE 1 TABLET BY MOUTH ONCE DAILY     Allergies: Allergies  Allergen Reactions  . Sulfonamide Derivatives Anaphylaxis  . Lipitor [Atorvastatin] Other (See Comments)    Caused bladder infection  . Pravachol [Pravastatin Sodium] Other (See Comments)    Myalgias  . Statins Other (See Comments)    Myalgia  Social History: The patient  reports that she quit smoking about 40 years ago. Her smoking use included cigarettes. She has a 1.80 pack-year smoking history. She has never used smokeless tobacco. She reports current alcohol use of about 14.0 standard drinks of alcohol per week. She reports that she does not use drugs.   Family History: The patient's family history includes Asthma in her father; Diabetes in her father; Heart disease in her brother, father, and mother; Hyperlipidemia in her brother, brother, and sister.   Review of Systems: Please see the history of present illness.   All other systems are reviewed and negative.   Physical Exam: VS:  BP 112/60   Pulse 94   Ht 5\' 5"  (1.651 m)   Wt 178 lb 6.4 oz (80.9 kg)   SpO2 97%   BMI 29.69 kg/m  .  BMI Body mass index is 29.69 kg/m.  Wt Readings from Last 3 Encounters:  08/29/19 178 lb 6.4 oz (80.9 kg)  08/24/19 182 lb 8 oz (82.8 kg)  08/16/19 181 lb 1.6 oz (82.1 kg)    General: Pleasant. Alert and in no acute distress. Her color is more sallow and pale now.   HEENT: Normal.  Neck: Supple, no JVD, carotid bruits, or masses noted.  Cardiac: Irregular irregular rhythm. Her rate is fair.  No edema.  Respiratory:  Lungs are clear to auscultation bilaterally with normal work of breathing.   GI: Soft and nontender.  MS: No deformity or atrophy. Gait and ROM intact.  Skin: Warm and dry. Color is normal.  Neuro:  Strength and sensation are intact and no gross focal deficits noted.  Psych: Alert, appropriate and with normal affect.   LABORATORY DATA:  EKG:  EKG is ordered today. This demonstrates AF with fair VR - HR is 94 today.  Lab Results  Component Value Date   WBC 8.4 08/24/2019   HGB 8.0 (L) 08/24/2019   HCT 24.2 (L) 08/24/2019   PLT 317 08/24/2019   GLUCOSE 104 (H) 08/24/2019   CHOL 201 (H) 03/27/2018   TRIG 75 03/27/2018   HDL 51 03/27/2018   LDLDIRECT 163.2 01/19/2013   LDLCALC 135 (H) 03/27/2018   ALT 18 08/24/2019   AST 14 (L) 08/24/2019   NA 144 08/24/2019   K 3.4 (L) 08/24/2019   CL 106 08/24/2019   CREATININE 1.17 (H) 08/24/2019   BUN 23 08/24/2019   CO2 28 08/24/2019   TSH 1.626 05/22/2018   INR 1.29 05/23/2018   HGBA1C 5.6 01/11/2014     BNP (last 3 results) No results for input(s): BNP in the last 8760 hours.  ProBNP (last 3 results) No results for input(s): PROBNP in the last 8760 hours.   Other Studies Reviewed Today:  LEFT HEART CATH AND CORONARY ANGIOGRAPHYJune 2019  Conclusion     Dist LAD lesion is 40% stenosed.  Mid LAD lesion is 20% stenosed.  Ost LAD lesion is 30% stenosed.  Mid Cx lesion is 20% stenosed.  Mid RCA lesion is 20% stenosed.  The left ventricular systolic function is normal.  LV end diastolic pressure is normal.  The left ventricular ejection fraction is 55-65% by visual estimate.  1. Mild nonobstructive CAD 2. Normal LV function 3. Normal LVEDP  Plan: There is no change compared to 2017. I cannot explain her symptoms based on findings today.   Echo Study Conclusions from 02/2016  - Left ventricle: The cavity size was normal. Systolic function was  normal. The estimated ejection fraction  was 55%.Wall motion was  normal; there were no regional wall motion abnormalities. Normal   sinus rhythm was absent. The study is not technically sufficient  to allow evaluation of LV diastolic function. - Aortic valve: Moderately calcified annulus. Trileaflet; mildly  thickened, mildly calcified leaflets. - Mitral valve: There was mild regurgitation. - Left atrium: The atrium was moderately dilated. - Pulmonary arteries: PA peak pressure: 32 mm Hg (S).     ASSESSMENT & PLAN:    1. CAD  - non obstructive per prior cath - managed medially.   2. RARS with JAC2 mutation - has started protein infusions and has had prior transfusion and on chronic steroid therapy. Blood count is low.   3. Persistent AF - rate is fair - I suspect this is driven by her anemia - she is on full dose Metoprolol - I am fine for her to be on either dose - BP is soft and she can liberalize her salt to compensate.   4. HTN - BP now soft - see above.   5. Chronic anticoagulation - she has no active bleeding but with RARS with JAC2 mutation along with thrombocytosis.   6. HLD - intolerant to statins - levels have never been ideal - she is on just Fenofibrate - she has had so many lab draws - her LFTs are recent - will hold on rechecking - as I do not see this changing our course of therapy or outlook.   -remains on Xarelto - now noted to have RARS  7. Prior stroke- minimal residual deficit. Not discussed.   8. Carotid disease-her last study from January showed 1 to 39% bilateral disease - will plan to repeat in January of 2021. Not discussed today.   9. COVID-19 Education: The signs and symptoms of COVID-19 were discussed with the patient and how to seek care for testing (follow up with PCP or arrange E-visit).  The importance of social distancing, staying at home, hand hygiene and wearing a mask when out in public were discussed today.  Current medicines are reviewed with the patient today.  The patient does not have concerns regarding medicines other than what has been noted above.  The  following changes have been made:  See above.  Labs/ tests ordered today include:    Orders Placed This Encounter  Procedures  . EKG 12-Lead     Disposition:   FU with me in 6 months. She is certainly in a challenging position at this time.   Patient is agreeable to this plan and will call if any problems develop in the interim.   SignedTruitt Merle, NP  08/29/2019 12:08 PM  [facility] [address] [place], [place]  [address] Phone: [phone] Fax: [phone]

## 2019-08-24 ENCOUNTER — Inpatient Hospital Stay: Payer: Medicare Other

## 2019-08-24 ENCOUNTER — Other Ambulatory Visit: Payer: Self-pay

## 2019-08-24 ENCOUNTER — Other Ambulatory Visit: Payer: Medicare Other

## 2019-08-24 VITALS — BP 116/71 | HR 87 | Temp 98.4°F | Resp 16 | Wt 182.5 lb

## 2019-08-24 DIAGNOSIS — D5911 Warm autoimmune hemolytic anemia: Secondary | ICD-10-CM

## 2019-08-24 DIAGNOSIS — D461 Refractory anemia with ring sideroblasts: Secondary | ICD-10-CM

## 2019-08-24 DIAGNOSIS — D589 Hereditary hemolytic anemia, unspecified: Secondary | ICD-10-CM | POA: Diagnosis not present

## 2019-08-24 DIAGNOSIS — D473 Essential (hemorrhagic) thrombocythemia: Secondary | ICD-10-CM | POA: Diagnosis present

## 2019-08-24 DIAGNOSIS — Z5112 Encounter for antineoplastic immunotherapy: Secondary | ICD-10-CM | POA: Diagnosis not present

## 2019-08-24 DIAGNOSIS — Z7189 Other specified counseling: Secondary | ICD-10-CM

## 2019-08-24 LAB — SAMPLE TO BLOOD BANK

## 2019-08-24 LAB — HEPATITIS B CORE ANTIBODY, TOTAL: Hep B Core Total Ab: NONREACTIVE

## 2019-08-24 LAB — CMP (CANCER CENTER ONLY)
ALT: 18 U/L (ref 0–44)
AST: 14 U/L — ABNORMAL LOW (ref 15–41)
Albumin: 4.1 g/dL (ref 3.5–5.0)
Alkaline Phosphatase: 46 U/L (ref 38–126)
Anion gap: 10 (ref 5–15)
BUN: 23 mg/dL (ref 8–23)
CO2: 28 mmol/L (ref 22–32)
Calcium: 8.8 mg/dL — ABNORMAL LOW (ref 8.9–10.3)
Chloride: 106 mmol/L (ref 98–111)
Creatinine: 1.17 mg/dL — ABNORMAL HIGH (ref 0.44–1.00)
GFR, Est AFR Am: 53 mL/min — ABNORMAL LOW (ref >60)
GFR, Estimated: 46 mL/min — ABNORMAL LOW (ref >60)
Glucose, Bld: 104 mg/dL — ABNORMAL HIGH (ref 70–99)
Potassium: 3.4 mmol/L — ABNORMAL LOW (ref 3.5–5.1)
Sodium: 144 mmol/L (ref 135–145)
Total Bilirubin: 3.4 mg/dL — ABNORMAL HIGH (ref 0.3–1.2)
Total Protein: 6.3 g/dL — ABNORMAL LOW (ref 6.5–8.1)

## 2019-08-24 LAB — HEPATITIS C ANTIBODY: HCV Ab: NONREACTIVE

## 2019-08-24 LAB — CBC WITH DIFFERENTIAL/PLATELET
Abs Immature Granulocytes: 0.11 10*3/uL — ABNORMAL HIGH (ref 0.00–0.07)
Basophils Absolute: 0 10*3/uL (ref 0.0–0.1)
Basophils Relative: 0 %
Eosinophils Absolute: 0.1 10*3/uL (ref 0.0–0.5)
Eosinophils Relative: 1 %
HCT: 24.2 % — ABNORMAL LOW (ref 36.0–46.0)
Hemoglobin: 8 g/dL — ABNORMAL LOW (ref 12.0–15.0)
Immature Granulocytes: 1 %
Lymphocytes Relative: 36 %
Lymphs Abs: 3 10*3/uL (ref 0.7–4.0)
MCH: 34.8 pg — ABNORMAL HIGH (ref 26.0–34.0)
MCHC: 33.1 g/dL (ref 30.0–36.0)
MCV: 105.2 fL — ABNORMAL HIGH (ref 80.0–100.0)
Monocytes Absolute: 0.4 10*3/uL (ref 0.1–1.0)
Monocytes Relative: 5 %
Neutro Abs: 4.8 10*3/uL (ref 1.7–7.7)
Neutrophils Relative %: 57 %
Platelets: 317 10*3/uL (ref 150–400)
RBC: 2.3 MIL/uL — ABNORMAL LOW (ref 3.87–5.11)
RDW: 21.2 % — ABNORMAL HIGH (ref 11.5–15.5)
WBC: 8.4 10*3/uL (ref 4.0–10.5)
nRBC: 1.8 % — ABNORMAL HIGH (ref 0.0–0.2)

## 2019-08-24 LAB — HEPATITIS B SURFACE ANTIGEN: Hepatitis B Surface Ag: NONREACTIVE

## 2019-08-24 LAB — LACTATE DEHYDROGENASE: LDH: 448 U/L — ABNORMAL HIGH (ref 98–192)

## 2019-08-24 MED ORDER — SODIUM CHLORIDE 0.9 % IV SOLN
375.0000 mg/m2 | Freq: Once | INTRAVENOUS | Status: AC
  Administered 2019-08-24: 700 mg via INTRAVENOUS
  Filled 2019-08-24: qty 50

## 2019-08-24 MED ORDER — DIPHENHYDRAMINE HCL 25 MG PO CAPS
50.0000 mg | ORAL_CAPSULE | Freq: Once | ORAL | Status: AC
  Administered 2019-08-24: 50 mg via ORAL

## 2019-08-24 MED ORDER — SODIUM CHLORIDE 0.9 % IV SOLN
Freq: Once | INTRAVENOUS | Status: AC
  Administered 2019-08-24: 13:00:00 via INTRAVENOUS
  Filled 2019-08-24: qty 250

## 2019-08-24 MED ORDER — FAMOTIDINE IN NACL 20-0.9 MG/50ML-% IV SOLN
INTRAVENOUS | Status: AC
  Filled 2019-08-24: qty 50

## 2019-08-24 MED ORDER — METHYLPREDNISOLONE SODIUM SUCC 125 MG IJ SOLR
125.0000 mg | Freq: Every day | INTRAMUSCULAR | Status: DC
  Administered 2019-08-24: 125 mg via INTRAVENOUS

## 2019-08-24 MED ORDER — ACETAMINOPHEN 325 MG PO TABS
650.0000 mg | ORAL_TABLET | Freq: Once | ORAL | Status: AC
  Administered 2019-08-24: 650 mg via ORAL

## 2019-08-24 MED ORDER — DIPHENHYDRAMINE HCL 25 MG PO CAPS
ORAL_CAPSULE | ORAL | Status: AC
  Filled 2019-08-24: qty 2

## 2019-08-24 MED ORDER — ACETAMINOPHEN 325 MG PO TABS
ORAL_TABLET | ORAL | Status: AC
  Filled 2019-08-24: qty 2

## 2019-08-24 MED ORDER — FAMOTIDINE IN NACL 20-0.9 MG/50ML-% IV SOLN
20.0000 mg | Freq: Once | INTRAVENOUS | Status: AC
  Administered 2019-08-24: 20 mg via INTRAVENOUS

## 2019-08-24 MED ORDER — METHYLPREDNISOLONE SODIUM SUCC 125 MG IJ SOLR
INTRAMUSCULAR | Status: AC
  Filled 2019-08-24: qty 2

## 2019-08-24 NOTE — Progress Notes (Signed)
Per Dr. Benay Spice ok to treat today with Total Bili =3.4

## 2019-08-24 NOTE — Patient Instructions (Addendum)
[facility] Discharge Instructions for Patients Receiving Chemotherapy  Today you received the following chemotherapy agent: Rituxumab  To help prevent nausea and vomiting after your treatment, we encourage you to take your nausea medication as directed by your MD.   If you develop nausea and vomiting that is not controlled by your nausea medication, call the clinic.   BELOW ARE SYMPTOMS THAT SHOULD BE REPORTED IMMEDIATELY:  *FEVER GREATER THAN 100.5 F  *CHILLS WITH OR WITHOUT FEVER  NAUSEA AND VOMITING THAT IS NOT CONTROLLED WITH YOUR NAUSEA MEDICATION  *UNUSUAL SHORTNESS OF BREATH  *UNUSUAL BRUISING OR BLEEDING  TENDERNESS IN MOUTH AND THROAT WITH OR WITHOUT PRESENCE OF ULCERS  *URINARY PROBLEMS  *BOWEL PROBLEMS  UNUSUAL RASH Items with * indicate a potential emergency and should be followed up as soon as possible.  Feel free to call the clinic should you have any questions or concerns. The clinic phone number is [phone].  Rituximab injection What is this medicine? RITUXIMAB (ri TUX i mab) is a monoclonal antibody. It is used to treat certain types of cancer like non-Hodgkin lymphoma and chronic lymphocytic leukemia. It is also used to treat rheumatoid arthritis, granulomatosis with polyangiitis (or Wegener's granulomatosis), microscopic polyangiitis, and pemphigus vulgaris. This medicine may be used for other purposes; ask your health care provider or pharmacist if you have questions. COMMON BRAND NAME(S): Rituxan, RUXIENCE What should I tell my health care provider before I take this medicine? They need to know if you have any of these conditions:  heart disease  infection (especially a virus infection such as hepatitis B, chickenpox, cold sores, or herpes)  immune system problems  irregular heartbeat  kidney disease  low blood counts, like low white cell, platelet, or red cell counts  lung or breathing disease, like asthma  recently  received or scheduled to receive a vaccine  an unusual or allergic reaction to rituximab, other medicines, foods, dyes, or preservatives  pregnant or trying to get pregnant  breast-feeding How should I use this medicine? This medicine is for infusion into a vein. It is administered in a hospital or clinic by a specially trained health care professional. A special MedGuide will be given to you by the pharmacist with each prescription and refill. Be sure to read this information carefully each time. Talk to your pediatrician regarding the use of this medicine in children. This medicine is not approved for use in children. Overdosage: If you think you have taken too much of this medicine contact a poison control center or emergency room at once. NOTE: This medicine is only for you. Do not share this medicine with others. What if I miss a dose? It is important not to miss a dose. Call your doctor or health care professional if you are unable to keep an appointment. What may interact with this medicine?  cisplatin  live virus vaccines This list may not describe all possible interactions. Give your health care provider a list of all the medicines, herbs, non-prescription drugs, or dietary supplements you use. Also tell them if you smoke, drink alcohol, or use illegal drugs. Some items may interact with your medicine. What should I watch for while using this medicine? Your condition will be monitored carefully while you are receiving this medicine. You may need blood work done while you are taking this medicine. This medicine can cause serious allergic reactions. To reduce your risk you may need to take medicine before treatment with this medicine. Take your medicine as  directed. In some patients, this medicine may cause a serious brain infection that may cause death. If you have any problems seeing, thinking, speaking, walking, or standing, tell your healthcare professional right away. If you  cannot reach your healthcare professional, urgently seek other source of medical care. Call your doctor or health care professional for advice if you get a fever, chills or sore throat, or other symptoms of a cold or flu. Do not treat yourself. This drug decreases your body's ability to fight infections. Try to avoid being around people who are sick. Do not become pregnant while taking this medicine or for at least 12 months after stopping it. Women should inform their doctor if they wish to become pregnant or think they might be pregnant. There is a potential for serious side effects to an unborn child. Talk to your health care professional or pharmacist for more information. Do not breast-feed an infant while taking this medicine or for at least 6 months after stopping it. What side effects may I notice from receiving this medicine? Side effects that you should report to your doctor or health care professional as soon as possible:  allergic reactions like skin rash, itching or hives; swelling of the face, lips, or tongue  breathing problems  chest pain  changes in vision  diarrhea  headache with fever, neck stiffness, sensitivity to light, nausea, or confusion  fast, irregular heartbeat  loss of memory  low blood counts - this medicine may decrease the number of white blood cells, red blood cells and platelets. You may be at increased risk for infections and bleeding.  mouth sores  problems with balance, talking, or walking  redness, blistering, peeling or loosening of the skin, including inside the mouth  signs of infection - fever or chills, cough, sore throat, pain or difficulty passing urine  signs and symptoms of kidney injury like trouble passing urine or change in the amount of urine  signs and symptoms of liver injury like dark yellow or brown urine; general ill feeling or flu-like symptoms; light-colored stools; loss of appetite; nausea; right upper belly pain; unusually  weak or tired; yellowing of the eyes or skin  signs and symptoms of low blood pressure like dizziness; feeling faint or lightheaded, falls; unusually weak or tired  stomach pain  swelling of the ankles, feet, hands  unusual bleeding or bruising  vomiting Side effects that usually do not require medical attention (report to your doctor or health care professional if they continue or are bothersome):  headache  joint pain  muscle cramps or muscle pain  nausea  tiredness This list may not describe all possible side effects. Call your doctor for medical advice about side effects. You may report side effects to FDA at 1-800-FDA-1088. Where should I keep my medicine? This drug is given in a hospital or clinic and will not be stored at home. NOTE: This sheet is a summary. It may not cover all possible information. If you have questions about this medicine, talk to your doctor, pharmacist, or health care provider.  2020 Elsevier/Gold Standard (2018-11-15 22:01:36)   Please show the [facility] at check-in to the Emergency Department and triage nurse. Coronavirus (COVID-19) Are you at risk?  Are you at risk for the Coronavirus (COVID-19)?  To be considered HIGH RISK for Coronavirus (COVID-19), you have to meet the following criteria:  . Traveled to [place], [place], [place], [place] or [place]; or in the [place] to [place], [place], [place], or [place]; and have fever, cough, and shortness of breath within the last 2 weeks of travel OR . Been in close contact with a person diagnosed with COVID-19 within the last 2 weeks and have fever, cough, and shortness of breath . IF YOU DO NOT MEET THESE CRITERIA, YOU ARE CONSIDERED LOW RISK FOR COVID-19.  What to do if you are HIGH RISK for COVID-19?  [name] Kitchen If you are having a medical emergency, call 911. . Seek medical care right away. Before you go to a doctor's office, urgent care or emergency department, call ahead and tell  them about your recent travel, contact with someone diagnosed with COVID-19, and your symptoms. You should receive instructions from your physician's office regarding next steps of care.  . When you arrive at healthcare provider, tell the healthcare staff immediately you have returned from visiting [place], [place], [place], [place] or [place]; or traveled in the [place] to [place], [place], [place], or [place]; in the last two weeks or you have been in close contact with a person diagnosed with COVID-19 in the last 2 weeks.   . Tell the health care staff about your symptoms: fever, cough and shortness of breath. . After you have been seen by a medical provider, you will be either: o Tested for (COVID-19) and discharged home on quarantine except to seek medical care if symptoms worsen, and asked to  - Stay home and avoid contact with others until you get your results (4-5 days)  - Avoid travel on public transportation if possible (such as bus, train, or airplane) or o Sent to the Emergency Department by EMS for evaluation, COVID-19 testing, and possible admission depending on your condition and test results.  What to do if you are LOW RISK for COVID-19?  Reduce your risk of any infection by using the same precautions used for avoiding the common cold or flu:  [name] Kitchen Wash your hands often with soap and warm water for at least 20 seconds.  If soap and water are not readily available, use an alcohol-based hand sanitizer with at least 60% alcohol.  . If coughing or sneezing, cover your mouth and nose by coughing or sneezing into the elbow areas of your shirt or coat, into a tissue or into your sleeve (not your hands). . Avoid shaking hands with others and consider head nods or verbal greetings only. . Avoid touching your eyes, nose, or mouth with unwashed hands.  . Avoid close contact with people who are sick. . Avoid places or events with large numbers of people in one location, like concerts or  sporting events. . Carefully consider travel plans you have or are making. . If you are planning any travel outside or inside the [place], visit the CDC's Travelers' Health webpage for the latest health notices. . If you have some symptoms but not all symptoms, continue to monitor at home and seek medical attention if your symptoms worsen. . If you are having a medical emergency, call 911.   [place] / e-Visit: [email or web address]         [facility]: [facility]: [id number]                   [facility]: [phone]

## 2019-08-29 ENCOUNTER — Ambulatory Visit: Payer: Medicare Other | Admitting: Nurse Practitioner

## 2019-08-29 ENCOUNTER — Other Ambulatory Visit: Payer: Self-pay

## 2019-08-29 ENCOUNTER — Encounter: Payer: Self-pay | Admitting: Nurse Practitioner

## 2019-08-29 VITALS — BP 112/60 | HR 94 | Ht 65.0 in | Wt 178.4 lb

## 2019-08-29 DIAGNOSIS — I482 Chronic atrial fibrillation, unspecified: Secondary | ICD-10-CM | POA: Diagnosis not present

## 2019-08-29 DIAGNOSIS — D461 Refractory anemia with ring sideroblasts: Secondary | ICD-10-CM

## 2019-08-29 DIAGNOSIS — Z7189 Other specified counseling: Secondary | ICD-10-CM

## 2019-08-29 DIAGNOSIS — Z7901 Long term (current) use of anticoagulants: Secondary | ICD-10-CM | POA: Diagnosis not present

## 2019-08-29 DIAGNOSIS — R0602 Shortness of breath: Secondary | ICD-10-CM | POA: Diagnosis not present

## 2019-08-29 DIAGNOSIS — I259 Chronic ischemic heart disease, unspecified: Secondary | ICD-10-CM | POA: Diagnosis not present

## 2019-08-29 NOTE — Patient Instructions (Addendum)
After Visit Summary:  We will be checking the following labs today - NONE   Medication Instructions:    Continue with your current medicines. BUT  I am ok with you trying 1/2 of Metoprolol twice a day - if this makes your palpitations worse - go back to the full tablet twice a day.   If you need a refill on your cardiac medications before your next appointment, please call your pharmacy.     Testing/Procedures To Be Arranged:  N/A  Follow-Up:   See me in 6 months    At [facility], you and your health needs are our priority.  As part of our continuing mission to provide you with exceptional heart care, we have created designated Provider Care Teams.  These Care Teams include your primary Cardiologist (physician) and Advanced Practice Providers (APPs -  Physician Assistants and Nurse Practitioners) who all work together to provide you with the care you need, when you need it.  Special Instructions:  . Stay safe, stay home, wash your hands for at least 20 seconds and wear a mask when out in public.  . It was good to talk with you today. Madaline Brilliant to increase your salt - have a V8!   Call the [facility] office at [phone] if you have any questions, problems or concerns.

## 2019-08-30 ENCOUNTER — Inpatient Hospital Stay: Payer: Medicare Other

## 2019-08-30 ENCOUNTER — Inpatient Hospital Stay: Payer: Medicare Other | Admitting: Hematology

## 2019-08-30 ENCOUNTER — Other Ambulatory Visit: Payer: Self-pay | Admitting: *Deleted

## 2019-08-30 ENCOUNTER — Other Ambulatory Visit: Payer: Self-pay | Admitting: Hematology

## 2019-08-30 ENCOUNTER — Other Ambulatory Visit: Payer: Self-pay

## 2019-08-30 VITALS — BP 96/61 | HR 88 | Temp 98.3°F | Resp 18 | Ht 65.0 in | Wt 179.9 lb

## 2019-08-30 VITALS — BP 108/70 | HR 91 | Temp 98.6°F | Resp 18

## 2019-08-30 DIAGNOSIS — D5911 Warm autoimmune hemolytic anemia: Secondary | ICD-10-CM

## 2019-08-30 DIAGNOSIS — D649 Anemia, unspecified: Secondary | ICD-10-CM | POA: Diagnosis not present

## 2019-08-30 DIAGNOSIS — D461 Refractory anemia with ring sideroblasts: Secondary | ICD-10-CM

## 2019-08-30 DIAGNOSIS — Z5112 Encounter for antineoplastic immunotherapy: Secondary | ICD-10-CM | POA: Diagnosis not present

## 2019-08-30 DIAGNOSIS — Z7189 Other specified counseling: Secondary | ICD-10-CM

## 2019-08-30 LAB — CBC WITH DIFFERENTIAL/PLATELET
Abs Immature Granulocytes: 0.14 10*3/uL — ABNORMAL HIGH (ref 0.00–0.07)
Basophils Absolute: 0 10*3/uL (ref 0.0–0.1)
Basophils Relative: 0 %
Eosinophils Absolute: 0.1 10*3/uL (ref 0.0–0.5)
Eosinophils Relative: 1 %
HCT: 22 % — ABNORMAL LOW (ref 36.0–46.0)
Hemoglobin: 7.6 g/dL — ABNORMAL LOW (ref 12.0–15.0)
Immature Granulocytes: 2 %
Lymphocytes Relative: 29 %
Lymphs Abs: 2.2 10*3/uL (ref 0.7–4.0)
MCH: 36 pg — ABNORMAL HIGH (ref 26.0–34.0)
MCHC: 34.5 g/dL (ref 30.0–36.0)
MCV: 104.3 fL — ABNORMAL HIGH (ref 80.0–100.0)
Monocytes Absolute: 0.3 10*3/uL (ref 0.1–1.0)
Monocytes Relative: 4 %
Neutro Abs: 4.7 10*3/uL (ref 1.7–7.7)
Neutrophils Relative %: 64 %
Platelets: 380 10*3/uL (ref 150–400)
RBC: 2.11 MIL/uL — ABNORMAL LOW (ref 3.87–5.11)
RDW: 21.4 % — ABNORMAL HIGH (ref 11.5–15.5)
WBC: 7.4 10*3/uL (ref 4.0–10.5)
nRBC: 3.1 % — ABNORMAL HIGH (ref 0.0–0.2)

## 2019-08-30 LAB — CMP (CANCER CENTER ONLY)
ALT: 23 U/L (ref 0–44)
AST: 19 U/L (ref 15–41)
Albumin: 4.2 g/dL (ref 3.5–5.0)
Alkaline Phosphatase: 46 U/L (ref 38–126)
Anion gap: 13 (ref 5–15)
BUN: 29 mg/dL — ABNORMAL HIGH (ref 8–23)
CO2: 26 mmol/L (ref 22–32)
Calcium: 8.6 mg/dL — ABNORMAL LOW (ref 8.9–10.3)
Chloride: 104 mmol/L (ref 98–111)
Creatinine: 1.34 mg/dL — ABNORMAL HIGH (ref 0.44–1.00)
GFR, Est AFR Am: 45 mL/min — ABNORMAL LOW (ref >60)
GFR, Estimated: 39 mL/min — ABNORMAL LOW (ref >60)
Glucose, Bld: 145 mg/dL — ABNORMAL HIGH (ref 70–99)
Potassium: 3.6 mmol/L (ref 3.5–5.1)
Sodium: 143 mmol/L (ref 135–145)
Total Bilirubin: 4.1 mg/dL (ref 0.3–1.2)
Total Protein: 6.4 g/dL — ABNORMAL LOW (ref 6.5–8.1)

## 2019-08-30 LAB — SAMPLE TO BLOOD BANK

## 2019-08-30 LAB — LACTATE DEHYDROGENASE: LDH: 560 U/L — ABNORMAL HIGH (ref 98–192)

## 2019-08-30 MED ORDER — ACETAMINOPHEN 325 MG PO TABS
650.0000 mg | ORAL_TABLET | Freq: Once | ORAL | Status: AC
  Administered 2019-08-30: 650 mg via ORAL

## 2019-08-30 MED ORDER — DEXAMETHASONE 4 MG PO TABS: 20.0000 mg | ORAL_TABLET | Freq: Every day | ORAL | 0 refills | Status: AC

## 2019-08-30 MED ORDER — METHYLPREDNISOLONE SODIUM SUCC 125 MG IJ SOLR
125.0000 mg | Freq: Every day | INTRAMUSCULAR | Status: DC
  Administered 2019-08-30: 125 mg via INTRAVENOUS

## 2019-08-30 MED ORDER — ESOMEPRAZOLE MAGNESIUM 40 MG PO CPDR: 40.0000 mg | DELAYED_RELEASE_CAPSULE | Freq: Every day | ORAL | 1 refills | Status: DC

## 2019-08-30 MED ORDER — DIPHENHYDRAMINE HCL 25 MG PO CAPS
50.0000 mg | ORAL_CAPSULE | Freq: Once | ORAL | Status: AC
  Administered 2019-08-30: 14:00:00 50 mg via ORAL

## 2019-08-30 MED ORDER — ACETAMINOPHEN 325 MG PO TABS
ORAL_TABLET | ORAL | Status: AC
  Filled 2019-08-30: qty 2

## 2019-08-30 MED ORDER — METHYLPREDNISOLONE SODIUM SUCC 125 MG IJ SOLR
INTRAMUSCULAR | Status: AC
  Filled 2019-08-30: qty 2

## 2019-08-30 MED ORDER — SODIUM CHLORIDE 0.9 % IV SOLN
Freq: Once | INTRAVENOUS | Status: AC
  Administered 2019-08-30: 14:00:00 via INTRAVENOUS
  Filled 2019-08-30: qty 250

## 2019-08-30 MED ORDER — FAMOTIDINE IN NACL 20-0.9 MG/50ML-% IV SOLN
INTRAVENOUS | Status: AC
  Filled 2019-08-30: qty 50

## 2019-08-30 MED ORDER — SODIUM CHLORIDE 0.9 % IV SOLN
375.0000 mg/m2 | Freq: Once | INTRAVENOUS | Status: AC
  Administered 2019-08-30: 700 mg via INTRAVENOUS
  Filled 2019-08-30: qty 50

## 2019-08-30 MED ORDER — FAMOTIDINE IN NACL 20-0.9 MG/50ML-% IV SOLN
20.0000 mg | Freq: Once | INTRAVENOUS | Status: AC
  Administered 2019-08-30: 20 mg via INTRAVENOUS

## 2019-08-30 MED ORDER — DIPHENHYDRAMINE HCL 25 MG PO CAPS
ORAL_CAPSULE | ORAL | Status: AC
  Filled 2019-08-30: qty 2

## 2019-08-30 NOTE — Progress Notes (Signed)
Verbal order per Dr. Irene Limbo - ok to treat today with Total bili 4.1

## 2019-08-30 NOTE — Progress Notes (Signed)
HEMATOLOGY/ONCOLOGY CLINIC NOTE  Date of Service: 08/30/2019  Patient Care Team: Chesley Noon, MD as PCP - General (Family Medicine) Burtis Junes, NP as PCP - Cardiology (Nurse Practitioner)  CHIEF COMPLAINTS/PURPOSE OF CONSULTATION:  mx of MDS/MPN Toxicity check  HISTORY OF PRESENTING ILLNESS:   Alexandra Pierce is a wonderful 73 y.o. female who has been referred to [place] by Dr. Eliseo Squires for evaluation and management of symptomatic anemia.   Patient has history of hypertension, dyslipidemia, atrial fibrillation on anticoagulation with previous history of CVA x2, obesity who presented with generalized weakness.  Prior to admission she had an episode of blurring of vision especially in the right eye associated with a spell of confusion and possible loss of consciousness for a few seconds associated with disorientation.  She notes that she has also been feeling progressively fatigued for a few weeks and has had difficulty with dyspnea on exertion with short distances. Her daughters insisted she get evaluated and so she came to the emergency room for further evaluation. She does follow-up with cardiology and is being evaluated by pulmonary at the end of the month as well.  Patient previously has been on Coumadin from 2000 but was switched to Xarelto several years ago.  She notes no overt evidence of black stools or bleeding in the stools, no hematuria no nosebleeds no gum bleeds. Notes that she has been generally eating okay. Does endorse new night sweats over the last month or 2. No abdominal pain or significant change in bowel habits.  In the emergency room the patient had labs which showed hemoglobin of 6.6.5 with an MCV of 96.5.  Normal WBC count of 7.2k with elevated platelets of 685k which on repeat were down to 573k. Reticulocyte counts were relatively suppressed. LDH was elevated to 517 but haptoglobin was within normal limits at 45. Coombs negative Myeloma panel showed no M  spike Ferritin level was elevated to 845 suggestive of acute phase reaction/inflammation. B12 and folate within normal limits.  Patient received 2 units of PRBCs with an appropriate bump in her hemoglobin level and felt much better. CT chest abdomen pelvis was done which showed mild splenomegaly without a focal splenic lesion.  Multiple hepatic cysts.  Abnormal hypoenhancement in the right kidney lower pole of undetermined etiology -broad differential based on radiology input. right perirenal stranding is associated with a small amount of fluid in the adjacent right paracolic gutter, and a small fluid collection along the inferior margin of the liver which could be an exophytic cyst, small complex fluid collection, or conceivably a tumor nodule.  No other significant other lymphadenopathy noted.  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 The patient's last visit with [place] was on 08/03/2019. The pt reports that she is doing well overall.  The pt reports she is fatigued all the time.  She has lightheadedness and dizziness sometimes.  She tolerated the first dose or rituxan   She is sleeping well enough  Lab results today (08/30/19) of CBC w/diff and CMP is as follows: all values are WNL except for RBC at 2.11, Hemoglobin at 7.6, HCT at 22.0, MCV at 104.3, MCH at 36.0, RDW at 21.4, nRBC at 3.1, Abs Immature Granulocytes  at 0.14, Glucose Bld at 145, BUN at 29, Creatinine at 1.34, Calcium at 8.6, Total Protein at 6.4, Total bilirubin at 4.1, GFR Est Non Af Am at 39, GFR Est AFR Am at 45 PENDING Sample to blood bank and LDH  On review of systems, pt reports feeling fatigue  and denies leg pain and any  other symptoms.    MEDICAL HISTORY:  Past Medical History:  Diagnosis Date  . Chronic lower back pain   . Hypercholesteremia   . Hypertension    mild  . Obesity   . Paroxysmal atrial fibrillation (HCC)    managed with anticoagulation and rate control.   . Stroke [facility] 2001 & 2015   "right side gets weak sometimes if I'm only tired" (03/16/2016)    SURGICAL HISTORY: Past Surgical History:  Procedure Laterality Date  . CARDIAC CATHETERIZATION  03/31/2000   EF 49%/LAD lg vessel which coursed to the apex & gave rise to 2 diagonal branches/ LAD noted to have 30% ostial lesion & tapers to a sm vessel toward the apex but no high grade lesion/1st diagonal med sized vessel & 2nd diagonal sm vessel with no significant disease/ lt ventriculogram reveals mild global hypokinesis w/ an estimated EF of 45-50%  . CARDIAC CATHETERIZATION N/A 03/17/2016   Procedure: Left Heart Cath and Coronary Angiography;  Surgeon: Wellington Hampshire, MD;  Location: [place] [facility];  Service: Cardiovascular;  Laterality: N/A;  . LEFT HEART CATH AND CORONARY ANGIOGRAPHY N/A 03/31/2018   Procedure: LEFT HEART CATH AND CORONARY ANGIOGRAPHY;  Surgeon: Martinique, Peter M, MD;  Location: [place] [facility];  Service: Cardiovascular;  Laterality: N/A;    SOCIAL HISTORY: Social History   Socioeconomic History  . Marital status: Married    Spouse name: Not on file  . Number of children: 2  . Years of education: Not on file  . Highest education level: Not on file  Occupational History  . Occupation: Herbalist  Social Needs  . Financial resource strain: Not on file  . Food insecurity    Worry: Not on file    Inability: Not on file  . Transportation needs    Medical: Not on file    Non-medical: Not on file  Tobacco Use  . Smoking status: Former Smoker    Packs/day: 0.12    Years: 15.00    Pack years: 1.80    Types: Cigarettes    Quit date: 10/18/1978    Years since quitting: 40.8  . Smokeless tobacco: Never  Used  Substance and Sexual Activity  . Alcohol use: Yes    Alcohol/week: 14.0 standard drinks    Types: 14 Glasses of wine per week  . Drug use: No  . Sexual activity: Yes  Lifestyle  . Physical activity    Days per week: Not on file    Minutes per session: Not on file  . Stress: Not on file  Relationships  . Social Herbalist on phone: Not on file    Gets together: Not on file    Attends religious service: Not on file    Active member of club or organization: Not on file    Attends meetings of clubs or organizations: Not on file    Relationship status: Not on file  . Intimate partner violence    Fear of current or ex partner: Not on file    Emotionally abused: Not on file    Physically abused: Not on file  Forced sexual activity: Not on file  Other Topics Concern  . Not on file  Social History Narrative  . Not on file    FAMILY HISTORY: Family History  Problem Relation Age of Onset  . Heart disease Father   . Asthma Father   . Diabetes Father   . Heart disease Mother   . Heart disease Brother   . Hyperlipidemia Sister   . Hyperlipidemia Brother   . Hyperlipidemia Brother   . Cancer Neg Hx     ALLERGIES:  is allergic to sulfonamide derivatives; lipitor [atorvastatin]; pravachol [pravastatin sodium]; and statins.  MEDICATIONS:  Current Outpatient Medications  Medication Sig Dispense Refill  . acetaminophen (TYLENOL) 500 MG tablet Take 1,000 mg by mouth as needed for mild pain or headache.     . B Complex-C (B-COMPLEX WITH VITAMIN C) tablet Take 1 tablet by mouth daily.    . cholecalciferol (VITAMIN D) 1000 UNITS tablet Take 1,000 Units by mouth daily.    . Cyanocobalamin (B-12) 1000 MCG SUBL Place 1,000 mcg under the tongue daily. 30 each 3  . fenofibrate 160 MG tablet Take 1 tablet (160 mg total) by mouth daily. 90 tablet 3  . folic acid (FOLVITE) 1 MG tablet Take 2 tablets (2 mg total) by mouth daily. 60 tablet 22  . metoprolol tartrate (LOPRESSOR)  25 MG tablet Take 0.5 tablets (12.5 mg total) by mouth 2 (two) times daily. 90 tablet 3  . omeprazole (PRILOSEC) 20 MG capsule TAKE 1 CAPSULE(20 MG) BY MOUTH DAILY 90 capsule 0  . ondansetron (ZOFRAN) 4 MG tablet Take 1 tablet (4 mg total) by mouth every 8 (eight) hours as needed for nausea or vomiting. 30 tablet 0  . predniSONE (DELTASONE) 20 MG tablet Take 3 tablets (60 mg total) by mouth daily with breakfast for 7 days, THEN 2.5 tablets (50 mg total) daily with breakfast for 7 days, THEN 2 tablets (40 mg total) daily with breakfast for 14 days. 70 tablet 0  . XARELTO 20 MG TABS tablet TAKE 1 TABLET BY MOUTH ONCE DAILY 30 tablet 5   No current facility-administered medications for this visit.     REVIEW OF SYSTEMS:   A 10+ POINT REVIEW OF SYSTEMS WAS OBTAINED including neurology, dermatology, psychiatry, cardiac, respiratory, lymph, extremities, GI, GU, Musculoskeletal, constitutional, breasts, reproductive, HEENT.  All pertinent positives are noted in the HPI.  All others are negative.      PHYSICAL EXAMINATION: ECOG FS:1 - Symptomatic but completely ambulatory  Vitals:   08/30/19 1159  BP: 96/61  Pulse: 88  Resp: 18  Temp: 98.3 F (36.8 C)  SpO2: 100%   Wt Readings from Last 3 Encounters:  08/30/19 179 lb 14.4 oz (81.6 kg)  08/29/19 178 lb 6.4 oz (80.9 kg)  08/24/19 182 lb 8 oz (82.8 kg)   Body mass index is 29.94 kg/m.    GENERAL:alert, in no acute distress and comfortable SKIN: no acute rashes, no significant lesions EYES: conjunctiva are pink and non-injected, sclera anicteric OROPHARYNX: MMM, no exudates, no oropharyngeal erythema or ulceration NECK: supple, no JVD LYMPH:  no palpable lymphadenopathy in the cervical, axillary or inguinal regions LUNGS: clear to auscultation b/l with normal respiratory effort HEART: regular rate & rhythm ABDOMEN:  normoactive bowel sounds , non tender, not distended. Extremity: no pedal edema PSYCH: alert & oriented x 3 with fluent  speech NEURO: no focal motor/sensory deficits     LABORATORY DATA:  I have reviewed the data as listed  .  CBC Latest Ref Rng & Units 08/30/2019 08/24/2019 08/16/2019  WBC 4.0 - 10.5 K/uL 7.4 8.4 9.6  Hemoglobin 12.0 - 15.0 g/dL 7.6(L) 8.0(L) 7.2(L)  Hematocrit 36.0 - 46.0 % 22.0(L) 24.2(L) 21.5(L)  Platelets 150 - 400 K/uL 380 317 398    . CMP Latest Ref Rng & Units 08/30/2019 08/24/2019 08/16/2019  Glucose 70 - 99 mg/dL 145(H) 104(H) 150(H)  BUN 8 - 23 mg/dL 29(H) 23 27(H)  Creatinine 0.44 - 1.00 mg/dL 1.34(H) 1.17(H) 1.21(H)  Sodium 135 - 145 mmol/L 143 144 140  Potassium 3.5 - 5.1 mmol/L 3.6 3.4(L) 4.0  Chloride 98 - 111 mmol/L 104 106 105  CO2 22 - 32 mmol/L 26 28 25   Calcium 8.9 - 10.3 mg/dL 8.6(L) 8.8(L) 8.5(L)  Total Protein 6.5 - 8.1 g/dL 6.4(L) 6.3(L) 6.2(L)  Total Bilirubin 0.3 - 1.2 mg/dL 4.1(HH) 3.4(H) 2.8(H)  Alkaline Phos 38 - 126 U/L 46 46 48  AST 15 - 41 U/L 19 14(L) 12(L)  ALT 0 - 44 U/L 23 18 21    05/31/18 Molecular Pathology:   05/24/18 Cytogenetics:     05/24/18 BM Bx:   05/24/18 BM Flow cytometry:     RADIOGRAPHIC STUDIES: I have personally reviewed the radiological images as listed and agreed with the findings in the report. No results found.  ASSESSMENT & PLAN:   73 y.o. female with  1. 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 No overt evidence of bleeding.  In the emergency room the patient had labs which showed hemoglobin of 6.6.5 with an MCV of 96.5.  Normal WBC count of 7.2k with elevated platelets of 685k which on repeat were down to 573k. Reticulocyte counts were relatively suppressed. LDH was elevated to 517 but haptoglobin was within normal limits at 45. Coombs negative Myeloma panel showed no M spike Ferritin level was elevated to 845 suggestive of acute phase reaction/inflammation. B12 and folate within normal limits.  05/24/18 BM Bx results which revealed erythroid and  megakaryocytic proliferation and ring sideroblasts concerning for myeloproliferative/ myelodysplastic neoplasm   No excess blasts; Acute leukemia and lymphoma are not indicated   2. Rt renal radiographic abnormality as noted in CT with some fluid collection. 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 No fevers chills to suggest acute pyelonephritis or renal abscess. Urine analysis showed trace leuk esterase and 6-10 WBCs no significant hematuria. Broad differential diagnosis based on CT findings as per radiologist. Procalcitonin levels unimpressive  05/23/18 CT Chest Abdomen Pelvis was done which showed mild splenomegaly without a focal splenic lesion. Multiple hepatic cysts.  Abnormal hypoenhancement in the right kidney lower pole of undetermined etiology -broad differential based on radiology input. right perirenal stranding is associated with a small amount of fluid in the adjacent right paracolic gutter, and a small fluid collection along the inferior margin of the liver which could be an exophytic cyst, small complex fluid collection, or conceivably a tumor nodule. No other significant other lymphadenopathy noted.   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 Warm Antibody hemolytic Anemia  PLAN:  -Discussed pt labwork today, 08/30/19;  CBC w/diff and CMP is as follows: all values are WNL except for RBC at 2.11, Hemoglobin at 7.6, HCT at 22.0, MCV at 104.3, MCH at 36.0, RDW at 21.4, nRBC at 3.1, Abs Immature Granulocytes at 0.14, Glucose Bld at 145, BUN at 29, Creatinine at 1.34,  Calcium at 8.6, Total Protein at 6.4, Total bilirubin at 4.1, GFR Est Non Af Am at 39, GFR Est AFR Am at 45 -Discussed that her hemoglobin is still low at 7.6 and is contributing to her fatigue. -Advised that it can take the Rituxan up to 8 weeks to take affect. We have the option to add  high dose Dexamethasone in addition to her Prednisone tapre. - Discussed taking dexamethasone instead of prednisone for 4 days. Discussed side effects and adding an acid suppressant  to protect the stomach.   -Transfusion 2 units tomorrow.  FOLLOW UP: Plz schedule 2 units of PRBC's tomorrow Plz schedule next 2 weekly doses of Rituxan with labs and MD Visit  The total time spent in the appt was 25 minutes and more than 50% was on counseling and direct patient cares.  All of the patient's questions were answered with apparent satisfaction. The patient knows to call the clinic with any problems, questions or concerns.   Sullivan Lone MD [place] AAHIVMS [facility] Hematology/Oncology Physician [facility]  (Office):       [phone] (Work cell):  [phone] (Fax):           [phone]  I, Scot Dock, am acting as a scribe for Dr. Sullivan Lone.   .I have reviewed the above documentation for accuracy and completeness, and I agree with the above. Brunetta Genera MD

## 2019-08-30 NOTE — Patient Instructions (Signed)
[facility] Discharge Instructions for Patients Receiving Chemotherapy  Today you received the following chemotherapy agent: Rituxumab  To help prevent nausea and vomiting after your treatment, we encourage you to take your nausea medication as directed by your MD.   If you develop nausea and vomiting that is not controlled by your nausea medication, call the clinic.   BELOW ARE SYMPTOMS THAT SHOULD BE REPORTED IMMEDIATELY:  *FEVER GREATER THAN 100.5 F  *CHILLS WITH OR WITHOUT FEVER  NAUSEA AND VOMITING THAT IS NOT CONTROLLED WITH YOUR NAUSEA MEDICATION  *UNUSUAL SHORTNESS OF BREATH  *UNUSUAL BRUISING OR BLEEDING  TENDERNESS IN MOUTH AND THROAT WITH OR WITHOUT PRESENCE OF ULCERS  *URINARY PROBLEMS  *BOWEL PROBLEMS  UNUSUAL RASH Items with * indicate a potential emergency and should be followed up as soon as possible.  Feel free to call the clinic should you have any questions or concerns. The clinic phone number is [phone].  Rituximab injection What is this medicine? RITUXIMAB (ri TUX i mab) is a monoclonal antibody. It is used to treat certain types of cancer like non-Hodgkin lymphoma and chronic lymphocytic leukemia. It is also used to treat rheumatoid arthritis, granulomatosis with polyangiitis (or Wegener's granulomatosis), microscopic polyangiitis, and pemphigus vulgaris. This medicine may be used for other purposes; ask your health care provider or pharmacist if you have questions. COMMON BRAND NAME(S): Rituxan, RUXIENCE What should I tell my health care provider before I take this medicine? They need to know if you have any of these conditions:  heart disease  infection (especially a virus infection such as hepatitis B, chickenpox, cold sores, or herpes)  immune system problems  irregular heartbeat  kidney disease  low blood counts, like low white cell, platelet, or red cell counts  lung or breathing disease, like asthma  recently  received or scheduled to receive a vaccine  an unusual or allergic reaction to rituximab, other medicines, foods, dyes, or preservatives  pregnant or trying to get pregnant  breast-feeding How should I use this medicine? This medicine is for infusion into a vein. It is administered in a hospital or clinic by a specially trained health care professional. A special MedGuide will be given to you by the pharmacist with each prescription and refill. Be sure to read this information carefully each time. Talk to your pediatrician regarding the use of this medicine in children. This medicine is not approved for use in children. Overdosage: If you think you have taken too much of this medicine contact a poison control center or emergency room at once. NOTE: This medicine is only for you. Do not share this medicine with others. What if I miss a dose? It is important not to miss a dose. Call your doctor or health care professional if you are unable to keep an appointment. What may interact with this medicine?  cisplatin  live virus vaccines This list may not describe all possible interactions. Give your health care provider a list of all the medicines, herbs, non-prescription drugs, or dietary supplements you use. Also tell them if you smoke, drink alcohol, or use illegal drugs. Some items may interact with your medicine. What should I watch for while using this medicine? Your condition will be monitored carefully while you are receiving this medicine. You may need blood work done while you are taking this medicine. This medicine can cause serious allergic reactions. To reduce your risk you may need to take medicine before treatment with this medicine. Take your medicine as  directed. In some patients, this medicine may cause a serious brain infection that may cause death. If you have any problems seeing, thinking, speaking, walking, or standing, tell your healthcare professional right away. If you  cannot reach your healthcare professional, urgently seek other source of medical care. Call your doctor or health care professional for advice if you get a fever, chills or sore throat, or other symptoms of a cold or flu. Do not treat yourself. This drug decreases your body's ability to fight infections. Try to avoid being around people who are sick. Do not become pregnant while taking this medicine or for at least 12 months after stopping it. Women should inform their doctor if they wish to become pregnant or think they might be pregnant. There is a potential for serious side effects to an unborn child. Talk to your health care professional or pharmacist for more information. Do not breast-feed an infant while taking this medicine or for at least 6 months after stopping it. What side effects may I notice from receiving this medicine? Side effects that you should report to your doctor or health care professional as soon as possible:  allergic reactions like skin rash, itching or hives; swelling of the face, lips, or tongue  breathing problems  chest pain  changes in vision  diarrhea  headache with fever, neck stiffness, sensitivity to light, nausea, or confusion  fast, irregular heartbeat  loss of memory  low blood counts - this medicine may decrease the number of white blood cells, red blood cells and platelets. You may be at increased risk for infections and bleeding.  mouth sores  problems with balance, talking, or walking  redness, blistering, peeling or loosening of the skin, including inside the mouth  signs of infection - fever or chills, cough, sore throat, pain or difficulty passing urine  signs and symptoms of kidney injury like trouble passing urine or change in the amount of urine  signs and symptoms of liver injury like dark yellow or brown urine; general ill feeling or flu-like symptoms; light-colored stools; loss of appetite; nausea; right upper belly pain; unusually  weak or tired; yellowing of the eyes or skin  signs and symptoms of low blood pressure like dizziness; feeling faint or lightheaded, falls; unusually weak or tired  stomach pain  swelling of the ankles, feet, hands  unusual bleeding or bruising  vomiting Side effects that usually do not require medical attention (report to your doctor or health care professional if they continue or are bothersome):  headache  joint pain  muscle cramps or muscle pain  nausea  tiredness This list may not describe all possible side effects. Call your doctor for medical advice about side effects. You may report side effects to FDA at 1-800-FDA-1088. Where should I keep my medicine? This drug is given in a hospital or clinic and will not be stored at home. NOTE: This sheet is a summary. It may not cover all possible information. If you have questions about this medicine, talk to your doctor, pharmacist, or health care provider.  2020 Elsevier/Gold Standard (2018-11-15 22:01:36)   Please show the [facility] at check-in to the Emergency Department and triage nurse. Coronavirus (COVID-19) Are you at risk?  Are you at risk for the Coronavirus (COVID-19)?  To be considered HIGH RISK for Coronavirus (COVID-19), you have to meet the following criteria:  . Traveled to [place], [place], [place], [place] or [place]; or in the [place] to [place], [place], [place], or [place]; and have fever, cough, and shortness of breath within the last 2 weeks of travel OR . Been in close contact with a person diagnosed with COVID-19 within the last 2 weeks and have fever, cough, and shortness of breath . IF YOU DO NOT MEET THESE CRITERIA, YOU ARE CONSIDERED LOW RISK FOR COVID-19.  What to do if you are HIGH RISK for COVID-19?  [name] Kitchen If you are having a medical emergency, call 911. . Seek medical care right away. Before you go to a doctor's office, urgent care or emergency department, call ahead and tell  them about your recent travel, contact with someone diagnosed with COVID-19, and your symptoms. You should receive instructions from your physician's office regarding next steps of care.  . When you arrive at healthcare provider, tell the healthcare staff immediately you have returned from visiting [place], [place], [place], [place] or [place]; or traveled in the [place] to [place], [place], [place], or [place]; in the last two weeks or you have been in close contact with a person diagnosed with COVID-19 in the last 2 weeks.   . Tell the health care staff about your symptoms: fever, cough and shortness of breath. . After you have been seen by a medical provider, you will be either: o Tested for (COVID-19) and discharged home on quarantine except to seek medical care if symptoms worsen, and asked to  - Stay home and avoid contact with others until you get your results (4-5 days)  - Avoid travel on public transportation if possible (such as bus, train, or airplane) or o Sent to the Emergency Department by EMS for evaluation, COVID-19 testing, and possible admission depending on your condition and test results.  What to do if you are LOW RISK for COVID-19?  Reduce your risk of any infection by using the same precautions used for avoiding the common cold or flu:  [name] Kitchen Wash your hands often with soap and warm water for at least 20 seconds.  If soap and water are not readily available, use an alcohol-based hand sanitizer with at least 60% alcohol.  . If coughing or sneezing, cover your mouth and nose by coughing or sneezing into the elbow areas of your shirt or coat, into a tissue or into your sleeve (not your hands). . Avoid shaking hands with others and consider head nods or verbal greetings only. . Avoid touching your eyes, nose, or mouth with unwashed hands.  . Avoid close contact with people who are sick. . Avoid places or events with large numbers of people in one location, like concerts or  sporting events. . Carefully consider travel plans you have or are making. . If you are planning any travel outside or inside the [place], visit the CDC's Travelers' Health webpage for the latest health notices. . If you have some symptoms but not all symptoms, continue to monitor at home and seek medical attention if your symptoms worsen. . If you are having a medical emergency, call 911.   [place] / e-Visit: [email or web address]         [facility]: [facility]: [id number]                   [facility]: [phone]

## 2019-08-31 ENCOUNTER — Telehealth: Payer: Self-pay | Admitting: Hematology

## 2019-08-31 ENCOUNTER — Inpatient Hospital Stay: Payer: Medicare Other

## 2019-08-31 ENCOUNTER — Other Ambulatory Visit: Payer: Medicare Other

## 2019-08-31 ENCOUNTER — Other Ambulatory Visit: Payer: Self-pay

## 2019-08-31 DIAGNOSIS — D461 Refractory anemia with ring sideroblasts: Secondary | ICD-10-CM

## 2019-08-31 DIAGNOSIS — Z5112 Encounter for antineoplastic immunotherapy: Secondary | ICD-10-CM | POA: Diagnosis not present

## 2019-08-31 LAB — PREPARE RBC (CROSSMATCH)

## 2019-08-31 MED ORDER — FAMOTIDINE IN NACL 20-0.9 MG/50ML-% IV SOLN
20.0000 mg | Freq: Once | INTRAVENOUS | Status: AC
  Administered 2019-08-31: 20 mg via INTRAVENOUS

## 2019-08-31 MED ORDER — ACETAMINOPHEN 325 MG PO TABS
650.0000 mg | ORAL_TABLET | Freq: Once | ORAL | Status: AC
  Administered 2019-08-31: 650 mg via ORAL

## 2019-08-31 MED ORDER — SODIUM CHLORIDE 0.9% IV SOLUTION
250.0000 mL | Freq: Once | INTRAVENOUS | Status: AC
  Administered 2019-08-31: 250 mL via INTRAVENOUS
  Filled 2019-08-31: qty 250

## 2019-08-31 MED ORDER — DIPHENHYDRAMINE HCL 25 MG PO CAPS
25.0000 mg | ORAL_CAPSULE | Freq: Once | ORAL | Status: AC
  Administered 2019-08-31: 25 mg via ORAL

## 2019-08-31 MED ORDER — METHYLPREDNISOLONE SODIUM SUCC 125 MG IJ SOLR
60.0000 mg | Freq: Once | INTRAMUSCULAR | Status: AC
  Administered 2019-08-31: 60 mg via INTRAVENOUS

## 2019-08-31 MED ORDER — ACETAMINOPHEN 325 MG PO TABS
ORAL_TABLET | ORAL | Status: AC
  Filled 2019-08-31: qty 2

## 2019-08-31 MED ORDER — FAMOTIDINE IN NACL 20-0.9 MG/50ML-% IV SOLN
INTRAVENOUS | Status: AC
  Filled 2019-08-31: qty 50

## 2019-08-31 MED ORDER — METHYLPREDNISOLONE SODIUM SUCC 125 MG IJ SOLR
INTRAMUSCULAR | Status: AC
  Filled 2019-08-31: qty 2

## 2019-08-31 MED ORDER — DIPHENHYDRAMINE HCL 25 MG PO CAPS
ORAL_CAPSULE | ORAL | Status: AC
  Filled 2019-08-31: qty 1

## 2019-08-31 NOTE — Patient Instructions (Signed)

## 2019-08-31 NOTE — Telephone Encounter (Signed)
Scheduled appt per 11/12 los.  Spoke with pt and she is aware of her scheduled appt dates and time.

## 2019-09-01 ENCOUNTER — Other Ambulatory Visit: Payer: Self-pay | Admitting: Hematology

## 2019-09-02 LAB — BPAM RBC
Blood Product Expiration Date: 202011292359
Blood Product Expiration Date: 202012112359
ISSUE DATE / TIME: 202011131406
ISSUE DATE / TIME: 202011131406
Unit Type and Rh: 5100
Unit Type and Rh: 9500

## 2019-09-02 LAB — TYPE AND SCREEN
ABO/RH(D): A POS
Antibody Screen: POSITIVE
Donor AG Type: NEGATIVE
Donor AG Type: NEGATIVE
Unit division: 0
Unit division: 0

## 2019-09-06 ENCOUNTER — Telehealth: Payer: Self-pay | Admitting: *Deleted

## 2019-09-06 ENCOUNTER — Encounter: Payer: Self-pay | Admitting: Hematology

## 2019-09-06 ENCOUNTER — Inpatient Hospital Stay: Payer: Medicare Other

## 2019-09-06 ENCOUNTER — Other Ambulatory Visit: Payer: Self-pay

## 2019-09-06 ENCOUNTER — Other Ambulatory Visit: Payer: Medicare Other

## 2019-09-06 ENCOUNTER — Inpatient Hospital Stay (HOSPITAL_BASED_OUTPATIENT_CLINIC_OR_DEPARTMENT_OTHER): Payer: Medicare Other | Admitting: Hematology

## 2019-09-06 VITALS — BP 93/54 | HR 80 | Temp 98.5°F | Resp 18

## 2019-09-06 VITALS — BP 103/77 | HR 96 | Temp 98.0°F | Resp 18 | Ht 65.0 in | Wt 177.1 lb

## 2019-09-06 DIAGNOSIS — D5911 Warm autoimmune hemolytic anemia: Secondary | ICD-10-CM

## 2019-09-06 DIAGNOSIS — D461 Refractory anemia with ring sideroblasts: Secondary | ICD-10-CM

## 2019-09-06 DIAGNOSIS — E860 Dehydration: Secondary | ICD-10-CM

## 2019-09-06 DIAGNOSIS — Z5112 Encounter for antineoplastic immunotherapy: Secondary | ICD-10-CM | POA: Diagnosis not present

## 2019-09-06 DIAGNOSIS — Z7189 Other specified counseling: Secondary | ICD-10-CM

## 2019-09-06 LAB — CBC WITH DIFFERENTIAL/PLATELET
Abs Immature Granulocytes: 0.13 10*3/uL — ABNORMAL HIGH (ref 0.00–0.07)
Basophils Absolute: 0 10*3/uL (ref 0.0–0.1)
Basophils Relative: 0 %
Eosinophils Absolute: 0.1 10*3/uL (ref 0.0–0.5)
Eosinophils Relative: 1 %
HCT: 23.6 % — ABNORMAL LOW (ref 36.0–46.0)
Hemoglobin: 8.1 g/dL — ABNORMAL LOW (ref 12.0–15.0)
Immature Granulocytes: 2 %
Lymphocytes Relative: 24 %
Lymphs Abs: 2 10*3/uL (ref 0.7–4.0)
MCH: 33.8 pg (ref 26.0–34.0)
MCHC: 34.3 g/dL (ref 30.0–36.0)
MCV: 98.3 fL (ref 80.0–100.0)
Monocytes Absolute: 0.5 10*3/uL (ref 0.1–1.0)
Monocytes Relative: 6 %
Neutro Abs: 5.5 10*3/uL (ref 1.7–7.7)
Neutrophils Relative %: 67 %
Platelets: 419 10*3/uL — ABNORMAL HIGH (ref 150–400)
RBC: 2.4 MIL/uL — ABNORMAL LOW (ref 3.87–5.11)
RDW: 21.3 % — ABNORMAL HIGH (ref 11.5–15.5)
WBC: 8.2 10*3/uL (ref 4.0–10.5)
nRBC: 1.5 % — ABNORMAL HIGH (ref 0.0–0.2)

## 2019-09-06 LAB — CMP (CANCER CENTER ONLY)
ALT: 21 U/L (ref 0–44)
AST: 18 U/L (ref 15–41)
Albumin: 4.2 g/dL (ref 3.5–5.0)
Alkaline Phosphatase: 47 U/L (ref 38–126)
Anion gap: 14 (ref 5–15)
BUN: 43 mg/dL — ABNORMAL HIGH (ref 8–23)
CO2: 21 mmol/L — ABNORMAL LOW (ref 22–32)
Calcium: 8.6 mg/dL — ABNORMAL LOW (ref 8.9–10.3)
Chloride: 105 mmol/L (ref 98–111)
Creatinine: 1.52 mg/dL — ABNORMAL HIGH (ref 0.44–1.00)
GFR, Est AFR Am: 39 mL/min — ABNORMAL LOW (ref >60)
GFR, Estimated: 33 mL/min — ABNORMAL LOW (ref >60)
Glucose, Bld: 172 mg/dL — ABNORMAL HIGH (ref 70–99)
Potassium: 4 mmol/L (ref 3.5–5.1)
Sodium: 140 mmol/L (ref 135–145)
Total Bilirubin: 5.4 mg/dL (ref 0.3–1.2)
Total Protein: 6.2 g/dL — ABNORMAL LOW (ref 6.5–8.1)

## 2019-09-06 LAB — TYPE AND SCREEN
ABO/RH(D): A POS
Antibody Screen: POSITIVE

## 2019-09-06 LAB — RETICULOCYTES
Immature Retic Fract: 29.1 % — ABNORMAL HIGH (ref 2.3–15.9)
RBC.: 2.4 MIL/uL — ABNORMAL LOW (ref 3.87–5.11)
Retic Count, Absolute: 229 10*3/uL — ABNORMAL HIGH (ref 19.0–186.0)
Retic Ct Pct: 9.5 % — ABNORMAL HIGH (ref 0.4–3.1)

## 2019-09-06 LAB — LACTATE DEHYDROGENASE: LDH: 512 U/L — ABNORMAL HIGH (ref 98–192)

## 2019-09-06 MED ORDER — FAMOTIDINE IN NACL 20-0.9 MG/50ML-% IV SOLN
20.0000 mg | Freq: Once | INTRAVENOUS | Status: AC
  Administered 2019-09-06: 20 mg via INTRAVENOUS

## 2019-09-06 MED ORDER — DIPHENHYDRAMINE HCL 25 MG PO CAPS
ORAL_CAPSULE | ORAL | Status: AC
  Filled 2019-09-06: qty 2

## 2019-09-06 MED ORDER — SODIUM CHLORIDE 0.9 % IV SOLN
Freq: Once | INTRAVENOUS | Status: AC
  Administered 2019-09-06: 13:00:00 via INTRAVENOUS
  Filled 2019-09-06: qty 250

## 2019-09-06 MED ORDER — DIPHENHYDRAMINE HCL 25 MG PO CAPS
50.0000 mg | ORAL_CAPSULE | Freq: Once | ORAL | Status: AC
  Administered 2019-09-06: 50 mg via ORAL

## 2019-09-06 MED ORDER — SODIUM CHLORIDE 0.9 % IV SOLN
INTRAVENOUS | Status: AC
  Administered 2019-09-06: 13:00:00 via INTRAVENOUS
  Filled 2019-09-06 (×2): qty 250

## 2019-09-06 MED ORDER — METHYLPREDNISOLONE SODIUM SUCC 125 MG IJ SOLR
125.0000 mg | Freq: Every day | INTRAMUSCULAR | Status: DC
  Administered 2019-09-06: 125 mg via INTRAVENOUS

## 2019-09-06 MED ORDER — ACETAMINOPHEN 325 MG PO TABS
ORAL_TABLET | ORAL | Status: AC
  Filled 2019-09-06: qty 2

## 2019-09-06 MED ORDER — SODIUM CHLORIDE 0.9 % IV SOLN
375.0000 mg/m2 | Freq: Once | INTRAVENOUS | Status: AC
  Administered 2019-09-06: 700 mg via INTRAVENOUS
  Filled 2019-09-06: qty 20

## 2019-09-06 MED ORDER — FAMOTIDINE IN NACL 20-0.9 MG/50ML-% IV SOLN
INTRAVENOUS | Status: AC
  Filled 2019-09-06: qty 50

## 2019-09-06 MED ORDER — METHYLPREDNISOLONE SODIUM SUCC 125 MG IJ SOLR
INTRAMUSCULAR | Status: AC
  Filled 2019-09-06: qty 2

## 2019-09-06 MED ORDER — ACETAMINOPHEN 325 MG PO TABS
650.0000 mg | ORAL_TABLET | Freq: Once | ORAL | Status: AC
  Administered 2019-09-06: 650 mg via ORAL

## 2019-09-06 NOTE — Progress Notes (Signed)
Met with patient at registration to introduce myself as Financial Resource Specialist and to offer available resources. ° °Discussed one-time $700 [facility] grant and qualifications to assist with personal expenses while going through treatment. ° °Gave her my card if interested in applying and for any additional financial questions or concerns. °

## 2019-09-06 NOTE — Telephone Encounter (Signed)
Received report from Diane/Lab that bili 5.4.  Personally informed desk RN, Sandi who will let MD know-Dr Irene Limbo. Pt seeing MD today.

## 2019-09-06 NOTE — Patient Instructions (Signed)
[facility] Discharge Instructions for Patients Receiving Chemotherapy  Today you received the following chemotherapy agent: Rituxumab  To help prevent nausea and vomiting after your treatment, we encourage you to take your nausea medication as directed by your MD.   If you develop nausea and vomiting that is not controlled by your nausea medication, call the clinic.   BELOW ARE SYMPTOMS THAT SHOULD BE REPORTED IMMEDIATELY:  *FEVER GREATER THAN 100.5 F  *CHILLS WITH OR WITHOUT FEVER  NAUSEA AND VOMITING THAT IS NOT CONTROLLED WITH YOUR NAUSEA MEDICATION  *UNUSUAL SHORTNESS OF BREATH  *UNUSUAL BRUISING OR BLEEDING  TENDERNESS IN MOUTH AND THROAT WITH OR WITHOUT PRESENCE OF ULCERS  *URINARY PROBLEMS  *BOWEL PROBLEMS  UNUSUAL RASH Items with * indicate a potential emergency and should be followed up as soon as possible.  Feel free to call the clinic should you have any questions or concerns. The clinic phone number is [phone].  Rituximab injection What is this medicine? RITUXIMAB (ri TUX i mab) is a monoclonal antibody. It is used to treat certain types of cancer like non-Hodgkin lymphoma and chronic lymphocytic leukemia. It is also used to treat rheumatoid arthritis, granulomatosis with polyangiitis (or Wegener's granulomatosis), microscopic polyangiitis, and pemphigus vulgaris. This medicine may be used for other purposes; ask your health care provider or pharmacist if you have questions. COMMON BRAND NAME(S): Rituxan, RUXIENCE What should I tell my health care provider before I take this medicine? They need to know if you have any of these conditions:  heart disease  infection (especially a virus infection such as hepatitis B, chickenpox, cold sores, or herpes)  immune system problems  irregular heartbeat  kidney disease  low blood counts, like low white cell, platelet, or red cell counts  lung or breathing disease, like asthma  recently  received or scheduled to receive a vaccine  an unusual or allergic reaction to rituximab, other medicines, foods, dyes, or preservatives  pregnant or trying to get pregnant  breast-feeding How should I use this medicine? This medicine is for infusion into a vein. It is administered in a hospital or clinic by a specially trained health care professional. A special MedGuide will be given to you by the pharmacist with each prescription and refill. Be sure to read this information carefully each time. Talk to your pediatrician regarding the use of this medicine in children. This medicine is not approved for use in children. Overdosage: If you think you have taken too much of this medicine contact a poison control center or emergency room at once. NOTE: This medicine is only for you. Do not share this medicine with others. What if I miss a dose? It is important not to miss a dose. Call your doctor or health care professional if you are unable to keep an appointment. What may interact with this medicine?  cisplatin  live virus vaccines This list may not describe all possible interactions. Give your health care provider a list of all the medicines, herbs, non-prescription drugs, or dietary supplements you use. Also tell them if you smoke, drink alcohol, or use illegal drugs. Some items may interact with your medicine. What should I watch for while using this medicine? Your condition will be monitored carefully while you are receiving this medicine. You may need blood work done while you are taking this medicine. This medicine can cause serious allergic reactions. To reduce your risk you may need to take medicine before treatment with this medicine. Take your medicine as  directed. In some patients, this medicine may cause a serious brain infection that may cause death. If you have any problems seeing, thinking, speaking, walking, or standing, tell your healthcare professional right away. If you  cannot reach your healthcare professional, urgently seek other source of medical care. Call your doctor or health care professional for advice if you get a fever, chills or sore throat, or other symptoms of a cold or flu. Do not treat yourself. This drug decreases your body's ability to fight infections. Try to avoid being around people who are sick. Do not become pregnant while taking this medicine or for at least 12 months after stopping it. Women should inform their doctor if they wish to become pregnant or think they might be pregnant. There is a potential for serious side effects to an unborn child. Talk to your health care professional or pharmacist for more information. Do not breast-feed an infant while taking this medicine or for at least 6 months after stopping it. What side effects may I notice from receiving this medicine? Side effects that you should report to your doctor or health care professional as soon as possible:  allergic reactions like skin rash, itching or hives; swelling of the face, lips, or tongue  breathing problems  chest pain  changes in vision  diarrhea  headache with fever, neck stiffness, sensitivity to light, nausea, or confusion  fast, irregular heartbeat  loss of memory  low blood counts - this medicine may decrease the number of white blood cells, red blood cells and platelets. You may be at increased risk for infections and bleeding.  mouth sores  problems with balance, talking, or walking  redness, blistering, peeling or loosening of the skin, including inside the mouth  signs of infection - fever or chills, cough, sore throat, pain or difficulty passing urine  signs and symptoms of kidney injury like trouble passing urine or change in the amount of urine  signs and symptoms of liver injury like dark yellow or brown urine; general ill feeling or flu-like symptoms; light-colored stools; loss of appetite; nausea; right upper belly pain; unusually  weak or tired; yellowing of the eyes or skin  signs and symptoms of low blood pressure like dizziness; feeling faint or lightheaded, falls; unusually weak or tired  stomach pain  swelling of the ankles, feet, hands  unusual bleeding or bruising  vomiting Side effects that usually do not require medical attention (report to your doctor or health care professional if they continue or are bothersome):  headache  joint pain  muscle cramps or muscle pain  nausea  tiredness This list may not describe all possible side effects. Call your doctor for medical advice about side effects. You may report side effects to FDA at 1-800-FDA-1088. Where should I keep my medicine? This drug is given in a hospital or clinic and will not be stored at home. NOTE: This sheet is a summary. It may not cover all possible information. If you have questions about this medicine, talk to your doctor, pharmacist, or health care provider.  2020 Elsevier/Gold Standard (2018-11-15 22:01:36)   Please show the [facility] at check-in to the Emergency Department and triage nurse. Coronavirus (COVID-19) Are you at risk?  Are you at risk for the Coronavirus (COVID-19)?  To be considered HIGH RISK for Coronavirus (COVID-19), you have to meet the following criteria:  . Traveled to [place], [place], [place], [place] or [place]; or in the [place] to [place], [place], [place], or [place]; and have fever, cough, and shortness of breath within the last 2 weeks of travel OR . Been in close contact with a person diagnosed with COVID-19 within the last 2 weeks and have fever, cough, and shortness of breath . IF YOU DO NOT MEET THESE CRITERIA, YOU ARE CONSIDERED LOW RISK FOR COVID-19.  What to do if you are HIGH RISK for COVID-19?  [name] Kitchen If you are having a medical emergency, call 911. . Seek medical care right away. Before you go to a doctor's office, urgent care or emergency department, call ahead and tell  them about your recent travel, contact with someone diagnosed with COVID-19, and your symptoms. You should receive instructions from your physician's office regarding next steps of care.  . When you arrive at healthcare provider, tell the healthcare staff immediately you have returned from visiting [place], [place], [place], [place] or [place]; or traveled in the [place] to [place], [place], [place], or [place]; in the last two weeks or you have been in close contact with a person diagnosed with COVID-19 in the last 2 weeks.   . Tell the health care staff about your symptoms: fever, cough and shortness of breath. . After you have been seen by a medical provider, you will be either: o Tested for (COVID-19) and discharged home on quarantine except to seek medical care if symptoms worsen, and asked to  - Stay home and avoid contact with others until you get your results (4-5 days)  - Avoid travel on public transportation if possible (such as bus, train, or airplane) or o Sent to the Emergency Department by EMS for evaluation, COVID-19 testing, and possible admission depending on your condition and test results.  What to do if you are LOW RISK for COVID-19?  Reduce your risk of any infection by using the same precautions used for avoiding the common cold or flu:  [name] Kitchen Wash your hands often with soap and warm water for at least 20 seconds.  If soap and water are not readily available, use an alcohol-based hand sanitizer with at least 60% alcohol.  . If coughing or sneezing, cover your mouth and nose by coughing or sneezing into the elbow areas of your shirt or coat, into a tissue or into your sleeve (not your hands). . Avoid shaking hands with others and consider head nods or verbal greetings only. . Avoid touching your eyes, nose, or mouth with unwashed hands.  . Avoid close contact with people who are sick. . Avoid places or events with large numbers of people in one location, like concerts or  sporting events. . Carefully consider travel plans you have or are making. . If you are planning any travel outside or inside the [place], visit the CDC's Travelers' Health webpage for the latest health notices. . If you have some symptoms but not all symptoms, continue to monitor at home and seek medical attention if your symptoms worsen. . If you are having a medical emergency, call 911.   [place] / e-Visit: [email or web address]         [facility]: [facility]: [id number]                   [facility]: [phone]

## 2019-09-06 NOTE — Progress Notes (Signed)
HEMATOLOGY/ONCOLOGY CLINIC NOTE  Date of Service: 09/06/2019  Patient Care Team: Chesley Noon, MD as PCP - General (Family Medicine) Burtis Junes, NP as PCP - Cardiology (Nurse Practitioner)  CHIEF COMPLAINTS/PURPOSE OF CONSULTATION:  mx of MDS/MPN Toxicity check for Rituxan  HISTORY OF PRESENTING ILLNESS:   Alexandra Pierce is a wonderful 73 y.o. female who has been referred to [place] by Dr. Eliseo Squires for evaluation and management of symptomatic anemia.   Patient has history of hypertension, dyslipidemia, atrial fibrillation on anticoagulation with previous history of CVA x2, obesity who presented with generalized weakness.  Prior to admission she had an episode of blurring of vision especially in the right eye associated with a spell of confusion and possible loss of consciousness for a few seconds associated with disorientation.  She notes that she has also been feeling progressively fatigued for a few weeks and has had difficulty with dyspnea on exertion with short distances. Her daughters insisted she get evaluated and so she came to the emergency room for further evaluation. She does follow-up with cardiology and is being evaluated by pulmonary at the end of the month as well.  Patient previously has been on Coumadin from 2000 but was switched to Xarelto several years ago.  She notes no overt evidence of black stools or bleeding in the stools, no hematuria no nosebleeds no gum bleeds. Notes that she has been generally eating okay. Does endorse new night sweats over the last month or 2. No abdominal pain or significant change in bowel habits.  In the emergency room the patient had labs which showed hemoglobin of 6.6.5 with an MCV of 96.5.  Normal WBC count of 7.2k with elevated platelets of 685k which on repeat were down to 573k. Reticulocyte counts were relatively suppressed. LDH was elevated to 517 but haptoglobin was within normal limits at 45. Coombs negative Myeloma panel  showed no M spike Ferritin level was elevated to 845 suggestive of acute phase reaction/inflammation. B12 and folate within normal limits.  Patient received 2 units of PRBCs with an appropriate bump in her hemoglobin level and felt much better. CT chest abdomen pelvis was done which showed mild splenomegaly without a focal splenic lesion.  Multiple hepatic cysts.  Abnormal hypoenhancement in the right kidney lower pole of undetermined etiology -broad differential based on radiology input. right perirenal stranding is associated with a small amount of fluid in the adjacent right paracolic gutter, and a small fluid collection along the inferior margin of the liver which could be an exophytic cyst, small complex fluid collection, or conceivably a tumor nodule.  No other significant other lymphadenopathy noted.  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 The patient's last visit with [place] was on 08/30/2019. The pt reports that she is doing well overall. Alexandra Pierce  The pt reports she feels week. She did not feel much improvement after treatment.   She has limb weakness. Both arms and left leg.  She is still taking vitamins and prescribed medications   Lab results today (09/06/19) of CBC w/diff and CMP is as follows: all values are WNL except for RBC at 2.40, Hemoglobin at 8.1, HCT at 23.6, RDW at  21.3, Platelets at 419, nRBC at 1.5, CO2 at 21, Glucose Bld at 172, BUN at 43, Creatinine at 1.52, Calcium at 8.6, Total Protein at 6.2, Total bilirubin at 5.4, GFR Est Non Af Am at 33, GFR Est AFR Am at 39 -09/06/2019 LDH at 512.  On review of systems, pt reports fatigue, diarrhea   and denies abdominal pain and any other symptoms.    MEDICAL HISTORY:  Past Medical History:  Diagnosis Date  . Chronic lower back pain   . Hypercholesteremia   . Hypertension    mild  . Obesity   . Paroxysmal atrial fibrillation (HCC)    managed with anticoagulation and rate control.   . Stroke [facility] 2001 & 2015   "right side gets weak sometimes if I'm only tired" (03/16/2016)    SURGICAL HISTORY: Past Surgical History:  Procedure Laterality Date  . CARDIAC CATHETERIZATION  03/31/2000   EF 49%/LAD lg vessel which coursed to the apex & gave rise to 2 diagonal branches/ LAD noted to have 30% ostial lesion & tapers to a sm vessel toward the apex but no high grade lesion/1st diagonal med sized vessel & 2nd diagonal sm vessel with no significant disease/ lt ventriculogram reveals mild global hypokinesis w/ an estimated EF of 45-50%  . CARDIAC CATHETERIZATION N/A 03/17/2016   Procedure: Left Heart Cath and Coronary Angiography;  Surgeon: Wellington Hampshire, MD;  Location: [place] [facility];  Service: Cardiovascular;  Laterality: N/A;  . LEFT HEART CATH AND CORONARY ANGIOGRAPHY N/A 03/31/2018   Procedure: LEFT HEART CATH AND CORONARY ANGIOGRAPHY;  Surgeon: Martinique, Peter M, MD;  Location: [place] [facility];  Service: Cardiovascular;  Laterality: N/A;    SOCIAL HISTORY: Social History   Socioeconomic History  . Marital status: Married    Spouse name: Not on file  . Number of children: 2  . Years of education: Not on file  . Highest education level: Not on file  Occupational History  . Occupation: Herbalist  Social Needs  . Financial resource strain: Not on file  . Food insecurity    Worry: Not on file    Inability: Not on file  . Transportation needs    Medical: Not on file    Non-medical: Not on file  Tobacco Use  . Smoking status: Former Smoker    Packs/day: 0.12    Years: 15.00    Pack years: 1.80    Types: Cigarettes    Quit date: 10/18/1978    Years since quitting:  40.9  . Smokeless tobacco: Never Used  Substance and Sexual Activity  . Alcohol use: Yes    Alcohol/week: 14.0 standard drinks    Types: 14 Glasses of wine per week  . Drug use: No  . Sexual activity: Yes  Lifestyle  . Physical activity    Days per week: Not on file    Minutes per session: Not on file  . Stress: Not on file  Relationships  . Social Herbalist on phone: Not on file    Gets together: Not on file    Attends religious service: Not on file    Active member of club or organization: Not on file    Attends meetings of clubs or organizations: Not on file    Relationship status: Not on file  . Intimate partner violence    Fear of current or ex partner: Not on file    Emotionally abused: Not on file    Physically abused:  Not on file    Forced sexual activity: Not on file  Other Topics Concern  . Not on file  Social History Narrative  . Not on file    FAMILY HISTORY: Family History  Problem Relation Age of Onset  . Heart disease Father   . Asthma Father   . Diabetes Father   . Heart disease Mother   . Heart disease Brother   . Hyperlipidemia Sister   . Hyperlipidemia Brother   . Hyperlipidemia Brother   . Cancer Neg Hx     ALLERGIES:  is allergic to sulfonamide derivatives; lipitor [atorvastatin]; pravachol [pravastatin sodium]; and statins.  MEDICATIONS:  Current Outpatient Medications  Medication Sig Dispense Refill  . acetaminophen (TYLENOL) 500 MG tablet Take 1,000 mg by mouth as needed for mild pain or headache.     . B Complex-C (B-COMPLEX WITH VITAMIN C) tablet Take 1 tablet by mouth daily.    . cholecalciferol (VITAMIN D) 1000 UNITS tablet Take 1,000 Units by mouth daily.    . Cyanocobalamin (B-12) 1000 MCG SUBL Place 1,000 mcg under the tongue daily. 30 each 3  . esomeprazole (NEXIUM) 40 MG capsule TAKE 1 CAPSULE(40 MG) BY MOUTH DAILY BEFORE BREAKFAST 90 capsule 0  . fenofibrate 160 MG tablet Take 1 tablet (160 mg total) by mouth daily.  90 tablet 3  . folic acid (FOLVITE) 1 MG tablet Take 2 tablets (2 mg total) by mouth daily. 60 tablet 22  . metoprolol tartrate (LOPRESSOR) 25 MG tablet Take 0.5 tablets (12.5 mg total) by mouth 2 (two) times daily. 90 tablet 3  . omeprazole (PRILOSEC) 20 MG capsule TAKE 1 CAPSULE(20 MG) BY MOUTH DAILY 90 capsule 0  . ondansetron (ZOFRAN) 4 MG tablet TAKE 1 TABLET(4 MG) BY MOUTH EVERY 8 HOURS AS NEEDED FOR NAUSEA OR VOMITING 30 tablet 0  . predniSONE (DELTASONE) 20 MG tablet Take 3 tablets (60 mg total) by mouth daily with breakfast for 7 days, THEN 2.5 tablets (50 mg total) daily with breakfast for 7 days, THEN 2 tablets (40 mg total) daily with breakfast for 14 days. 70 tablet 0  . XARELTO 20 MG TABS tablet TAKE 1 TABLET BY MOUTH ONCE DAILY 30 tablet 5   No current facility-administered medications for this visit.     REVIEW OF SYSTEMS:   A 10+ POINT REVIEW OF SYSTEMS WAS OBTAINED including neurology, dermatology, psychiatry, cardiac, respiratory, lymph, extremities, GI, GU, Musculoskeletal, constitutional, breasts, reproductive, HEENT.  All pertinent positives are noted in the HPI.  All others are negative.    PHYSICAL EXAMINATION: ECOG FS:2 - Symptomatic, <50% confined to bed  Vitals:   09/06/19 1127  BP: 103/77  Pulse: 96  Resp: 18  Temp: 98 F (36.7 C)  SpO2: 100%   Wt Readings from Last 3 Encounters:  09/06/19 177 lb 1.6 oz (80.3 kg)  08/30/19 179 lb 14.4 oz (81.6 kg)  08/29/19 178 lb 6.4 oz (80.9 kg)   Body mass index is 29.47 kg/m.    GENERAL:alert, in no acute distress and comfortable SKIN: no acute rashes, no significant lesions EYES: conjunctiva are pink and non-injected, sclera anicteric OROPHARYNX: MMM, no exudates, no oropharyngeal erythema or ulceration NECK: supple, no JVD LYMPH:  no palpable lymphadenopathy in the cervical, axillary or inguinal regions LUNGS: clear to auscultation b/l with normal respiratory effort HEART: regular rate & rhythm ABDOMEN:   normoactive bowel sounds , non tender, not distended. Extremity: no pedal edema PSYCH: alert & oriented x 3 with fluent speech  NEURO: no focal motor/sensory deficits     LABORATORY DATA:  I have reviewed the data as listed  . CBC Latest Ref Rng & Units 09/06/2019 08/30/2019 08/24/2019  WBC 4.0 - 10.5 K/uL 8.2 7.4 8.4  Hemoglobin 12.0 - 15.0 g/dL 8.1(L) 7.6(L) 8.0(L)  Hematocrit 36.0 - 46.0 % 23.6(L) 22.0(L) 24.2(L)  Platelets 150 - 400 K/uL 419(H) 380 317    . CMP Latest Ref Rng & Units 09/06/2019 08/30/2019 08/24/2019  Glucose 70 - 99 mg/dL 172(H) 145(H) 104(H)  BUN 8 - 23 mg/dL 43(H) 29(H) 23  Creatinine 0.44 - 1.00 mg/dL 1.52(H) 1.34(H) 1.17(H)  Sodium 135 - 145 mmol/L 140 143 144  Potassium 3.5 - 5.1 mmol/L 4.0 3.6 3.4(L)  Chloride 98 - 111 mmol/L 105 104 106  CO2 22 - 32 mmol/L 21(L) 26 28  Calcium 8.9 - 10.3 mg/dL 8.6(L) 8.6(L) 8.8(L)  Total Protein 6.5 - 8.1 g/dL 6.2(L) 6.4(L) 6.3(L)  Total Bilirubin 0.3 - 1.2 mg/dL 5.4(HH) 4.1(HH) 3.4(H)  Alkaline Phos 38 - 126 U/L 47 46 46  AST 15 - 41 U/L 18 19 14(L)  ALT 0 - 44 U/L 21 23 18    05/31/18 Molecular Pathology:   05/24/18 Cytogenetics:     05/24/18 BM Bx:   05/24/18 BM Flow cytometry:     RADIOGRAPHIC STUDIES: I have personally reviewed the radiological images as listed and agreed with the findings in the report. No results found.  ASSESSMENT & PLAN:   73 y.o. female with  1. 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 No overt evidence of bleeding.  In the emergency room the patient had labs which showed hemoglobin of 6.6.5 with an MCV of 96.5.  Normal WBC count of 7.2k with elevated platelets of 685k which on repeat were down to 573k. Reticulocyte counts were relatively suppressed. LDH was elevated to 517 but haptoglobin was within normal limits at 45. Coombs negative Myeloma panel showed no M spike Ferritin level was elevated to 845 suggestive of acute  phase reaction/inflammation. B12 and folate within normal limits.  05/24/18 BM Bx results which revealed erythroid and megakaryocytic proliferation and ring sideroblasts concerning for myeloproliferative/ myelodysplastic neoplasm   No excess blasts; Acute leukemia and lymphoma are not indicated   2. Rt renal radiographic abnormality as noted in CT with some fluid collection. 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 No fevers chills to suggest acute pyelonephritis or renal abscess. Urine analysis showed trace leuk esterase and 6-10 WBCs no significant hematuria. Broad differential diagnosis based on CT findings as per radiologist. Procalcitonin levels unimpressive  05/23/18 CT Chest Abdomen Pelvis was done which showed mild splenomegaly without a focal splenic lesion. Multiple hepatic cysts.  Abnormal hypoenhancement in the right kidney lower pole of undetermined etiology -broad differential based on radiology input. right perirenal stranding is associated with a small amount of fluid in the adjacent right paracolic gutter, and a small fluid collection along the inferior margin of the liver which could be an exophytic cyst, small complex fluid collection, or conceivably a tumor nodule. No other significant other lymphadenopathy noted.   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 Warm Antibody hemolytic Anemia  PLAN:  -Discussed pt labwork today, 09/06/19; CBC w/diff and CMP is as follows: all values are WNL except for RBC at 2.40, Hemoglobin at 8.1, HCT at 23.6, RDW at 21.3, Platelets at 419, nRBC  at 1.5, CO2 at 21, Glucose Bld at 172, BUN at 43, Creatinine at 1.52, Calcium at 8.6, Total Protein at 6.2, Total bilirubin at 5.4, GFR Est Non Af Am at 33, GFR Est AFR Am at 39 -09/06/2019 LDH at 512  -09/06/2019 WBC at 8.2 -patient tolerated high dose dexamethasone for  4 days. -hgb stable at 8.1 today. -Continue steroid taper with prednisone at 40mg po daily and with a plan to reduce by about 10 mg/dailyevery 1-2weeks. -Discussed that Rituxan takes a few doses before improvement is noted and that peak effect from Rituxan can take 4-8 weeks -Discussed creatinine is higher..Pt is slightly dehydrated. Dehydration contributing to fatigue. Fluids with infusion today. - Discussed with pt that she has a bone marrow issue (production issue) and red cell breakage issue. MDS/MPN and Jak2 positive. -Discussed the option for a referral for a second opinion and possible repeat of bone marrow transplant if anemia not bouncing back.  FOLLOW UP: F/u for next dose of Rituxan in 1 week with labs RTC with Dr Irene Limbo with labs in 2 weeks   The total time spent in the appt was 30 minutes and more than 50% was on counseling and direct patient cares.  All of the patient's questions were answered with apparent satisfaction. The patient knows to call the clinic with any problems, questions or concerns.    Sullivan Lone MD [place] AAHIVMS [facility] Hematology/Oncology Physician [facility]  (Office):       [phone] (Work cell):  [phone] (Fax):           [phone]  I, Scot Dock, am acting as a scribe for Dr. Sullivan Lone.   .I have reviewed the above documentation for accuracy and completeness, and I agree with the above. Brunetta Genera MD

## 2019-09-07 ENCOUNTER — Telehealth: Payer: Self-pay | Admitting: Hematology

## 2019-09-07 NOTE — Telephone Encounter (Signed)
Scheduled appt per 11/19 los. ° °Spoke with pt and they are aware of the appt date and time. °

## 2019-09-14 ENCOUNTER — Inpatient Hospital Stay: Payer: Medicare Other

## 2019-09-14 ENCOUNTER — Telehealth: Payer: Self-pay | Admitting: *Deleted

## 2019-09-14 ENCOUNTER — Other Ambulatory Visit: Payer: Self-pay

## 2019-09-14 ENCOUNTER — Other Ambulatory Visit: Payer: Self-pay | Admitting: *Deleted

## 2019-09-14 VITALS — BP 95/57 | HR 92 | Temp 98.4°F | Resp 16

## 2019-09-14 DIAGNOSIS — M25561 Pain in right knee: Secondary | ICD-10-CM

## 2019-09-14 DIAGNOSIS — D461 Refractory anemia with ring sideroblasts: Secondary | ICD-10-CM

## 2019-09-14 DIAGNOSIS — D5911 Warm autoimmune hemolytic anemia: Secondary | ICD-10-CM

## 2019-09-14 DIAGNOSIS — M25562 Pain in left knee: Secondary | ICD-10-CM

## 2019-09-14 DIAGNOSIS — Z7189 Other specified counseling: Secondary | ICD-10-CM

## 2019-09-14 DIAGNOSIS — Z5112 Encounter for antineoplastic immunotherapy: Secondary | ICD-10-CM | POA: Diagnosis not present

## 2019-09-14 LAB — CBC WITH DIFFERENTIAL/PLATELET
Abs Immature Granulocytes: 0.15 10*3/uL — ABNORMAL HIGH (ref 0.00–0.07)
Basophils Absolute: 0 10*3/uL (ref 0.0–0.1)
Basophils Relative: 0 %
Eosinophils Absolute: 0 10*3/uL (ref 0.0–0.5)
Eosinophils Relative: 0 %
HCT: 19.6 % — ABNORMAL LOW (ref 36.0–46.0)
Hemoglobin: 6.6 g/dL — CL (ref 12.0–15.0)
Immature Granulocytes: 2 %
Lymphocytes Relative: 15 %
Lymphs Abs: 1.2 10*3/uL (ref 0.7–4.0)
MCH: 35.5 pg — ABNORMAL HIGH (ref 26.0–34.0)
MCHC: 33.7 g/dL (ref 30.0–36.0)
MCV: 105.4 fL — ABNORMAL HIGH (ref 80.0–100.0)
Monocytes Absolute: 0.3 10*3/uL (ref 0.1–1.0)
Monocytes Relative: 3 %
Neutro Abs: 5.9 10*3/uL (ref 1.7–7.7)
Neutrophils Relative %: 80 %
Platelets: 390 10*3/uL (ref 150–400)
RBC: 1.86 MIL/uL — ABNORMAL LOW (ref 3.87–5.11)
RDW: 23.5 % — ABNORMAL HIGH (ref 11.5–15.5)
WBC: 7.6 10*3/uL (ref 4.0–10.5)
nRBC: 3.2 % — ABNORMAL HIGH (ref 0.0–0.2)

## 2019-09-14 LAB — CMP (CANCER CENTER ONLY)
ALT: 27 U/L (ref 0–44)
AST: 30 U/L (ref 15–41)
Albumin: 4.1 g/dL (ref 3.5–5.0)
Alkaline Phosphatase: 43 U/L (ref 38–126)
Anion gap: 13 (ref 5–15)
BUN: 26 mg/dL — ABNORMAL HIGH (ref 8–23)
CO2: 22 mmol/L (ref 22–32)
Calcium: 8.5 mg/dL — ABNORMAL LOW (ref 8.9–10.3)
Chloride: 105 mmol/L (ref 98–111)
Creatinine: 1.32 mg/dL — ABNORMAL HIGH (ref 0.44–1.00)
GFR, Est AFR Am: 46 mL/min — ABNORMAL LOW (ref >60)
GFR, Estimated: 40 mL/min — ABNORMAL LOW (ref >60)
Glucose, Bld: 152 mg/dL — ABNORMAL HIGH (ref 70–99)
Potassium: 3.9 mmol/L (ref 3.5–5.1)
Sodium: 140 mmol/L (ref 135–145)
Total Bilirubin: 4.4 mg/dL (ref 0.3–1.2)
Total Protein: 6.4 g/dL — ABNORMAL LOW (ref 6.5–8.1)

## 2019-09-14 LAB — LACTATE DEHYDROGENASE: LDH: 771 U/L — ABNORMAL HIGH (ref 98–192)

## 2019-09-14 MED ORDER — DIPHENHYDRAMINE HCL 25 MG PO CAPS
50.0000 mg | ORAL_CAPSULE | Freq: Once | ORAL | Status: AC
  Administered 2019-09-14: 50 mg via ORAL

## 2019-09-14 MED ORDER — SODIUM CHLORIDE 0.9 % IV SOLN
375.0000 mg/m2 | Freq: Once | INTRAVENOUS | Status: AC
  Administered 2019-09-14: 700 mg via INTRAVENOUS
  Filled 2019-09-14: qty 50

## 2019-09-14 MED ORDER — METHYLPREDNISOLONE SODIUM SUCC 125 MG IJ SOLR
125.0000 mg | Freq: Every day | INTRAMUSCULAR | Status: DC
  Administered 2019-09-14: 125 mg via INTRAVENOUS

## 2019-09-14 MED ORDER — SODIUM CHLORIDE 0.9 % IV SOLN
Freq: Once | INTRAVENOUS | Status: AC
  Administered 2019-09-14: 13:00:00 via INTRAVENOUS
  Filled 2019-09-14: qty 250

## 2019-09-14 MED ORDER — ACETAMINOPHEN 325 MG PO TABS
ORAL_TABLET | ORAL | Status: AC
  Filled 2019-09-14: qty 2

## 2019-09-14 MED ORDER — ACETAMINOPHEN 325 MG PO TABS
650.0000 mg | ORAL_TABLET | Freq: Once | ORAL | Status: AC
  Administered 2019-09-14: 650 mg via ORAL

## 2019-09-14 MED ORDER — FAMOTIDINE IN NACL 20-0.9 MG/50ML-% IV SOLN: 20.0000 mg | Freq: Once | INTRAVENOUS | Status: DC

## 2019-09-14 MED ORDER — FAMOTIDINE IN NACL 20-0.9 MG/50ML-% IV SOLN
INTRAVENOUS | Status: AC
  Filled 2019-09-14: qty 50

## 2019-09-14 MED ORDER — METHYLPREDNISOLONE SODIUM SUCC 125 MG IJ SOLR
INTRAMUSCULAR | Status: AC
  Filled 2019-09-14: qty 2

## 2019-09-14 MED ORDER — DIPHENHYDRAMINE HCL 25 MG PO CAPS
ORAL_CAPSULE | ORAL | Status: AC
  Filled 2019-09-14: qty 2

## 2019-09-14 MED ORDER — FAMOTIDINE IN NACL 20-0.9 MG/50ML-% IV SOLN
20.0000 mg | Freq: Once | INTRAVENOUS | Status: AC
  Administered 2019-09-14: 20 mg via INTRAVENOUS

## 2019-09-14 NOTE — Patient Instructions (Signed)
Rituximab injection What is this medicine? RITUXIMAB (ri TUX i mab) is a monoclonal antibody. It is used to treat certain types of cancer like non-Hodgkin lymphoma and chronic lymphocytic leukemia. It is also used to treat rheumatoid arthritis, granulomatosis with polyangiitis (or Wegener's granulomatosis), microscopic polyangiitis, and pemphigus vulgaris. This medicine may be used for other purposes; ask your health care provider or pharmacist if you have questions. COMMON BRAND NAME(S): Rituxan, RUXIENCE What should I tell my health care provider before I take this medicine? They need to know if you have any of these conditions:  heart disease  infection (especially a virus infection such as hepatitis B, chickenpox, cold sores, or herpes)  immune system problems  irregular heartbeat  kidney disease  low blood counts, like low white cell, platelet, or red cell counts  lung or breathing disease, like asthma  recently received or scheduled to receive a vaccine  an unusual or allergic reaction to rituximab, other medicines, foods, dyes, or preservatives  pregnant or trying to get pregnant  breast-feeding How should I use this medicine? This medicine is for infusion into a vein. It is administered in a hospital or clinic by a specially trained health care professional. A special MedGuide will be given to you by the pharmacist with each prescription and refill. Be sure to read this information carefully each time. Talk to your pediatrician regarding the use of this medicine in children. This medicine is not approved for use in children. Overdosage: If you think you have taken too much of this medicine contact a poison control center or emergency room at once. NOTE: This medicine is only for you. Do not share this medicine with others. What if I miss a dose? It is important not to miss a dose. Call your doctor or health care professional if you are unable to keep an appointment. What  may interact with this medicine?  cisplatin  live virus vaccines This list may not describe all possible interactions. Give your health care provider a list of all the medicines, herbs, non-prescription drugs, or dietary supplements you use. Also tell them if you smoke, drink alcohol, or use illegal drugs. Some items may interact with your medicine. What should I watch for while using this medicine? Your condition will be monitored carefully while you are receiving this medicine. You may need blood work done while you are taking this medicine. This medicine can cause serious allergic reactions. To reduce your risk you may need to take medicine before treatment with this medicine. Take your medicine as directed. In some patients, this medicine may cause a serious brain infection that may cause death. If you have any problems seeing, thinking, speaking, walking, or standing, tell your healthcare professional right away. If you cannot reach your healthcare professional, urgently seek other source of medical care. Call your doctor or health care professional for advice if you get a fever, chills or sore throat, or other symptoms of a cold or flu. Do not treat yourself. This drug decreases your body's ability to fight infections. Try to avoid being around people who are sick. Do not become pregnant while taking this medicine or for at least 12 months after stopping it. Women should inform their doctor if they wish to become pregnant or think they might be pregnant. There is a potential for serious side effects to an unborn child. Talk to your health care professional or pharmacist for more information. Do not breast-feed an infant while taking this medicine or for at   least 6 months after stopping it. What side effects may I notice from receiving this medicine? Side effects that you should report to your doctor or health care professional as soon as possible:  allergic reactions like skin rash, itching or  hives; swelling of the face, lips, or tongue  breathing problems  chest pain  changes in vision  diarrhea  headache with fever, neck stiffness, sensitivity to light, nausea, or confusion  fast, irregular heartbeat  loss of memory  low blood counts - this medicine may decrease the number of white blood cells, red blood cells and platelets. You may be at increased risk for infections and bleeding.  mouth sores  problems with balance, talking, or walking  redness, blistering, peeling or loosening of the skin, including inside the mouth  signs of infection - fever or chills, cough, sore throat, pain or difficulty passing urine  signs and symptoms of kidney injury like trouble passing urine or change in the amount of urine  signs and symptoms of liver injury like dark yellow or brown urine; general ill feeling or flu-like symptoms; light-colored stools; loss of appetite; nausea; right upper belly pain; unusually weak or tired; yellowing of the eyes or skin  signs and symptoms of low blood pressure like dizziness; feeling faint or lightheaded, falls; unusually weak or tired  stomach pain  swelling of the ankles, feet, hands  unusual bleeding or bruising  vomiting Side effects that usually do not require medical attention (report to your doctor or health care professional if they continue or are bothersome):  headache  joint pain  muscle cramps or muscle pain  nausea  tiredness This list may not describe all possible side effects. Call your doctor for medical advice about side effects. You may report side effects to FDA at 1-800-FDA-1088. Where should I keep my medicine? This drug is given in a hospital or clinic and will not be stored at home. NOTE: This sheet is a summary. It may not cover all possible information. If you have questions about this medicine, talk to your doctor, pharmacist, or health care provider.  2020 Elsevier/Gold Standard (2018-11-15  22:01:36)  

## 2019-09-14 NOTE — Progress Notes (Signed)
Pt c/o of bilateral knee pain rating # 8 .  She states that she has this at night mostly.  Reported to DR [facility] & tylenol 650 mg ordered. Order repeated & verified & given.  1640--Pain decreased to # 2-3 post tylenol.

## 2019-09-14 NOTE — Telephone Encounter (Signed)
Notified by Pam in lab - Hgb 6.6. Dr. Alen Blew informed and he ordered 2 units of blood. Scheduled for 11/27 at 8am (unable to secure infusion appt today). Notified by Reva that T. Bili 4.4. Dr. Alen Blew informed, ok to treat. Per Dr. Alen Blew, give premeds as directed by Dr.Kale for last transfusion 08/31/2019

## 2019-09-15 ENCOUNTER — Other Ambulatory Visit: Payer: Self-pay

## 2019-09-15 ENCOUNTER — Other Ambulatory Visit: Payer: Self-pay | Admitting: *Deleted

## 2019-09-15 ENCOUNTER — Inpatient Hospital Stay: Payer: Medicare Other

## 2019-09-15 DIAGNOSIS — D461 Refractory anemia with ring sideroblasts: Secondary | ICD-10-CM

## 2019-09-15 DIAGNOSIS — Z5112 Encounter for antineoplastic immunotherapy: Secondary | ICD-10-CM | POA: Diagnosis not present

## 2019-09-15 DIAGNOSIS — D649 Anemia, unspecified: Secondary | ICD-10-CM

## 2019-09-15 LAB — PREPARE RBC (CROSSMATCH)

## 2019-09-15 MED ORDER — METHYLPREDNISOLONE SODIUM SUCC 125 MG IJ SOLR
60.0000 mg | Freq: Once | INTRAMUSCULAR | Status: AC
  Administered 2019-09-15: 60 mg via INTRAVENOUS

## 2019-09-15 MED ORDER — DIPHENHYDRAMINE HCL 25 MG PO CAPS
25.0000 mg | ORAL_CAPSULE | Freq: Once | ORAL | Status: AC
  Administered 2019-09-15: 08:00:00 25 mg via ORAL

## 2019-09-15 MED ORDER — SODIUM CHLORIDE 0.9% IV SOLUTION
250.0000 mL | Freq: Once | INTRAVENOUS | Status: AC
  Administered 2019-09-15: 250 mL via INTRAVENOUS
  Filled 2019-09-15: qty 250

## 2019-09-15 MED ORDER — ACETAMINOPHEN 325 MG PO TABS
650.0000 mg | ORAL_TABLET | Freq: Once | ORAL | Status: AC
  Administered 2019-09-15: 650 mg via ORAL

## 2019-09-15 MED ORDER — METHYLPREDNISOLONE SODIUM SUCC 125 MG IJ SOLR
INTRAMUSCULAR | Status: AC
  Filled 2019-09-15: qty 2

## 2019-09-15 MED ORDER — DIPHENHYDRAMINE HCL 25 MG PO CAPS
ORAL_CAPSULE | ORAL | Status: AC
  Filled 2019-09-15: qty 1

## 2019-09-15 MED ORDER — ACETAMINOPHEN 325 MG PO TABS
ORAL_TABLET | ORAL | Status: AC
  Filled 2019-09-15: qty 2

## 2019-09-15 NOTE — Patient Instructions (Signed)
Blood Transfusion, Adult, Care After This sheet gives you information about how to care for yourself after your procedure. Your doctor may also give you more specific instructions. If you have problems or questions, contact your doctor. Follow these instructions at home:   Take over-the-counter and prescription medicines only as told by your doctor.  Go back to your normal activities as told by your doctor.  Follow instructions from your doctor about how to take care of the area where an IV tube was put into your vein (insertion site). Make sure you: ? Wash your hands with soap and water before you change your bandage (dressing). If there is no soap and water, use hand sanitizer. ? Change your bandage as told by your doctor.  Check your IV insertion site every day for signs of infection. Check for: ? More redness, swelling, or pain. ? More fluid or blood. ? Warmth. ? Pus or a bad smell. Contact a doctor if:  You have more redness, swelling, or pain around the IV insertion site.  You have more fluid or blood coming from the IV insertion site.  Your IV insertion site feels warm to the touch.  You have pus or a bad smell coming from the IV insertion site.  Your pee (urine) turns pink, red, or brown.  You feel weak after doing your normal activities. Get help right away if:  You have signs of a serious allergic or body defense (immune) system reaction, including: ? Itchiness. ? Hives. ? Trouble breathing. ? Anxiety. ? Pain in your chest or lower back. ? Fever, flushing, and chills. ? Fast pulse. ? Rash. ? Watery poop (diarrhea). ? Throwing up (vomiting). ? Dark pee. ? Serious headache. ? Dizziness. ? Stiff neck. ? Yellow color in your face or the white parts of your eyes (jaundice). Summary  After a blood transfusion, return to your normal activities as told by your doctor.  Every day, check for signs of infection where the IV tube was put into your vein.  Some  signs of infection are warm skin, more redness and pain, more fluid or blood, and pus or a bad smell where the needle went in.  Contact your doctor if you feel weak or have any unusual symptoms. This information is not intended to replace advice given to you by your health care provider. Make sure you discuss any questions you have with your health care provider. Document Released: 10/25/2014 Document Revised: 02/08/2018 Document Reviewed: 05/28/2016 Elsevier Patient Education  2020 [employer]  

## 2019-09-16 ENCOUNTER — Telehealth: Payer: Self-pay | Admitting: Hematology

## 2019-09-16 ENCOUNTER — Inpatient Hospital Stay: Payer: Medicare Other

## 2019-09-16 DIAGNOSIS — Z5112 Encounter for antineoplastic immunotherapy: Secondary | ICD-10-CM | POA: Diagnosis not present

## 2019-09-16 DIAGNOSIS — D649 Anemia, unspecified: Secondary | ICD-10-CM

## 2019-09-16 MED ORDER — DIPHENHYDRAMINE HCL 25 MG PO CAPS
ORAL_CAPSULE | ORAL | Status: AC
  Filled 2019-09-16: qty 1

## 2019-09-16 MED ORDER — ACETAMINOPHEN 325 MG PO TABS
650.0000 mg | ORAL_TABLET | Freq: Once | ORAL | Status: AC
  Administered 2019-09-16: 650 mg via ORAL

## 2019-09-16 MED ORDER — ACETAMINOPHEN 325 MG PO TABS
ORAL_TABLET | ORAL | Status: AC
  Filled 2019-09-16: qty 2

## 2019-09-16 MED ORDER — METHYLPREDNISOLONE SODIUM SUCC 125 MG IJ SOLR
INTRAMUSCULAR | Status: AC
  Filled 2019-09-16: qty 2

## 2019-09-16 MED ORDER — METHYLPREDNISOLONE SODIUM SUCC 125 MG IJ SOLR
60.0000 mg | Freq: Once | INTRAMUSCULAR | Status: AC
  Administered 2019-09-16: 09:00:00 60 mg via INTRAVENOUS

## 2019-09-16 MED ORDER — DIPHENHYDRAMINE HCL 25 MG PO CAPS
25.0000 mg | ORAL_CAPSULE | Freq: Once | ORAL | Status: AC
  Administered 2019-09-16: 08:00:00 25 mg via ORAL

## 2019-09-16 MED ORDER — SODIUM CHLORIDE 0.9% IV SOLUTION
250.0000 mL | Freq: Once | INTRAVENOUS | Status: AC
  Administered 2019-09-16: 250 mL via INTRAVENOUS
  Filled 2019-09-16: qty 250

## 2019-09-16 NOTE — Telephone Encounter (Signed)
Scheduled appt per RN (Thu) request for 11/29.

## 2019-09-16 NOTE — Patient Instructions (Signed)
Blood Transfusion, Adult, Care After This sheet gives you information about how to care for yourself after your procedure. Your doctor may also give you more specific instructions. If you have problems or questions, contact your doctor. Follow these instructions at home:   Take over-the-counter and prescription medicines only as told by your doctor.  Go back to your normal activities as told by your doctor.  Follow instructions from your doctor about how to take care of the area where an IV tube was put into your vein (insertion site). Make sure you: ? Wash your hands with soap and water before you change your bandage (dressing). If there is no soap and water, use hand sanitizer. ? Change your bandage as told by your doctor.  Check your IV insertion site every day for signs of infection. Check for: ? More redness, swelling, or pain. ? More fluid or blood. ? Warmth. ? Pus or a bad smell. Contact a doctor if:  You have more redness, swelling, or pain around the IV insertion site.  You have more fluid or blood coming from the IV insertion site.  Your IV insertion site feels warm to the touch.  You have pus or a bad smell coming from the IV insertion site.  Your pee (urine) turns pink, red, or brown.  You feel weak after doing your normal activities. Get help right away if:  You have signs of a serious allergic or body defense (immune) system reaction, including: ? Itchiness. ? Hives. ? Trouble breathing. ? Anxiety. ? Pain in your chest or lower back. ? Fever, flushing, and chills. ? Fast pulse. ? Rash. ? Watery poop (diarrhea). ? Throwing up (vomiting). ? Dark pee. ? Serious headache. ? Dizziness. ? Stiff neck. ? Yellow color in your face or the white parts of your eyes (jaundice). Summary  After a blood transfusion, return to your normal activities as told by your doctor.  Every day, check for signs of infection where the IV tube was put into your vein.  Some  signs of infection are warm skin, more redness and pain, more fluid or blood, and pus or a bad smell where the needle went in.  Contact your doctor if you feel weak or have any unusual symptoms. This information is not intended to replace advice given to you by your health care provider. Make sure you discuss any questions you have with your health care provider. Document Released: 10/25/2014 Document Revised: 02/08/2018 Document Reviewed: 05/28/2016 Elsevier Patient Education  2020 [employer]  

## 2019-09-17 ENCOUNTER — Ambulatory Visit: Payer: Medicare Other

## 2019-09-17 LAB — TYPE AND SCREEN
ABO/RH(D): A POS
Antibody Screen: POSITIVE
Donor AG Type: NEGATIVE
Donor AG Type: NEGATIVE
Unit division: 0
Unit division: 0

## 2019-09-17 LAB — BPAM RBC
Blood Product Expiration Date: 202012292359
Blood Product Expiration Date: 202101032359
ISSUE DATE / TIME: 202011281316
ISSUE DATE / TIME: 202011290902
Unit Type and Rh: 6200
Unit Type and Rh: 6200

## 2019-09-20 ENCOUNTER — Inpatient Hospital Stay: Payer: Medicare Other | Attending: Hematology

## 2019-09-20 ENCOUNTER — Inpatient Hospital Stay: Payer: Medicare Other | Admitting: Hematology

## 2019-09-20 ENCOUNTER — Other Ambulatory Visit: Payer: Self-pay

## 2019-09-20 VITALS — BP 115/74 | HR 101 | Temp 97.9°F | Resp 18 | Ht 65.0 in | Wt 179.5 lb

## 2019-09-20 DIAGNOSIS — R531 Weakness: Secondary | ICD-10-CM | POA: Insufficient documentation

## 2019-09-20 DIAGNOSIS — I10 Essential (primary) hypertension: Secondary | ICD-10-CM | POA: Insufficient documentation

## 2019-09-20 DIAGNOSIS — D5911 Warm autoimmune hemolytic anemia: Secondary | ICD-10-CM | POA: Diagnosis not present

## 2019-09-20 DIAGNOSIS — D649 Anemia, unspecified: Secondary | ICD-10-CM | POA: Diagnosis not present

## 2019-09-20 DIAGNOSIS — I480 Paroxysmal atrial fibrillation: Secondary | ICD-10-CM | POA: Insufficient documentation

## 2019-09-20 DIAGNOSIS — D461 Refractory anemia with ring sideroblasts: Secondary | ICD-10-CM | POA: Diagnosis not present

## 2019-09-20 DIAGNOSIS — D473 Essential (hemorrhagic) thrombocythemia: Secondary | ICD-10-CM | POA: Diagnosis present

## 2019-09-20 DIAGNOSIS — D471 Chronic myeloproliferative disease: Secondary | ICD-10-CM

## 2019-09-20 DIAGNOSIS — Z7901 Long term (current) use of anticoagulants: Secondary | ICD-10-CM | POA: Diagnosis not present

## 2019-09-20 DIAGNOSIS — E669 Obesity, unspecified: Secondary | ICD-10-CM | POA: Insufficient documentation

## 2019-09-20 DIAGNOSIS — Z8673 Personal history of transient ischemic attack (TIA), and cerebral infarction without residual deficits: Secondary | ICD-10-CM | POA: Insufficient documentation

## 2019-09-20 DIAGNOSIS — E785 Hyperlipidemia, unspecified: Secondary | ICD-10-CM | POA: Insufficient documentation

## 2019-09-20 LAB — CBC WITH DIFFERENTIAL/PLATELET
Abs Immature Granulocytes: 0.06 10*3/uL (ref 0.00–0.07)
Basophils Absolute: 0 10*3/uL (ref 0.0–0.1)
Basophils Relative: 0 %
Eosinophils Absolute: 0.1 10*3/uL (ref 0.0–0.5)
Eosinophils Relative: 1 %
HCT: 25.2 % — ABNORMAL LOW (ref 36.0–46.0)
Hemoglobin: 8.2 g/dL — ABNORMAL LOW (ref 12.0–15.0)
Immature Granulocytes: 1 %
Lymphocytes Relative: 36 %
Lymphs Abs: 1.8 10*3/uL (ref 0.7–4.0)
MCH: 32.7 pg (ref 26.0–34.0)
MCHC: 32.5 g/dL (ref 30.0–36.0)
MCV: 100.4 fL — ABNORMAL HIGH (ref 80.0–100.0)
Monocytes Absolute: 0.2 10*3/uL (ref 0.1–1.0)
Monocytes Relative: 4 %
Neutro Abs: 2.9 10*3/uL (ref 1.7–7.7)
Neutrophils Relative %: 58 %
Platelets: 295 10*3/uL (ref 150–400)
RBC: 2.51 MIL/uL — ABNORMAL LOW (ref 3.87–5.11)
RDW: 20.5 % — ABNORMAL HIGH (ref 11.5–15.5)
WBC: 5.1 10*3/uL (ref 4.0–10.5)
nRBC: 1 % — ABNORMAL HIGH (ref 0.0–0.2)

## 2019-09-20 LAB — CMP (CANCER CENTER ONLY)
ALT: 30 U/L (ref 0–44)
AST: 17 U/L (ref 15–41)
Albumin: 4 g/dL (ref 3.5–5.0)
Alkaline Phosphatase: 38 U/L (ref 38–126)
Anion gap: 9 (ref 5–15)
BUN: 25 mg/dL — ABNORMAL HIGH (ref 8–23)
CO2: 28 mmol/L (ref 22–32)
Calcium: 8.3 mg/dL — ABNORMAL LOW (ref 8.9–10.3)
Chloride: 105 mmol/L (ref 98–111)
Creatinine: 0.93 mg/dL (ref 0.44–1.00)
GFR, Est AFR Am: >60 mL/min (ref >60)
GFR, Estimated: >60 mL/min (ref >60)
Glucose, Bld: 104 mg/dL — ABNORMAL HIGH (ref 70–99)
Potassium: 3.8 mmol/L (ref 3.5–5.1)
Sodium: 142 mmol/L (ref 135–145)
Total Bilirubin: 3.5 mg/dL — ABNORMAL HIGH (ref 0.3–1.2)
Total Protein: 6.1 g/dL — ABNORMAL LOW (ref 6.5–8.1)

## 2019-09-20 LAB — LACTATE DEHYDROGENASE: LDH: 583 U/L — ABNORMAL HIGH (ref 98–192)

## 2019-09-20 LAB — SAMPLE TO BLOOD BANK

## 2019-09-20 NOTE — Progress Notes (Signed)
HEMATOLOGY/ONCOLOGY CLINIC NOTE  Date of Service: 09/20/2019  Patient Care Team: Chesley Noon, MD as PCP - General (Family Medicine) Burtis Junes, NP as PCP - Cardiology (Nurse Practitioner)  CHIEF COMPLAINTS/PURPOSE OF CONSULTATION:  mx of MDS/MPN Toxicity check for Rituxan  HISTORY OF PRESENTING ILLNESS:   Alexandra Pierce is a wonderful 73 y.o. female who has been referred to [place] by Dr. Eliseo Squires for evaluation and management of symptomatic anemia.   Patient has history of hypertension, dyslipidemia, atrial fibrillation on anticoagulation with previous history of CVA x2, obesity who presented with generalized weakness.  Prior to admission she had an episode of blurring of vision especially in the right eye associated with a spell of confusion and possible loss of consciousness for a few seconds associated with disorientation.  She notes that she has also been feeling progressively fatigued for a few weeks and has had difficulty with dyspnea on exertion with short distances. Her daughters insisted she get evaluated and so she came to the emergency room for further evaluation. She does follow-up with cardiology and is being evaluated by pulmonary at the end of the month as well.  Patient previously has been on Coumadin from 2000 but was switched to Xarelto several years ago.  She notes no overt evidence of black stools or bleeding in the stools, no hematuria no nosebleeds no gum bleeds. Notes that she has been generally eating okay. Does endorse new night sweats over the last month or 2. No abdominal pain or significant change in bowel habits.  In the emergency room the patient had labs which showed hemoglobin of 6.6.5 with an MCV of 96.5.  Normal WBC count of 7.2k with elevated platelets of 685k which on repeat were down to 573k. Reticulocyte counts were relatively suppressed. LDH was elevated to 517 but haptoglobin was within normal limits at 45. Coombs negative Myeloma panel  showed no M spike Ferritin level was elevated to 845 suggestive of acute phase reaction/inflammation. B12 and folate within normal limits.  Patient received 2 units of PRBCs with an appropriate bump in her hemoglobin level and felt much better. CT chest abdomen pelvis was done which showed mild splenomegaly without a focal splenic lesion.  Multiple hepatic cysts.  Abnormal hypoenhancement in the right kidney lower pole of undetermined etiology -broad differential based on radiology input. right perirenal stranding is associated with a small amount of fluid in the adjacent right paracolic gutter, and a small fluid collection along the inferior margin of the liver which could be an exophytic cyst, small complex fluid collection, or conceivably a tumor nodule.  No other significant other lymphadenopathy noted.  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 The patient's last visit with [place] was on 09/06/2019. The pt reports that she is doing well overall.  The pt reports she feels better after blood transfusion  She is taking 74m po daily of prednisone.  Her muscle fatigue is the same as before. "legs feel like rubber."  The more she walks the better she feels. It improves when she has blood transfusion.   Lab results today (09/20/19) of CBC w/diff and CMP is as follows: all values are WNL except  for WBC at 2.51, Hemoglobin at 8.2, HCT at 25.2, MCV at 100.4, RDW at 20.5, nRBC 1.0, Glucose Bld at 104, UN at 25, Calcium at 8.3, Total Protein at 6.1, Total Bilirubin at 3.5, LDH at 583  PENDING sample to blood bank.  On review of systems, pt reports improvement in fatigue  after blood transfusion, stable appetite and denies abdominal pain, leg swelling, skin rashes, new symptoms, back pain, leg swelling  and any other symptoms.    MEDICAL HISTORY:  Past Medical History:  Diagnosis Date  . Chronic lower back pain   . Hypercholesteremia   . Hypertension    mild  . Obesity   . Paroxysmal atrial fibrillation (HCC)    managed with anticoagulation and rate control.   . Stroke [facility]) 2001 & 2015   "right side gets weak sometimes if I'm only tired" (03/16/2016)    SURGICAL HISTORY: Past Surgical History:  Procedure Laterality Date  . CARDIAC CATHETERIZATION  03/31/2000   EF 49%/LAD lg vessel which coursed to the apex & gave rise to 2 diagonal branches/ LAD noted to have 30% ostial lesion & tapers to a sm vessel toward the apex but no high grade lesion/1st diagonal med sized vessel & 2nd diagonal sm vessel with no significant disease/ lt ventriculogram reveals mild global hypokinesis w/ an estimated EF of 45-50%  . CARDIAC CATHETERIZATION N/A 03/17/2016   Procedure: Left Heart Cath and Coronary Angiography;  Surgeon: Wellington Hampshire, MD;  Location: [facility];  Service: Cardiovascular;  Laterality: N/A;  . LEFT HEART CATH AND CORONARY ANGIOGRAPHY N/A 03/31/2018   Procedure: LEFT HEART CATH AND CORONARY ANGIOGRAPHY;  Surgeon: Martinique, Peter M, MD;  Location: [facility];  Service: Cardiovascular;  Laterality: N/A;    SOCIAL HISTORY: Social History   Socioeconomic History  . Marital status: Married    Spouse name: Not on file  . Number of children: 2  . Years of education: Not on file  . Highest education level: Not on file  Occupational History  . Occupation: Herbalist  Social Needs  . Financial resource strain: Not on file  . Food insecurity    Worry: Not on file    Inability: Not on file  . Transportation needs    Medical: Not on file    Non-medical: Not on file  Tobacco Use  . Smoking status: Former Smoker    Packs/day: 0.12     Years: 15.00    Pack years: 1.80    Types: Cigarettes    Quit date: 10/18/1978    Years since quitting: 40.9  . Smokeless tobacco: Never Used  Substance and Sexual Activity  . Alcohol use: Yes    Alcohol/week: 14.0 standard drinks    Types: 14 Glasses of wine per week  . Drug use: No  . Sexual activity: Yes  Lifestyle  . Physical activity    Days per week: Not on file    Minutes per session: Not on file  . Stress: Not on file  Relationships  . Social Herbalist on phone: Not on file    Gets together: Not on file    Attends religious service: Not on file    Active member of club or organization: Not on file    Attends meetings of clubs or organizations: Not on file    Relationship status: Not on file  . Intimate partner violence    Fear of current or ex partner: Not on file  Emotionally abused: Not on file    Physically abused: Not on file    Forced sexual activity: Not on file  Other Topics Concern  . Not on file  Social History Narrative  . Not on file    FAMILY HISTORY: Family History  Problem Relation Age of Onset  . Heart disease Father   . Asthma Father   . Diabetes Father   . Heart disease Mother   . Heart disease Brother   . Hyperlipidemia Sister   . Hyperlipidemia Brother   . Hyperlipidemia Brother   . Cancer Neg Hx     ALLERGIES:  is allergic to sulfonamide derivatives; lipitor [atorvastatin]; pravachol [pravastatin sodium]; and statins.  MEDICATIONS:  Current Outpatient Medications  Medication Sig Dispense Refill  . acetaminophen (TYLENOL) 500 MG tablet Take 1,000 mg by mouth as needed for mild pain or headache.     . B Complex-C (B-COMPLEX WITH VITAMIN C) tablet Take 1 tablet by mouth daily.    . cholecalciferol (VITAMIN D) 1000 UNITS tablet Take 1,000 Units by mouth daily.    . Cyanocobalamin (B-12) 1000 MCG SUBL Place 1,000 mcg under the tongue daily. 30 each 3  . esomeprazole (NEXIUM) 40 MG capsule TAKE 1 CAPSULE(40 MG) BY MOUTH  DAILY BEFORE BREAKFAST 90 capsule 0  . fenofibrate 160 MG tablet Take 1 tablet (160 mg total) by mouth daily. 90 tablet 3  . folic acid (FOLVITE) 1 MG tablet Take 2 tablets (2 mg total) by mouth daily. 60 tablet 22  . metoprolol tartrate (LOPRESSOR) 25 MG tablet Take 0.5 tablets (12.5 mg total) by mouth 2 (two) times daily. 90 tablet 3  . omeprazole (PRILOSEC) 20 MG capsule TAKE 1 CAPSULE(20 MG) BY MOUTH DAILY 90 capsule 0  . ondansetron (ZOFRAN) 4 MG tablet TAKE 1 TABLET(4 MG) BY MOUTH EVERY 8 HOURS AS NEEDED FOR NAUSEA OR VOMITING 30 tablet 0  . XARELTO 20 MG TABS tablet TAKE 1 TABLET BY MOUTH ONCE DAILY 30 tablet 5   No current facility-administered medications for this visit.     REVIEW OF SYSTEMS:   A 10+ POINT REVIEW OF SYSTEMS WAS OBTAINED including neurology, dermatology, psychiatry, cardiac, respiratory, lymph, extremities, GI, GU, Musculoskeletal, constitutional, breasts, reproductive, HEENT.  All pertinent positives are noted in the HPI.  All others are negative.      PHYSICAL EXAMINATION: ECOG FS:1 - Symptomatic but completely ambulatory  Vitals:   09/20/19 1125  BP: 115/74  Pulse: (!) 101  Resp: 18  Temp: 97.9 F (36.6 C)  SpO2: 100%   Wt Readings from Last 3 Encounters:  09/20/19 179 lb 8 oz (81.4 kg)  09/06/19 177 lb 1.6 oz (80.3 kg)  08/30/19 179 lb 14.4 oz (81.6 kg)   Body mass index is 29.87 kg/m.    GENERAL:alert, in no acute distress and comfortable SKIN: no acute rashes, no significant lesions EYES: conjunctiva are pink and non-injected, sclera anicteric OROPHARYNX: MMM, no exudates, no oropharyngeal erythema or ulceration NECK: supple, no JVD LYMPH:  no palpable lymphadenopathy in the cervical, axillary or inguinal regions LUNGS: clear to auscultation b/l with normal respiratory effort HEART: regular rate & rhythm ABDOMEN:  normoactive bowel sounds , non tender, not distended. Extremity: no pedal edema PSYCH: alert & oriented x 3 with fluent speech  NEURO: no focal motor/sensory deficits     LABORATORY DATA:  I have reviewed the data as listed  . CBC Latest Ref Rng & Units 09/20/2019 09/14/2019 09/06/2019  WBC 4.0 - 10.5 K/uL  5.1 7.6 8.2  Hemoglobin 12.0 - 15.0 g/dL 8.2(L) 6.6(LL) 8.1(L)  Hematocrit 36.0 - 46.0 % 25.2(L) 19.6(L) 23.6(L)  Platelets 150 - 400 K/uL 295 390 419(H)    . CMP Latest Ref Rng & Units 09/20/2019 09/14/2019 09/06/2019  Glucose 70 - 99 mg/dL 104(H) 152(H) 172(H)  BUN 8 - 23 mg/dL 25(H) 26(H) 43(H)  Creatinine 0.44 - 1.00 mg/dL 0.93 1.32(H) 1.52(H)  Sodium 135 - 145 mmol/L 142 140 140  Potassium 3.5 - 5.1 mmol/L 3.8 3.9 4.0  Chloride 98 - 111 mmol/L 105 105 105  CO2 22 - 32 mmol/L 28 22 21(L)  Calcium 8.9 - 10.3 mg/dL 8.3(L) 8.5(L) 8.6(L)  Total Protein 6.5 - 8.1 g/dL 6.1(L) 6.4(L) 6.2(L)  Total Bilirubin 0.3 - 1.2 mg/dL 3.5(H) 4.4(HH) 5.4(HH)  Alkaline Phos 38 - 126 U/L 38 43 47  AST 15 - 41 U/L '17 30 18  ' ALT 0 - 44 U/L '30 27 21   ' 05/31/18 Molecular Pathology:   05/24/18 Cytogenetics:     05/24/18 BM Bx:   05/24/18 BM Flow cytometry:     RADIOGRAPHIC STUDIES: I have personally reviewed the radiological images as listed and agreed with the findings in the report. No results found.  ASSESSMENT & PLAN:   73 y.o. female with  1. 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 No overt evidence of bleeding.  In the emergency room the patient had labs which showed hemoglobin of 6.6.5 with an MCV of 96.5.  Normal WBC count of 7.2k with elevated platelets of 685k which on repeat were down to 573k. Reticulocyte counts were relatively suppressed. LDH was elevated to 517 but haptoglobin was within normal limits at 45. Coombs negative Myeloma panel showed no M spike Ferritin level was elevated to 845 suggestive of acute phase reaction/inflammation. B12 and folate within normal limits.  05/24/18 BM Bx results which revealed erythroid and  megakaryocytic proliferation and ring sideroblasts concerning for myeloproliferative/ myelodysplastic neoplasm   No excess blasts; Acute leukemia and lymphoma are not indicated   2. Rt renal radiographic abnormality as noted in CT with some fluid collection. 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 No fevers chills to suggest acute pyelonephritis or renal abscess. Urine analysis showed trace leuk esterase and 6-10 WBCs no significant hematuria. Broad differential diagnosis based on CT findings as per radiologist. Procalcitonin levels unimpressive  05/23/18 CT Chest Abdomen Pelvis was done which showed mild splenomegaly without a focal splenic lesion. Multiple hepatic cysts.  Abnormal hypoenhancement in the right kidney lower pole of undetermined etiology -broad differential based on radiology input. right perirenal stranding is associated with a small amount of fluid in the adjacent right paracolic gutter, and a small fluid collection along the inferior margin of the liver which could be an exophytic cyst, small complex fluid collection, or conceivably a tumor nodule. No other significant other lymphadenopathy noted.   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 Warm Antibody hemolytic Anemia  PLAN: -Discussed pt labwork today, 09/20/19; all values are WNL except for WBC at 2.51, Hemoglobin at 8.2, HCT at 25.2, MCV at 100.4, RDW at 20.5, nRBC 1.0, Glucose Bld at 104, UN at 25, Calcium at 8.3, Total Protein at 6.1, Total Bilirubin at 3.5, LDH at 583  PENDING sample to blood bank. -Discussed 09/20/19 Hemoglobin at 8.2 -Discussed 09/20/19 nRBC is lower which can mean decreased red cell  breakage -Discussed 09/20/19 Bilirubin has decreased -Discussed 09/20/19 Creatine has increased, pt is no longer dehydrated. -Discussed 09/20/19 MCH is now 32.7  -Discussed  12/03/20Platelets have decreased to 295.  -Discussed the options to add a hormone shot aranesp, every two weeks, to help improve hemoglobin levels and decrease the amount of transfusions needed or to continue to monitor levels.  -Discussed that aranesp can increase the chance of blood clots.  -Discussed that I can provide handouts about the aranesp shots.  -Discussed to continue to taper prednisone with weekly labs. -Discussed the option for a port. The pt does not want a port.  -Discussed the option for a second opinion with Dr. Lysle Dingwall at [facility] . Pt is in the process of scheduling consultation.  -Discussed that pt my need a repeat bone marrow biopsy in the future or by the physician conducting her second opinion.     FOLLOW UP: Weekly labs and PRBC x 1 appointment x 4 weeks Starting Aranesp q2weeks from next week MD visit in 2 weeks  The total time spent in the appt was 30 minutes and more than 50% was on counseling and direct patient cares.  All of the patient's questions were answered with apparent satisfaction. The patient knows to call the clinic with any problems, questions or concerns.    Sullivan Lone MD [place] AAHIVMS [facility] Hematology/Oncology Physician [facility]  (Office):       [phone] (Work cell):  [phone] (Fax):           [phone]  I, Scot Dock, am acting as a scribe for Dr. Sullivan Lone.   .I have reviewed the above documentation for accuracy and completeness, and I agree with the above. Brunetta Genera MD

## 2019-09-24 ENCOUNTER — Telehealth: Payer: Self-pay | Admitting: Hematology

## 2019-09-24 ENCOUNTER — Telehealth: Payer: Self-pay | Admitting: *Deleted

## 2019-09-24 NOTE — Telephone Encounter (Signed)
Faxed medical records to Dr. Lysle Dingwall at [phone] Release [id number]

## 2019-09-24 NOTE — Telephone Encounter (Signed)
Patient called - she discussed referral for 2nd opinion to Dr. Lysle Dingwall at [facility] [phone]) with Dr. Irene Limbo at most recent appt. She requested Dr. Tana Felts office contacted and the 2nd opinion referral given to them.  Contacted New Patient Coordinator - Amber - at number given by patient . She requested the following documents (initial consult note; last 3 OV notes w/labs; all pathology and radiology reports related to diagnosis) be faxed to [phone].  Request for records/fax number sent to [facility].

## 2019-09-26 DIAGNOSIS — D461 Refractory anemia with ring sideroblasts: Secondary | ICD-10-CM | POA: Insufficient documentation

## 2019-09-28 ENCOUNTER — Other Ambulatory Visit: Payer: Self-pay

## 2019-09-28 ENCOUNTER — Inpatient Hospital Stay: Payer: Medicare Other

## 2019-09-28 ENCOUNTER — Telehealth: Payer: Self-pay | Admitting: Emergency Medicine

## 2019-09-28 DIAGNOSIS — D649 Anemia, unspecified: Secondary | ICD-10-CM

## 2019-09-28 DIAGNOSIS — D461 Refractory anemia with ring sideroblasts: Secondary | ICD-10-CM | POA: Diagnosis not present

## 2019-09-28 DIAGNOSIS — D5911 Warm autoimmune hemolytic anemia: Secondary | ICD-10-CM

## 2019-09-28 DIAGNOSIS — E860 Dehydration: Secondary | ICD-10-CM

## 2019-09-28 LAB — CBC WITH DIFFERENTIAL/PLATELET
Abs Immature Granulocytes: 0.22 10*3/uL — ABNORMAL HIGH (ref 0.00–0.07)
Basophils Absolute: 0 10*3/uL (ref 0.0–0.1)
Basophils Relative: 0 %
Eosinophils Absolute: 0 10*3/uL (ref 0.0–0.5)
Eosinophils Relative: 1 %
HCT: 20.6 % — ABNORMAL LOW (ref 36.0–46.0)
Hemoglobin: 7 g/dL — ABNORMAL LOW (ref 12.0–15.0)
Immature Granulocytes: 3 %
Lymphocytes Relative: 29 %
Lymphs Abs: 2.4 10*3/uL (ref 0.7–4.0)
MCH: 33.3 pg (ref 26.0–34.0)
MCHC: 34 g/dL (ref 30.0–36.0)
MCV: 98.1 fL (ref 80.0–100.0)
Monocytes Absolute: 0.4 10*3/uL (ref 0.1–1.0)
Monocytes Relative: 5 %
Neutro Abs: 5.1 10*3/uL (ref 1.7–7.7)
Neutrophils Relative %: 62 %
Platelets: 450 10*3/uL — ABNORMAL HIGH (ref 150–400)
RBC: 2.1 MIL/uL — ABNORMAL LOW (ref 3.87–5.11)
RDW: 20.9 % — ABNORMAL HIGH (ref 11.5–15.5)
WBC: 8.2 10*3/uL (ref 4.0–10.5)
nRBC: 5.6 % — ABNORMAL HIGH (ref 0.0–0.2)

## 2019-09-28 LAB — CMP (CANCER CENTER ONLY)
ALT: 25 U/L (ref 0–44)
AST: 25 U/L (ref 15–41)
Albumin: 4.2 g/dL (ref 3.5–5.0)
Alkaline Phosphatase: 39 U/L (ref 38–126)
Anion gap: 11 (ref 5–15)
BUN: 26 mg/dL — ABNORMAL HIGH (ref 8–23)
CO2: 27 mmol/L (ref 22–32)
Calcium: 8.9 mg/dL (ref 8.9–10.3)
Chloride: 105 mmol/L (ref 98–111)
Creatinine: 1.3 mg/dL — ABNORMAL HIGH (ref 0.44–1.00)
GFR, Est AFR Am: 47 mL/min — ABNORMAL LOW (ref >60)
GFR, Estimated: 40 mL/min — ABNORMAL LOW (ref >60)
Glucose, Bld: 160 mg/dL — ABNORMAL HIGH (ref 70–99)
Potassium: 3.3 mmol/L — ABNORMAL LOW (ref 3.5–5.1)
Sodium: 143 mmol/L (ref 135–145)
Total Bilirubin: 4.2 mg/dL (ref 0.3–1.2)
Total Protein: 6.2 g/dL — ABNORMAL LOW (ref 6.5–8.1)

## 2019-09-28 LAB — LACTATE DEHYDROGENASE: LDH: 733 U/L — ABNORMAL HIGH (ref 98–192)

## 2019-09-28 MED ORDER — SODIUM CHLORIDE 0.9% IV SOLUTION
250.0000 mL | Freq: Once | INTRAVENOUS | Status: DC
  Filled 2019-09-28: qty 250

## 2019-09-28 MED ORDER — DIPHENHYDRAMINE HCL 25 MG PO CAPS: 25.0000 mg | ORAL_CAPSULE | Freq: Once | ORAL | Status: DC

## 2019-09-28 MED ORDER — METHYLPREDNISOLONE SODIUM SUCC 125 MG IJ SOLR: 60.0000 mg | Freq: Once | INTRAMUSCULAR | Status: DC

## 2019-09-28 MED ORDER — ACETAMINOPHEN 325 MG PO TABS: 650.0000 mg | ORAL_TABLET | Freq: Once | ORAL | Status: DC

## 2019-09-28 NOTE — Progress Notes (Signed)
Critical lab result received. Bilirubin 4.2. Also made aware that patient's hemoglobin is 7.0. Dr. Irene Limbo made aware. Orders placed for one unit PRBC's. No orders received in relation to hemoglobin. Infusion RN made aware.

## 2019-09-28 NOTE — Progress Notes (Signed)
Per Eduardo Osier, RN in infusion blood bank will not have patient's blood ready until 4:30. Patient has decided to leave and return tomorrow to receive blood. Dr. Irene Limbo has ok'd patient receiving 2 units of blood on 09/29/19. Spoke to blood bank and representative stated she canceled orders and to re-enter 1 prepare order for today and transfuse 2 units for tomorrow.

## 2019-09-28 NOTE — Progress Notes (Signed)
Per blood bank, 2 units PRBCs being transferred here from a [place], [place] location. Arrival time unknown. Patient aware. Wendall Mola, RN spoke with Dr. Irene Limbo and will plan to reschedule transfusion for tomorrow morning and will receive 2 units d/t patient's significant DOE and fatigue. Patient aware of plan of care and agrees.

## 2019-09-28 NOTE — Telephone Encounter (Signed)
Critical lab value received: Total bilirubin 4.2 Desk RN Lanelle Bal for MD Irene Limbo made aware

## 2019-09-29 ENCOUNTER — Inpatient Hospital Stay: Payer: Medicare Other

## 2019-09-29 DIAGNOSIS — D461 Refractory anemia with ring sideroblasts: Secondary | ICD-10-CM | POA: Diagnosis not present

## 2019-09-29 DIAGNOSIS — D649 Anemia, unspecified: Secondary | ICD-10-CM

## 2019-09-29 LAB — HAPTOGLOBIN: Haptoglobin: <10 mg/dL — ABNORMAL LOW (ref 42–346)

## 2019-09-29 LAB — PREPARE RBC (CROSSMATCH)

## 2019-09-29 MED ORDER — ACETAMINOPHEN 325 MG PO TABS
650.0000 mg | ORAL_TABLET | Freq: Once | ORAL | Status: AC
  Administered 2019-09-29: 09:00:00 650 mg via ORAL

## 2019-09-29 MED ORDER — SODIUM CHLORIDE 0.9% IV SOLUTION
250.0000 mL | Freq: Once | INTRAVENOUS | Status: AC
  Administered 2019-09-29: 09:00:00 250 mL via INTRAVENOUS
  Filled 2019-09-29: qty 250

## 2019-09-29 MED ORDER — DIPHENHYDRAMINE HCL 25 MG PO CAPS
ORAL_CAPSULE | ORAL | Status: AC
  Filled 2019-09-29: qty 1

## 2019-09-29 MED ORDER — ACETAMINOPHEN 325 MG PO TABS
ORAL_TABLET | ORAL | Status: AC
  Filled 2019-09-29: qty 2

## 2019-09-29 MED ORDER — SODIUM CHLORIDE 0.9% IV SOLUTION
250.0000 mL | Freq: Once | INTRAVENOUS | Status: DC
  Filled 2019-09-29: qty 250

## 2019-09-29 MED ORDER — METHYLPREDNISOLONE SODIUM SUCC 125 MG IJ SOLR
INTRAMUSCULAR | Status: AC
  Filled 2019-09-29: qty 2

## 2019-09-29 MED ORDER — METHYLPREDNISOLONE SODIUM SUCC 125 MG IJ SOLR
60.0000 mg | Freq: Once | INTRAMUSCULAR | Status: AC
  Administered 2019-09-29: 60 mg via INTRAVENOUS

## 2019-09-29 MED ORDER — DIPHENHYDRAMINE HCL 25 MG PO CAPS
25.0000 mg | ORAL_CAPSULE | Freq: Once | ORAL | Status: AC
  Administered 2019-09-29: 09:00:00 25 mg via ORAL

## 2019-09-29 NOTE — Patient Instructions (Signed)
Blood Transfusion, Adult A blood transfusion is a procedure in which you are given blood through an IV tube. You may need this procedure because of:  Illness.  Surgery.  Injury. The blood may come from someone else (a donor). You may also be able to donate blood for yourself (autologous blood donation). The blood given in a transfusion is made up of different types of cells. You may get:  Red blood cells. These carry oxygen to the cells in the body.  White blood cells. These help you fight infections.  Platelets. These help your blood to clot.  Plasma. This is the liquid part of your blood. It helps with fluid imbalances. If you have a clotting disorder, you may also get other types of blood products. What happens before the procedure?  You will have a blood test to find out your blood type. The test also finds out what type of blood your body will accept and matches it to the donor type.  If you are going to have a planned surgery, you may be able to donate your own blood. This may be done in case you need a transfusion.  If you have had an allergic reaction to a transfusion in the past, you may be given medicine to help prevent a reaction. This medicine may be given to you by mouth or through an IV.  You will have your temperature, blood pressure, and pulse checked.  Follow instructions from your doctor about what you cannot eat or drink.  Ask your doctor about: ? Changing or stopping your regular medicines. This is important if you take diabetes medicines or blood thinners. ? Taking medicines such as aspirin and ibuprofen. These medicines can thin your blood. Do not take these medicines before your procedure if your doctor tells you not to. What happens during the procedure?  An IV tube will be put into one of your veins.  The bag of donated blood will be attached to your IV tube. Then, the blood will enter through your vein.  Your temperature, blood pressure, and pulse will  be checked regularly during the procedure. This is done to find early signs of a transfusion reaction.  If you have any signs or symptoms of a reaction, your transfusion will be stopped. You may also be given medicine.  When the transfusion is done, your IV tube will be taken out.  Pressure may be applied to the IV site for a few minutes.  A bandage (dressing) will be put on the IV site. The procedure may vary among doctors and hospitals. What happens after the procedure?  Your temperature, blood pressure, heart rate, breathing rate, and blood oxygen level will be checked often.  Your blood may be tested to see how you are responding to the transfusion.  You may be warmed with fluids or blankets. This is done to keep the temperature of your body normal. Summary  A blood transfusion is a procedure in which you are given blood through an IV tube.  The blood may come from someone else (a donor). You may also be able to donate blood for yourself.  If you have had an allergic reaction to a transfusion in the past, you may be given medicine to help prevent a reaction. This medicine may be given to you by mouth or through an IV tube.  Your temperature, blood pressure, heart rate, breathing rate, and blood oxygen level will be checked often.  Your blood may be tested to see   how you are responding to the transfusion. This information is not intended to replace advice given to you by your health care provider. Make sure you discuss any questions you have with your health care provider. Document Released: 12/31/2008 Document Revised: 11/24/2016 Document Reviewed: 05/28/2016 Elsevier Patient Education  2020 [employer]  

## 2019-10-01 LAB — BPAM RBC
Blood Product Expiration Date: 202012302359
Blood Product Expiration Date: 202101192359
ISSUE DATE / TIME: 202012120834
ISSUE DATE / TIME: 202012120834
Unit Type and Rh: 5100
Unit Type and Rh: 6200

## 2019-10-01 LAB — TYPE AND SCREEN
ABO/RH(D): A POS
Antibody Screen: POSITIVE
Donor AG Type: NEGATIVE
Donor AG Type: NEGATIVE
Unit division: 0
Unit division: 0

## 2019-10-03 ENCOUNTER — Other Ambulatory Visit: Payer: Self-pay

## 2019-10-03 ENCOUNTER — Telehealth: Payer: Self-pay | Admitting: *Deleted

## 2019-10-03 ENCOUNTER — Emergency Department (HOSPITAL_COMMUNITY): Payer: Medicare Other

## 2019-10-03 ENCOUNTER — Encounter (HOSPITAL_COMMUNITY): Payer: Self-pay | Admitting: Emergency Medicine

## 2019-10-03 ENCOUNTER — Emergency Department (HOSPITAL_COMMUNITY)
Admission: EM | Admit: 2019-10-03 | Discharge: 2019-10-03 | Disposition: A | Discharge status: Home / Self Care | Payer: Medicare Other | Attending: Emergency Medicine | Admitting: Emergency Medicine

## 2019-10-03 DIAGNOSIS — I10 Essential (primary) hypertension: Secondary | ICD-10-CM | POA: Diagnosis not present

## 2019-10-03 DIAGNOSIS — I480 Paroxysmal atrial fibrillation: Secondary | ICD-10-CM | POA: Insufficient documentation

## 2019-10-03 DIAGNOSIS — Z20828 Contact with and (suspected) exposure to other viral communicable diseases: Secondary | ICD-10-CM | POA: Insufficient documentation

## 2019-10-03 DIAGNOSIS — Z87891 Personal history of nicotine dependence: Secondary | ICD-10-CM | POA: Insufficient documentation

## 2019-10-03 DIAGNOSIS — I2510 Atherosclerotic heart disease of native coronary artery without angina pectoris: Secondary | ICD-10-CM | POA: Insufficient documentation

## 2019-10-03 DIAGNOSIS — R0602 Shortness of breath: Secondary | ICD-10-CM | POA: Diagnosis not present

## 2019-10-03 DIAGNOSIS — R05 Cough: Secondary | ICD-10-CM | POA: Insufficient documentation

## 2019-10-03 DIAGNOSIS — Z7901 Long term (current) use of anticoagulants: Secondary | ICD-10-CM | POA: Diagnosis not present

## 2019-10-03 DIAGNOSIS — Z79899 Other long term (current) drug therapy: Secondary | ICD-10-CM | POA: Insufficient documentation

## 2019-10-03 DIAGNOSIS — Z8673 Personal history of transient ischemic attack (TIA), and cerebral infarction without residual deficits: Secondary | ICD-10-CM | POA: Insufficient documentation

## 2019-10-03 DIAGNOSIS — R197 Diarrhea, unspecified: Secondary | ICD-10-CM | POA: Insufficient documentation

## 2019-10-03 DIAGNOSIS — R059 Cough, unspecified: Secondary | ICD-10-CM

## 2019-10-03 DIAGNOSIS — R5383 Other fatigue: Secondary | ICD-10-CM | POA: Insufficient documentation

## 2019-10-03 LAB — INFLUENZA PANEL BY PCR (TYPE A & B)
Influenza A By PCR: NEGATIVE
Influenza B By PCR: NEGATIVE

## 2019-10-03 MED ORDER — BENZONATATE 100 MG PO CAPS: 100.0000 mg | ORAL_CAPSULE | Freq: Three times a day (TID) | ORAL | 0 refills | Status: DC | PRN

## 2019-10-03 NOTE — Discharge Instructions (Signed)
Please take your medication, as prescribed.  It is important that you increase your food and fluid intake to avoid complication from your suspected upper respiratory infection.  Please adhere to isolation guidelines while COVID-19 testing is pending.  Please continue take Tylenol should you develop any fevers, chills, body aches.  Return to the ED or seek medical attention immediately should you develop any new or worsening symptoms.  Please follow-up with your PCP virtually regarding today's visit.

## 2019-10-03 NOTE — Telephone Encounter (Signed)
Contacted office - states cough and increased shortness of breath since Monday. She is short of breath ambulating inside home. States no fever and cough is non-productive. She states she has appt with Dr. Irene Limbo tomorrow and wanted to know what he advised. Dr. Irene Limbo informed.  Dr. Irene Limbo asked that patient be advised to go to ED today for evaluation of symptoms. Patient informed. States she will go.

## 2019-10-03 NOTE — ED Triage Notes (Signed)
Pt reports sometime Monday started feeling fatigued, cough that is mainly dry, loss of appetite, diarrhea. Reports her doctor sent her here for possible Covid.

## 2019-10-03 NOTE — ED Provider Notes (Signed)
[place] DEPT Provider Note   CSN: [id number] Arrival date & time: 10/03/19  1633     History Chief Complaint  Patient presents with  . Shortness of Breath  . Cough  . Fatigue  . Diarrhea  . loss of appetite    Alexandra Pierce is a 73 y.o. female with PMH sniffing for CVA, atrial fibrillation on Xarelto, HTN, HLD, and monthly blood transfusions for weakness attributable to anemia who presents to the ED with a 2-day history of nonproductive cough, fatigue, and diminished appetite.  Patient also endorses loose stools, but states that that has been going on for approximately 3 months.  She feels fatigued, particularly after standing up from seated position.  She has been trying her best to eat and drink, but admits that she has had diminished p.o. intake due to her loss of appetite.  She denies any headache or dizziness, fevers or chills, difficulty breathing, chest pain, abdominal pain, nausea or vomiting, or focal neurologic deficits.  She has not been taking anything for her recent onset symptoms.  She called her PCPs office who directed her to the ED given her significant PMH.  Patient lives at home alone, but feels as though she has good outpatient support and her daughter does not live far away.  HPI     Past Medical History:  Diagnosis Date  . Chronic lower back pain   . Hypercholesteremia   . Hypertension    mild  . Obesity   . Paroxysmal atrial fibrillation (HCC)    managed with anticoagulation and rate control.   . Stroke [facility] 2001 & 2015   "right side gets weak sometimes if I'm only tired" (03/16/2016)    Patient Active Problem List   Diagnosis Date Noted  . 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 Counseling regarding advance care planning and goals of care 08/16/2019  . Elevated serum lactate dehydrogenase (LDH)   . Renal lesion   . Syncope 05/22/2018  .  Symptomatic anemia 05/22/2018  . Unstable angina ([place])   . Chest pain 03/16/2016  . ARF (acute renal failure) ([place]) 03/16/2016  . Coronary artery disease involving native coronary artery of native heart with unstable angina pectoris ([place])   . Acute CVA (cerebrovascular accident) ([place]) 01/12/2014  . TIA (transient ischemic attack) 01/11/2014  . COLONIC POLYPS 12/15/2007  . Hyperlipidemia 12/15/2007  . INTERNAL HEMORRHOIDS WITHOUT MENTION COMP 12/15/2007  . SINUSITIS, CHRONIC 12/15/2007  . CONSTIPATION 12/15/2007  . RECTAL BLEEDING 12/15/2007  . OSTEOARTHRITIS 12/15/2007  . Essential hypertension 07/18/2007  . Atrial fibrillation ([place]) 07/18/2007  . CEREBROVASCULAR ACCIDENT, HX OF 07/18/2007    Past Surgical History:  Procedure Laterality Date  . CARDIAC CATHETERIZATION  03/31/2000   EF 49%/LAD lg vessel which coursed to the apex & gave rise to 2 diagonal branches/ LAD noted to have 30% ostial lesion & tapers to a sm vessel toward the apex but no high grade lesion/1st diagonal med sized vessel & 2nd diagonal sm vessel with no significant disease/ lt ventriculogram reveals mild global hypokinesis w/ an estimated EF of 45-50%  . CARDIAC CATHETERIZATION N/A 03/17/2016   Procedure: Left Heart Cath and Coronary Angiography;  Surgeon: Wellington Hampshire, MD;  Location: [place] CV LAB;  Service: Cardiovascular;  Laterality: N/A;  . LEFT HEART CATH AND CORONARY ANGIOGRAPHY N/A 03/31/2018   Procedure: LEFT HEART CATH AND CORONARY ANGIOGRAPHY;  Surgeon: Martinique, Peter M, MD;  Location: [place] CV LAB;  Service: Cardiovascular;  Laterality: N/A;     OB History   No obstetric history on file.     Family History  Problem Relation Age of Onset  . Heart disease Father   . Asthma Father   . Diabetes Father   . Heart disease Mother   . Heart disease Brother   . Hyperlipidemia Sister   . Hyperlipidemia Brother   . Hyperlipidemia Brother   . Cancer Neg Hx     Social History   Tobacco  Use  . Smoking status: Former Smoker    Packs/day: 0.12    Years: 15.00    Pack years: 1.80    Types: Cigarettes    Quit date: 10/18/1978    Years since quitting: 40.9  . Smokeless tobacco: Never Used  Substance Use Topics  . Alcohol use: Yes    Alcohol/week: 14.0 standard drinks    Types: 14 Glasses of wine per week  . Drug use: No    Home Medications Prior to Admission medications   Medication Sig Start Date End Date Taking? Authorizing Provider  acetaminophen (TYLENOL) 500 MG tablet Take 1,000 mg by mouth as needed for mild pain or headache.     [provider]  B Complex-C (B-COMPLEX WITH VITAMIN C) tablet Take 1 tablet by mouth daily.    [provider]  benzonatate (TESSALON) 100 MG capsule Take 1 capsule (100 mg total) by mouth every 8 (eight) hours as needed for cough. 10/03/19   Corena Herter, PA-C  cholecalciferol (VITAMIN D) 1000 UNITS tablet Take 1,000 Units by mouth daily.    [provider]  Cyanocobalamin (B-12) 1000 MCG SUBL Place 1,000 mcg under the tongue daily. 11/23/18   Brunetta Genera, MD  esomeprazole (NEXIUM) 40 MG capsule TAKE 1 CAPSULE(40 MG) BY MOUTH DAILY BEFORE BREAKFAST 08/31/19   Brunetta Genera, MD  fenofibrate 160 MG tablet Take 1 tablet (160 mg total) by mouth daily. 02/16/19   Burtis Junes, NP  folic acid (FOLVITE) 1 MG tablet Take 2 tablets (2 mg total) by mouth daily. 07/22/19   Brunetta Genera, MD  metoprolol tartrate (LOPRESSOR) 25 MG tablet Take 0.5 tablets (12.5 mg total) by mouth 2 (two) times daily. 04/04/19   Burtis Junes, NP  omeprazole (PRILOSEC) 20 MG capsule TAKE 1 CAPSULE(20 MG) BY MOUTH DAILY 08/16/19   Brunetta Genera, MD  ondansetron (ZOFRAN) 4 MG tablet TAKE 1 TABLET(4 MG) BY MOUTH EVERY 8 HOURS AS NEEDED FOR NAUSEA OR VOMITING 09/03/19   Brunetta Genera, MD  XARELTO 20 MG TABS tablet TAKE 1 TABLET BY MOUTH ONCE DAILY 04/18/19   Martinique, Peter M, MD    Allergies    Sulfonamide  derivatives, Lipitor [atorvastatin], Pravachol [pravastatin sodium], and Statins  Review of Systems   Review of Systems  All other systems reviewed and are negative.   Physical Exam Updated Vital Signs BP 112/76 (BP Location: Left Arm)   Pulse (!) 113   Temp 98.4 F (36.9 C) (Oral)   Resp 18   SpO2 100%   Physical Exam Vitals and nursing note reviewed. Exam conducted with a chaperone present.  Constitutional:      Appearance: Normal appearance.     Comments: No acute distress.  HENT:     Head: Normocephalic and atraumatic.  Eyes:     General: No scleral icterus.    Extraocular Movements: Extraocular movements intact.     Conjunctiva/sclera: Conjunctivae  normal.     Pupils: Pupils are equal, round, and reactive to light.  Cardiovascular:     Rate and Rhythm: Normal rate. Rhythm irregular.     Pulses: Normal pulses.     Heart sounds: Normal heart sounds.  Pulmonary:     Effort: Pulmonary effort is normal. No respiratory distress.     Breath sounds: No wheezing or rales.  Abdominal:     General: Abdomen is flat. There is no distension.     Palpations: Abdomen is soft.     Tenderness: There is no abdominal tenderness. There is no guarding.  Skin:    General: Skin is dry.  Neurological:     General: No focal deficit present.     Mental Status: She is alert and oriented to person, place, and time.     GCS: GCS eye subscore is 4. GCS verbal subscore is 5. GCS motor subscore is 6.     Cranial Nerves: No cranial nerve deficit.     Sensory: No sensory deficit.     Motor: No weakness.  Psychiatric:        Mood and Affect: Mood normal.        Behavior: Behavior normal.        Thought Content: Thought content normal.       ED Results / Procedures / Treatments   Labs (all labs ordered are listed, but only abnormal results are displayed) Labs Reviewed  SARS CORONAVIRUS 2 (TAT 6-24 HRS)  INFLUENZA PANEL BY PCR (TYPE A & B)    EKG EKG  Interpretation  Date/Time:  Wednesday October 03 2019 16:52:01 EST Ventricular Rate:  108 PR Interval:    QRS Duration: 86 QT Interval:  331 QTC Calculation: 444 R Axis:   52 Text Interpretation: Atrial fibrillation Baseline wander in lead(s) I II aVR aVL aVF No significant change since last tracing Confirmed by Virgel Manifold [phone]) on 10/03/2019 6:08:21 PM   Radiology DG Chest Portable 1 View  Result Date: 10/03/2019 CLINICAL DATA:  Cough, shortness of breath EXAM: PORTABLE CHEST 1 VIEW COMPARISON:  04/11/2018 FINDINGS: Heart and mediastinal contours are within normal limits. No focal opacities or effusions. No acute bony abnormality. IMPRESSION: No active disease. Electronically Signed   By: Rolm Baptise M.D.   On: 10/03/2019 17:44    Procedures Procedures (including critical care time)  Medications Ordered in ED Medications - No data to display  ED Course  I have reviewed the triage vital signs and the nursing notes.  Pertinent labs & imaging results that were available during my care of the patient were reviewed by me and considered in my medical decision making (see chart for details).    MDM Rules/Calculators/A&P                      DG chest demonstrates no acute cardiopulmonary disease. While patient endorses a history of CVAs, she reports that this feels entirely different.  She denies any focal neurologic deficits.  She also feels as though this is different from her prior episodes of anemia where she has a headache that accompanies her profound weakness.  She is mainly concerned for COVID-19 and influenza testing and the only reason why she is here in the ED is because her PCP deemed her high risk.  No leg swelling on my exam and patient is already on Xarelto, which she takes religiously.  Low suspicion for pulmonary embolism despite pleuritic cough.  She denies any active chest  pain and EKG demonstrated atrial fibrillation, but no evidence of an ischemia.  Will  obtain COVID-19 and influenza testing.  Given that she recently finished prednisone taper and is otherwise immunocompromise, will suggest that she consider Tamiflu should she test positive for influenza.  Otherwise, will prescribe her Ladona Ridgel for her cough symptoms and emphasized the importance of increased p.o. intake.  We discussed Ensure and other methods for increasing caloric intake.  On reassessment, patient is feeling improved after receiving 1 L fluids.  Discussed strict return cautions with the patient and will want her to follow-up with her PCP virtually regarding today's visit. All of the evaluation and work-up results were discussed with the patient and any family at bedside. They were provided opportunity to ask any additional questions and have none at this time. They have expressed understanding of verbal discharge instructions as well as return precautions and are agreeable to the plan.   Alexandra Pierce was evaluated in Emergency Department on 10/03/2019 for the symptoms described in the history of present illness. She was evaluated in the context of the global COVID-19 pandemic, which necessitated consideration that the patient might be at risk for infection with the SARS-CoV-2 virus that causes COVID-19. Institutional protocols and algorithms that pertain to the evaluation of patients at risk for COVID-19 are in a state of rapid change based on information released by regulatory bodies including the CDC and federal and state organizations. These policies and algorithms were followed during the patient's care in the ED.   Final Clinical Impression(s) / ED Diagnoses Final diagnoses:  Cough    Rx / DC Orders ED Discharge Orders         Ordered    benzonatate (TESSALON) 100 MG capsule  Every 8 hours PRN     10/03/19 1859           Corena Herter, PA-C 10/03/19 2024    Virgel Manifold, MD 10/04/19 1642

## 2019-10-03 NOTE — ED Notes (Signed)
X-ray at bedside

## 2019-10-04 ENCOUNTER — Inpatient Hospital Stay: Payer: Medicare Other | Admitting: Hematology

## 2019-10-04 ENCOUNTER — Telehealth: Payer: Self-pay | Admitting: *Deleted

## 2019-10-04 ENCOUNTER — Inpatient Hospital Stay: Payer: Medicare Other

## 2019-10-04 ENCOUNTER — Other Ambulatory Visit: Payer: Self-pay

## 2019-10-04 ENCOUNTER — Other Ambulatory Visit: Payer: Self-pay | Admitting: *Deleted

## 2019-10-04 DIAGNOSIS — D461 Refractory anemia with ring sideroblasts: Secondary | ICD-10-CM

## 2019-10-04 DIAGNOSIS — D5911 Warm autoimmune hemolytic anemia: Secondary | ICD-10-CM

## 2019-10-04 DIAGNOSIS — D649 Anemia, unspecified: Secondary | ICD-10-CM

## 2019-10-04 LAB — CBC WITH DIFFERENTIAL (CANCER CENTER ONLY)
Abs Immature Granulocytes: 0.09 10*3/uL — ABNORMAL HIGH (ref 0.00–0.07)
Basophils Absolute: 0 10*3/uL (ref 0.0–0.1)
Basophils Relative: 1 %
Eosinophils Absolute: 0 10*3/uL (ref 0.0–0.5)
Eosinophils Relative: 1 %
HCT: 19.9 % — ABNORMAL LOW (ref 36.0–46.0)
Hemoglobin: 6.7 g/dL — CL (ref 12.0–15.0)
Immature Granulocytes: 2 %
Lymphocytes Relative: 23 %
Lymphs Abs: 1.3 10*3/uL (ref 0.7–4.0)
MCH: 32.7 pg (ref 26.0–34.0)
MCHC: 33.7 g/dL (ref 30.0–36.0)
MCV: 97.1 fL (ref 80.0–100.0)
Monocytes Absolute: 0.3 10*3/uL (ref 0.1–1.0)
Monocytes Relative: 5 %
Neutro Abs: 3.7 10*3/uL (ref 1.7–7.7)
Neutrophils Relative %: 68 %
Platelet Count: 346 10*3/uL (ref 150–400)
RBC: 2.05 MIL/uL — ABNORMAL LOW (ref 3.87–5.11)
RDW: 20.4 % — ABNORMAL HIGH (ref 11.5–15.5)
WBC Count: 5.4 10*3/uL (ref 4.0–10.5)
nRBC: 1.3 % — ABNORMAL HIGH (ref 0.0–0.2)

## 2019-10-04 LAB — CMP (CANCER CENTER ONLY)
ALT: 32 U/L (ref 0–44)
AST: 43 U/L — ABNORMAL HIGH (ref 15–41)
Albumin: 3.9 g/dL (ref 3.5–5.0)
Alkaline Phosphatase: 47 U/L (ref 38–126)
Anion gap: 13 (ref 5–15)
BUN: 23 mg/dL (ref 8–23)
CO2: 21 mmol/L — ABNORMAL LOW (ref 22–32)
Calcium: 8.5 mg/dL — ABNORMAL LOW (ref 8.9–10.3)
Chloride: 109 mmol/L (ref 98–111)
Creatinine: 1.02 mg/dL — ABNORMAL HIGH (ref 0.44–1.00)
GFR, Est AFR Am: >60 mL/min (ref >60)
GFR, Estimated: 54 mL/min — ABNORMAL LOW (ref >60)
Glucose, Bld: 112 mg/dL — ABNORMAL HIGH (ref 70–99)
Potassium: 4 mmol/L (ref 3.5–5.1)
Sodium: 143 mmol/L (ref 135–145)
Total Bilirubin: 5.3 mg/dL (ref 0.3–1.2)
Total Protein: 6.5 g/dL (ref 6.5–8.1)

## 2019-10-04 LAB — SAMPLE TO BLOOD BANK

## 2019-10-04 LAB — PREPARE RBC (CROSSMATCH)

## 2019-10-04 LAB — SARS CORONAVIRUS 2 (TAT 6-24 HRS): SARS Coronavirus 2: NEGATIVE

## 2019-10-04 LAB — LACTATE DEHYDROGENASE: LDH: 975 U/L — ABNORMAL HIGH (ref 98–192)

## 2019-10-04 NOTE — Telephone Encounter (Signed)
Contacted patient - Covid/flu tests completed last night in ED were negative. Per Dr. Irene Limbo, schedule for labs prior to transfusion appt on Friday 12/18 since patient's lab appt for today was cancelled pending test results from ED.  Patient in agreement - schedule message sent

## 2019-10-04 NOTE — Progress Notes (Signed)
Spoke to [place] in blood bank and confirmed orders were received to prepare 2 units for patient to receive blood tomorrow 10/05/19.

## 2019-10-05 ENCOUNTER — Inpatient Hospital Stay: Payer: Medicare Other

## 2019-10-05 ENCOUNTER — Other Ambulatory Visit: Payer: Self-pay

## 2019-10-05 ENCOUNTER — Other Ambulatory Visit: Payer: Medicare Other

## 2019-10-05 DIAGNOSIS — D461 Refractory anemia with ring sideroblasts: Secondary | ICD-10-CM | POA: Diagnosis not present

## 2019-10-05 DIAGNOSIS — D649 Anemia, unspecified: Secondary | ICD-10-CM

## 2019-10-05 MED ORDER — ACETAMINOPHEN 325 MG PO TABS
ORAL_TABLET | ORAL | Status: AC
  Filled 2019-10-05: qty 2

## 2019-10-05 MED ORDER — METHYLPREDNISOLONE SODIUM SUCC 125 MG IJ SOLR
INTRAMUSCULAR | Status: AC
  Filled 2019-10-05: qty 2

## 2019-10-05 MED ORDER — ACETAMINOPHEN 325 MG PO TABS
650.0000 mg | ORAL_TABLET | Freq: Once | ORAL | Status: AC
  Administered 2019-10-05: 650 mg via ORAL

## 2019-10-05 MED ORDER — DIPHENHYDRAMINE HCL 25 MG PO CAPS
ORAL_CAPSULE | ORAL | Status: AC
  Filled 2019-10-05: qty 1

## 2019-10-05 MED ORDER — SODIUM CHLORIDE 0.9% IV SOLUTION
250.0000 mL | Freq: Once | INTRAVENOUS | Status: DC
  Filled 2019-10-05: qty 250

## 2019-10-05 MED ORDER — DIPHENHYDRAMINE HCL 25 MG PO CAPS
25.0000 mg | ORAL_CAPSULE | Freq: Once | ORAL | Status: AC
  Administered 2019-10-05: 09:00:00 25 mg via ORAL

## 2019-10-05 MED ORDER — METHYLPREDNISOLONE SODIUM SUCC 125 MG IJ SOLR
60.0000 mg | Freq: Once | INTRAMUSCULAR | Status: AC
  Administered 2019-10-05: 60 mg via INTRAVENOUS

## 2019-10-05 NOTE — Progress Notes (Signed)
OK to proceed with blood transfusion per MD Irene Limbo with regards to HR today

## 2019-10-07 LAB — TYPE AND SCREEN
ABO/RH(D): A POS
Antibody Screen: POSITIVE
Donor AG Type: NEGATIVE
Donor AG Type: NEGATIVE
Unit division: 0
Unit division: 0

## 2019-10-07 LAB — BPAM RBC
Blood Product Expiration Date: 202101242359
Blood Product Expiration Date: 202101252359
ISSUE DATE / TIME: 202012181031
ISSUE DATE / TIME: 202012181255
Unit Type and Rh: 5100
Unit Type and Rh: 6200

## 2019-10-09 ENCOUNTER — Telehealth: Payer: Self-pay | Admitting: *Deleted

## 2019-10-09 ENCOUNTER — Telehealth: Payer: Self-pay | Admitting: Nurse Practitioner

## 2019-10-09 NOTE — Telephone Encounter (Signed)
Called and spoke with Judeen Hammans, APP at [facility].   She had a virtual visit with Alexandra Pierce today - does not typically see her routinely - [place] continues with significant dyspnea and now with dry cough. She has been tested for flu and COVID - both negative. She has had no relief with tessalon perles. Oncology wanted her to follow with PCP. She was asking for my recommendations.   I advised - to repeat lab - wonder if she is still anemic - she has required multiple transfusions.  Repeat COVID testing Add BNP to her labs to make sure we are not dealing with volume overload.   I am happy to help as needed. She was appreciative of my call back.   Burtis Junes, RN, [facility] [address] [place], [place]  [id number] [phone]

## 2019-10-09 NOTE — Telephone Encounter (Signed)
Patient's daughter called  - she is concerned about mother. Using tessalon pearls as prescribed by ED MD, but continues to cough and is wheezing.Covid and flu tests were negative. Gave her information that mother needs to follow up with her PCP for evaluation or if symptoms worsen or short of breath, she should go to ED. Daughter states she will encourage her mother to contact her PCP regarding her cough and upper respiratory symptoms.

## 2019-10-09 NOTE — Telephone Encounter (Signed)
Judeen Hammans, APP from [facility] wold like Alexandra Pierce to call her by 4:30 today to discuss a mutual patient. Judeen Hammans can be reached at [phone]

## 2019-10-10 ENCOUNTER — Other Ambulatory Visit: Payer: Self-pay

## 2019-10-10 ENCOUNTER — Encounter (HOSPITAL_COMMUNITY): Payer: Self-pay | Admitting: Emergency Medicine

## 2019-10-10 ENCOUNTER — Inpatient Hospital Stay (HOSPITAL_COMMUNITY)
Admission: EM | Admit: 2019-10-10 | Discharge: 2019-10-13 | DRG: 809 | Disposition: A | Discharge status: Home / Self Care | Payer: Medicare Other | Attending: Internal Medicine | Admitting: Internal Medicine

## 2019-10-10 ENCOUNTER — Emergency Department (HOSPITAL_COMMUNITY): Payer: Medicare Other

## 2019-10-10 DIAGNOSIS — D464 Refractory anemia, unspecified: Secondary | ICD-10-CM | POA: Diagnosis present

## 2019-10-10 DIAGNOSIS — Z8679 Personal history of other diseases of the circulatory system: Secondary | ICD-10-CM

## 2019-10-10 DIAGNOSIS — Z8349 Family history of other endocrine, nutritional and metabolic diseases: Secondary | ICD-10-CM

## 2019-10-10 DIAGNOSIS — I69351 Hemiplegia and hemiparesis following cerebral infarction affecting right dominant side: Secondary | ICD-10-CM

## 2019-10-10 DIAGNOSIS — R739 Hyperglycemia, unspecified: Secondary | ICD-10-CM | POA: Diagnosis not present

## 2019-10-10 DIAGNOSIS — E7800 Pure hypercholesterolemia, unspecified: Secondary | ICD-10-CM | POA: Diagnosis present

## 2019-10-10 DIAGNOSIS — Z882 Allergy status to sulfonamides status: Secondary | ICD-10-CM

## 2019-10-10 DIAGNOSIS — D469 Myelodysplastic syndrome, unspecified: Secondary | ICD-10-CM | POA: Diagnosis not present

## 2019-10-10 DIAGNOSIS — Z20828 Contact with and (suspected) exposure to other viral communicable diseases: Secondary | ICD-10-CM | POA: Diagnosis present

## 2019-10-10 DIAGNOSIS — D461 Refractory anemia with ring sideroblasts: Secondary | ICD-10-CM

## 2019-10-10 DIAGNOSIS — R0602 Shortness of breath: Secondary | ICD-10-CM | POA: Diagnosis not present

## 2019-10-10 DIAGNOSIS — D649 Anemia, unspecified: Secondary | ICD-10-CM | POA: Diagnosis not present

## 2019-10-10 DIAGNOSIS — I4891 Unspecified atrial fibrillation: Secondary | ICD-10-CM | POA: Diagnosis present

## 2019-10-10 DIAGNOSIS — D5911 Warm autoimmune hemolytic anemia: Principal | ICD-10-CM | POA: Diagnosis present

## 2019-10-10 DIAGNOSIS — Z833 Family history of diabetes mellitus: Secondary | ICD-10-CM

## 2019-10-10 DIAGNOSIS — Z87891 Personal history of nicotine dependence: Secondary | ICD-10-CM

## 2019-10-10 DIAGNOSIS — G8929 Other chronic pain: Secondary | ICD-10-CM | POA: Diagnosis present

## 2019-10-10 DIAGNOSIS — Z8249 Family history of ischemic heart disease and other diseases of the circulatory system: Secondary | ICD-10-CM

## 2019-10-10 DIAGNOSIS — I10 Essential (primary) hypertension: Secondary | ICD-10-CM | POA: Diagnosis present

## 2019-10-10 DIAGNOSIS — Z7901 Long term (current) use of anticoagulants: Secondary | ICD-10-CM

## 2019-10-10 DIAGNOSIS — D589 Hereditary hemolytic anemia, unspecified: Secondary | ICD-10-CM | POA: Diagnosis present

## 2019-10-10 DIAGNOSIS — Z79899 Other long term (current) drug therapy: Secondary | ICD-10-CM

## 2019-10-10 DIAGNOSIS — M545 Low back pain: Secondary | ICD-10-CM | POA: Diagnosis present

## 2019-10-10 DIAGNOSIS — Z66 Do not resuscitate: Secondary | ICD-10-CM | POA: Diagnosis present

## 2019-10-10 DIAGNOSIS — I4819 Other persistent atrial fibrillation: Secondary | ICD-10-CM | POA: Diagnosis present

## 2019-10-10 DIAGNOSIS — T380X5A Adverse effect of glucocorticoids and synthetic analogues, initial encounter: Secondary | ICD-10-CM | POA: Diagnosis not present

## 2019-10-10 DIAGNOSIS — Z825 Family history of asthma and other chronic lower respiratory diseases: Secondary | ICD-10-CM

## 2019-10-10 DIAGNOSIS — N280 Ischemia and infarction of kidney: Secondary | ICD-10-CM | POA: Diagnosis present

## 2019-10-10 DIAGNOSIS — Z888 Allergy status to other drugs, medicaments and biological substances status: Secondary | ICD-10-CM

## 2019-10-10 LAB — RETICULOCYTES
Immature Retic Fract: 23 % — ABNORMAL HIGH (ref 2.3–15.9)
RBC.: 2.14 MIL/uL — ABNORMAL LOW (ref 3.87–5.11)
Retic Count, Absolute: 127.5 10*3/uL (ref 19.0–186.0)
Retic Ct Pct: 6 % — ABNORMAL HIGH (ref 0.4–3.1)

## 2019-10-10 LAB — CBC WITH DIFFERENTIAL/PLATELET
Abs Immature Granulocytes: 0.08 10*3/uL — ABNORMAL HIGH (ref 0.00–0.07)
Basophils Absolute: 0 10*3/uL (ref 0.0–0.1)
Basophils Relative: 1 %
Eosinophils Absolute: 0 10*3/uL (ref 0.0–0.5)
Eosinophils Relative: 1 %
HCT: 19.7 % — ABNORMAL LOW (ref 36.0–46.0)
Hemoglobin: 6.5 g/dL — CL (ref 12.0–15.0)
Immature Granulocytes: 2 %
Lymphocytes Relative: 19 %
Lymphs Abs: 1 10*3/uL (ref 0.7–4.0)
MCH: 31.9 pg (ref 26.0–34.0)
MCHC: 33 g/dL (ref 30.0–36.0)
MCV: 96.6 fL (ref 80.0–100.0)
Monocytes Absolute: 0.2 10*3/uL (ref 0.1–1.0)
Monocytes Relative: 3 %
Neutro Abs: 3.8 10*3/uL (ref 1.7–7.7)
Neutrophils Relative %: 74 %
Platelets: 397 10*3/uL (ref 150–400)
RBC: 2.04 MIL/uL — ABNORMAL LOW (ref 3.87–5.11)
RDW: 19.1 % — ABNORMAL HIGH (ref 11.5–15.5)
WBC: 5.1 10*3/uL (ref 4.0–10.5)
nRBC: 2.3 % — ABNORMAL HIGH (ref 0.0–0.2)

## 2019-10-10 LAB — TROPONIN I (HIGH SENSITIVITY)
Troponin I (High Sensitivity): 7 ng/L (ref <18)
Troponin I (High Sensitivity): 6 ng/L (ref <18)

## 2019-10-10 LAB — COMPREHENSIVE METABOLIC PANEL
ALT: 24 U/L (ref 0–44)
ALT: 26 U/L (ref 0–44)
AST: 32 U/L (ref 15–41)
AST: 29 U/L (ref 15–41)
Albumin: 3.9 g/dL (ref 3.5–5.0)
Albumin: 3.9 g/dL (ref 3.5–5.0)
Alkaline Phosphatase: 40 U/L (ref 38–126)
Alkaline Phosphatase: 43 U/L (ref 38–126)
Anion gap: 11 (ref 5–15)
Anion gap: 13 (ref 5–15)
BUN: 25 mg/dL — ABNORMAL HIGH (ref 8–23)
BUN: 27 mg/dL — ABNORMAL HIGH (ref 8–23)
CO2: 22 mmol/L (ref 22–32)
CO2: 21 mmol/L — ABNORMAL LOW (ref 22–32)
Calcium: 8.8 mg/dL — ABNORMAL LOW (ref 8.9–10.3)
Calcium: 9.1 mg/dL (ref 8.9–10.3)
Chloride: 107 mmol/L (ref 98–111)
Chloride: 104 mmol/L (ref 98–111)
Creatinine, Ser: 1.06 mg/dL — ABNORMAL HIGH (ref 0.44–1.00)
Creatinine, Ser: 1.09 mg/dL — ABNORMAL HIGH (ref 0.44–1.00)
GFR calc Af Amer: 60 mL/min — ABNORMAL LOW (ref >60)
GFR calc Af Amer: 58 mL/min — ABNORMAL LOW (ref >60)
GFR calc non Af Amer: 52 mL/min — ABNORMAL LOW (ref >60)
GFR calc non Af Amer: 50 mL/min — ABNORMAL LOW (ref >60)
Glucose, Bld: 128 mg/dL — ABNORMAL HIGH (ref 70–99)
Glucose, Bld: 214 mg/dL — ABNORMAL HIGH (ref 70–99)
Potassium: 3.6 mmol/L (ref 3.5–5.1)
Potassium: 3.9 mmol/L (ref 3.5–5.1)
Sodium: 140 mmol/L (ref 135–145)
Sodium: 138 mmol/L (ref 135–145)
Total Bilirubin: 5.9 mg/dL — ABNORMAL HIGH (ref 0.3–1.2)
Total Bilirubin: 4.7 mg/dL — ABNORMAL HIGH (ref 0.3–1.2)
Total Protein: 6.5 g/dL (ref 6.5–8.1)
Total Protein: 6.6 g/dL (ref 6.5–8.1)

## 2019-10-10 LAB — VITAMIN B12: Vitamin B-12: 1446 pg/mL — ABNORMAL HIGH (ref 180–914)

## 2019-10-10 LAB — PREPARE RBC (CROSSMATCH)

## 2019-10-10 LAB — LACTATE DEHYDROGENASE: LDH: 774 U/L — ABNORMAL HIGH (ref 98–192)

## 2019-10-10 LAB — SARS CORONAVIRUS 2 (TAT 6-24 HRS): SARS Coronavirus 2: NEGATIVE

## 2019-10-10 LAB — PROTIME-INR
INR: 2.5 — ABNORMAL HIGH (ref 0.8–1.2)
Prothrombin Time: 26.9 seconds — ABNORMAL HIGH (ref 11.4–15.2)

## 2019-10-10 LAB — BRAIN NATRIURETIC PEPTIDE: B Natriuretic Peptide: 130.7 pg/mL — ABNORMAL HIGH (ref 0.0–100.0)

## 2019-10-10 IMAGING — CT CT CHEST W/ CM
2 of 5 series · 12 of 46 positions shown, 14 images · IV contrast (omnipaque)
Comparison: Chest radiograph 04/11/2018

ADDENDUM:
These results were called by telephone at the time of interpretation
on 05/23/2018 at [DATE] to Dr. ZDISLAV HEROLD , who verbally
acknowledged these results.
CLINICAL DATA: Anemia, weakness

EXAM:
CT CHEST, ABDOMEN, AND PELVIS WITH CONTRAST
TECHNIQUE: Multidetector CT imaging of the chest, abdomen and pelvis was
performed following the standard protocol during bolus
administration of intravenous contrast.
CONTRAST:  75mL OMNIPAQUE IOHEXOL 300 MG/ML  SOLN

[Series 3: cap with · axial · 0.79mm/px · z∈[+723,+1273]mm · 9 of 130 slices shown, 11 images]
[im 10/130  soft-tissue]
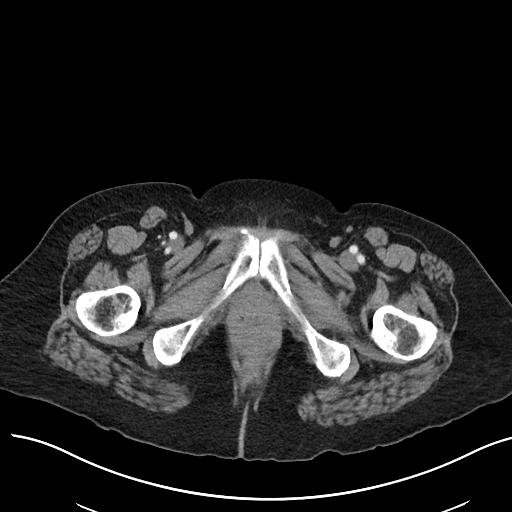
[im 10/130  bone]
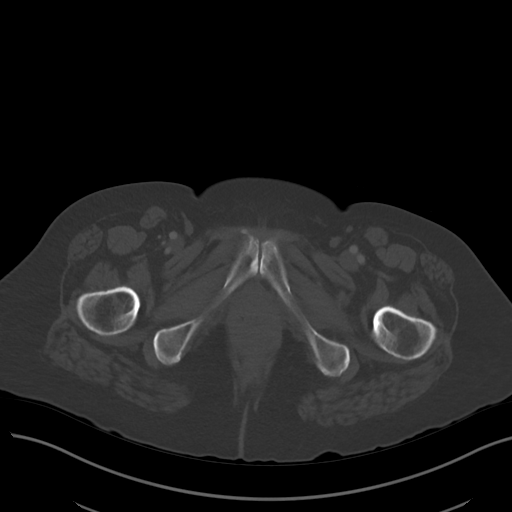
[im 30/130  soft-tissue]
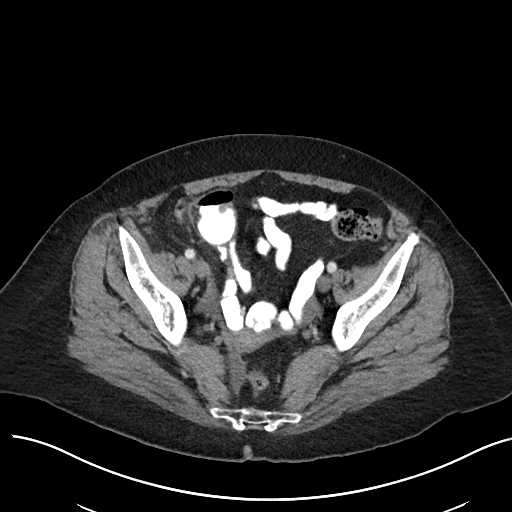
[im 40/130  soft-tissue]
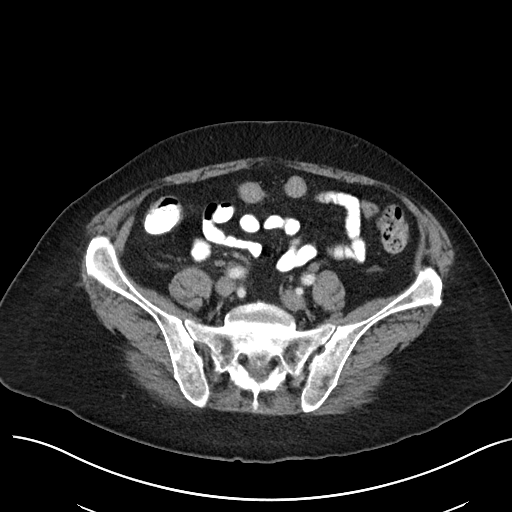
[im 50/130  soft-tissue]
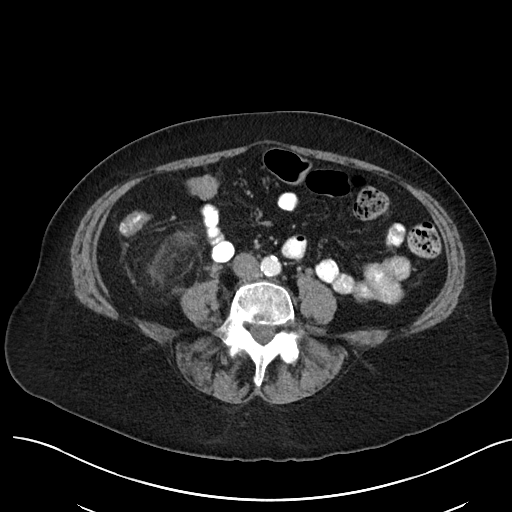
[im 70/130  soft-tissue]
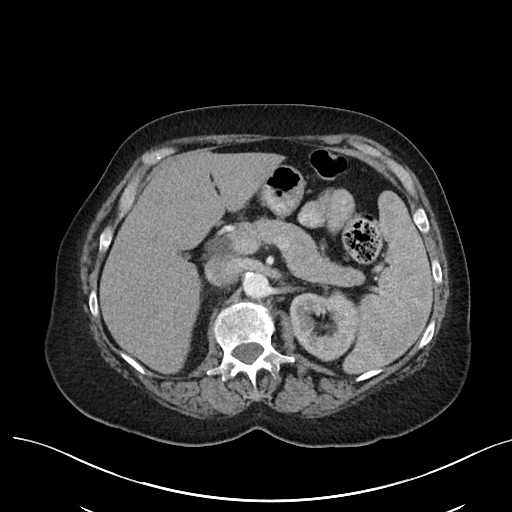
[im 80/130  soft-tissue]
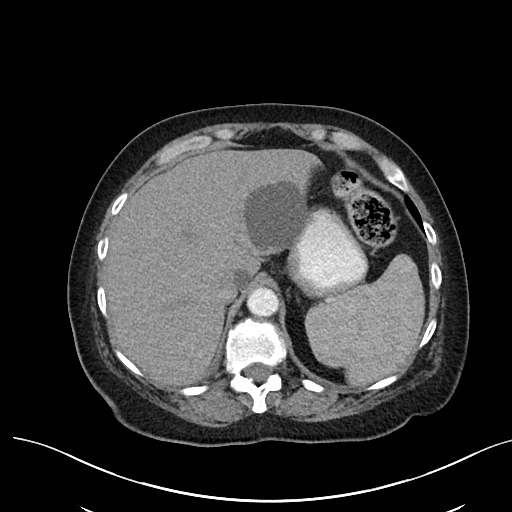
[im 90/130  soft-tissue]
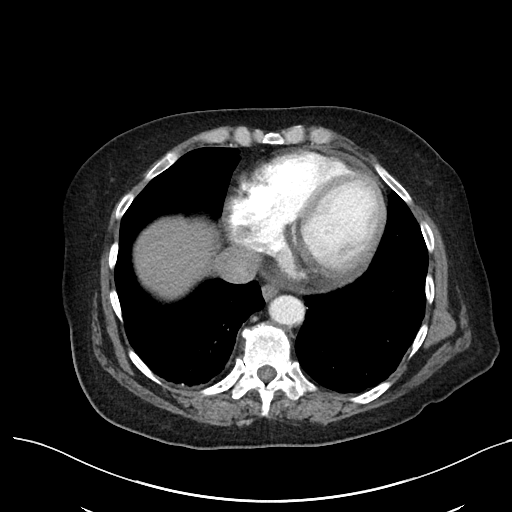
[im 110/130  soft-tissue]
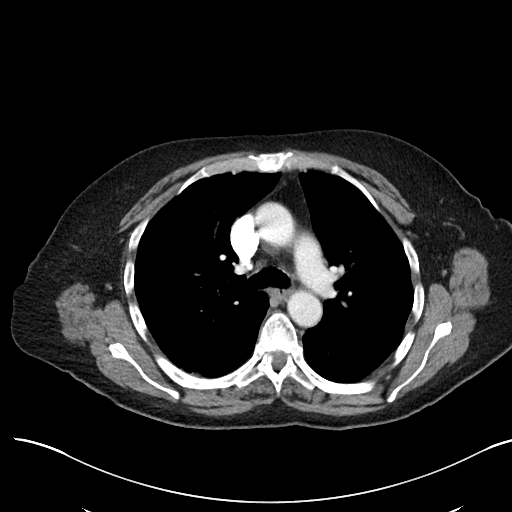
[im 120/130  soft-tissue]
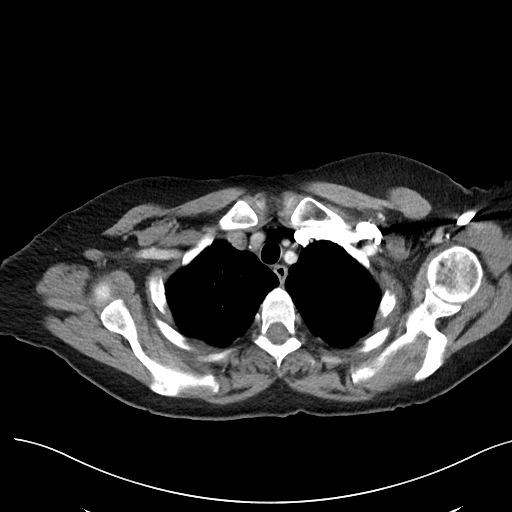
[im 120/130  bone]
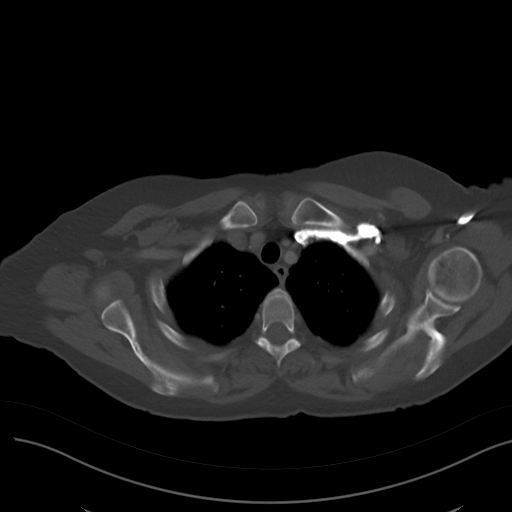

[Series 6: cor · coronal · 0.74mm/px · 3 of 101 slices shown]
[im 34/101  soft-tissue]
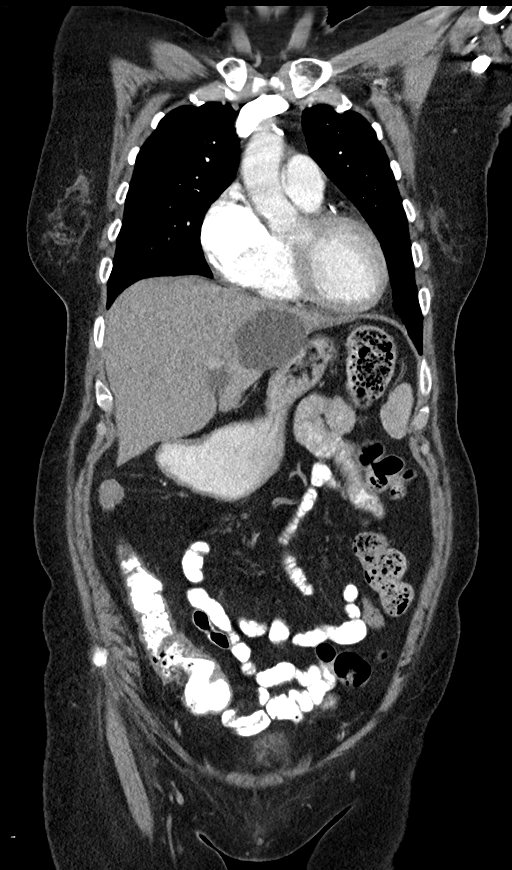
[im 45/101  soft-tissue]
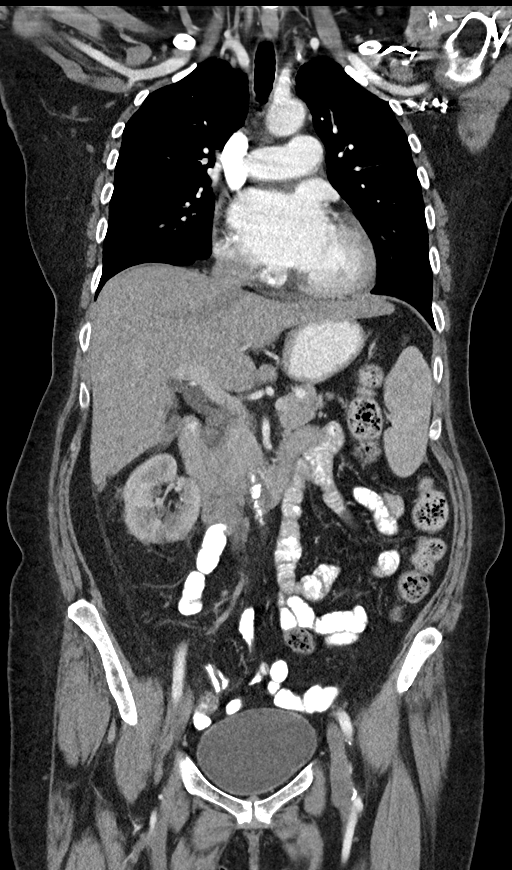
[im 56/101  soft-tissue]
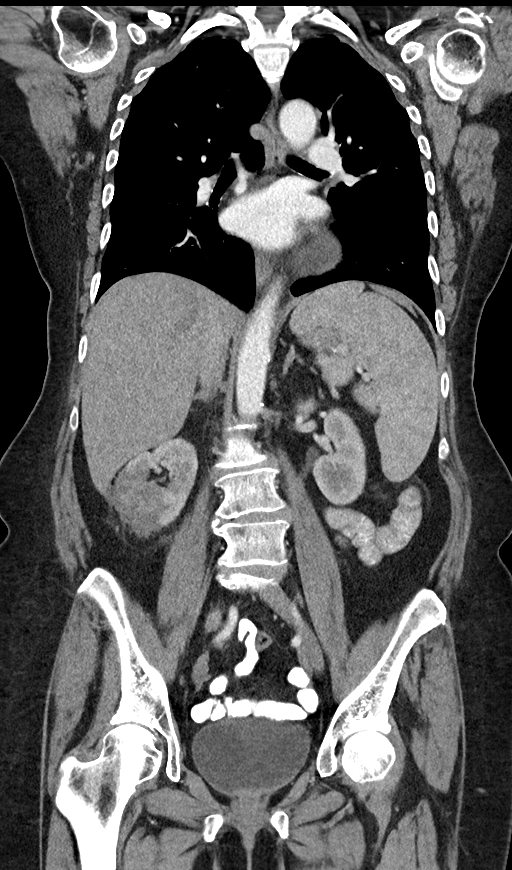

[12 of 46 positions shown; findings below may reference images not displayed]

FINDINGS: CT CHEST FINDINGS

Cardiovascular: Coronary, aortic arch, and branch vessel
atherosclerotic vascular disease.

Mediastinum/Nodes: Unremarkable

Lungs/Pleura: Biapical pleuroparenchymal scarring. Old granulomatous
disease with calcified granulomas most notably in a right
peribronchovascular distribution. Paraseptal emphysema.

Musculoskeletal: Mild thoracic spondylosis. Small sebaceous cyst or
similar likely benign lesion along the right paracentral back at the
T12 vertebral level.

CT ABDOMEN PELVIS FINDINGS

Hepatobiliary: Various hypodense hepatic lesions are present, the
smaller lesions are technically too small to characterize but the
larger lesions are fluid density and compatible with cysts. The
largest is in the lateral segment left hepatic lobe measuring 6.1 by
5.1 cm with internal density of 6 Hounsfield units.

The gallbladder is contracted. Cholelithiasis is present with
nitrogen gas phenomenon within at least 1 gallstone. The common
hepatic duct measures up to 1.2 cm in diameter, with the common bile
duct at 0.9 cm.

Pancreas: Unremarkable

Spleen: The spleen measures 14.3 by 5.6 by 14.2 cm (volume = 600
cm^3) and is slightly heterogeneous on the late arterial phase
images but homogeneous on the delayed images.

Adrenals/Urinary Tract: Both adrenal glands appear normal.

There is an infiltrative process in the lower half of the right
kidney with abnormal hypoenhancement and heterogeneity of
enhancement of the involved renal parenchyma, and indistinctness of
the cortical margin with surrounding stranding. Infiltrative process
or perirenal fluid tracks along the capsular margin of the kidney on
image [DATE]. Possibilities include pyelonephritis (and early renal
abscess is difficult to exclude), renal infarct, and infiltrative
malignancy such as renal involvement by lymphoma. No hydronephrosis
or hydroureter. No appreciable urinary tract calculi.

Stomach/Bowel: Unremarkable

Vascular/Lymphatic: Aortoiliac atherosclerotic vascular disease. No
pathologic retroperitoneal adenopathy is identified.

Reproductive: Retroverted uterus, with right fundal calcification
likely representing a calcified fibroid. Adnexa unremarkable.

Other: Perirenal stranding on the right with some adjacent fluid in
the right paracolic gutter. There is a 1.1 by 1.1 cm low-density
nodule along the inferior margin of the right hepatic lobe which
could represent complex fluid, tumor nodule, exophytic hepatic cyst,
or a small abscess. Small amount of free pelvic fluid eccentric to
the right.

Musculoskeletal: Dextroconvex lumbar scoliosis with rotary
component. Lumbar spondylosis and degenerative disc disease causing
mild right foraminal impingement at L4-5.
IMPRESSION: 1. Abnormal hypoenhancement in the parenchyma of the right kidney
lower pole compatible with infiltrative process, possibilities
include pyelonephritis (early renal abscess difficult to exclude),
renal infarct, and infiltrative malignancy such as renal lymphoma.
Correlate with urine analysis. There is potentially some fluid
tracking along the right renal capsular margin.
2. Mild splenomegaly without definite splenic focal lesion.
Splenomegaly is nonspecific but could be a corroborative finding of
lymphoma.
3. Scattered hypodense hepatic lesions are likely cysts, although
the smaller lesions are technically nonspecific.
4. The right perirenal stranding is associated with a small amount
of fluid in the adjacent right paracolic gutter, and a small fluid
collection along the inferior margin of the liver which could be an
exophytic cyst, small complex fluid collection, or conceivably a
tumor nodule.
5. Other imaging findings of potential clinical significance: Aortic
Atherosclerosis (7FSAD-C4N.N). Coronary atherosclerosis. Emphysema
(7FSAD-L25.R). Old granulomatous disease. Calcified uterine fibroid.
Right foraminal impingement at L4-5. Cholelithiasis. Mild
extrahepatic biliary dilatation.

Radiology assistant personnel have been notified to put me in
telephone contact with the referring physician or the referring
physician's clinical representative in order to discuss these
findings. Once this communication is established I will issue an
addendum to this report for documentation purposes.

## 2019-10-10 MED ORDER — ONDANSETRON HCL 4 MG/2ML IJ SOLN: 4.0000 mg | Freq: Four times a day (QID) | INTRAMUSCULAR | Status: DC | PRN

## 2019-10-10 MED ORDER — METOPROLOL TARTRATE 25 MG PO TABS
12.5000 mg | ORAL_TABLET | Freq: Two times a day (BID) | ORAL | Status: DC
  Administered 2019-10-10 – 2019-10-11 (×2): 12.5 mg via ORAL
  Filled 2019-10-10 (×2): qty 1

## 2019-10-10 MED ORDER — RIVAROXABAN 20 MG PO TABS
20.0000 mg | ORAL_TABLET | Freq: Every day | ORAL | Status: DC
  Administered 2019-10-11 – 2019-10-13 (×3): 20 mg via ORAL
  Filled 2019-10-10 (×3): qty 1

## 2019-10-10 MED ORDER — PREDNISONE 20 MG PO TABS
60.0000 mg | ORAL_TABLET | Freq: Once | ORAL | Status: AC
  Administered 2019-10-10: 60 mg via ORAL
  Filled 2019-10-10: qty 3

## 2019-10-10 MED ORDER — VITAMIN B-12 1000 MCG PO TABS
1000.0000 ug | ORAL_TABLET | Freq: Every day | ORAL | Status: DC
  Administered 2019-10-10 – 2019-10-13 (×4): 1000 ug via ORAL
  Filled 2019-10-10 (×4): qty 1

## 2019-10-10 MED ORDER — FLUTICASONE PROPIONATE 50 MCG/ACT NA SUSP
1.0000 | Freq: Every day | NASAL | Status: DC
  Administered 2019-10-10 – 2019-10-13 (×2): 1 via NASAL
  Filled 2019-10-10: qty 16

## 2019-10-10 MED ORDER — SODIUM CHLORIDE 0.9 % IV SOLN
10.0000 mL/h | Freq: Once | INTRAVENOUS | Status: AC
  Administered 2019-10-10: 10 mL/h via INTRAVENOUS

## 2019-10-10 MED ORDER — ONDANSETRON HCL 4 MG PO TABS: 4.0000 mg | ORAL_TABLET | Freq: Four times a day (QID) | ORAL | Status: DC | PRN

## 2019-10-10 MED ORDER — PREDNISONE 20 MG PO TABS
20.0000 mg | ORAL_TABLET | Freq: Once | ORAL | Status: AC
  Administered 2019-10-10: 20 mg via ORAL
  Filled 2019-10-10: qty 1

## 2019-10-10 MED ORDER — FOLIC ACID 1 MG PO TABS: 2.0000 mg | ORAL_TABLET | Freq: Every day | ORAL | Status: DC

## 2019-10-10 MED ORDER — ACETAMINOPHEN 325 MG PO TABS
650.0000 mg | ORAL_TABLET | Freq: Four times a day (QID) | ORAL | Status: DC | PRN
  Administered 2019-10-11 (×2): 650 mg via ORAL
  Filled 2019-10-10 (×2): qty 2

## 2019-10-10 MED ORDER — B COMPLEX-C PO TABS
1.0000 | ORAL_TABLET | Freq: Every day | ORAL | Status: DC
  Administered 2019-10-10 – 2019-10-13 (×4): 1 via ORAL
  Filled 2019-10-10 (×4): qty 1

## 2019-10-10 MED ORDER — GUAIFENESIN ER 600 MG PO TB12
600.0000 mg | ORAL_TABLET | Freq: Two times a day (BID) | ORAL | Status: DC
  Administered 2019-10-10 – 2019-10-13 (×4): 600 mg via ORAL
  Filled 2019-10-10 (×6): qty 1

## 2019-10-10 MED ORDER — PREDNISONE 20 MG PO TABS
80.0000 mg | ORAL_TABLET | Freq: Every day | ORAL | Status: DC
  Administered 2019-10-11 – 2019-10-13 (×3): 80 mg via ORAL
  Filled 2019-10-10 (×3): qty 4

## 2019-10-10 MED ORDER — PANTOPRAZOLE SODIUM 40 MG PO TBEC: 40.0000 mg | DELAYED_RELEASE_TABLET | Freq: Every day | ORAL | Status: DC

## 2019-10-10 MED ORDER — ACETAMINOPHEN 650 MG RE SUPP: 650.0000 mg | Freq: Four times a day (QID) | RECTAL | Status: DC | PRN

## 2019-10-10 MED ORDER — FOLIC ACID 1 MG PO TABS
5.0000 mg | ORAL_TABLET | Freq: Every day | ORAL | Status: DC
  Administered 2019-10-11 – 2019-10-13 (×3): 5 mg via ORAL
  Filled 2019-10-10 (×3): qty 5

## 2019-10-10 MED ORDER — PREDNISONE 20 MG PO TABS: 60.0000 mg | ORAL_TABLET | Freq: Every day | ORAL | Status: DC

## 2019-10-10 MED ORDER — PANTOPRAZOLE SODIUM 40 MG PO TBEC
40.0000 mg | DELAYED_RELEASE_TABLET | Freq: Every day | ORAL | Status: DC
  Administered 2019-10-11 – 2019-10-12 (×2): 40 mg via ORAL
  Filled 2019-10-10 (×2): qty 1

## 2019-10-10 MED ORDER — BENZONATATE 100 MG PO CAPS
200.0000 mg | ORAL_CAPSULE | Freq: Two times a day (BID) | ORAL | Status: DC
  Administered 2019-10-10 – 2019-10-13 (×6): 200 mg via ORAL
  Filled 2019-10-10 (×6): qty 2

## 2019-10-10 MED ORDER — FENOFIBRATE 160 MG PO TABS
160.0000 mg | ORAL_TABLET | Freq: Every day | ORAL | Status: DC
  Administered 2019-10-11 – 2019-10-13 (×3): 160 mg via ORAL
  Filled 2019-10-10 (×4): qty 1

## 2019-10-10 NOTE — ED Triage Notes (Signed)
Patient here from PCP with complaints of SOB x1 week. Hx of a-fib. Reports being seen on 12/16 for same and received blood transfusion. Persistent, dry cough. COVID neg on 12/16.

## 2019-10-10 NOTE — Consult Note (Addendum)
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  She has required intermittent blood transfusions for her anemia.  She states that she was recently on prednisone which was tapered and stopped about 2 weeks ago.  Her last PRBC transfusion was on 10/05/2019.  She presented to the emergency room today with a 1 week history of worsening shortness of breath and nonproductive cough.  She was seen in the emergency room on 10/03/2019 for cough and shortness of breath.  She was prescribed Tessalon Perles.  Her symptoms have not improved and she was seen by her primary care provider earlier today who recommended ER evaluation.  Labs in the emergency room showed a hemoglobin of 6.5, PT 26.9, INR 2.5, BUN 25, creatinine 1.06, total bilirubin 5.9, BNP 130.7, COVID-19 testing is pending.  When seen today, the patient reports increased fatigue/weakness, nonproductive cough, and feeling very short of breath.  She is not currently reporting any headaches or vision changes.  She has not had any fevers or chills.  She denies chest discomfort.  Denies abdominal pain, nausea, vomiting, constipation, diarrhea.  She has not noticed any increased bruising or bleeding.  Hematology was asked see the patient to make recommendations regarding her warm antibody hemolytic anemia.  Past Medical History:  Diagnosis Date  . Chronic lower back pain   . Hypercholesteremia   . Hypertension    mild  . Obesity   . Paroxysmal atrial fibrillation (HCC)    managed with anticoagulation and rate control.   . Stroke [facility]) 2001 & 2015   "right side gets weak sometimes if I'm only tired"  (03/16/2016)  :    Past Surgical History:  Procedure Laterality Date  . CARDIAC CATHETERIZATION  03/31/2000   EF 49%/LAD lg vessel which coursed to the apex & gave rise to 2 diagonal branches/ LAD noted to have 30% ostial lesion & tapers to a sm vessel toward the apex but no high grade lesion/1st diagonal med sized vessel & 2nd diagonal sm vessel with no significant disease/ lt ventriculogram reveals mild global hypokinesis w/ an estimated EF of 45-50%  . CARDIAC CATHETERIZATION N/A 03/17/2016   Procedure: Left Heart Cath and Coronary Angiography;  Surgeon: Wellington Hampshire, MD;  Location: [facility];  Service: Cardiovascular;  Laterality: N/A;  . LEFT HEART CATH AND CORONARY ANGIOGRAPHY N/A 03/31/2018   Procedure: LEFT HEART CATH AND CORONARY ANGIOGRAPHY;  Surgeon: Martinique, Peter M, MD;  Location: [facility];  Service: Cardiovascular;  Laterality: N/A;  :   CURRENT MEDS: Current Facility-Administered Medications  Medication Dose Route Frequency Provider Last Rate Last Admin  . 0.9 %  sodium chloride infusion  10 mL/hr Intravenous Once Quintella Reichert, MD   Stopped at 10/10/19 1356  . [START ON 10/11/2019] pantoprazole (PROTONIX) EC tablet 40 mg  40 mg Oral Q1200 Domenic Polite, MD      . Derrill Memo ON 10/11/2019] predniSONE (DELTASONE) tablet 60 mg  60 mg Oral Q breakfast Curcio, Roselie Awkward, NP       Current Outpatient Medications  Medication Sig Dispense Refill  . Ascorbic Acid (VITAMIN C PO) Take 1 tablet by mouth daily.    [name] Kitchen b complex  vitamins capsule Take 1 capsule by mouth daily.     . benzonatate (TESSALON) 100 MG capsule Take 1 capsule (100 mg total) by mouth every 8 (eight) hours as needed for cough. 21 capsule 0  . cholecalciferol (VITAMIN D) 1000 UNITS tablet Take 1,000 Units by mouth daily.    . cyanocobalamin 1000 MCG tablet Take 1,000 mcg by mouth daily.     . fenofibrate 160 MG tablet Take 1 tablet (160 mg total) by mouth daily. 90 tablet 3  . fluticasone  (FLONASE) 50 MCG/ACT nasal spray Place 1 spray into both nostrils daily.    . folic acid (FOLVITE) 1 MG tablet Take 2 tablets (2 mg total) by mouth daily. 60 tablet 22  . metoprolol tartrate (LOPRESSOR) 25 MG tablet Take 0.5 tablets (12.5 mg total) by mouth 2 (two) times daily. (Patient taking differently: Take 25 mg by mouth daily. ) 90 tablet 3  . XARELTO 20 MG TABS tablet TAKE 1 TABLET BY MOUTH ONCE DAILY (Patient taking differently: Take 20 mg by mouth daily. ) 30 tablet 5  . Cyanocobalamin (B-12) 1000 MCG SUBL Place 1,000 mcg under the tongue daily. (Patient not taking: Reported on 10/10/2019) 30 each 3  . esomeprazole (NEXIUM) 40 MG capsule TAKE 1 CAPSULE(40 MG) BY MOUTH DAILY BEFORE BREAKFAST (Patient not taking: Reported on 10/10/2019) 90 capsule 0      Allergies  Allergen Reactions  . Sulfonamide Derivatives Anaphylaxis  . Lipitor [Atorvastatin] Other (See Comments)    Caused bladder infection  . Pravachol [Pravastatin Sodium] Other (See Comments)    Myalgias  . Statins Other (See Comments)    Myalgia  :  Family History  Problem Relation Age of Onset  . Heart disease Father   . Asthma Father   . Diabetes Father   . Heart disease Mother   . Heart disease Brother   . Hyperlipidemia Sister   . Hyperlipidemia Brother   . Hyperlipidemia Brother   . Cancer Neg Hx   :  Social History   Socioeconomic History  . Marital status: Married    Spouse name: Not on file  . Number of children: 2  . Years of education: Not on file  . Highest education level: Not on file  Occupational History  . Occupation: Herbalist  Tobacco Use  . Smoking status: Former Smoker    Packs/day: 0.12    Years: 15.00    Pack years: 1.80    Types: Cigarettes    Quit date: 10/18/1978    Years since quitting: 41.0  . Smokeless tobacco: Never Used  Substance and Sexual Activity  . Alcohol use: Yes    Alcohol/week: 14.0 standard drinks    Types: 14 Glasses of wine per week  . Drug use: No   . Sexual activity: Yes  Other Topics Concern  . Not on file  Social History Narrative  . Not on file   Social Determinants of Health   Financial Resource Strain:   . Difficulty of Paying Living Expenses: Not on file  Food Insecurity:   . Worried About Charity fundraiser in the Last Year: Not on file  . Ran Out of Food in the Last Year: Not on file  Transportation Needs:   . Lack of Transportation (Medical): Not on file  . Lack of Transportation (Non-Medical): Not on file  Physical Activity:   . Days of Exercise per Week: Not on file  . Minutes of Exercise per Session: Not on file  Stress:   .  Feeling of Stress : Not on file  Social Connections:   . Frequency of Communication with Friends and Family: Not on file  . Frequency of Social Gatherings with Friends and Family: Not on file  . Attends Religious Services: Not on file  . Active Member of Clubs or Organizations: Not on file  . Attends Archivist Meetings: Not on file  . Marital Status: Not on file  Intimate Partner Violence:   . Fear of Current or Ex-Partner: Not on file  . Emotionally Abused: Not on file  . Physically Abused: Not on file  . Sexually Abused: Not on file  :  REVIEW OF SYSTEMS: A comprehensive 14 point review of systems was negative except as noted in the HPI.  Exam: Patient Vitals for the past 24 hrs:  BP Temp Temp src Pulse Resp SpO2 Height Weight  10/10/19 1509 107/73 - - (!) 126 15 100 % - -  10/10/19 1435 (!) 104/56 - - (!) 103 (!) 21 97 % - -  10/10/19 1403 (!) 102/59 - - (!) 107 18 100 % - -  10/10/19 1330 102/67 - - (!) 109 20 99 % - -  10/10/19 1300 (!) 100/57 - - (!) 106 17 100 % - -  10/10/19 1255 104/73 - - (!) 108 19 100 % - -  10/10/19 1200 106/77 - - (!) 105 17 100 % - -  10/10/19 1155 - - - - - - 5\' 6"  (1.676 m) 177 lb (80.3 kg)  10/10/19 1131 106/80 97.9 F (36.6 C) Oral (!) 116 20 100 % - -    General: Alert, conversant, has difficulty speaking due to dry cough but  in no acute distress Eyes: Conjunctiva with pallor, mild scleral icterus  ENT: No thrush or mucositis Lymphatics:  Negative cervical, supraclavicular or axillary adenopathy.  Respiratory: Clear to auscultation bilaterally Cardiovascular: Tachycardic, regular rhythm, no murmurs GI:  abdomen was soft, flat, nontender, nondistended, without organomegaly.   Musculoskeletal: Moves all extremities x4 Skin exam was without ecchymosis, petechiae.   Neuro exam was nonfocal. Patient was alert and oriented.  Attention was good.   Language was appropriate.  Mood was normal without depression.  Speech was not pressured.  Thought content was not tangential.    LABS:  Lab Results  Component Value Date   WBC 5.1 10/10/2019   HGB 6.5 (LL) 10/10/2019   HCT 19.7 (L) 10/10/2019   PLT 397 10/10/2019   GLUCOSE 128 (H) 10/10/2019   CHOL 201 (H) 03/27/2018   TRIG 75 03/27/2018   HDL 51 03/27/2018   LDLDIRECT 163.2 01/19/2013   LDLCALC 135 (H) 03/27/2018   ALT 24 10/10/2019   AST 32 10/10/2019   NA 140 10/10/2019   K 3.6 10/10/2019   CL 107 10/10/2019   CREATININE 1.06 (H) 10/10/2019   BUN 25 (H) 10/10/2019   CO2 22 10/10/2019   INR 2.5 (H) 10/10/2019   HGBA1C 5.6 01/11/2014    DG Chest Port 1 View  Result Date: 10/10/2019 CLINICAL DATA:  Shortness of breath for 1 week, atrial fibrillation, persistent dry cough, COVID-19 negative on 10/03/2019 EXAM: PORTABLE CHEST 1 VIEW COMPARISON:  Portable exam 1200 hours compared to 10/03/2019 FINDINGS: Normal heart size, mediastinal contours, and pulmonary vascularity. Lungs clear. No pleural effusion or pneumothorax. Bones appear demineralized. IMPRESSION: No acute abnormalities. Electronically Signed   By: Lavonia Dana M.D.   On: 10/10/2019 12:20   DG Chest Portable 1 View  Result Date: 10/03/2019 CLINICAL  DATA:  Cough, shortness of breath EXAM: PORTABLE CHEST 1 VIEW COMPARISON:  04/11/2018 FINDINGS: Heart and mediastinal contours are within normal limits.  No focal opacities or effusions. No acute bony abnormality. IMPRESSION: No active disease. Electronically Signed   By: Rolm Baptise M.D.   On: 10/03/2019 17:44    ASSESSMENT AND PLAN:  73 y.o. female with  1. 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 No overt evidence of bleeding.  On initial presentation the patient had labs which showed hemoglobin of 6.6.5 with an MCV of 96.5.  Normal WBC count of 7.2k with elevated platelets of 685k which on repeat were down to 573k. Reticulocyte counts were relatively suppressed. LDH was elevated to 517 but haptoglobin was within normal limits at 45. Coombs negative Myeloma panel showed no M spike Ferritin level was elevated to 845 suggestive of acute phase reaction/inflammation. B12 and folate within normal limits.  05/24/18 BM Bx results which revealed erythroid and megakaryocytic proliferation and ring sideroblasts concerning for myeloproliferative/ myelodysplastic neoplasm   No excess blasts; Acute leukemia and lymphoma are not indicated   2. Rt renal radiographic abnormality as noted in CT with some fluid collection. 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 No fevers chills to suggest acute pyelonephritis or renal abscess. Urine analysis showed trace leuk esterase and 6-10 WBCs no significant hematuria. Broad differential diagnosis based on CT findings as per radiologist. Procalcitonin levels unimpressive  05/23/18 CT Chest Abdomen Pelvis was done which showed mild splenomegaly without a focal splenic lesion. Multiple hepatic cysts.  Abnormal hypoenhancement in the right kidney lower pole of undetermined etiology -broad differential based on radiology input. right perirenal stranding is associated with a small amount of fluid in the adjacent right paracolic gutter, and a small fluid collection along the inferior margin of the liver which could be an exophytic  cyst, small complex fluid collection, or conceivably a tumor nodule. No other significant other lymphadenopathy noted.   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 Warm Antibody hemolytic Anemia  PLAN: -Discussed pt labwork today, 10/10/2019; all values within normal limits except for RBC 2.04, hemoglobin 6.5, hematocrit 19.7, RDW 19.1, glucose 128, BUN 25, creatinine 1.06, calcium 8.8, total bilirubin 5.9, BNP 130.7, PT 26.9, INR 2.5.  LDH has been ordered but has not yet been drawn. -Suspect that she has developed worsening hemolysis. -Recommend transfusion of 2 units PRBCs today. -We will restart the patient on prednisone 60 mg daily. -We have previously discussed additional treatment options including adding aranesp, every two weeks, to help improve hemoglobin levels and decrease the amount of transfusions needed or to continue to monitor levels.  This has not yet been started.  We can consider either starting this as an inpatient versus starting it upon discharge.   4.  Nonproductive cough Unclear etiology.  She has a normal chest x-ray.  COVID-19 testing 1-week ago and repeat is pending today.  Influenza panel -1-week ago.  PLAN: -Await repeat COVID-19 testing                                                                                                                     -  viral respiratory panel add -- other virus could also be  -PPI added by hospitalist -Continue supportive care with Ladona Ridgel and Mucinex                                                                                                        Thank you for this referral.  Mikey Bussing, DNP, AGPCNP-BC, AOCNP    ADDENDUM   .Patient was Personally and independently interviewed, examined and relevant elements of the history of present illness were reviewed in details and an assessment and plan was  created. All elements of the patient's history of present illness , assessment and plan were discussed in details with Mikey Bussing, DNP . The above documentation reflects our combined findings assessment and plan.   Patient is complicated from a hematology standpoint with initial diagnosis of MDS (RARS-T) with recent diagnosis of Warm antibody hemolytic Anemia She is s/p initial steroid taper + Rituxan x4 without remission of Warm AIHA. She has worsening hemolysis with taper of her prednisone and also more pronounced anemia given her underlying MDS.  Patient notes a non productive cough as part of her symptomatology r/o viral infection or other atypical infection (patient immunosupressed) this could worsen anemia further if present. CXR - NAI  She is in afib with RVR - which is adding to her SOB.  PLAN -transfuse most compatible available PRBC as needed for HGB<7.5 or if sympatomatic -mx of afib with RVR per hospitalist -undergoing covid testing, respiratory viral panel. - will restart Prednisone @ 1mg /kg 80mg  po daily with slow taper over [id number]. -high dose folic acid and B complex to support accelerated hemolysis -discussed and received consent for IVIG -- we discussed its not as effective as in ITP but since she is having significant hemolysis with her last PRBC transfusion 6 days ago with 2 units did not give much bump in hgb levels It might help reduce extent of hemolysis. - discussed that typical option for using immunosupressants for steroid/rituxan resistant warm AIHA are complicated to use in her case since they can cause additional supression of her BM in setting of already having MDS. -would re-use Rituxan weekly (early next year) vs consider Vincristine vs low dose cellcept. -will need to set for labs and transfusion support in 1 week upon discharge. -will need to shows hgb stability for atleast 1-2 days and stabilization of symptoms prior to discharge.  Sullivan Lone MD MS

## 2019-10-10 NOTE — ED Provider Notes (Signed)
[facility] DEPT Provider Note   CSN: [id number] Arrival date & time: 10/10/19  1113     History Chief Complaint  Patient presents with  . Shortness of Breath    [name] is a 73 y.o. female.  The history is provided by the patient, medical records and the EMS personnel. No language interpreter was used.   ZURIAH Pierce is a 73 y.o. female who presents to the Emergency Department complaining of sob. She presents the emergency department by EMS complaining of shortness of breath, cough and dyspnea on exertion for the last week. Symptoms were gradual and onset and are severe in nature. She has a history of chronic atrial fibrillation on Xarelto as well as myelodysplastic syndrome requiring transfusions about every month. Her last transfusion was on December 18. Symptoms began after the transfusion. She has not had similar shortness of breath in the past. She denies any blocker blood he stools. She denies any fevers, chest pain, abdominal pain, leg swelling or pain. No loss of taste or smell. No known COVID 19 exposures. She went to her PCPs office today to have blood drawn and 911 was called due to difficulty breathing.    Past Medical History:  Diagnosis Date  . Chronic lower back pain   . Hypercholesteremia   . Hypertension    mild  . Obesity   . Paroxysmal atrial fibrillation (HCC)    managed with anticoagulation and rate control.   . Stroke [facility]) 2001 & 2015   "right side gets weak sometimes if I'm only tired" (03/16/2016)    Patient Active Problem List   Diagnosis Date Noted  . Anemia 10/10/2019  . 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 Counseling regarding advance care planning and goals of care 08/16/2019  . Elevated serum lactate dehydrogenase (LDH)   . Renal lesion   . Syncope 05/22/2018  . Symptomatic anemia 05/22/2018  . Unstable angina ([place])   . Chest  pain 03/16/2016  . ARF (acute renal failure) ([place]) 03/16/2016  . Coronary artery disease involving native coronary artery of native heart with unstable angina pectoris ([place])   . Acute CVA (cerebrovascular accident) ([place]) 01/12/2014  . TIA (transient ischemic attack) 01/11/2014  . COLONIC POLYPS 12/15/2007  . Hyperlipidemia 12/15/2007  . INTERNAL HEMORRHOIDS WITHOUT MENTION COMP 12/15/2007  . SINUSITIS, CHRONIC 12/15/2007  . CONSTIPATION 12/15/2007  . RECTAL BLEEDING 12/15/2007  . OSTEOARTHRITIS 12/15/2007  . Essential hypertension 07/18/2007  . Atrial fibrillation ([place]) 07/18/2007  . History of cardiovascular disorder 07/18/2007    Past Surgical History:  Procedure Laterality Date  . CARDIAC CATHETERIZATION  03/31/2000   EF 49%/LAD lg vessel which coursed to the apex & gave rise to 2 diagonal branches/ LAD noted to have 30% ostial lesion & tapers to a sm vessel toward the apex but no high grade lesion/1st diagonal med sized vessel & 2nd diagonal sm vessel with no significant disease/ lt ventriculogram reveals mild global hypokinesis w/ an estimated EF of 45-50%  . CARDIAC CATHETERIZATION N/A 03/17/2016   Procedure: Left Heart Cath and Coronary Angiography;  Surgeon: Wellington Hampshire, MD;  Location: [place] CV LAB;  Service: Cardiovascular;  Laterality: N/A;  . LEFT HEART CATH AND CORONARY ANGIOGRAPHY N/A 03/31/2018   Procedure: LEFT HEART CATH AND CORONARY ANGIOGRAPHY;  Surgeon: Martinique, Peter M, MD;  Location: [place] CV LAB;  Service: Cardiovascular;  Laterality: N/A;     OB  History   No obstetric history on file.     Family History  Problem Relation Age of Onset  . Heart disease Father   . Asthma Father   . Diabetes Father   . Heart disease Mother   . Heart disease Brother   . Hyperlipidemia Sister   . Hyperlipidemia Brother   . Hyperlipidemia Brother   . Cancer Neg Hx     Social History   Tobacco Use  . Smoking status: Former Smoker    Packs/day: 0.12     Years: 15.00    Pack years: 1.80    Types: Cigarettes    Quit date: 10/18/1978    Years since quitting: 41.0  . Smokeless tobacco: Never Used  Substance Use Topics  . Alcohol use: Yes    Alcohol/week: 14.0 standard drinks    Types: 14 Glasses of wine per week  . Drug use: No    Home Medications Prior to Admission medications   Medication Sig Start Date End Date Taking? Authorizing Provider  Ascorbic Acid (VITAMIN C PO) Take 1 tablet by mouth daily.   Yes [provider]  b complex vitamins capsule Take 1 capsule by mouth daily.    Yes [provider]  benzonatate (TESSALON) 100 MG capsule Take 1 capsule (100 mg total) by mouth every 8 (eight) hours as needed for cough. 10/03/19  Yes Corena Herter, PA-C  cholecalciferol (VITAMIN D) 1000 UNITS tablet Take 1,000 Units by mouth daily.   Yes [provider]  cyanocobalamin 1000 MCG tablet Take 1,000 mcg by mouth daily.    Yes [provider]  fenofibrate 160 MG tablet Take 1 tablet (160 mg total) by mouth daily. 02/16/19  Yes Burtis Junes, NP  fluticasone (FLONASE) 50 MCG/ACT nasal spray Place 1 spray into both nostrils daily.   Yes [provider]  folic acid (FOLVITE) 1 MG tablet Take 2 tablets (2 mg total) by mouth daily. 07/22/19  Yes Brunetta Genera, MD  metoprolol tartrate (LOPRESSOR) 25 MG tablet Take 0.5 tablets (12.5 mg total) by mouth 2 (two) times daily. Patient taking differently: Take 25 mg by mouth daily.  04/04/19  Yes Gerhardt, Marlane Hatcher, NP  XARELTO 20 MG TABS tablet TAKE 1 TABLET BY MOUTH ONCE DAILY Patient taking differently: Take 20 mg by mouth daily.  04/18/19  Yes Martinique, Peter M, MD  Cyanocobalamin (B-12) 1000 MCG SUBL Place 1,000 mcg under the tongue daily. Patient not taking: Reported on 10/10/2019 11/23/18   Brunetta Genera, MD  esomeprazole ([place]) 40 MG capsule TAKE 1 CAPSULE(40 MG) BY MOUTH DAILY BEFORE BREAKFAST Patient not taking: Reported on 10/10/2019  08/31/19   Brunetta Genera, MD    Allergies    Sulfonamide derivatives, Lipitor [atorvastatin], Pravachol [pravastatin sodium], and Statins  Review of Systems   Review of Systems  All other systems reviewed and are negative.   Physical Exam Updated Vital Signs BP 96/61   Pulse (!) 121   Temp 97.9 F (36.6 C) (Oral)   Resp 19   Ht (S) 5\' 6"  (1.676 m)   Wt (S) 80.3 kg   SpO2 100%   BMI 28.57 kg/m   Physical Exam Vitals and nursing note reviewed.  Constitutional:      Appearance: She is well-developed.     Comments: Appears uncomfortable  HENT:     Head: Normocephalic and atraumatic.  Cardiovascular:     Rate and Rhythm: Tachycardia present. Rhythm irregular.     Heart  sounds: No murmur.  Pulmonary:     Effort: Pulmonary effort is normal. No respiratory distress.     Breath sounds: Normal breath sounds.     Comments: Tachypnea Abdominal:     Palpations: Abdomen is soft.     Tenderness: There is no abdominal tenderness. There is no guarding or rebound.  Musculoskeletal:        General: No swelling or tenderness.  Skin:    General: Skin is warm and dry.     Coloration: Skin is pale.  Neurological:     Mental Status: She is alert and oriented to person, place, and time.  Psychiatric:        Behavior: Behavior normal.     ED Results / Procedures / Treatments   Labs (all labs ordered are listed, but only abnormal results are displayed) Labs Reviewed  COMPREHENSIVE METABOLIC PANEL - Abnormal; Notable for the following components:      Result Value   Glucose, Bld 128 (*)    BUN 25 (*)    Creatinine, Ser 1.06 (*)    Calcium 8.8 (*)    Total Bilirubin 5.9 (*)    GFR calc non Af Amer 52 (*)    GFR calc Af Amer 60 (*)    All other components within normal limits  BRAIN NATRIURETIC PEPTIDE - Abnormal; Notable for the following components:   B Natriuretic Peptide 130.7 (*)    All other components within normal limits  CBC WITH DIFFERENTIAL/PLATELET -  Abnormal; Notable for the following components:   RBC 2.04 (*)    Hemoglobin 6.5 (*)    HCT 19.7 (*)    RDW 19.1 (*)    nRBC 2.3 (*)    Abs Immature Granulocytes 0.08 (*)    All other components within normal limits  PROTIME-INR - Abnormal; Notable for the following components:   Prothrombin Time 26.9 (*)    INR 2.5 (*)    All other components within normal limits  SARS CORONAVIRUS 2 (TAT 6-24 HRS)  TYPE AND SCREEN  PREPARE RBC (CROSSMATCH)  TROPONIN I (HIGH SENSITIVITY)  TROPONIN I (HIGH SENSITIVITY)    EKG None  Radiology DG Chest Port 1 View  Result Date: 10/10/2019 CLINICAL DATA:  Shortness of breath for 1 week, atrial fibrillation, persistent dry cough, COVID-19 negative on 10/03/2019 EXAM: PORTABLE CHEST 1 VIEW COMPARISON:  Portable exam 1200 hours compared to 10/03/2019 FINDINGS: Normal heart size, mediastinal contours, and pulmonary vascularity. Lungs clear. No pleural effusion or pneumothorax. Bones appear demineralized. IMPRESSION: No acute abnormalities. Electronically Signed   By: Lavonia Dana M.D.   On: 10/10/2019 12:20    Procedures Procedures (including critical care time) CRITICAL CARE Performed by: Quintella Reichert   Total critical care time: 45 minutes  Critical care time was exclusive of separately billable procedures and treating other patients.  Critical care was necessary to treat or prevent imminent or life-threatening deterioration.  Critical care was time spent personally by me on the following activities: development of treatment plan with patient and/or surrogate as well as nursing, discussions with consultants, evaluation of patient's response to treatment, examination of patient, obtaining history from patient or surrogate, ordering and performing treatments and interventions, ordering and review of laboratory studies, ordering and review of radiographic studies, pulse oximetry and re-evaluation of patient's condition.  Medications Ordered in  ED Medications  0.9 %  sodium chloride infusion (0 mL/hr Intravenous Hold 10/10/19 1356)  predniSONE (DELTASONE) tablet 60 mg (has no administration in time range)  pantoprazole (PROTONIX) EC tablet 40 mg (has no administration in time range)  predniSONE (DELTASONE) tablet 60 mg (60 mg Oral Given 10/10/19 1410)    ED Course  I have reviewed the triage vital signs and the nursing notes.  Pertinent labs & imaging results that were available during my care of the patient were reviewed by me and considered in my medical decision making (see chart for details).    MDM Rules/Calculators/A&P                      Patient here for evaluation of shortness of breath, has a history of hemolytic anemia as well as myelodysplastic syndrome. She is uncomfortable appearing on evaluation. She was placed on oxygen for comfort. Labs today demonstrate worsening anemia with hemoglobin of 6.5. Discussed with Dr. Irene Limbo with hematology oncology, recommends blood transfusion and starting prednisone, 60 mg daily. Discussed with patient findings of studies recommendation for transfusion and admission and she is in agreement treatment plan. Hospitalist consulted for admission for further treatment.  Final Clinical Impression(s) / ED Diagnoses Final diagnoses:  Warm antibody hemolytic anemia    Rx / DC Orders ED Discharge Orders    None       Quintella Reichert, MD 10/10/19 [phone]

## 2019-10-10 NOTE — ED Notes (Signed)
Called and confirmed with Blood Bank, Patient RBC's will need to be ordered and Blood Bank will call RN once blood has ETA to [facility] and once blood arrives at [facility].

## 2019-10-10 NOTE — H&P (Addendum)
History and Physical    Alexandra Pierce [id number] DOB: [birth year] DOA: 10/10/2019  Referring MD/NP/PA: EDP PCP:  Patient coming from: Home  Chief Complaint: Shortness of breath, cough  HPI: Alexandra Pierce is a 73 y.o. female with medical history significant of paroxysmal atrial fibrillation on Xarelto, MDS/refractory anemia with ringed sideroblasts and warm antibody hemolytic anemia, ongoing anemia requiring intermittent blood transfusions presented to the ER with shortness of breath for 1 week and persistent dry cough.  Patient reports that she requires intermittent transfusions for her anemia and last transfusion approximately 2 weeks ago, she used to be on prednisone for hemolytic anemia, her oncologist weaned her off prednisone about 2 weeks ago . she developed a cough over a week ago with some shortness of breath was seen in the emergency room on 12/16 for this, she tested negative for Covid and influenza, hemoglobin was 6.7 at the time she was discharged home with recommendation to follow-up with Dr. Irene Limbo tomorrow for blood transfusion at the cancer center, to her PCPs office where she was noted to be tachypneic, they sent her to the ED this afternoon. -Patient reports persistent dry cough for 7 to 10 days, progressive dyspnea for 1 week, no leg swelling, no orthopnea or PND, no fevers or chills ED Course: Noted to be in sinus tach, hemoglobin 6.5, chest x-ray clear, COVID-19 PCR pending  Review of Systems: As per HPI otherwise 14 point review of systems negative.   Past Medical History:  Diagnosis Date  . Chronic lower back pain   . Hypercholesteremia   . Hypertension    mild  . Obesity   . Paroxysmal atrial fibrillation (HCC)    managed with anticoagulation and rate control.   . Stroke [facility]) 2001 & 2015   "right side gets weak sometimes if I'm only tired" (03/16/2016)    Past Surgical History:  Procedure Laterality Date  . CARDIAC CATHETERIZATION  03/31/2000   EF 49%/LAD lg  vessel which coursed to the apex & gave rise to 2 diagonal branches/ LAD noted to have 30% ostial lesion & tapers to a sm vessel toward the apex but no high grade lesion/1st diagonal med sized vessel & 2nd diagonal sm vessel with no significant disease/ lt ventriculogram reveals mild global hypokinesis w/ an estimated EF of 45-50%  . CARDIAC CATHETERIZATION N/A 03/17/2016   Procedure: Left Heart Cath and Coronary Angiography;  Surgeon: Wellington Hampshire, MD;  Location: [place] CV LAB;  Service: Cardiovascular;  Laterality: N/A;  . LEFT HEART CATH AND CORONARY ANGIOGRAPHY N/A 03/31/2018   Procedure: LEFT HEART CATH AND CORONARY ANGIOGRAPHY;  Surgeon: Martinique, Peter M, MD;  Location: [place] CV LAB;  Service: Cardiovascular;  Laterality: N/A;     reports that she quit smoking about 41 years ago. Her smoking use included cigarettes. She has a 1.80 pack-year smoking history. She has never used smokeless tobacco. She reports current alcohol use of about 14.0 standard drinks of alcohol per week. She reports that she does not use drugs.  Allergies  Allergen Reactions  . Sulfonamide Derivatives Anaphylaxis  . Lipitor [Atorvastatin] Other (See Comments)    Caused bladder infection  . Pravachol [Pravastatin Sodium] Other (See Comments)    Myalgias  . Statins Other (See Comments)    Myalgia    Family History  Problem Relation Age of Onset  . Heart disease Father   . Asthma Father   . Diabetes Father   . Heart disease Mother   . Heart  disease Brother   . Hyperlipidemia Sister   . Hyperlipidemia Brother   . Hyperlipidemia Brother   . Cancer Neg Hx      Prior to Admission medications   Medication Sig Start Date End Date Taking? Authorizing Provider  Ascorbic Acid (VITAMIN C PO) Take 1 tablet by mouth daily.   Yes [provider]  b complex vitamins capsule Take 1 capsule by mouth daily.    Yes [provider]  benzonatate (TESSALON) 100 MG capsule Take 1 capsule (100 mg  total) by mouth every 8 (eight) hours as needed for cough. 10/03/19  Yes Corena Herter, PA-C  cholecalciferol (VITAMIN D) 1000 UNITS tablet Take 1,000 Units by mouth daily.   Yes [provider]  cyanocobalamin 1000 MCG tablet Take 1,000 mcg by mouth daily.    Yes [provider]  fenofibrate 160 MG tablet Take 1 tablet (160 mg total) by mouth daily. 02/16/19  Yes Burtis Junes, NP  fluticasone (FLONASE) 50 MCG/ACT nasal spray Place 1 spray into both nostrils daily.   Yes [provider]  folic acid (FOLVITE) 1 MG tablet Take 2 tablets (2 mg total) by mouth daily. 07/22/19  Yes Brunetta Genera, MD  metoprolol tartrate (LOPRESSOR) 25 MG tablet Take 0.5 tablets (12.5 mg total) by mouth 2 (two) times daily. Patient taking differently: Take 25 mg by mouth daily.  04/04/19  Yes Gerhardt, Marlane Hatcher, NP  XARELTO 20 MG TABS tablet TAKE 1 TABLET BY MOUTH ONCE DAILY Patient taking differently: Take 20 mg by mouth daily.  04/18/19  Yes Martinique, Peter M, MD  Cyanocobalamin (B-12) 1000 MCG SUBL Place 1,000 mcg under the tongue daily. Patient not taking: Reported on 10/10/2019 11/23/18   Brunetta Genera, MD  esomeprazole (NEXIUM) 40 MG capsule TAKE 1 CAPSULE(40 MG) BY MOUTH DAILY BEFORE BREAKFAST Patient not taking: Reported on 10/10/2019 08/31/19   Brunetta Genera, MD    Physical Exam: Vitals:   10/10/19 1330 10/10/19 1403 10/10/19 1435 10/10/19 1509  BP: 102/67 (!) 102/59 (!) 104/56 107/73  Pulse: (!) 109 (!) 107 (!) 103 (!) 126  Resp: 20 18 (!) 21 15  Temp:      TempSrc:      SpO2: 99% 100% 97% 100%  Weight:      Height:          Constitutional: Overweight pleasant female sitting up in bed, slightly uncomfortable appearing, AAO x3 Vitals:   10/10/19 1330 10/10/19 1403 10/10/19 1435 10/10/19 1509  BP: 102/67 (!) 102/59 (!) 104/56 107/73  Pulse: (!) 109 (!) 107 (!) 103 (!) 126  Resp: 20 18 (!) 21 15  Temp:      TempSrc:      SpO2: 99% 100% 97% 100%    Weight:      Height:       HEENT, mild scleral icterus noted and pallor Respiratory: Clear bilaterally, no wheezes Cardiovascular: Regular rhythm, tachycardic Abdomen: soft, non tender, Bowel sounds positive.  Musculoskeletal: No joint deformity upper and lower extremities. Ext: No edema Skin: no rashes, lesions, ulcers.  Neurologic: Moves all extremities, no localizing signs Psychiatric: Normal judgment and insight. Alert and oriented x 3. Normal mood.   Labs on Admission: I have personally reviewed following labs and imaging studies  CBC: Recent Labs  Lab 10/04/19 1351 10/10/19 1154  WBC 5.4 5.1  NEUTROABS 3.7 3.8  HGB 6.7* 6.5*  HCT 19.9* 19.7*  MCV 97.1 96.6  PLT 346 397   Basic  Metabolic Panel: Recent Labs  Lab 10/04/19 1351 10/10/19 1154  NA 143 140  K 4.0 3.6  CL 109 107  CO2 21* 22  GLUCOSE 112* 128*  BUN 23 25*  CREATININE 1.02* 1.06*  CALCIUM 8.5* 8.8*   GFR: Estimated Creatinine Clearance: 49.8 mL/min (A) (by C-G formula based on SCr of 1.06 mg/dL (H)). Liver Function Tests: Recent Labs  Lab 10/04/19 1351 10/10/19 1154  AST 43* 32  ALT 32 24  ALKPHOS 47 40  BILITOT 5.3* 5.9*  PROT 6.5 6.5  ALBUMIN 3.9 3.9   No results for input(s): LIPASE, AMYLASE in the last 168 hours. No results for input(s): AMMONIA in the last 168 hours. Coagulation Profile: Recent Labs  Lab 10/10/19 1154  INR 2.5*   Cardiac Enzymes: No results for input(s): CKTOTAL, CKMB, CKMBINDEX, TROPONINI in the last 168 hours. BNP (last 3 results) No results for input(s): PROBNP in the last 8760 hours. HbA1C: No results for input(s): HGBA1C in the last 72 hours. CBG: No results for input(s): GLUCAP in the last 168 hours. Lipid Profile: No results for input(s): CHOL, HDL, LDLCALC, TRIG, CHOLHDL, LDLDIRECT in the last 72 hours. Thyroid Function Tests: No results for input(s): TSH, T4TOTAL, FREET4, T3FREE, THYROIDAB in the last 72 hours. Anemia Panel: No results for  input(s): VITAMINB12, FOLATE, FERRITIN, TIBC, IRON, RETICCTPCT in the last 72 hours. Urine analysis:    Component Value Date/Time   COLORURINE YELLOW 05/23/2018 1941   APPEARANCEUR CLEAR 05/23/2018 1941   LABSPEC 1.028 05/23/2018 1941   PHURINE 7.0 05/23/2018 1941   GLUCOSEU NEGATIVE 05/23/2018 1941   HGBUR SMALL (A) 05/23/2018 1941   BILIRUBINUR NEGATIVE 05/23/2018 1941   KETONESUR NEGATIVE 05/23/2018 1941   PROTEINUR NEGATIVE 05/23/2018 1941   UROBILINOGEN 0.2 01/11/2014 1200   NITRITE NEGATIVE 05/23/2018 1941   LEUKOCYTESUR TRACE (A) 05/23/2018 1941   Sepsis Labs: @LABRCNTIP (procalcitonin:4,lacticidven:4) ) Recent Results (from the past 240 hour(s))  SARS CORONAVIRUS 2 (TAT 6-24 HRS) Nasopharyngeal Nasopharyngeal Swab     Status: None   Collection Time: 10/03/19  6:54 PM   Specimen: Nasopharyngeal Swab  Result Value Ref Range Status   SARS Coronavirus 2 NEGATIVE NEGATIVE Final    Comment: (NOTE) SARS-CoV-2 target nucleic acids are NOT DETECTED. The SARS-CoV-2 RNA is generally detectable in upper and lower respiratory specimens during the acute phase of infection. Negative results do not preclude SARS-CoV-2 infection, do not rule out co-infections with other pathogens, and should not be used as the sole basis for treatment or other patient management decisions. Negative results must be combined with clinical observations, patient history, and epidemiological information. The expected result is Negative. Fact Sheet for Patients: SugarRoll.be Fact Sheet for Healthcare Providers: https://www.woods-mathews.com/ This test is not yet approved or cleared by the [place] FDA and  has been authorized for detection and/or diagnosis of SARS-CoV-2 by FDA under an Emergency Use Authorization (EUA). This EUA will remain  in effect (meaning this test can be used) for the duration of the COVID-19 declaration under Section 56 4(b)(1) of the  Act, 21 U.S.C. section 360bbb-3(b)(1), unless the authorization is terminated or revoked sooner. Performed at [facility], [place] [address] [place], [place] [address]      Radiological Exams on Admission: DG Chest Port 1 View  Result Date: 10/10/2019 CLINICAL DATA:  Shortness of breath for 1 week, atrial fibrillation, persistent dry cough, COVID-19 negative on 10/03/2019 EXAM: PORTABLE CHEST 1 VIEW COMPARISON:  Portable exam 1200 hours compared to 10/03/2019 FINDINGS: Normal  heart size, mediastinal contours, and pulmonary vascularity. Lungs clear. No pleural effusion or pneumothorax. Bones appear demineralized. IMPRESSION: No acute abnormalities. Electronically Signed   By: Lavonia Dana M.D.   On: 10/10/2019 12:20    EKG: Independently reviewed.  Atrial fibrillation  Assessment/Plan  Acute on chronic anemia -Secondary to warm antibody hemolytic anemia in association with MDS/refractory anemia with ringed sideroblasts -Reportedly her blood requires prolonged processing time due to antibodies, she will be admitted, hematology consulted, 2 units of PRBC ordered by EDP -Start prednisone 60 mg daily -Monitor LDH  Dry cough -Etiology is unclear, clinically no evidence of fluid overload, COVID-19 PCR and influenza was negative on 12/17, repeat pending -Add PPI in case GERD is contributing -Supportive care, continue Tessalon Perles add Mucinex  Paroxysmal atrial fibrillation -Currently in sinus tachycardia at this time -Resume Xarelto and metoprolol 12.5 mg twice daily, may need to increase dose  MDS/ Refractory anemia with ringed sideroblasts and warm antibody hemolytic anemia -Transfusion dependent, restart prednisone as above -Per Dr.Kale  H/o TIA/CVA -Continue Xarelto  DVT prophylaxis: Continue Xarelto Code Status: DNR per patient's wishes Family Communication: No family at bedside Disposition Plan: Home if clinically improved posttransfusion Consults called: Hematology  Dr. Irene Limbo Admission status: Observation  Domenic Polite MD [facility]  10/10/2019, 3:26 PM

## 2019-10-10 NOTE — ED Notes (Signed)
[facility] 6.5, MD notified.

## 2019-10-10 NOTE — ED Notes (Signed)
RN called lab to check to see if blood has come in and blood has not. Lab stated they will call once blood has arrived.

## 2019-10-11 ENCOUNTER — Other Ambulatory Visit: Payer: Medicare Other

## 2019-10-11 ENCOUNTER — Observation Stay (HOSPITAL_BASED_OUTPATIENT_CLINIC_OR_DEPARTMENT_OTHER): Payer: Medicare Other

## 2019-10-11 DIAGNOSIS — D464 Refractory anemia, unspecified: Secondary | ICD-10-CM | POA: Diagnosis present

## 2019-10-11 DIAGNOSIS — Z8249 Family history of ischemic heart disease and other diseases of the circulatory system: Secondary | ICD-10-CM | POA: Diagnosis not present

## 2019-10-11 DIAGNOSIS — D469 Myelodysplastic syndrome, unspecified: Secondary | ICD-10-CM

## 2019-10-11 DIAGNOSIS — R0602 Shortness of breath: Secondary | ICD-10-CM | POA: Diagnosis present

## 2019-10-11 DIAGNOSIS — Z87891 Personal history of nicotine dependence: Secondary | ICD-10-CM | POA: Diagnosis not present

## 2019-10-11 DIAGNOSIS — I480 Paroxysmal atrial fibrillation: Secondary | ICD-10-CM | POA: Diagnosis not present

## 2019-10-11 DIAGNOSIS — R739 Hyperglycemia, unspecified: Secondary | ICD-10-CM | POA: Diagnosis not present

## 2019-10-11 DIAGNOSIS — I10 Essential (primary) hypertension: Secondary | ICD-10-CM | POA: Diagnosis present

## 2019-10-11 DIAGNOSIS — Z8349 Family history of other endocrine, nutritional and metabolic diseases: Secondary | ICD-10-CM | POA: Diagnosis not present

## 2019-10-11 DIAGNOSIS — Z20828 Contact with and (suspected) exposure to other viral communicable diseases: Secondary | ICD-10-CM | POA: Diagnosis present

## 2019-10-11 DIAGNOSIS — N280 Ischemia and infarction of kidney: Secondary | ICD-10-CM | POA: Diagnosis present

## 2019-10-11 DIAGNOSIS — Z882 Allergy status to sulfonamides status: Secondary | ICD-10-CM | POA: Diagnosis not present

## 2019-10-11 DIAGNOSIS — Z7901 Long term (current) use of anticoagulants: Secondary | ICD-10-CM | POA: Diagnosis not present

## 2019-10-11 DIAGNOSIS — E7800 Pure hypercholesterolemia, unspecified: Secondary | ICD-10-CM | POA: Diagnosis present

## 2019-10-11 DIAGNOSIS — D649 Anemia, unspecified: Secondary | ICD-10-CM | POA: Diagnosis not present

## 2019-10-11 DIAGNOSIS — D589 Hereditary hemolytic anemia, unspecified: Secondary | ICD-10-CM | POA: Diagnosis present

## 2019-10-11 DIAGNOSIS — Z833 Family history of diabetes mellitus: Secondary | ICD-10-CM | POA: Diagnosis not present

## 2019-10-11 DIAGNOSIS — Z888 Allergy status to other drugs, medicaments and biological substances status: Secondary | ICD-10-CM | POA: Diagnosis not present

## 2019-10-11 DIAGNOSIS — I69351 Hemiplegia and hemiparesis following cerebral infarction affecting right dominant side: Secondary | ICD-10-CM | POA: Diagnosis not present

## 2019-10-11 DIAGNOSIS — D5911 Warm autoimmune hemolytic anemia: Principal | ICD-10-CM

## 2019-10-11 DIAGNOSIS — G8929 Other chronic pain: Secondary | ICD-10-CM | POA: Diagnosis present

## 2019-10-11 DIAGNOSIS — Z79899 Other long term (current) drug therapy: Secondary | ICD-10-CM | POA: Diagnosis not present

## 2019-10-11 DIAGNOSIS — M545 Low back pain: Secondary | ICD-10-CM | POA: Diagnosis present

## 2019-10-11 DIAGNOSIS — Z825 Family history of asthma and other chronic lower respiratory diseases: Secondary | ICD-10-CM | POA: Diagnosis not present

## 2019-10-11 DIAGNOSIS — I4819 Other persistent atrial fibrillation: Secondary | ICD-10-CM | POA: Diagnosis present

## 2019-10-11 DIAGNOSIS — T380X5A Adverse effect of glucocorticoids and synthetic analogues, initial encounter: Secondary | ICD-10-CM | POA: Diagnosis not present

## 2019-10-11 DIAGNOSIS — Z66 Do not resuscitate: Secondary | ICD-10-CM | POA: Diagnosis present

## 2019-10-11 LAB — COMPREHENSIVE METABOLIC PANEL
ALT: 21 U/L (ref 0–44)
AST: 18 U/L (ref 15–41)
Albumin: 3.8 g/dL (ref 3.5–5.0)
Alkaline Phosphatase: 42 U/L (ref 38–126)
Anion gap: 9 (ref 5–15)
BUN: 29 mg/dL — ABNORMAL HIGH (ref 8–23)
CO2: 22 mmol/L (ref 22–32)
Calcium: 9.1 mg/dL (ref 8.9–10.3)
Chloride: 107 mmol/L (ref 98–111)
Creatinine, Ser: 0.86 mg/dL (ref 0.44–1.00)
GFR calc Af Amer: >60 mL/min (ref >60)
GFR calc non Af Amer: >60 mL/min (ref >60)
Glucose, Bld: 188 mg/dL — ABNORMAL HIGH (ref 70–99)
Potassium: 4.4 mmol/L (ref 3.5–5.1)
Sodium: 138 mmol/L (ref 135–145)
Total Bilirubin: 4.5 mg/dL — ABNORMAL HIGH (ref 0.3–1.2)
Total Protein: 6.2 g/dL — ABNORMAL LOW (ref 6.5–8.1)

## 2019-10-11 LAB — CBC WITH DIFFERENTIAL/PLATELET
Abs Immature Granulocytes: 0.16 10*3/uL — ABNORMAL HIGH (ref 0.00–0.07)
Basophils Absolute: 0 10*3/uL (ref 0.0–0.1)
Basophils Relative: 0 %
Eosinophils Absolute: 0 10*3/uL (ref 0.0–0.5)
Eosinophils Relative: 0 %
HCT: 24.5 % — ABNORMAL LOW (ref 36.0–46.0)
Hemoglobin: 9.1 g/dL — ABNORMAL LOW (ref 12.0–15.0)
Immature Granulocytes: 3 %
Lymphocytes Relative: 20 %
Lymphs Abs: 1.2 10*3/uL (ref 0.7–4.0)
MCH: 36.3 pg — ABNORMAL HIGH (ref 26.0–34.0)
MCHC: 37.1 g/dL — ABNORMAL HIGH (ref 30.0–36.0)
MCV: 97.6 fL (ref 80.0–100.0)
Monocytes Absolute: 0.3 10*3/uL (ref 0.1–1.0)
Monocytes Relative: 4 %
Neutro Abs: 4.3 10*3/uL (ref 1.7–7.7)
Neutrophils Relative %: 73 %
Platelets: 321 10*3/uL (ref 150–400)
RBC: 2.51 MIL/uL — ABNORMAL LOW (ref 3.87–5.11)
RDW: 18.9 % — ABNORMAL HIGH (ref 11.5–15.5)
WBC: 6 10*3/uL (ref 4.0–10.5)
nRBC: 1.2 % — ABNORMAL HIGH (ref 0.0–0.2)

## 2019-10-11 LAB — GLUCOSE, CAPILLARY
Glucose-Capillary: 219 mg/dL — ABNORMAL HIGH (ref 70–99)
Glucose-Capillary: 271 mg/dL — ABNORMAL HIGH (ref 70–99)

## 2019-10-11 LAB — RESPIRATORY PANEL BY PCR

## 2019-10-11 LAB — LACTATE DEHYDROGENASE: LDH: 627 U/L — ABNORMAL HIGH (ref 98–192)

## 2019-10-11 LAB — HEMOGLOBIN A1C
Hgb A1c MFr Bld: 5.1 % (ref 4.8–5.6)
Mean Plasma Glucose: 99.67 mg/dL

## 2019-10-11 LAB — ECHOCARDIOGRAM COMPLETE
Height: 66 in
Weight: 2832 oz

## 2019-10-11 IMAGING — CT CT BIOPSY AND ASPIRATION BONE MARROW
1 of 2 series · 15 of 32 positions shown, 19 images · non-contrast
Comparison: none

INDICATION: 72-year-old with anemia workup.

[Series 2: i-spiral 5.0 b40f · axial · 0.91mm/px · z∈[+872,+1103]mm · 15 of 72 slices shown, 19 images]
[im 3/72  soft-tissue]
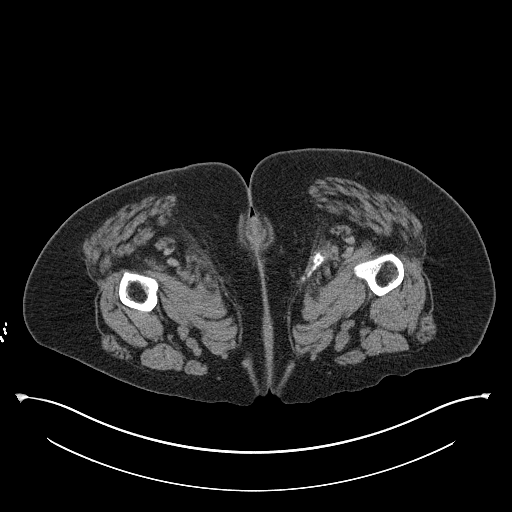
[im 3/72  bone]
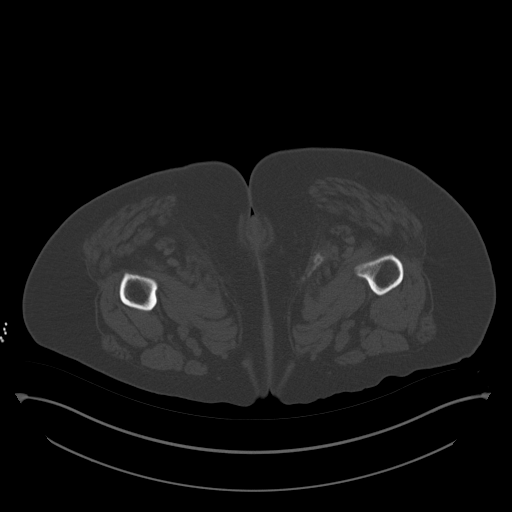
[im 9/72  soft-tissue]
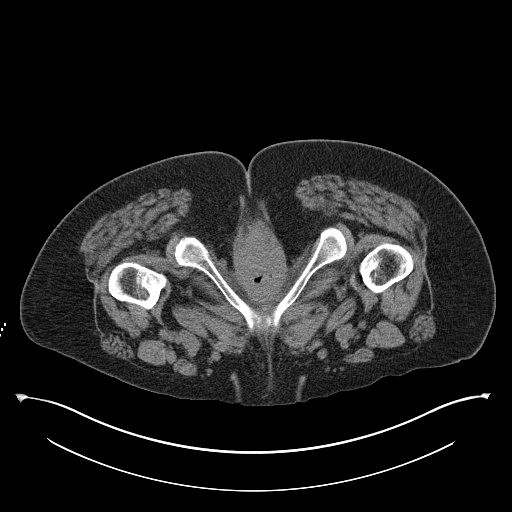
[im 14/72  soft-tissue]
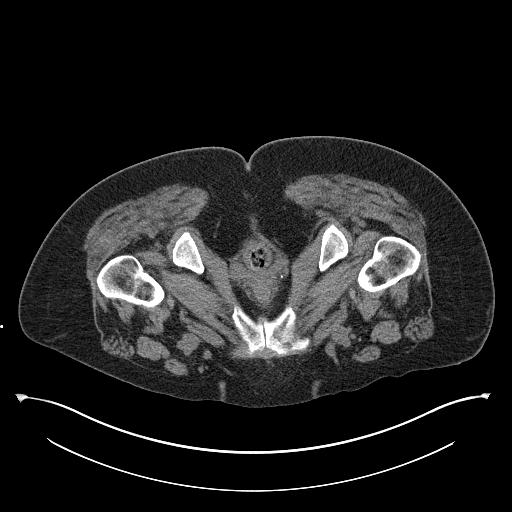
[im 20/72  soft-tissue]
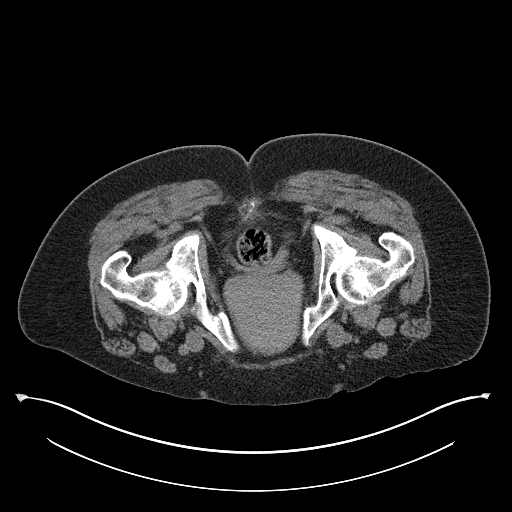
[im 25/72  soft-tissue]
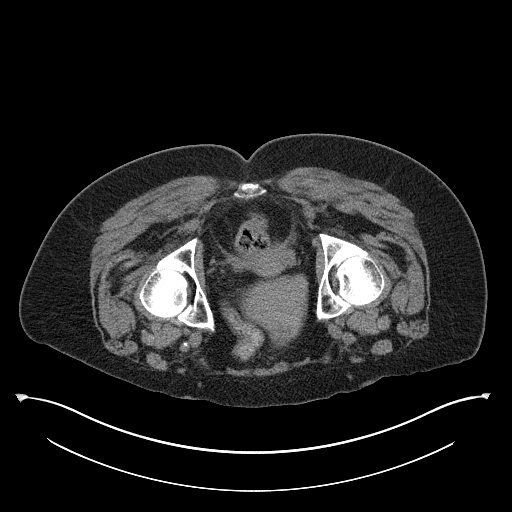
[im 31/72  soft-tissue]
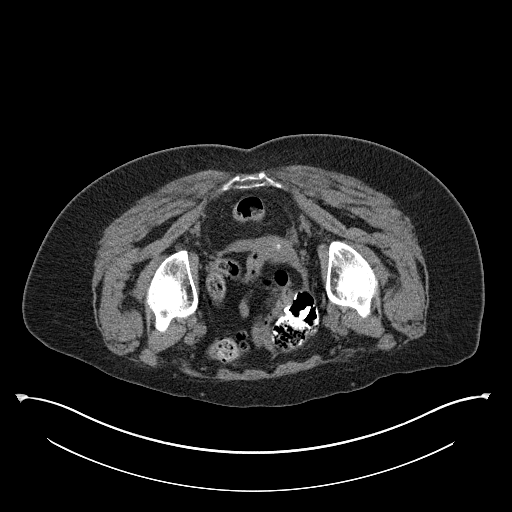
[im 36/72  soft-tissue]
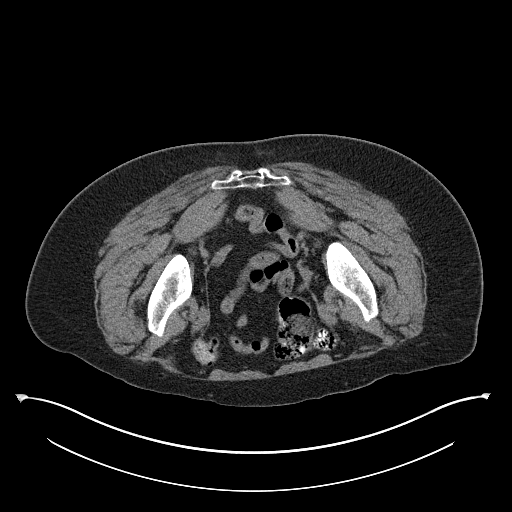
[im 41/72  soft-tissue]
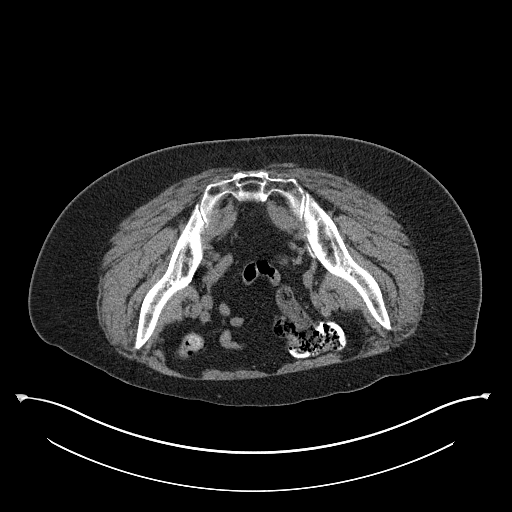
[im 47/72  soft-tissue]
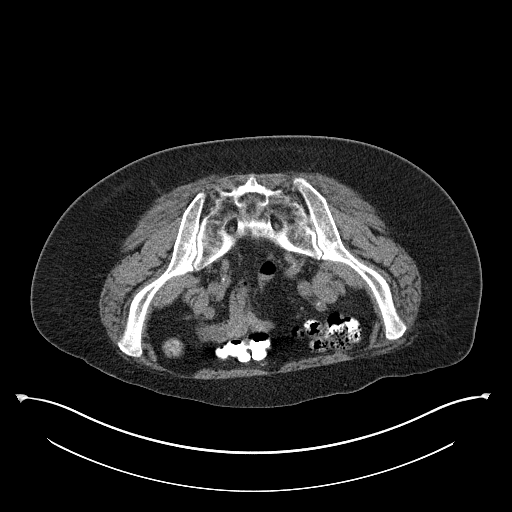
[im 47/72  bone]
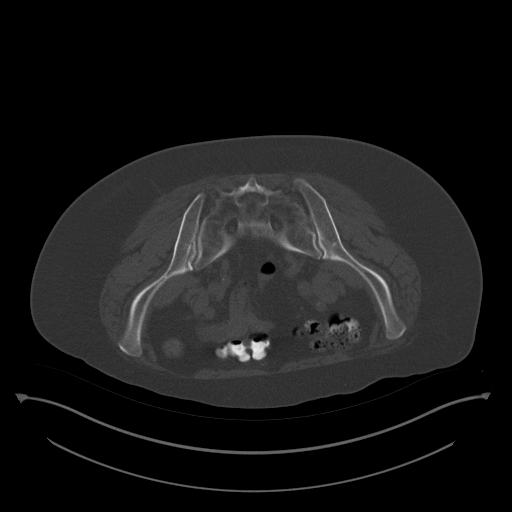
[im 52/72  soft-tissue]
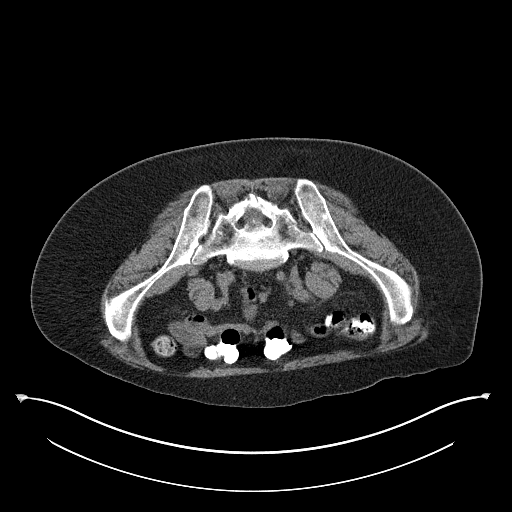
[im 58/72  soft-tissue]
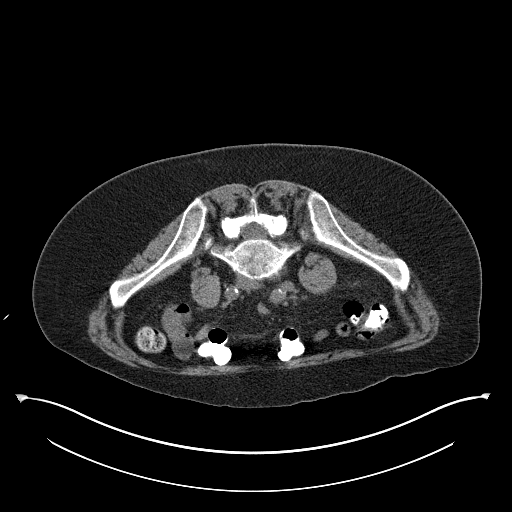
[im 61/72  lung]
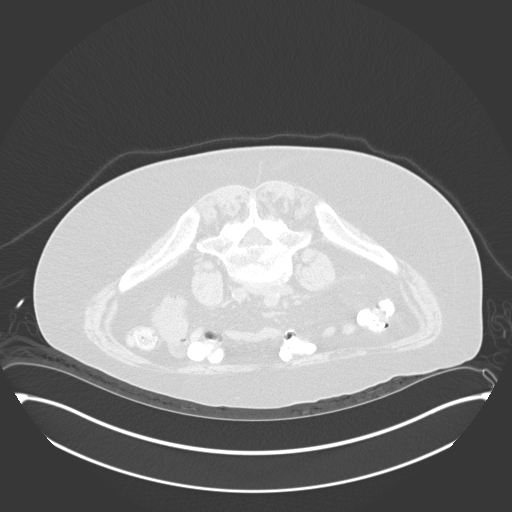
[im 63/72  soft-tissue]
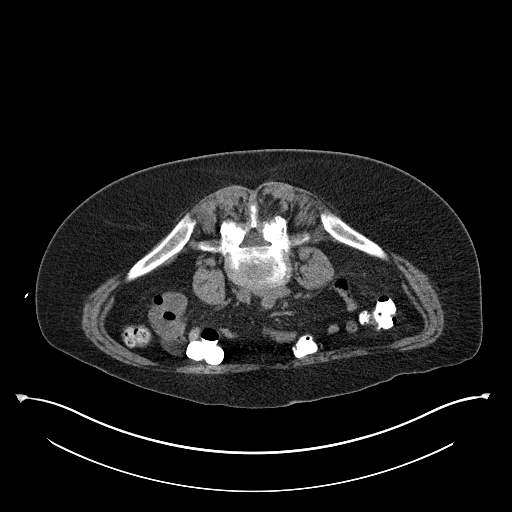
[im 63/72  lung]
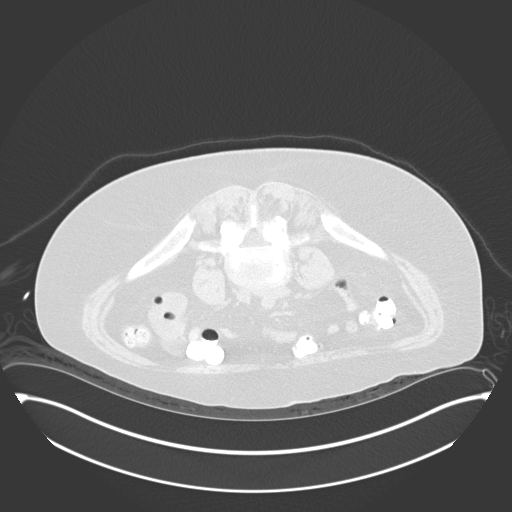
[im 66/72  lung]
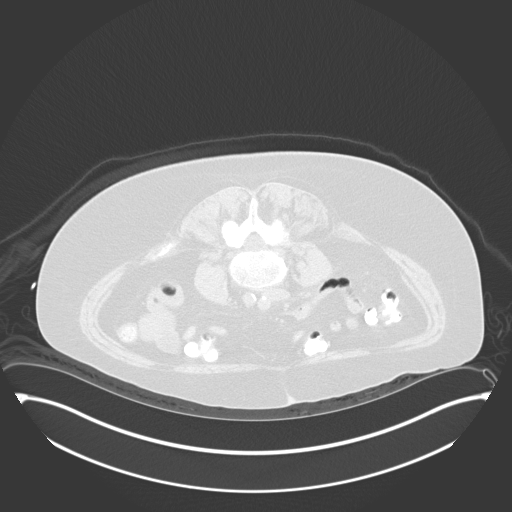
[im 69/72  soft-tissue]
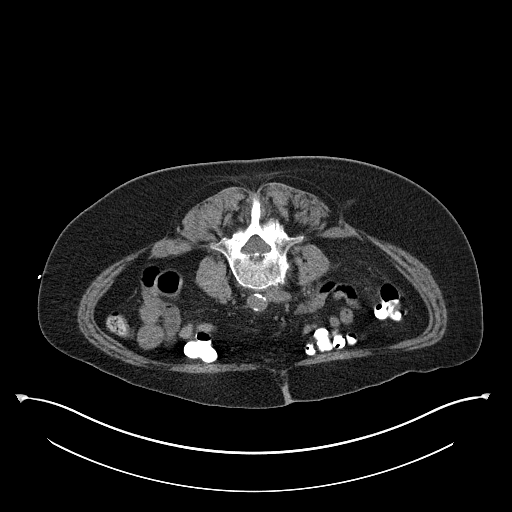
[im 69/72  lung]
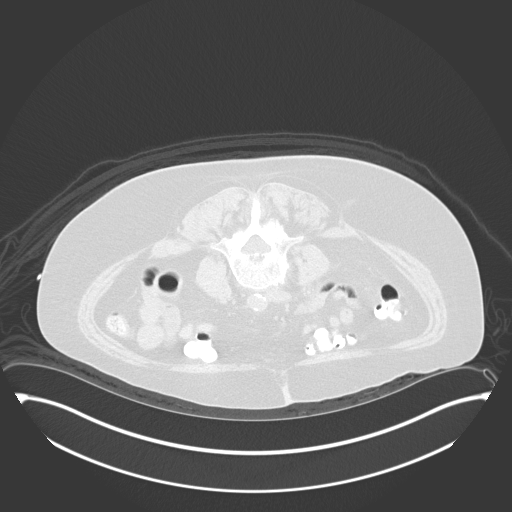

[15 of 32 positions shown; findings below may reference images not displayed]

EXAM:
CT GUIDED BONE MARROW ASPIRATES AND BIOPSY

MEDICATIONS:
None.

ANESTHESIA/SEDATION:
Fentanyl 50 mcg IV; Versed 1.5 mg IV

Moderate Sedation Time:  21 minutes

The patient was continuously monitored during the procedure by the
interventional radiology nurse under my direct supervision.

COMPLICATIONS:
None immediate.

PROCEDURE:
The procedure was explained to the patient. The risks and benefits
of the procedure were discussed and the patient's questions were
addressed. Informed consent was obtained from the patient. The
patient was placed prone on CT table. Images of the pelvis were
obtained. The left side of back was prepped and draped in sterile
fashion. The skin and left posterior ilium were anesthetized with 1%
lidocaine. 11 gauge bone needle was directed into the left ilium
with CT guidance. Two aspirates and two core biopsies were obtained.
Bandage placed over the puncture site.
FINDINGS: Bone needle directed into the posterior left ilium.
IMPRESSION: CT guided bone marrow aspiration and core biopsy.

## 2019-10-11 MED ORDER — DIPHENHYDRAMINE HCL 25 MG PO CAPS
25.0000 mg | ORAL_CAPSULE | Freq: Once | ORAL | Status: DC | PRN
  Administered 2019-10-11: 25 mg via ORAL
  Filled 2019-10-11: qty 1

## 2019-10-11 MED ORDER — INSULIN ASPART 100 UNIT/ML ~~LOC~~ SOLN
0.0000 [IU] | Freq: Three times a day (TID) | SUBCUTANEOUS | Status: DC
  Administered 2019-10-11: 2 [IU] via SUBCUTANEOUS
  Administered 2019-10-12: 4 [IU] via SUBCUTANEOUS
  Administered 2019-10-12: 1 [IU] via SUBCUTANEOUS

## 2019-10-11 MED ORDER — IMMUNE GLOBULIN (HUMAN) 10 GM/100ML IV SOLN
1.0000 g/kg | Freq: Every day | INTRAVENOUS | Status: AC
  Administered 2019-10-11 – 2019-10-12 (×2): 80 g via INTRAVENOUS
  Filled 2019-10-11 (×2): qty 800

## 2019-10-11 MED ORDER — INSULIN ASPART 100 UNIT/ML ~~LOC~~ SOLN
0.0000 [IU] | Freq: Every day | SUBCUTANEOUS | Status: DC
  Administered 2019-10-11: 3 [IU] via SUBCUTANEOUS
  Administered 2019-10-12: 21:00:00 2 [IU] via SUBCUTANEOUS

## 2019-10-11 MED ORDER — METOPROLOL TARTRATE 25 MG PO TABS
25.0000 mg | ORAL_TABLET | Freq: Two times a day (BID) | ORAL | Status: DC
  Administered 2019-10-11: 21:00:00 25 mg via ORAL
  Filled 2019-10-11 (×2): qty 1

## 2019-10-11 MED ORDER — ACETAMINOPHEN 325 MG PO TABS
650.0000 mg | ORAL_TABLET | Freq: Once | ORAL | Status: DC | PRN
  Administered 2019-10-11: 650 mg via ORAL
  Filled 2019-10-11: qty 2

## 2019-10-11 MED ORDER — METOPROLOL TARTRATE 5 MG/5ML IV SOLN
2.5000 mg | Freq: Once | INTRAVENOUS | Status: AC
  Administered 2019-10-11: 2.5 mg via INTRAVENOUS
  Filled 2019-10-11: qty 5

## 2019-10-11 MED ORDER — ONDANSETRON HCL 4 MG/2ML IJ SOLN
4.0000 mg | Freq: Once | INTRAMUSCULAR | Status: DC | PRN
  Administered 2019-10-11: 4 mg via INTRAVENOUS
  Filled 2019-10-11: qty 2

## 2019-10-11 MED ORDER — METOPROLOL TARTRATE 5 MG/5ML IV SOLN: 2.5000 mg | Freq: Three times a day (TID) | INTRAVENOUS | Status: DC | PRN

## 2019-10-11 NOTE — Progress Notes (Signed)
Alexandra Pierce   HEMATOLOGY/ONCOLOGY INPATIENT PROGRESS NOTE  Date of Service: 10/11/2019  Inpatient Attending: .Alma Friendly, MD   SUBJECTIVE  Patient notes her shortness of breath is improved after PRBC transfusion and improvement in her hgb to 9.1. Tolerating high dose steroids. No issues with tolerating IVIG 1st dose when seen this afternoon. Still having DOE with ambulation. Seen by cardiology for afib ? Symptomatic. ECHO nl EF.    OBJECTIVE:  NAD. No SOB at rest.  PHYSICAL EXAMINATION: . Vitals:   10/11/19 0445 10/11/19 0449 10/11/19 0512 10/11/19 0747  BP: 125/83 125/83 115/85 108/73  Pulse: (!) 123 (!) 123 (!) 110 93  Resp: 18 18 20 15   Temp: 97.6 F (36.4 C) 97.6 F (36.4 C) 97.8 F (36.6 C) 97.9 F (36.6 C)  TempSrc: Oral Oral Oral Oral  SpO2: 97% 97% 98% 97%  Weight:      Height:       Filed Weights   10/10/19 1155  Weight: (S) 177 lb (80.3 kg)   .Body mass index is 28.57 kg/m.  GENERAL:alert, in no acute distress and comfortable SKIN: no acute rashes EYES: scleral icterus noted NECK: supple, no JVDy LYMPH:  no palpable lymphadenopathy in the cervical, axillary or inguinal LUNGS: clear to auscultation with normal respiratory effort HEART: irreg, tachy ABDOMEN: abdomen soft, non-tender, normoactive bowel sounds  Musculoskeletal: no pedal edema PSYCH: alert & oriented x 3 with fluent speech NEURO: no focal motor/sensory deficits  MEDICAL HISTORY:  Past Medical History:  Diagnosis Date  . Chronic lower back pain   . Hypercholesteremia   . Hypertension    mild  . Obesity   . Paroxysmal atrial fibrillation (HCC)    managed with anticoagulation and rate control.   . Stroke [facility]) 2001 & 2015   "right side gets weak sometimes if I'm only tired" (03/16/2016)    SURGICAL HISTORY: Past Surgical History:  Procedure Laterality Date  . CARDIAC CATHETERIZATION  03/31/2000   EF 49%/LAD lg vessel which coursed to the apex & gave rise to 2 diagonal  branches/ LAD noted to have 30% ostial lesion & tapers to a sm vessel toward the apex but no high grade lesion/1st diagonal med sized vessel & 2nd diagonal sm vessel with no significant disease/ lt ventriculogram reveals mild global hypokinesis w/ an estimated EF of 45-50%  . CARDIAC CATHETERIZATION N/A 03/17/2016   Procedure: Left Heart Cath and Coronary Angiography;  Surgeon: Wellington Hampshire, MD;  Location: [place] [facility];  Service: Cardiovascular;  Laterality: N/A;  . LEFT HEART CATH AND CORONARY ANGIOGRAPHY N/A 03/31/2018   Procedure: LEFT HEART CATH AND CORONARY ANGIOGRAPHY;  Surgeon: Martinique, Peter M, MD;  Location: [place] [facility];  Service: Cardiovascular;  Laterality: N/A;    SOCIAL HISTORY: Social History   Socioeconomic History  . Marital status: Married    Spouse name: Not on file  . Number of children: 2  . Years of education: Not on file  . Highest education level: Not on file  Occupational History  . Occupation: Herbalist  Tobacco Use  . Smoking status: Former Smoker    Packs/day: 0.12    Years: 15.00    Pack years: 1.80    Types: Cigarettes    Quit date: 10/18/1978    Years since quitting: 41.0  . Smokeless tobacco: Never Used  Substance and Sexual Activity  . Alcohol use: Yes    Alcohol/week: 14.0 standard drinks    Types: 14 Glasses of wine per week  .  Drug use: No  . Sexual activity: Yes  Other Topics Concern  . Not on file  Social History Narrative  . Not on file   Social Determinants of Health   Financial Resource Strain:   . Difficulty of Paying Living Expenses: Not on file  Food Insecurity:   . Worried About Charity fundraiser in the Last Year: Not on file  . Ran Out of Food in the Last Year: Not on file  Transportation Needs:   . Lack of Transportation (Medical): Not on file  . Lack of Transportation (Non-Medical): Not on file  Physical Activity:   . Days of Exercise per Week: Not on file  . Minutes of Exercise per Session: Not on  file  Stress:   . Feeling of Stress : Not on file  Social Connections:   . Frequency of Communication with Friends and Family: Not on file  . Frequency of Social Gatherings with Friends and Family: Not on file  . Attends Religious Services: Not on file  . Active Member of Clubs or Organizations: Not on file  . Attends Archivist Meetings: Not on file  . Marital Status: Not on file  Intimate Partner Violence:   . Fear of Current or Ex-Partner: Not on file  . Emotionally Abused: Not on file  . Physically Abused: Not on file  . Sexually Abused: Not on file    FAMILY HISTORY: Family History  Problem Relation Age of Onset  . Heart disease Father   . Asthma Father   . Diabetes Father   . Heart disease Mother   . Heart disease Brother   . Hyperlipidemia Sister   . Hyperlipidemia Brother   . Hyperlipidemia Brother   . Cancer Neg Hx     ALLERGIES:  is allergic to sulfonamide derivatives; lipitor [atorvastatin]; pravachol [pravastatin sodium]; and statins.  MEDICATIONS:  Scheduled Meds: . B-complex with vitamin C  1 tablet Oral Daily  . benzonatate  200 mg Oral BID  . fenofibrate  160 mg Oral Daily  . fluticasone  1 spray Each Nare Daily  . folic acid  5 mg Oral Daily  . guaiFENesin  600 mg Oral BID  . metoprolol tartrate  12.5 mg Oral BID  . pantoprazole  40 mg Oral Q1200  . predniSONE  80 mg Oral Q breakfast  . rivaroxaban  20 mg Oral Daily  . cyanocobalamin  1,000 mcg Oral Daily   Continuous Infusions: . IMMUNE GLOBULIN 10% (HUMAN) IV - For Fluid Restriction Only     PRN Meds:.acetaminophen **OR** acetaminophen, acetaminophen, diphenhydrAMINE, ondansetron **OR** ondansetron (ZOFRAN) IV, ondansetron (ZOFRAN) IV  REVIEW OF SYSTEMS:    10 Point review of Systems was done is negative except as noted above.   LABORATORY DATA:  I have reviewed the data as listed  . CBC Latest Ref Rng & Units 10/11/2019 10/10/2019 10/04/2019  WBC 4.0 - 10.5 K/uL 6.0 5.1 5.4   Hemoglobin 12.0 - 15.0 g/dL 9.1(L) 6.5(LL) 6.7(LL)  Hematocrit 36.0 - 46.0 % 24.5(L) 19.7(L) 19.9(L)  Platelets 150 - 400 K/uL 321 397 346    . CMP Latest Ref Rng & Units 10/11/2019 10/10/2019 10/10/2019  Glucose 70 - 99 mg/dL 188(H) 214(H) 128(H)  BUN 8 - 23 mg/dL 29(H) 27(H) 25(H)  Creatinine 0.44 - 1.00 mg/dL 0.86 1.09(H) 1.06(H)  Sodium 135 - 145 mmol/L 138 138 140  Potassium 3.5 - 5.1 mmol/L 4.4 3.9 3.6  Chloride 98 - 111 mmol/L 107 104 107  CO2  22 - 32 mmol/L 22 21(L) 22  Calcium 8.9 - 10.3 mg/dL 9.1 9.1 8.8(L)  Total Protein 6.5 - 8.1 g/dL 6.2(L) 6.6 6.5  Total Bilirubin 0.3 - 1.2 mg/dL 4.5(H) 4.7(H) 5.9(H)  Alkaline Phos 38 - 126 U/L 42 43 40  AST 15 - 41 U/L 18 29 32  ALT 0 - 44 U/L 21 26 24    Component     Latest Ref Rng & Units 10/03/2019 10/10/2019 10/11/2019  Adenovirus     NOT DETECTED   NOT DETECTED  Coronavirus 229E     NOT DETECTED   NOT DETECTED  Coronavirus HKU1     NOT DETECTED   NOT DETECTED  Coronavirus NL63     NOT DETECTED   NOT DETECTED  Coronavirus OC43     NOT DETECTED   NOT DETECTED  Metapneumovirus     NOT DETECTED   NOT DETECTED  Rhinovirus / Enterovirus     NOT DETECTED   NOT DETECTED  Influenza A     NOT DETECTED   NOT DETECTED  Influenza B     NOT DETECTED   NOT DETECTED  Parainfluenza Virus 1     NOT DETECTED   NOT DETECTED  Parainfluenza Virus 2     NOT DETECTED   NOT DETECTED  Parainfluenza Virus 3     NOT DETECTED   NOT DETECTED  Parainfluenza Virus 4     NOT DETECTED   NOT DETECTED  Respiratory Syncytial Virus     NOT DETECTED   NOT DETECTED  Bordetella pertussis     NOT DETECTED   NOT DETECTED  Chlamydophila pneumoniae     NOT DETECTED   NOT DETECTED  Mycoplasma pneumoniae     NOT DETECTED   NOT DETECTED  Influenza A By PCR     NEGATIVE NEGATIVE    Influenza B By PCR     NEGATIVE NEGATIVE    SARS Coronavirus 2     NEGATIVE NEGATIVE NEGATIVE   Troponin I (High Sensitivity)     <18 ng/L  7   B Natriuretic Peptide      0.0 - 100.0 pg/mL  130.7 (H)   Vitamin B12     180 - 914 pg/mL  1,446 (H)       RADIOGRAPHIC STUDIES: I have personally reviewed the radiological images as listed and agreed with the findings in the report. DG Chest Port 1 View  Result Date: 10/10/2019 CLINICAL DATA:  Shortness of breath for 1 week, atrial fibrillation, persistent dry cough, COVID-19 negative on 10/03/2019 EXAM: PORTABLE CHEST 1 VIEW COMPARISON:  Portable exam 1200 hours compared to 10/03/2019 FINDINGS: Normal heart size, mediastinal contours, and pulmonary vascularity. Lungs clear. No pleural effusion or pneumothorax. Bones appear demineralized. IMPRESSION: No acute abnormalities. Electronically Signed   By: Lavonia Dana M.D.   On: 10/10/2019 12:20   DG Chest Portable 1 View  Result Date: 10/03/2019 CLINICAL DATA:  Cough, shortness of breath EXAM: PORTABLE CHEST 1 VIEW COMPARISON:  04/11/2018 FINDINGS: Heart and mediastinal contours are within normal limits. No focal opacities or effusions. No acute bony abnormality. IMPRESSION: No active disease. Electronically Signed   By: Rolm Baptise M.D.   On: 10/03/2019 17:44    ASSESSMENT & PLAN:   73 y.o.female with  1.  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  Nonproductive cough Unclear etiology.  She has a normal chest x-ray.  COVID-19 testing , recent influenza panel and viral respiratory panel  neg 4. Atrial fibrillation on Xarelto -- with RVR  -- could be contributing to SOB in addition to symptomatic anemia PLAN: -labs discussed with labs -hgb improved to 9.1 -tolerating 1st dose out of 2 of IVIG today -tolerating Prednisone @ 80mg  po daily -- would reduce by 10mg /d every week till down to 40mg  po daily. -will need to be on PCP/PJP prophylaxis - givne sulfa allergy --would likely need to consider starting on Atovaquone. -transfuse most compatible available PRBC as needed for HGB<7.5 or if symptomatic -mx of afib with RVR per  hospitalist/cardiology -high dose folic acid and B complex to support accelerated hemolysis - discussed that typical option for using immunosupressants for steroid/rituxan resistant warm AIHA are complicated to use in her case since they can cause additional supression of her BM in setting of already having MDS. -would re-use Rituxan weekly (early next year) vs consider Vincristine vs low dose cellcept. -will need to set for labs and transfusion support in 1 week upon discharge.  I spent 25 minutes counseling the patient face to face. The total time spent in the appointment was 35 minutes and more than 50% was on counseling and direct patient cares.    Sullivan Lone MD [place] AAHIVMS [facility] Hematology/Oncology Physician [facility]  (Office):       [phone] (Work cell):  [phone] (Fax):           [phone]  10/11/2019 10:01 AM

## 2019-10-11 NOTE — Progress Notes (Signed)
PROGRESS NOTE  Alexandra Pierce [id number] DOB: Jan 15, 1946 DOA: 10/10/2019 PCP: Chesley Noon, MD  HPI/Recap of past 24 hours: HPI from Dr Lala Lund is a 74 y.o. female with medical history significant of paroxysmal atrial fibrillation on Xarelto, MDS/refractory anemia with ringed sideroblasts and warm antibody hemolytic anemia, ongoing anemia requiring intermittent blood transfusions presented to the ER with shortness of breath for 1 week and persistent dry cough.  Patient reports that she requires intermittent transfusions for her anemia and last transfusion approximately 2 weeks ago, she used to be on prednisone for hemolytic anemia, her oncologist weaned her off prednisone about 2 weeks ago. Presented to her PCP, noted to be tachypneic, was advised to go to the ED. In the ED, hemoglobin 6.5, chest x-ray clear, COVID-19 PCR negative.  Patient admitted for further management    Today, patient's reports feeling okay, still feels tired, with generalized weakness, still with nonproductive cough.  Daughter at bedside, questions answered.    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 Irene Limbo on board, recommend statin 80 mg p.o. daily with slow taper over 2 to 3 months, high-dose folic acid and B complex, give IVIG for 2 days, monitor hemoglobin for stability at least for 1 to 2 days prior to discharge Continue prednisone Continue IVIG Daily CBC  Nonproductive cough Unclear etiology COVID-19 PCR and influenza negative Continue PPI, ??GERD Continue  cough suppressants  Paroxysmal A. Fib Noted to be in A. fib with RVR earlier this a.m. Echo with EF of 60 to 65%, left ventricular diastolic function could not be evaluated, no regional wall motion abnormalities IV metoprolol as needed Increase home metoprolol to 25 mg twice daily Continue Xarelto  Hyperglycemia likely due to steroids SSI, Accu-Cheks  History of TIA/CVA Continue Xarelto        Malnutrition Type:      Malnutrition Characteristics:      Nutrition Interventions:       Estimated body mass index is 28.57 kg/m as calculated from the following:   Height as of this encounter: 5\' 6"  (1.676 m).   Weight as of this encounter: 80.3 kg.     Code Status: DNR  Family Communication: Daughter at bedside  Disposition Plan: To be determined   Consultants:  Oncology  Procedures:  None  Antimicrobials:  None  DVT prophylaxis: Xarelto   Objective: Vitals:   10/11/19 1308 10/11/19 1330 10/11/19 1353 10/11/19 1447  BP: 107/77 102/73 106/72 118/83  Pulse: 95 92 85 (!) 102  Resp: 18 14 18 16   Temp: 98.2 F (36.8 C) 98.4 F (36.9 C) 98.3 F (36.8 C) 98.2 F (36.8 C)  TempSrc: Oral Oral Oral Oral  SpO2: 97% 95% 97% 98%  Weight:      Height:        Intake/Output Summary (Last 24 hours) at 10/11/2019 1516 Last data filed at 10/11/2019 1400 Gross per 24 hour  Intake 1382.52 ml  Output --  Net 1382.52 ml   Filed Weights   10/10/19 1155  Weight: (S) 80.3 kg    Exam:  General: NAD, noted pallor  Cardiovascular: S1, S2 present  Respiratory: CTAB  Abdomen: Soft, nontender, nondistended, bowel sounds present  Musculoskeletal: No bilateral pedal edema noted  Skin: Normal  Psychiatry: Normal mood   Data Reviewed: CBC: Recent Labs  Lab 10/10/19 1154 10/11/19 1027  WBC 5.1 6.0  NEUTROABS 3.8 4.3  HGB 6.5* 9.1*  HCT 19.7* 24.5*  MCV 96.6 97.6  PLT 397 [id number]   Basic Metabolic Panel: Recent Labs  Lab 10/10/19 1154  10/10/19 2040 10/11/19 0430  NA 140 138 138  K 3.6 3.9 4.4  CL 107 104 107  CO2 22 21* 22  GLUCOSE 128* 214* 188*  BUN 25* 27* 29*  CREATININE 1.06* 1.09* 0.86  CALCIUM 8.8* 9.1 9.1   GFR: Estimated Creatinine Clearance: 61.3 mL/min (by C-G formula based on SCr of 0.86 mg/dL). Liver Function Tests: Recent Labs  Lab 10/10/19 1154 10/10/19 2040 10/11/19 0430  AST 32 29 18  ALT 24 26 21   ALKPHOS 40 43 42  BILITOT 5.9* 4.7* 4.5*  PROT 6.5 6.6 6.2*  ALBUMIN 3.9 3.9 3.8   No results for input(s): LIPASE, AMYLASE in the last 168 hours. No results for input(s): AMMONIA in the last 168 hours. Coagulation Profile: Recent Labs  Lab 10/10/19 1154  INR 2.5*   Cardiac Enzymes: No results for input(s): CKTOTAL, CKMB, CKMBINDEX, TROPONINI in the last 168 hours. BNP (last 3 results) No results for input(s): PROBNP in the last 8760 hours. HbA1C: No results for input(s): HGBA1C in the last 72 hours. CBG: No results for input(s): GLUCAP in the last 168 hours. Lipid Profile: No results for input(s): CHOL, HDL, LDLCALC, TRIG, CHOLHDL, LDLDIRECT in the last 72 hours. Thyroid Function Tests: No results for input(s): TSH, T4TOTAL, FREET4, T3FREE, THYROIDAB in the last 72 hours. Anemia Panel: Recent Labs    10/10/19 1342 10/10/19 2040  VITAMINB12 1,446*  --   RETICCTPCT  --  6.0*   Urine analysis:    Component Value Date/Time   COLORURINE YELLOW 05/23/2018 1941   APPEARANCEUR CLEAR 05/23/2018 1941   LABSPEC 1.028 05/23/2018 1941   PHURINE 7.0 05/23/2018 1941   GLUCOSEU NEGATIVE 05/23/2018 1941   HGBUR SMALL (A) 05/23/2018 1941   BILIRUBINUR NEGATIVE 05/23/2018 1941   KETONESUR NEGATIVE 05/23/2018 1941   PROTEINUR NEGATIVE 05/23/2018 1941   UROBILINOGEN 0.2 01/11/2014 1200   NITRITE NEGATIVE 05/23/2018 1941   LEUKOCYTESUR TRACE (A) 05/23/2018 1941   Sepsis Labs: @LABRCNTIP (procalcitonin:4,lacticidven:4)  ) Recent Results (from the past 240 hour(s))  SARS CORONAVIRUS 2  (TAT 6-24 HRS) Nasopharyngeal Nasopharyngeal Swab     Status: None   Collection Time: 10/03/19  6:54 PM   Specimen: Nasopharyngeal Swab  Result Value Ref Range Status   SARS Coronavirus 2 NEGATIVE NEGATIVE Final    Comment: (NOTE) SARS-CoV-2 target nucleic acids are NOT DETECTED. The SARS-CoV-2 RNA is generally detectable in upper and lower respiratory specimens during the acute phase of infection. Negative results do not preclude SARS-CoV-2 infection, do not rule out co-infections with other pathogens, and should not be used as the sole basis for treatment or other patient management decisions. Negative results must be combined with clinical observations, patient history, and epidemiological information. The expected result is Negative. Fact Sheet for Patients: SugarRoll.be Fact Sheet for Healthcare Providers: https://www.woods-mathews.com/ This test is not yet approved or cleared by the [place] FDA and  has been authorized for detection and/or diagnosis of SARS-CoV-2 by FDA under an Emergency Use Authorization (EUA). This EUA will remain  in effect (meaning this test  can be used) for the duration of the COVID-19 declaration under Section 56 4(b)(1) of the Act, 21 U.S.C. section 360bbb-3(b)(1), unless the authorization is terminated or revoked sooner. Performed at [facility], [place] [address], [place], [place] [address]   SARS CORONAVIRUS 2 (TAT 6-24 HRS) Nasopharyngeal Nasopharyngeal Swab     Status: None   Collection Time: 10/10/19  1:42 PM   Specimen: Nasopharyngeal Swab  Result Value Ref Range Status   SARS Coronavirus 2 NEGATIVE NEGATIVE Final    Comment: (NOTE) SARS-CoV-2 target nucleic acids are NOT DETECTED. The SARS-CoV-2 RNA is generally detectable in upper and lower respiratory specimens during the acute phase of infection. Negative results do not preclude SARS-CoV-2 infection, do not rule out co-infections with  other pathogens, and should not be used as the sole basis for treatment or other patient management decisions. Negative results must be combined with clinical observations, patient history, and epidemiological information. The expected result is Negative. Fact Sheet for Patients: SugarRoll.be Fact Sheet for Healthcare Providers: https://www.woods-mathews.com/ This test is not yet approved or cleared by the [place] FDA and  has been authorized for detection and/or diagnosis of SARS-CoV-2 by FDA under an Emergency Use Authorization (EUA). This EUA will remain  in effect (meaning this test can be used) for the duration of the COVID-19 declaration under Section 56 4(b)(1) of the Act, 21 U.S.C. section 360bbb-3(b)(1), unless the authorization is terminated or revoked sooner. Performed at [facility], [place] [address] [place], [place] [address]   Respiratory Panel by PCR     Status: None   Collection Time: 10/11/19  5:10 AM   Specimen: Nasopharyngeal Swab; Respiratory  Result Value Ref Range Status   Adenovirus NOT DETECTED NOT DETECTED Final   Coronavirus 229E NOT DETECTED NOT DETECTED Final    Comment: (NOTE) The Coronavirus on the Respiratory Panel, DOES NOT test for the novel  Coronavirus (2019 nCoV)    Coronavirus HKU1 NOT DETECTED NOT DETECTED Final   Coronavirus NL63 NOT DETECTED NOT DETECTED Final   Coronavirus OC43 NOT DETECTED NOT DETECTED Final   Metapneumovirus NOT DETECTED NOT DETECTED Final   Rhinovirus / Enterovirus NOT DETECTED NOT DETECTED Final   Influenza A NOT DETECTED NOT DETECTED Final   Influenza B NOT DETECTED NOT DETECTED Final   Parainfluenza Virus 1 NOT DETECTED NOT DETECTED Final   Parainfluenza Virus 2 NOT DETECTED NOT DETECTED Final   Parainfluenza Virus 3 NOT DETECTED NOT DETECTED Final   Parainfluenza Virus 4 NOT DETECTED NOT DETECTED Final   Respiratory Syncytial Virus NOT DETECTED NOT DETECTED  Final   Bordetella pertussis NOT DETECTED NOT DETECTED Final   Chlamydophila pneumoniae NOT DETECTED NOT DETECTED Final   Mycoplasma pneumoniae NOT DETECTED NOT DETECTED Final    Comment: Performed at [facility], [place]. [address] [place], [place] [address]      Studies: ECHOCARDIOGRAM COMPLETE  Result Date: 10/11/2019   ECHOCARDIOGRAM REPORT   Patient Name:   Alexandra Pierce Date of Exam: 10/11/2019 Medical Rec #:  [id number]    Height:       66.0 in Accession #:    [id number]   Weight:       177.0 lb Date of Birth:  11/07/1945     BSA:          1.90 m Patient Age:    70 years     BP:           108/73 mmHg Patient Gender:  F            HR:           111 bpm. Exam Location:  Inpatient Procedure: 2D Echo Indications:    dyspnea 786.09  History:        Patient has prior history of Echocardiogram examinations, most                 recent 03/17/2016. Stroke; Risk Factors:Dyslipidemia and                 Hypertension. Paroxysmal a-fib.  Sonographer:    Jannett Celestine RDCS (AE) Referring Phys: [id number] [place]  1. Left ventricular ejection fraction, by visual estimation, is 60 to 65%. The left ventricle has normal function. There is no left ventricular hypertrophy.  2. Left ventricular diastolic function could not be evaluated.  3. The left ventricle has no regional wall motion abnormalities.  4. Global right ventricle has normal systolic function.The right ventricular size is normal. No increase in right ventricular wall thickness.  5. Left atrial size was severely dilated.  6. Right atrial size was normal.  7. Trivial pericardial effusion is present.  8. The mitral valve is normal in structure. Trivial mitral valve regurgitation. No evidence of mitral stenosis.  9. The tricuspid valve is normal in structure. 10. The aortic valve is normal in structure. Aortic valve regurgitation is not visualized. Mild aortic valve sclerosis without stenosis. 11. The pulmonic valve was grossly normal.  Pulmonic valve regurgitation is not visualized. 12. TR signal is inadequate for assessing pulmonary artery systolic pressure. 13. The inferior vena cava is dilated in size with <50% respiratory variability, suggesting right atrial pressure of 15 mmHg. FINDINGS  Left Ventricle: Left ventricular ejection fraction, by visual estimation, is 60 to 65%. The left ventricle has normal function. The left ventricle has no regional wall motion abnormalities. The left ventricular internal cavity size was the left ventricle is normal in size. There is no left ventricular hypertrophy. The left ventricular diastology could not be evaluated due to atrial fibrillation. Left ventricular diastolic function could not be evaluated. Right Ventricle: The right ventricular size is normal. No increase in right ventricular wall thickness. Global RV systolic function is has normal systolic function. Left Atrium: Left atrial size was severely dilated. Right Atrium: Right atrial size was normal in size Pericardium: Trivial pericardial effusion is present. Mitral Valve: The mitral valve is normal in structure. There is mild thickening of the mitral valve leaflet(s). Trivial mitral valve regurgitation. No evidence of mitral valve stenosis by observation. Tricuspid Valve: The tricuspid valve is normal in structure. Tricuspid valve regurgitation is trivial. Aortic Valve: The aortic valve is normal in structure. Aortic valve regurgitation is not visualized. Mild aortic valve sclerosis is present, with no evidence of aortic valve stenosis. Pulmonic Valve: The pulmonic valve was grossly normal. Pulmonic valve regurgitation is not visualized. Pulmonic regurgitation is not visualized. Aorta: The aortic root, ascending aorta and aortic arch are all structurally normal, with no evidence of dilitation or obstruction. Venous: The inferior vena cava is dilated in size with less than 50% respiratory variability, suggesting right atrial pressure of 15 mmHg. A  roughly spherical 5 cm hypoechoic lesion is seen in the left lobe of the liver, possibly a cyst. IAS/Shunts: No atrial level shunt detected by color flow Doppler. There is no evidence of a patent foramen ovale. No ventricular septal defect is seen or detected. There is no evidence of an atrial septal defect.  LEFT VENTRICLE PLAX 2D LVIDd:         4.60 cm LVIDs:         2.90 cm LV PW:         1.20 cm LV IVS:        1.00 cm LVOT diam:     1.90 cm LV SV:         65 ml LV SV Index:   33.32 LVOT Area:     2.84 cm  RIGHT VENTRICLE RV S prime:     16.00 cm/s TAPSE (M-mode): 1.3 cm LEFT ATRIUM             Index LA diam:        4.50 cm 2.37 cm/m LA Vol (A2C):   70.6 ml 37.18 ml/m LA Vol (A4C):   78.9 ml 41.56 ml/m LA Biplane Vol: 75.3 ml 39.66 ml/m  AORTIC VALVE LVOT Vmax:   91.40 cm/s LVOT Vmean:  59.600 cm/s LVOT VTI:    0.175 m  AORTA Ao Root diam: 3.00 cm MITRAL VALVE MV Area (PHT): 4.06 cm            SHUNTS MV PHT:        54.23 msec          Systemic VTI:  0.18 m MV Decel Time: 187 msec            Systemic Diam: 1.90 cm MV E velocity: 98.50 cm/s 103 cm/s  Dani Gobble Croitoru MD Electronically signed by Sanda Klein MD Signature Date/Time: 10/11/2019/11:34:43 AM    Final     Scheduled Meds: . B-complex with vitamin C  1 tablet Oral Daily  . benzonatate  200 mg Oral BID  . fenofibrate  160 mg Oral Daily  . fluticasone  1 spray Each Nare Daily  . folic acid  5 mg Oral Daily  . guaiFENesin  600 mg Oral BID  . metoprolol tartrate  12.5 mg Oral BID  . pantoprazole  40 mg Oral Q1200  . predniSONE  80 mg Oral Q breakfast  . rivaroxaban  20 mg Oral Daily  . cyanocobalamin  1,000 mcg Oral Daily    Continuous Infusions: . IMMUNE GLOBULIN 10% (HUMAN) IV - For Fluid Restriction Only 80 g (10/11/19 1225)     LOS: 0 days     Alma Friendly, MD [facility]  If 7PM-7AM, please contact night-coverage [email or web address] 10/11/2019, 3:16 PM

## 2019-10-11 NOTE — Plan of Care (Signed)

## 2019-10-11 NOTE — Progress Notes (Signed)
PT Cancellation Note  Patient Details Name: Alexandra Pierce MRN: [id number] DOB: January 13, 1946   Cancelled Treatment:    Reason Eval/Treat Not Completed: Other (comment) Spoke with RN and pt getting IVIG and requested to hold PT right now as pt had some changes in heart rate.  Request PT to return in morning.  PT agrees and will follow up as able. Maggie Font, PT Acute Rehab Services Pager [phone] [facility] [phone]    Karlton Lemon 10/11/2019, 3:07 PM

## 2019-10-11 NOTE — Progress Notes (Signed)
  Echocardiogram 2D Echocardiogram has been performed.  Jannett Celestine 10/11/2019, 9:34 AM

## 2019-10-12 DIAGNOSIS — I10 Essential (primary) hypertension: Secondary | ICD-10-CM

## 2019-10-12 LAB — TYPE AND SCREEN
ABO/RH(D): A POS
Antibody Screen: POSITIVE
Donor AG Type: NEGATIVE
Donor AG Type: NEGATIVE
Unit division: 0
Unit division: 0

## 2019-10-12 LAB — COMPREHENSIVE METABOLIC PANEL
ALT: 16 U/L (ref 0–44)
AST: 9 U/L — ABNORMAL LOW (ref 15–41)
Albumin: 3.2 g/dL — ABNORMAL LOW (ref 3.5–5.0)
Alkaline Phosphatase: 36 U/L — ABNORMAL LOW (ref 38–126)
Anion gap: 7 (ref 5–15)
BUN: 27 mg/dL — ABNORMAL HIGH (ref 8–23)
CO2: 22 mmol/L (ref 22–32)
Calcium: 9 mg/dL (ref 8.9–10.3)
Chloride: 108 mmol/L (ref 98–111)
Creatinine, Ser: 0.85 mg/dL (ref 0.44–1.00)
GFR calc Af Amer: >60 mL/min (ref >60)
GFR calc non Af Amer: >60 mL/min (ref >60)
Glucose, Bld: 166 mg/dL — ABNORMAL HIGH (ref 70–99)
Potassium: 4.5 mmol/L (ref 3.5–5.1)
Sodium: 137 mmol/L (ref 135–145)
Total Bilirubin: 1.8 mg/dL — ABNORMAL HIGH (ref 0.3–1.2)
Total Protein: 7.6 g/dL (ref 6.5–8.1)

## 2019-10-12 LAB — CBC WITH DIFFERENTIAL/PLATELET
Abs Immature Granulocytes: 0.16 10*3/uL — ABNORMAL HIGH (ref 0.00–0.07)
Basophils Absolute: 0 10*3/uL (ref 0.0–0.1)
Basophils Relative: 0 %
Eosinophils Absolute: 0 10*3/uL (ref 0.0–0.5)
Eosinophils Relative: 0 %
HCT: 25.4 % — ABNORMAL LOW (ref 36.0–46.0)
Hemoglobin: 8.2 g/dL — ABNORMAL LOW (ref 12.0–15.0)
Immature Granulocytes: 2 %
Lymphocytes Relative: 10 %
Lymphs Abs: 0.7 10*3/uL (ref 0.7–4.0)
MCH: 31.4 pg (ref 26.0–34.0)
MCHC: 32.3 g/dL (ref 30.0–36.0)
MCV: 97.3 fL (ref 80.0–100.0)
Monocytes Absolute: 0.2 10*3/uL (ref 0.1–1.0)
Monocytes Relative: 2 %
Neutro Abs: 6 10*3/uL (ref 1.7–7.7)
Neutrophils Relative %: 86 %
Platelets: 347 10*3/uL (ref 150–400)
RBC: 2.61 MIL/uL — ABNORMAL LOW (ref 3.87–5.11)
RDW: 18.7 % — ABNORMAL HIGH (ref 11.5–15.5)
WBC: 7.1 10*3/uL (ref 4.0–10.5)
nRBC: 2.4 % — ABNORMAL HIGH (ref 0.0–0.2)

## 2019-10-12 LAB — RETICULOCYTES
Immature Retic Fract: 44.4 % — ABNORMAL HIGH (ref 2.3–15.9)
RBC.: 1.95 MIL/uL — ABNORMAL LOW (ref 3.87–5.11)
Retic Count, Absolute: 104.5 10*3/uL (ref 19.0–186.0)
Retic Ct Pct: 5.4 % — ABNORMAL HIGH (ref 0.4–3.1)

## 2019-10-12 LAB — GLUCOSE, CAPILLARY
Glucose-Capillary: 129 mg/dL — ABNORMAL HIGH (ref 70–99)
Glucose-Capillary: 157 mg/dL — ABNORMAL HIGH (ref 70–99)
Glucose-Capillary: 347 mg/dL — ABNORMAL HIGH (ref 70–99)
Glucose-Capillary: 248 mg/dL — ABNORMAL HIGH (ref 70–99)

## 2019-10-12 LAB — BPAM RBC
Blood Product Expiration Date: 202101302359
Blood Product Expiration Date: 202101312359
ISSUE DATE / TIME: 202012232323
ISSUE DATE / TIME: 202012240435
Unit Type and Rh: 5100
Unit Type and Rh: 5100

## 2019-10-12 LAB — HAPTOGLOBIN: Haptoglobin: <10 mg/dL — ABNORMAL LOW (ref 42–346)

## 2019-10-12 LAB — LACTATE DEHYDROGENASE: LDH: 467 U/L — ABNORMAL HIGH (ref 98–192)

## 2019-10-12 MED ORDER — METOPROLOL TARTRATE 25 MG PO TABS
12.5000 mg | ORAL_TABLET | Freq: Once | ORAL | Status: AC
  Administered 2019-10-12: 12.5 mg via ORAL

## 2019-10-12 MED ORDER — METOPROLOL TARTRATE 25 MG PO TABS
12.5000 mg | ORAL_TABLET | Freq: Two times a day (BID) | ORAL | Status: DC
  Administered 2019-10-12 – 2019-10-13 (×2): 12.5 mg via ORAL
  Filled 2019-10-12 (×2): qty 1

## 2019-10-12 NOTE — Plan of Care (Signed)
  Problem: Education: Goal: Knowledge of General Education information will improve Description: Including pain rating scale, medication(s)/side effects and non-pharmacologic comfort measures Outcome: Progressing   Problem: Clinical Measurements: Goal: Will remain free from infection Outcome: Progressing Goal: Respiratory complications will improve Outcome: Progressing Goal: Cardiovascular complication will be avoided Outcome: Progressing   Problem: Activity: Goal: Risk for activity intolerance will decrease Outcome: Progressing   Problem: Pain Managment: Goal: General experience of comfort will improve Outcome: Progressing   Problem: Safety: Goal: Ability to remain free from injury will improve Outcome: Progressing   Problem: Skin Integrity: Goal: Risk for impaired skin integrity will decrease Outcome: Progressing

## 2019-10-12 NOTE — Evaluation (Signed)
Physical Therapy Evaluation Patient Details Name: Alexandra Pierce MRN: [id number] DOB: Feb 14, 1946 Today's Date: 10/12/2019   History of Present Illness  Pt admitted with symptomatic anemia and SOB.  PT wtih hx of PAF, ARF, CVA (15) and myelodysplastic syndrome  Clinical Impression  Pt admitted as above and presenting with functional mobility limitations 2* mild generalized weakness and balance deficits as well as limited endurance.  Pt ambulating this am on RA and maintaining SaO2 at 88% or higher but with HR elevating above 120.  Pt should progress to dc to previous living arrangement.  Pt states she has family in town over the weekend to assist.    Follow Up Recommendations No PT follow up    Equipment Recommendations  None recommended by PT    Recommendations for Other Services OT consult     Precautions / Restrictions Precautions Precautions: Fall Precaution Comments: monitor sats Restrictions Weight Bearing Restrictions: No      Mobility  Bed Mobility Overal bed mobility: Modified Independent                Transfers Overall transfer level: Modified independent               General transfer comment: unassisted  Ambulation/Gait Ambulation/Gait assistance: Min guard;Supervision Gait Distance (Feet): 100 Feet(twice) Assistive device: None Gait Pattern/deviations: Step-through pattern;Decreased step length - right;Decreased step length - left;Shuffle;Wide base of support     General Gait Details: mild general instability but no LOB; pt maintained O2 sats at 88% or higher on RA but HR elevated over 120 bpm  Stairs            Wheelchair Mobility    Modified Rankin (Stroke Patients Only)       Balance Overall balance assessment: Needs assistance Sitting-balance support: Feet supported;No upper extremity supported Sitting balance-Leahy Scale: Normal     Standing balance support: No upper extremity supported Standing balance-Leahy Scale: Good                                Pertinent Vitals/Pain Pain Assessment: No/denies pain    Home Living Family/patient expects to be discharged to:: Private residence Living Arrangements: Alone Available Help at Discharge: Family(daughters are in town over the weekend) Type of Home: House Home Access: Stairs to enter Entrance Stairs-Rails: Right Entrance Stairs-Number of Steps: 5 Home Layout: Laundry or work area in basement;Two level Home Equipment: Cane - single point      Prior Function Level of Independence: Independent               Hand Dominance        Extremity/Trunk Assessment   Upper Extremity Assessment Upper Extremity Assessment: Generalized weakness    Lower Extremity Assessment Lower Extremity Assessment: Generalized weakness    Cervical / Trunk Assessment Cervical / Trunk Assessment: Normal  Communication   Communication: No difficulties  Cognition Arousal/Alertness: Awake/alert Behavior During Therapy: WFL for tasks assessed/performed Overall Cognitive Status: Within Functional Limits for tasks assessed                                        General Comments      Exercises     Assessment/Plan    PT Assessment Patient needs continued PT services  PT Problem List Decreased strength;Decreased activity tolerance;Decreased mobility;Decreased balance;Decreased knowledge of use of DME  PT Treatment Interventions DME instruction;Gait training;Stair training;Functional mobility training;Therapeutic activities;Therapeutic exercise;Patient/family education;Balance training    PT Goals (Current goals can be found in the Care Plan section)  Acute Rehab PT Goals Patient Stated Goal: HOME PT Goal Formulation: With patient Time For Goal Achievement: 10/26/19 Potential to Achieve Goals: Good    Frequency Min 3X/week   Barriers to discharge        Co-evaluation               AM-PAC PT "6 Clicks" Mobility   Outcome Measure Help needed turning from your back to your side while in a flat bed without using bedrails?: None Help needed moving from lying on your back to sitting on the side of a flat bed without using bedrails?: None Help needed moving to and from a bed to a chair (including a wheelchair)?: A Little Help needed standing up from a chair using your arms (e.g., wheelchair or bedside chair)?: A Little Help needed to walk in hospital room?: A Little Help needed climbing 3-5 steps with a railing? : A Little 6 Click Score: 20    End of Session Equipment Utilized During Treatment: Gait belt Activity Tolerance: Patient tolerated treatment well Patient left: in chair;with call bell/phone within reach;with chair alarm set Nurse Communication: Mobility status PT Visit Diagnosis: Difficulty in walking, not elsewhere classified (R26.2);Muscle weakness (generalized) (M62.81)    Time: 6213-0865 PT Time Calculation (min) (ACUTE ONLY): 18 min   Charges:   PT Evaluation $PT Eval Low Complexity: 1 Low          Mauro Kaufmann PT Acute Rehabilitation Services Pager [phone] Office [phone]   Alexandra Pierce 10/12/2019, 1:50 PM

## 2019-10-12 NOTE — Progress Notes (Signed)
PROGRESS NOTE  Alexandra Pierce [id number] DOB: [birth year] DOA: 10/10/2019 PCP: Chesley Noon, MD  HPI/Recap of past 24 hours: HPI from Dr Lala Lund is a 73 y.o. female with medical history significant of paroxysmal atrial fibrillation on Xarelto, MDS/refractory anemia with ringed sideroblasts and warm antibody hemolytic anemia, ongoing anemia requiring intermittent blood transfusions presented to the ER with shortness of breath for 1 week and persistent dry cough.  Patient reports that she requires intermittent transfusions for her anemia and last transfusion approximately 2 weeks ago, she used to be on prednisone for hemolytic anemia, her oncologist weaned her off prednisone about 2 weeks ago. Presented to her PCP, noted to be tachypneic, was advised to go to the ED. In the ED, hemoglobin 6.5, chest x-ray clear, COVID-19 PCR negative.  Patient admitted for further management    Today, patient reports feeling much better, denies any new complaints, still coughing but denies it worsening.  Patient denies any chest pain, abdominal pain, nausea/vomiting, fever/chills, any signs of bleeding.  Patient very eager to be discharged today as she has family out of town for Christmas.    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 Irene Limbo on board, recommend statin 80 mg p.o. daily with slow taper over 2 to 3 months, high-dose folic acid and B complex, give IVIG for 2 days, monitor hemoglobin for stability at least for 1 to 2 days prior to discharge Continue  prednisone Completed 2 doses of IVIG Daily CBC  Nonproductive cough Unclear etiology COVID-19 PCR and influenza negative Continue PPI, ??GERD Continue cough suppressants  Paroxysmal A. Fib Noted to be in A. fib with RVR earlier this a.m. Echo with EF of 60 to 65%, left ventricular diastolic function could not be evaluated, no regional wall motion abnormalities IV metoprolol as needed Continue p.o. home metoprolol Continue Xarelto  Hyperglycemia likely due to steroids Hemoglobin A1c 5.1  Continue SSI, Accu-Cheks  History of TIA/CVA Continue Xarelto        Malnutrition Type:      Malnutrition Characteristics:      Nutrition Interventions:       Estimated body mass index is 28.57 kg/m as calculated from the following:   Height as of this encounter: 5\' 6"  (1.676 m).   Weight as of this encounter: 80.3 kg.     Code Status: DNR  Family Communication: None at bedside  Disposition Plan: Likely home   Consultants:  Oncology  Procedures:  None  Antimicrobials:  None  DVT prophylaxis: Xarelto   Objective: Vitals:   10/12/19 1042 10/12/19 1112 10/12/19 1131 10/12/19 1208  BP: 105/76 99/74 113/76 117/77  Pulse: 88 93 95 84  Resp: 18 20 18 18   Temp:  97.7 F (36.5 C) 97.8 F (36.6 C) 97.8 F (36.6 C)  TempSrc:  Oral Oral Oral  SpO2: 98% 100% 98% 100%  Weight:      Height:        Intake/Output Summary (Last 24 hours) at 10/12/2019 1552 Last data filed at 10/11/2019 1800 Gross per 24 hour  Intake 346.4 ml  Output --  Net 346.4 ml   Filed Weights   10/10/19 1155  Weight: (S) 80.3 kg    Exam:  General: NAD   Cardiovascular: S1, S2 present  Respiratory: CTAB  Abdomen: Soft, nontender, nondistended, bowel sounds present  Musculoskeletal: No bilateral pedal edema noted  Skin: Normal  Psychiatry: Normal mood   Data Reviewed: CBC: Recent Labs  Lab 10/10/19 1154 10/11/19 1027  WBC 5.1 6.0  NEUTROABS 3.8 4.3  HGB  6.5* 9.1*  HCT 19.7* 24.5*  MCV 96.6 97.6  PLT 397 [id number]   Basic Metabolic Panel: Recent Labs  Lab 10/10/19 1154 10/10/19 2040 10/11/19 0430 10/12/19 0515  NA 140 138 138 137  K 3.6 3.9 4.4 4.5  CL 107 104 107 108  CO2 22 21* 22 22  GLUCOSE 128* 214* 188* 166*  BUN 25* 27* 29* 27*  CREATININE 1.06* 1.09* 0.86 0.85  CALCIUM 8.8* 9.1 9.1 9.0   GFR: Estimated Creatinine Clearance: 62.1 mL/min (by C-G formula based on SCr of 0.85 mg/dL). Liver Function Tests: Recent Labs  Lab 10/10/19 1154 10/10/19 2040 10/11/19 0430 10/12/19 0515  AST 32 29 18 9*  ALT 24 26 21 16   ALKPHOS 40 43 42 36*  BILITOT 5.9* 4.7* 4.5* 1.8*  PROT 6.5 6.6 6.2* 7.6  ALBUMIN 3.9 3.9 3.8 3.2*   No results for input(s): LIPASE, AMYLASE in the last 168 hours. No results for input(s): AMMONIA in the last 168 hours. Coagulation Profile: Recent Labs  Lab 10/10/19 1154  INR 2.5*   Cardiac Enzymes: No results for input(s): CKTOTAL, CKMB, CKMBINDEX, TROPONINI in the last 168 hours. BNP (last 3 results) No results for input(s): PROBNP in the last 8760 hours. HbA1C: Recent Labs    10/11/19 2157  HGBA1C 5.1   CBG: Recent Labs  Lab 10/11/19 1646 10/11/19 2131 10/12/19 0743 10/12/19 1211  GLUCAP 219* 271* 129* 157*   Lipid Profile: No results for input(s): CHOL, HDL, LDLCALC, TRIG, CHOLHDL, LDLDIRECT in the last 72 hours. Thyroid Function Tests: No results for input(s): TSH, T4TOTAL, FREET4, T3FREE, THYROIDAB in the last 72 hours. Anemia Panel: Recent Labs    10/10/19 1342 10/10/19 2040 10/12/19 0515  VITAMINB12 1,446*  --   --   RETICCTPCT  --  6.0* 5.4*   Urine analysis:    Component Value Date/Time   COLORURINE YELLOW 05/23/2018 1941   APPEARANCEUR CLEAR 05/23/2018 1941   LABSPEC 1.028 05/23/2018 1941   PHURINE 7.0 05/23/2018 1941   GLUCOSEU NEGATIVE 05/23/2018 1941   HGBUR SMALL (A) 05/23/2018 1941   BILIRUBINUR NEGATIVE 05/23/2018 1941   KETONESUR NEGATIVE 05/23/2018 1941    PROTEINUR NEGATIVE 05/23/2018 1941   UROBILINOGEN 0.2 01/11/2014 1200   NITRITE NEGATIVE 05/23/2018 1941   LEUKOCYTESUR TRACE (A) 05/23/2018 1941   Sepsis Labs: @LABRCNTIP (procalcitonin:4,lacticidven:4)  ) Recent Results (from the past 240 hour(s))  SARS CORONAVIRUS 2 (TAT 6-24 HRS) Nasopharyngeal Nasopharyngeal Swab     Status: None   Collection Time: 10/03/19  6:54 PM   Specimen: Nasopharyngeal Swab  Result Value Ref Range Status   SARS Coronavirus 2 NEGATIVE NEGATIVE Final    Comment: (NOTE) SARS-CoV-2 target nucleic acids are NOT DETECTED. The SARS-CoV-2 RNA is generally detectable in upper and lower respiratory specimens during the acute phase of infection. Negative results do not preclude SARS-CoV-2 infection, do not rule out co-infections with other pathogens, and should not be used as the sole basis for treatment or other patient management decisions. Negative results must be combined with clinical observations, patient history,  and epidemiological information. The expected result is Negative. Fact Sheet for Patients: SugarRoll.be Fact Sheet for Healthcare Providers: https://www.woods-mathews.com/ This test is not yet approved or cleared by the [place] FDA and  has been authorized for detection and/or diagnosis of SARS-CoV-2 by FDA under an Emergency Use Authorization (EUA). This EUA will remain  in effect (meaning this test can be used) for the duration of the COVID-19 declaration under Section 56 4(b)(1) of the Act, 21 U.S.C. section 360bbb-3(b)(1), unless the authorization is terminated or revoked sooner. Performed at [facility], [place] [address], [place], [place] [address]   SARS CORONAVIRUS 2 (TAT 6-24 HRS) Nasopharyngeal Nasopharyngeal Swab     Status: None   Collection Time: 10/10/19  1:42 PM   Specimen: Nasopharyngeal Swab  Result Value Ref Range Status   SARS Coronavirus 2 NEGATIVE NEGATIVE Final     Comment: (NOTE) SARS-CoV-2 target nucleic acids are NOT DETECTED. The SARS-CoV-2 RNA is generally detectable in upper and lower respiratory specimens during the acute phase of infection. Negative results do not preclude SARS-CoV-2 infection, do not rule out co-infections with other pathogens, and should not be used as the sole basis for treatment or other patient management decisions. Negative results must be combined with clinical observations, patient history, and epidemiological information. The expected result is Negative. Fact Sheet for Patients: SugarRoll.be Fact Sheet for Healthcare Providers: https://www.woods-mathews.com/ This test is not yet approved or cleared by the [place] FDA and  has been authorized for detection and/or diagnosis of SARS-CoV-2 by FDA under an Emergency Use Authorization (EUA). This EUA will remain  in effect (meaning this test can be used) for the duration of the COVID-19 declaration under Section 56 4(b)(1) of the Act, 21 U.S.C. section 360bbb-3(b)(1), unless the authorization is terminated or revoked sooner. Performed at [facility], [place] [address], [place], [place] [address]   Respiratory Panel by PCR     Status: None   Collection Time: 10/11/19  5:10 AM   Specimen: Nasopharyngeal Swab; Respiratory  Result Value Ref Range Status   Adenovirus NOT DETECTED NOT DETECTED Final   Coronavirus 229E NOT DETECTED NOT DETECTED Final    Comment: (NOTE) The Coronavirus on the Respiratory Panel, DOES NOT test for the novel  Coronavirus (2019 nCoV)    Coronavirus HKU1 NOT DETECTED NOT DETECTED Final   Coronavirus NL63 NOT DETECTED NOT DETECTED Final   Coronavirus OC43 NOT DETECTED NOT DETECTED Final   Metapneumovirus NOT DETECTED NOT DETECTED Final   Rhinovirus / Enterovirus NOT DETECTED NOT DETECTED Final   Influenza A NOT DETECTED NOT DETECTED Final   Influenza B NOT DETECTED NOT DETECTED Final    Parainfluenza Virus 1 NOT DETECTED NOT DETECTED Final   Parainfluenza Virus 2 NOT DETECTED NOT DETECTED Final   Parainfluenza Virus 3 NOT DETECTED NOT DETECTED Final   Parainfluenza Virus 4 NOT DETECTED NOT DETECTED Final   Respiratory Syncytial Virus NOT DETECTED NOT DETECTED Final   Bordetella pertussis NOT DETECTED NOT DETECTED Final   Chlamydophila pneumoniae NOT DETECTED NOT DETECTED Final   Mycoplasma pneumoniae NOT DETECTED NOT DETECTED Final    Comment: Performed at [facility], [place]. [address] [place], [place] [address]      Studies: No results found.  Scheduled Meds: . B-complex with vitamin C  1 tablet Oral Daily  . benzonatate  200 mg Oral BID  . fenofibrate  160 mg Oral Daily  . fluticasone  1 spray Each Nare Daily  .  folic acid  5 mg Oral Daily  . guaiFENesin  600 mg Oral BID  . insulin aspart  0-5 Units Subcutaneous QHS  . insulin aspart  0-6 Units Subcutaneous TID WC  . metoprolol tartrate  25 mg Oral BID  . pantoprazole  40 mg Oral Q1200  . predniSONE  80 mg Oral Q breakfast  . rivaroxaban  20 mg Oral Daily  . cyanocobalamin  1,000 mcg Oral Daily    Continuous Infusions:    LOS: 1 day     Alma Friendly, MD [facility]  If 7PM-7AM, please contact night-coverage [email or web address] 10/12/2019, 3:52 PM

## 2019-10-12 NOTE — Progress Notes (Signed)
[name] Kitchen   HEMATOLOGY/ONCOLOGY INPATIENT PROGRESS NOTE  Date of Service: 10/12/2019  Inpatient Attending: .Alma Friendly, MD   SUBJECTIVE  Patient seen in hematology f/u this AM. She is tolerating her 2nd dose of IVIG without any acute issues. She notes that she is feeling much better today. Ambulating with minimal SOB. LDH, bilirubin improving. CBC in process from this AM -- delay in result-- RN checking up on this. Daughter at bed side.   OBJECTIVE:  NAD  PHYSICAL EXAMINATION: . Vitals:   10/12/19 1042 10/12/19 1112 10/12/19 1131 10/12/19 1208  BP: 105/76 99/74 113/76 117/77  Pulse: 88 93 95 84  Resp: 18 20 18 18   Temp:  97.7 F (36.5 C) 97.8 F (36.6 C) 97.8 F (36.6 C)  TempSrc:  Oral Oral Oral  SpO2: 98% 100% 98% 100%  Weight:      Height:       Filed Weights   10/10/19 1155  Weight: (S) 177 lb (80.3 kg)   .Body mass index is 28.57 kg/m.  GENERAL:alert, in no acute distress and comfortable SKIN: no acute rashes EYES: scleral icterus noted NECK: supple, no JVD LYMPH:  no palpable lymphadenopathy in the cervical, axillary or inguinal LUNGS: clear to auscultation with normal respiratory effort HEART: irreg, tachy ABDOMEN: abdomen soft, non-tender, normoactive bowel sounds  Musculoskeletal: no pedal edema PSYCH: alert & oriented x 3 with fluent speech NEURO: no focal motor/sensory deficits  MEDICAL HISTORY:  Past Medical History:  Diagnosis Date  . Chronic lower back pain   . Hypercholesteremia   . Hypertension    mild  . Obesity   . Paroxysmal atrial fibrillation (HCC)    managed with anticoagulation and rate control.   . Stroke [facility]) 2001 & 2015   "right side gets weak sometimes if I'm only tired" (03/16/2016)    SURGICAL HISTORY: Past Surgical History:  Procedure Laterality Date  . CARDIAC CATHETERIZATION  03/31/2000   EF 49%/LAD lg vessel which coursed to the apex & gave rise to 2 diagonal branches/ LAD noted to have 30% ostial lesion &  tapers to a sm vessel toward the apex but no high grade lesion/1st diagonal med sized vessel & 2nd diagonal sm vessel with no significant disease/ lt ventriculogram reveals mild global hypokinesis w/ an estimated EF of 45-50%  . CARDIAC CATHETERIZATION N/A 03/17/2016   Procedure: Left Heart Cath and Coronary Angiography;  Surgeon: Wellington Hampshire, MD;  Location: [facility];  Service: Cardiovascular;  Laterality: N/A;  . LEFT HEART CATH AND CORONARY ANGIOGRAPHY N/A 03/31/2018   Procedure: LEFT HEART CATH AND CORONARY ANGIOGRAPHY;  Surgeon: Martinique, Peter M, MD;  Location: [facility];  Service: Cardiovascular;  Laterality: N/A;    SOCIAL HISTORY: Social History   Socioeconomic History  . Marital status: Married    Spouse name: Not on file  . Number of children: 2  . Years of education: Not on file  . Highest education level: Not on file  Occupational History  . Occupation: Herbalist  Tobacco Use  . Smoking status: Former Smoker    Packs/day: 0.12    Years: 15.00    Pack years: 1.80    Types: Cigarettes    Quit date: 10/18/1978    Years since quitting: 41.0  . Smokeless tobacco: Never Used  Substance and Sexual Activity  . Alcohol use: Yes    Alcohol/week: 14.0 standard drinks    Types: 14 Glasses of wine per week  . Drug use: No  .  Sexual activity: Yes  Other Topics Concern  . Not on file  Social History Narrative  . Not on file   Social Determinants of Health   Financial Resource Strain:   . Difficulty of Paying Living Expenses: Not on file  Food Insecurity:   . Worried About Charity fundraiser in the Last Year: Not on file  . Ran Out of Food in the Last Year: Not on file  Transportation Needs:   . Lack of Transportation (Medical): Not on file  . Lack of Transportation (Non-Medical): Not on file  Physical Activity:   . Days of Exercise per Week: Not on file  . Minutes of Exercise per Session: Not on file  Stress:   . Feeling of Stress : Not on  file  Social Connections:   . Frequency of Communication with Friends and Family: Not on file  . Frequency of Social Gatherings with Friends and Family: Not on file  . Attends Religious Services: Not on file  . Active Member of Clubs or Organizations: Not on file  . Attends Archivist Meetings: Not on file  . Marital Status: Not on file  Intimate Partner Violence:   . Fear of Current or Ex-Partner: Not on file  . Emotionally Abused: Not on file  . Physically Abused: Not on file  . Sexually Abused: Not on file    FAMILY HISTORY: Family History  Problem Relation Age of Onset  . Heart disease Father   . Asthma Father   . Diabetes Father   . Heart disease Mother   . Heart disease Brother   . Hyperlipidemia Sister   . Hyperlipidemia Brother   . Hyperlipidemia Brother   . Cancer Neg Hx     ALLERGIES:  is allergic to sulfonamide derivatives; lipitor [atorvastatin]; pravachol [pravastatin sodium]; and statins.  MEDICATIONS:  Scheduled Meds: . B-complex with vitamin C  1 tablet Oral Daily  . benzonatate  200 mg Oral BID  . fenofibrate  160 mg Oral Daily  . fluticasone  1 spray Each Nare Daily  . folic acid  5 mg Oral Daily  . guaiFENesin  600 mg Oral BID  . insulin aspart  0-5 Units Subcutaneous QHS  . insulin aspart  0-6 Units Subcutaneous TID WC  . metoprolol tartrate  12.5 mg Oral BID  . pantoprazole  40 mg Oral Q1200  . predniSONE  80 mg Oral Q breakfast  . rivaroxaban  20 mg Oral Daily  . cyanocobalamin  1,000 mcg Oral Daily   Continuous Infusions:  PRN Meds:.acetaminophen **OR** acetaminophen, acetaminophen, diphenhydrAMINE, metoprolol tartrate, ondansetron **OR** ondansetron (ZOFRAN) IV, ondansetron (ZOFRAN) IV  REVIEW OF SYSTEMS:    10 Point review of Systems was done is negative except as noted above.   LABORATORY DATA:  I have reviewed the data as listed  . CBC Latest Ref Rng & Units 10/11/2019 10/10/2019 10/04/2019  WBC 4.0 - 10.5 K/uL 6.0 5.1  5.4  Hemoglobin 12.0 - 15.0 g/dL 9.1(L) 6.5(LL) 6.7(LL)  Hematocrit 36.0 - 46.0 % 24.5(L) 19.7(L) 19.9(L)  Platelets 150 - 400 K/uL 321 397 346    . CMP Latest Ref Rng & Units 10/12/2019 10/11/2019 10/10/2019  Glucose 70 - 99 mg/dL 166(H) 188(H) 214(H)  BUN 8 - 23 mg/dL 27(H) 29(H) 27(H)  Creatinine 0.44 - 1.00 mg/dL 0.85 0.86 1.09(H)  Sodium 135 - 145 mmol/L 137 138 138  Potassium 3.5 - 5.1 mmol/L 4.5 4.4 3.9  Chloride 98 - 111 mmol/L 108 107 104  CO2 22 - 32 mmol/L 22 22 21(L)  Calcium 8.9 - 10.3 mg/dL 9.0 9.1 9.1  Total Protein 6.5 - 8.1 g/dL 7.6 6.2(L) 6.6  Total Bilirubin 0.3 - 1.2 mg/dL 1.8(H) 4.5(H) 4.7(H)  Alkaline Phos 38 - 126 U/L 36(L) 42 43  AST 15 - 41 U/L 9(L) 18 29  ALT 0 - 44 U/L 16 21 26    Component     Latest Ref Rng & Units 10/03/2019 10/10/2019 10/11/2019  Adenovirus     NOT DETECTED   NOT DETECTED  Coronavirus 229E     NOT DETECTED   NOT DETECTED  Coronavirus HKU1     NOT DETECTED   NOT DETECTED  Coronavirus NL63     NOT DETECTED   NOT DETECTED  Coronavirus OC43     NOT DETECTED   NOT DETECTED  Metapneumovirus     NOT DETECTED   NOT DETECTED  Rhinovirus / Enterovirus     NOT DETECTED   NOT DETECTED  Influenza A     NOT DETECTED   NOT DETECTED  Influenza B     NOT DETECTED   NOT DETECTED  Parainfluenza Virus 1     NOT DETECTED   NOT DETECTED  Parainfluenza Virus 2     NOT DETECTED   NOT DETECTED  Parainfluenza Virus 3     NOT DETECTED   NOT DETECTED  Parainfluenza Virus 4     NOT DETECTED   NOT DETECTED  Respiratory Syncytial Virus     NOT DETECTED   NOT DETECTED  Bordetella pertussis     NOT DETECTED   NOT DETECTED  Chlamydophila pneumoniae     NOT DETECTED   NOT DETECTED  Mycoplasma pneumoniae     NOT DETECTED   NOT DETECTED  Influenza A By PCR     NEGATIVE NEGATIVE    Influenza B By PCR     NEGATIVE NEGATIVE    SARS Coronavirus 2     NEGATIVE NEGATIVE NEGATIVE   Troponin I (High Sensitivity)     <18 ng/L  7   B Natriuretic  Peptide     0.0 - 100.0 pg/mL  130.7 (H)   Vitamin B12     180 - 914 pg/mL  1,446 (H)        RADIOGRAPHIC STUDIES: I have personally reviewed the radiological images as listed and agreed with the findings in the report. DG Chest Port 1 View  Result Date: 10/10/2019 CLINICAL DATA:  Shortness of breath for 1 week, atrial fibrillation, persistent dry cough, COVID-19 negative on 10/03/2019 EXAM: PORTABLE CHEST 1 VIEW COMPARISON:  Portable exam 1200 hours compared to 10/03/2019 FINDINGS: Normal heart size, mediastinal contours, and pulmonary vascularity. Lungs clear. No pleural effusion or pneumothorax. Bones appear demineralized. IMPRESSION: No acute abnormalities. Electronically Signed   By: Lavonia Dana M.D.   On: 10/10/2019 12:20   DG Chest Portable 1 View  Result Date: 10/03/2019 CLINICAL DATA:  Cough, shortness of breath EXAM: PORTABLE CHEST 1 VIEW COMPARISON:  04/11/2018 FINDINGS: Heart and mediastinal contours are within normal limits. No focal opacities or effusions. No acute bony abnormality. IMPRESSION: No active disease. Electronically Signed   By: Rolm Baptise M.D.   On: 10/03/2019 17:44   ECHOCARDIOGRAM COMPLETE  Result Date: 10/11/2019   ECHOCARDIOGRAM REPORT   Patient Name:   Alexandra Pierce Date of Exam: 10/11/2019 Medical Rec #:  [id number]    Height:       66.0 in Accession #:  [id number]   Weight:       177.0 lb Date of Birth:  [birth year]     BSA:          1.90 m Patient Age:    32 years     BP:           108/73 mmHg Patient Gender: F            HR:           111 bpm. Exam Location:  Inpatient Procedure: 2D Echo Indications:    dyspnea 786.09  History:        Patient has prior history of Echocardiogram examinations, most                 recent 03/17/2016. Stroke; Risk Factors:Dyslipidemia and                 Hypertension. Paroxysmal a-fib.  Sonographer:    Jannett Celestine RDCS (AE) Referring Phys: [id number] [place]  1. Left ventricular ejection fraction, by  visual estimation, is 60 to 65%. The left ventricle has normal function. There is no left ventricular hypertrophy.  2. Left ventricular diastolic function could not be evaluated.  3. The left ventricle has no regional wall motion abnormalities.  4. Global right ventricle has normal systolic function.The right ventricular size is normal. No increase in right ventricular wall thickness.  5. Left atrial size was severely dilated.  6. Right atrial size was normal.  7. Trivial pericardial effusion is present.  8. The mitral valve is normal in structure. Trivial mitral valve regurgitation. No evidence of mitral stenosis.  9. The tricuspid valve is normal in structure. 10. The aortic valve is normal in structure. Aortic valve regurgitation is not visualized. Mild aortic valve sclerosis without stenosis. 11. The pulmonic valve was grossly normal. Pulmonic valve regurgitation is not visualized. 12. TR signal is inadequate for assessing pulmonary artery systolic pressure. 13. The inferior vena cava is dilated in size with <50% respiratory variability, suggesting right atrial pressure of 15 mmHg. FINDINGS  Left Ventricle: Left ventricular ejection fraction, by visual estimation, is 60 to 65%. The left ventricle has normal function. The left ventricle has no regional wall motion abnormalities. The left ventricular internal cavity size was the left ventricle is normal in size. There is no left ventricular hypertrophy. The left ventricular diastology could not be evaluated due to atrial fibrillation. Left ventricular diastolic function could not be evaluated. Right Ventricle: The right ventricular size is normal. No increase in right ventricular wall thickness. Global RV systolic function is has normal systolic function. Left Atrium: Left atrial size was severely dilated. Right Atrium: Right atrial size was normal in size Pericardium: Trivial pericardial effusion is present. Mitral Valve: The mitral valve is normal in structure.  There is mild thickening of the mitral valve leaflet(s). Trivial mitral valve regurgitation. No evidence of mitral valve stenosis by observation. Tricuspid Valve: The tricuspid valve is normal in structure. Tricuspid valve regurgitation is trivial. Aortic Valve: The aortic valve is normal in structure. Aortic valve regurgitation is not visualized. Mild aortic valve sclerosis is present, with no evidence of aortic valve stenosis. Pulmonic Valve: The pulmonic valve was grossly normal. Pulmonic valve regurgitation is not visualized. Pulmonic regurgitation is not visualized. Aorta: The aortic root, ascending aorta and aortic arch are all structurally normal, with no evidence of dilitation or obstruction. Venous: The inferior vena cava is dilated in size with less than 50% respiratory variability, suggesting right atrial pressure  of 15 mmHg. A roughly spherical 5 cm hypoechoic lesion is seen in the left lobe of the liver, possibly a cyst. IAS/Shunts: No atrial level shunt detected by color flow Doppler. There is no evidence of a patent foramen ovale. No ventricular septal defect is seen or detected. There is no evidence of an atrial septal defect.  LEFT VENTRICLE PLAX 2D LVIDd:         4.60 cm LVIDs:         2.90 cm LV PW:         1.20 cm LV IVS:        1.00 cm LVOT diam:     1.90 cm LV SV:         65 ml LV SV Index:   33.32 LVOT Area:     2.84 cm  RIGHT VENTRICLE RV S prime:     16.00 cm/s TAPSE (M-mode): 1.3 cm LEFT ATRIUM             Index LA diam:        4.50 cm 2.37 cm/m LA Vol (A2C):   70.6 ml 37.18 ml/m LA Vol (A4C):   78.9 ml 41.56 ml/m LA Biplane Vol: 75.3 ml 39.66 ml/m  AORTIC VALVE LVOT Vmax:   91.40 cm/s LVOT Vmean:  59.600 cm/s LVOT VTI:    0.175 m  AORTA Ao Root diam: 3.00 cm MITRAL VALVE MV Area (PHT): 4.06 cm            SHUNTS MV PHT:        54.23 msec          Systemic VTI:  0.18 m MV Decel Time: 187 msec            Systemic Diam: 1.90 cm MV E velocity: 98.50 cm/s 103 cm/s  Mihai Croitoru MD  Electronically signed by Sanda Klein MD Signature Date/Time: 10/11/2019/11:34:43 AM    Final     ASSESSMENT & PLAN:   73 y.o.female with  1.  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  Nonproductive cough -  Unclear etiology.  She has a normal chest x-ray.  COVID-19 testing , recent influenza panel and viral respiratory panel neg ?PND vs reflex 4. Atrial fibrillation on Xarelto -- with RVR  -- could be contributing to SOB in addition to symptomatic anemia. Rate better controlled today PLAN: -labs discussed with labs. Cbc/dif - no resulted. RN calling lab regarding ?delay. -patient clinical much improved with progressing improving hemolytic parameters. --tolerating 2nd dose out of 2 of IVIG today -tolerating Prednisone @ 80mg  po daily -- would reduce by 10mg /d every week till down to 40mg  po daily. -will need to be on PCP/PJP prophylaxis - given sulfa allergy --would likely need to consider starting on Atovaquone. -transfuse most compatible available PRBC as needed for HGB<7.5 or if symptomatic -mx of afib with RVR per hospitalist/cardiology -high dose folic acid and B complex to support accelerated hemolysis- should be continued on discharge -would re-use Rituxan weekly (early next year) vs consider Vincristine vs low dose cellcept. -if hgb remains>8.5 this evening/ tomorrow AM would be okay to discharge from hematology standpoint. -will send scheduling message to set for labs and transfusion support in 1 week upon discharge.  I spent 20 minutes counseling the patient face to face. The total time spent in the appointment was 25 minutes and more than 50% was on counseling and direct patient cares.    Sullivan Lone MD MS AAHIVMS [address]  Hematology/Oncology Physician [facility]  (Office):       [phone] (Work cell):  [phone] (Fax):           [phone]

## 2019-10-12 NOTE — Plan of Care (Signed)

## 2019-10-13 DIAGNOSIS — D649 Anemia, unspecified: Secondary | ICD-10-CM

## 2019-10-13 LAB — COMPREHENSIVE METABOLIC PANEL
ALT: 18 U/L (ref 0–44)
AST: 11 U/L — ABNORMAL LOW (ref 15–41)
Albumin: 3.2 g/dL — ABNORMAL LOW (ref 3.5–5.0)
Alkaline Phosphatase: 32 U/L — ABNORMAL LOW (ref 38–126)
Anion gap: 8 (ref 5–15)
BUN: 28 mg/dL — ABNORMAL HIGH (ref 8–23)
CO2: 23 mmol/L (ref 22–32)
Calcium: 8.7 mg/dL — ABNORMAL LOW (ref 8.9–10.3)
Chloride: 107 mmol/L (ref 98–111)
Creatinine, Ser: 0.9 mg/dL (ref 0.44–1.00)
GFR calc Af Amer: >60 mL/min (ref >60)
GFR calc non Af Amer: >60 mL/min (ref >60)
Glucose, Bld: 142 mg/dL — ABNORMAL HIGH (ref 70–99)
Potassium: 4.2 mmol/L (ref 3.5–5.1)
Sodium: 138 mmol/L (ref 135–145)
Total Bilirubin: 1.6 mg/dL — ABNORMAL HIGH (ref 0.3–1.2)
Total Protein: 8.7 g/dL — ABNORMAL HIGH (ref 6.5–8.1)

## 2019-10-13 LAB — CBC WITH DIFFERENTIAL/PLATELET
Abs Immature Granulocytes: 0.06 10*3/uL (ref 0.00–0.07)
Band Neutrophils: 16 %
Basophils Absolute: 0.1 10*3/uL (ref 0.0–0.1)
Basophils Relative: 2 %
Blasts: 0 %
Eosinophils Absolute: 0 10*3/uL (ref 0.0–0.5)
Eosinophils Relative: 0 %
HCT: 26.3 % — ABNORMAL LOW (ref 36.0–46.0)
Hemoglobin: 8.1 g/dL — ABNORMAL LOW (ref 12.0–15.0)
Lymphocytes Relative: 26 %
Lymphs Abs: 1.5 10*3/uL (ref 0.7–4.0)
MCH: 31.9 pg (ref 26.0–34.0)
MCHC: 30.8 g/dL (ref 30.0–36.0)
MCV: 103.5 fL — ABNORMAL HIGH (ref 80.0–100.0)
Metamyelocytes Relative: 0 %
Monocytes Absolute: 0.3 10*3/uL (ref 0.1–1.0)
Monocytes Relative: 6 %
Myelocytes: 0 %
Neutro Abs: 3.7 10*3/uL (ref 1.7–7.7)
Neutrophils Relative %: 49 %
Other: 0 %
Platelets: 324 10*3/uL (ref 150–400)
Promyelocytes Relative: 1 %
RBC: 2.54 MIL/uL — ABNORMAL LOW (ref 3.87–5.11)
RDW: 20.3 % — ABNORMAL HIGH (ref 11.5–15.5)
WBC: 5.7 10*3/uL (ref 4.0–10.5)
nRBC: 21 /100 WBC — ABNORMAL HIGH

## 2019-10-13 LAB — GLUCOSE, CAPILLARY
Glucose-Capillary: 108 mg/dL — ABNORMAL HIGH (ref 70–99)
Glucose-Capillary: 219 mg/dL — ABNORMAL HIGH (ref 70–99)

## 2019-10-13 MED ORDER — FOLIC ACID 1 MG PO TABS: 5.0000 mg | ORAL_TABLET | Freq: Every day | ORAL | 22 refills | Status: AC

## 2019-10-13 MED ORDER — GUAIFENESIN ER 600 MG PO TB12: 600.0000 mg | ORAL_TABLET | Freq: Two times a day (BID) | ORAL | 0 refills | Status: AC | PRN

## 2019-10-13 MED ORDER — PREDNISONE 10 MG PO TABS: ORAL_TABLET | ORAL | 0 refills | Status: DC

## 2019-10-13 MED ORDER — ESOMEPRAZOLE MAGNESIUM 40 MG PO CPDR: 40.0000 mg | DELAYED_RELEASE_CAPSULE | Freq: Every day | ORAL | 0 refills | Status: DC

## 2019-10-13 NOTE — Progress Notes (Signed)
Pt to be discharged to home this afternoon. Discharge instructions/teaching and all Medications and schedules reviewed with Pt. Pt verbalized understanding. Discharge packet with Pt at time of discharge .

## 2019-10-13 NOTE — Progress Notes (Signed)
Physical Therapy Treatment Patient Details Name: Alexandra Pierce MRN: [id number] DOB: Jul 30, 1946 Today's Date: 10/13/2019    History of Present Illness Pt admitted with symptomatic anemia and SOB.  PT wtih hx of PAF, ARF, CVA (15) and myelodysplastic syndrome    PT Comments    Pt up to ambulate in hall with continuing improvement in stability and pt maintaining SaO2 at 92% or higher on RA - HR elevated over 120 with onset of coughing.  Pt hopeful for return home this date.  Follow Up Recommendations  No PT follow up     Equipment Recommendations  None recommended by PT    Recommendations for Other Services OT consult     Precautions / Restrictions Precautions Precautions: Fall Precaution Comments: monitor sats Restrictions Weight Bearing Restrictions: No    Mobility  Bed Mobility                  Transfers Overall transfer level: Modified independent Equipment used: None             General transfer comment: unassisted  Ambulation/Gait Ambulation/Gait assistance: Supervision Gait Distance (Feet): 100 Feet(twice) Assistive device: None Gait Pattern/deviations: Step-through pattern;Decreased step length - right;Decreased step length - left;Shuffle;Wide base of support     General Gait Details: mild general instability but no LOB; pt maintained O2 sats at 92% or higher on RA but HR elevated over 120 bpm with onset of coughing   Stairs             Wheelchair Mobility    Modified Rankin (Stroke Patients Only)       Balance Overall balance assessment: Needs assistance Sitting-balance support: Feet supported;No upper extremity supported Sitting balance-Leahy Scale: Normal     Standing balance support: No upper extremity supported Standing balance-Leahy Scale: Good                              Cognition Arousal/Alertness: Awake/alert Behavior During Therapy: WFL for tasks assessed/performed Overall Cognitive Status: Within  Functional Limits for tasks assessed                                        Exercises      General Comments        Pertinent Vitals/Pain Pain Assessment: No/denies pain    Home Living                      Prior Function            PT Goals (current goals can now be found in the care plan section) Acute Rehab PT Goals Patient Stated Goal: HOME PT Goal Formulation: With patient Time For Goal Achievement: 10/26/19 Potential to Achieve Goals: Good Progress towards PT goals: Progressing toward goals    Frequency    Min 3X/week      PT Plan Current plan remains appropriate    Co-evaluation              AM-PAC PT "6 Clicks" Mobility   Outcome Measure  Help needed turning from your back to your side while in a flat bed without using bedrails?: None Help needed moving from lying on your back to sitting on the side of a flat bed without using bedrails?: None Help needed moving to and from a bed to a chair (including a wheelchair)?:  A Little Help needed standing up from a chair using your arms (e.g., wheelchair or bedside chair)?: None Help needed to walk in hospital room?: A Little Help needed climbing 3-5 steps with a railing? : A Little 6 Click Score: 21    End of Session Equipment Utilized During Treatment: Gait belt Activity Tolerance: Patient tolerated treatment well Patient left: in chair;with call bell/phone within reach;with chair alarm set Nurse Communication: Mobility status PT Visit Diagnosis: Difficulty in walking, not elsewhere classified (R26.2);Muscle weakness (generalized) (M62.81)     Time: 5366-4403 PT Time Calculation (min) (ACUTE ONLY): 14 min  Charges:  $Gait Training: 8-22 mins                     Alexandra Pierce PT Acute Rehabilitation Services Pager [phone] Office [phone]    Alexandra Pierce 10/13/2019, 12:44 PM

## 2019-10-13 NOTE — Progress Notes (Signed)
Ms. Zelaya is absolutely delightful.  She is incredibly perky.  She would like to go home today.  She feels well.  There is no CBC back yet.  I do think that she would be able to go home.  If you look at her hemolysis parameters, they all seem to be improving.  Her bilirubin was 1.5 yesterday.  The LDH was down to 467.  Her reticulocyte count, when corrected was probably a little over 3%.  I think some of the anemia is probably dilutional from the IVIG.  She has had no fever.  She has had no obvious bleeding.  She has had no nausea or vomiting.  There are no mouth sores.  She has no headache.  Her vital signs show temperature 97.8.  Pulse 98.  Blood pressure 108/69.  Head neck exam shows no scleral icterus.  She has no oral lesions.  There is no thrush.  There is no adenopathy in the neck.  Lungs are clear bilaterally.  Cardiac exam regular rate and rhythm with no murmurs, rubs or bruits.  Abdomen is soft.  She has good bowel sounds.  There is no fluid wave.  I cannot palpate her liver or spleen.  Extremities shows no clubbing, cyanosis or edema.  Neurological exam shows no focal deficits.  Skin exam shows no rashes.  She has no ecchymosis or petechia.  Ms. Rouch has multiple hematologic issues.  The current is the autoimmune hemolytic anemia.  She has gotten IVIG.  She is on prednisone.  She is on folic acid.  I think she is stable for discharge.  Again, she feels well.  She would like to go home.  She has a lot of help at home.  She will be able to follow-up with Dr. Irene Limbo early next week.  I appreciate all the great care that she obviously has gotten from all the staff on 4 E.  Lattie Haw, MD  Psalm 143:8

## 2019-10-13 NOTE — Discharge Summary (Signed)
Discharge Summary  Alexandra Pierce [id number] DOB: Sep 01, 1946  PCP: Chesley Noon, MD  Admit date: 10/10/2019 Discharge date: 10/13/2019  Time spent: 40 mins  Recommendations for Outpatient Follow-up:  1. Follow-up with PCP in 1 week 2. Follow-up with heme/onc as scheduled/recommended  Discharge Diagnoses:  Active Hospital Problems   Diagnosis Date Noted  . Symptomatic anemia 05/22/2018  . Anemia 10/10/2019  . MDS (myelodysplastic syndrome) ([facility]) 09/26/2019  . 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 Essential hypertension 07/18/2007  . Atrial fibrillation ([facility]) 07/18/2007  . History of cardiovascular disorder 07/18/2007    Resolved Hospital Problems  No resolved problems to display.    Discharge Condition: Stable  Diet recommendation: Modified carb  Vitals:   10/13/19 0449 10/13/19 0943  BP: 108/69 110/68  Pulse: 98 96  Resp: 18   Temp: 97.8 F (36.6 C)   SpO2: 96%     History of present illness:  Alexandra Pierce a 73 y.o.femalewith medical history significant ofparoxysmal atrial fibrillation on Xarelto, MDS/refractory anemia with ringed sideroblasts and warm antibody hemolytic anemia, ongoing anemia requiring intermittent blood transfusions presented to the ER with shortness of breath for 1 week and persistent dry cough.Patient reports that she requires intermittent transfusions for her anemia and last transfusion approximately 2 weeks ago,she used to be on prednisone for hemolytic anemia, her oncologist weaned her off prednisone about 2 weeks ago. Presented to her PCP, noted to be tachypneic, was advised to go to the ED. In the ED, hemoglobin 6.5, chest x-ray clear, COVID-19 PCR negative.  Patient admitted for further management.    Patient seen and examined at bedside.  Denies any new complaints, denies any chest pain, shortness of breath, abdominal pain, nausea/vomiting, headaches, no obvious signs of bleeding, fever/chills.  Patient very eager to  be discharged.  Patient stable for discharge from an heme-onc standpoint.  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 Irene Limbo on board, recommend prednisone 80 mg p.o. daily with slow taper over 2 to 3 months, high-dose folic acid and B complex, upon discharge  S/p IVIG for 2 doses Continue tapered dose of prednisone Completed 2 doses of IVIG Follow-up with heme-onc within 1 week  Nonproductive cough Unclear etiology COVID-19 PCR and influenza negative Continue PPI, ??GERD Continue cough suppressants as needed Follow-up with PCP  Paroxysmal A. Fib Heart rate stable Echo with EF of 60 to 65%, left ventricular diastolic function could not be evaluated, no regional wall motion abnormalities Continue p.o. home metoprolol Continue Xarelto  Hyperglycemia likely due to steroids Hemoglobin A1c 5.1  Advised patient to maintain a moderate carb diet while on steroids Follow-up with PCP  History of TIA/CVA Continue Xarelto          Malnutrition Type:      Malnutrition Characteristics:      Nutrition Interventions:      Estimated body mass index is 28.57 kg/m as calculated from the following:   Height as of this encounter: 5\' 6"  (1.676 m).   Weight as of this encounter: 80.3 kg.    Procedures:  None  Consultations:  Heme-onc  Discharge Exam: BP 110/68  Pulse 96   Temp 97.8 F (36.6 C) (Oral)   Resp 18   Ht (S) 5\' 6"  (1.676 m)   Wt (S) 80.3 kg   SpO2 96%   BMI 28.57 kg/m   General: NAD Cardiovascular: S1, S2 present Respiratory: CTA  B  Discharge Instructions You were cared for by a hospitalist during your hospital stay. If you have any questions about your discharge medications or the care you received while you were in the hospital after you are discharged, you can call the unit and asked to speak with the hospitalist on call if the hospitalist that took care of you is not available. Once you are discharged, your primary care physician will handle any further medical issues. Please note that NO REFILLS for any discharge medications will be authorized once you are discharged, as it is imperative that you return to your primary care physician (or establish a relationship with a primary care physician if you do not have one) for your aftercare needs so that they can reassess your need for medications and monitor your lab values.  Discharge Instructions    Diet - low sodium heart healthy   Complete by: As directed    Increase activity slowly   Complete by: As directed      Allergies as of 10/13/2019      Reactions   Sulfonamide Derivatives Anaphylaxis   Lipitor [atorvastatin] Other (See Comments)   Caused bladder infection   Pravachol [pravastatin Sodium] Other (See Comments)   Myalgias   Statins Other (See Comments)   Myalgia      Medication List    TAKE these medications   b complex vitamins capsule Take 1 capsule by mouth daily.   benzonatate 100 MG capsule Commonly known as: TESSALON Take 1 capsule (100 mg total) by mouth every 8 (eight) hours as needed for cough.   cholecalciferol 1000 units tablet Commonly known as: VITAMIN D Take 1,000 Units by mouth daily.   cyanocobalamin 1000 MCG tablet Take 1,000 mcg by mouth daily. What changed: Another medication with the same name was removed. Continue taking this medication, and follow the directions you see here.   esomeprazole 40 MG capsule Commonly known as: NEXIUM Take 1 capsule (40 mg total) by mouth daily. What changed: See the new instructions.    fenofibrate 160 MG tablet Take 1 tablet (160 mg total) by mouth daily.   fluticasone 50 MCG/ACT nasal spray Commonly known as: FLONASE Place 1 spray into both nostrils daily.   folic acid 1 MG tablet Commonly known as: FOLVITE Take 5 tablets (5 mg total) by mouth daily. What changed: how much to take   guaiFENesin 600 MG 12 hr tablet Commonly known as: MUCINEX Take 1 tablet (600 mg total) by mouth 2 (two) times daily as needed for up to 7 days.   metoprolol tartrate 25 MG tablet Commonly known as: LOPRESSOR Take 0.5 tablets (12.5 mg total) by mouth 2 (two) times daily. What changed:   how much to take  when to take this   predniSONE 10 MG tablet Commonly known as: DELTASONE Take 8 tabs daily for 4 days, then 7 tabs daily for 7 days, then 6 tabs daily for 7 days, then 5 tabs daily for 7 days, then 4 tabs daily till complete   VITAMIN C PO Take 1 tablet by mouth daily.   Xarelto 20 MG Tabs tablet Generic drug: rivaroxaban TAKE 1 TABLET BY MOUTH ONCE DAILY What changed: how much to take  Allergies  Allergen Reactions  . Sulfonamide Derivatives Anaphylaxis  . Lipitor [Atorvastatin] Other (See Comments)    Caused bladder infection  . Pravachol [Pravastatin Sodium] Other (See Comments)    Myalgias  . Statins Other (See Comments)    Myalgia   Follow-up Information    Chesley Noon, MD. Schedule an appointment as soon as possible for a visit in 1 week(s).   Specialty: Family Medicine Contact information: [place] [address] [phone]        Burtis Junes, NP .   Specialties: Nurse Practitioner, Interventional Cardiology, Cardiology, Radiology Contact information: [address] [place] [address] [phone]            The results of significant diagnostics from this hospitalization (including imaging, microbiology, ancillary and laboratory) are listed below for reference.    Significant  Diagnostic Studies: DG Chest Port 1 View  Result Date: 10/10/2019 CLINICAL DATA:  Shortness of breath for 1 week, atrial fibrillation, persistent dry cough, COVID-19 negative on 10/03/2019 EXAM: PORTABLE CHEST 1 VIEW COMPARISON:  Portable exam 1200 hours compared to 10/03/2019 FINDINGS: Normal heart size, mediastinal contours, and pulmonary vascularity. Lungs clear. No pleural effusion or pneumothorax. Bones appear demineralized. IMPRESSION: No acute abnormalities. Electronically Signed   By: Lavonia Dana M.D.   On: 10/10/2019 12:20   DG Chest Portable 1 View  Result Date: 10/03/2019 CLINICAL DATA:  Cough, shortness of breath EXAM: PORTABLE CHEST 1 VIEW COMPARISON:  04/11/2018 FINDINGS: Heart and mediastinal contours are within normal limits. No focal opacities or effusions. No acute bony abnormality. IMPRESSION: No active disease. Electronically Signed   By: Rolm Baptise M.D.   On: 10/03/2019 17:44   ECHOCARDIOGRAM COMPLETE  Result Date: 10/11/2019   ECHOCARDIOGRAM REPORT   Patient Name:   LINDSIE MESSENGER Date of Exam: 10/11/2019 Medical Rec #:  [id number]    Height:       66.0 in Accession #:    [id number]   Weight:       177.0 lb Date of Birth:  May 06, 1946     BSA:          1.90 m Patient Age:    2 years     BP:           108/73 mmHg Patient Gender: F            HR:           111 bpm. Exam Location:  Inpatient Procedure: 2D Echo Indications:    dyspnea 786.09  History:        Patient has prior history of Echocardiogram examinations, most                 recent 03/17/2016. Stroke; Risk Factors:Dyslipidemia and                 Hypertension. Paroxysmal a-fib.  Sonographer:    Jannett Celestine RDCS (AE) Referring Phys: [id number] [place]  1. Left ventricular ejection fraction, by visual estimation, is 60 to 65%. The left ventricle has normal function. There is no left ventricular hypertrophy.  2. Left ventricular diastolic function could not be evaluated.  3. The left ventricle has no  regional wall motion abnormalities.  4. Global right ventricle has normal systolic function.The right ventricular size is normal. No increase in right ventricular wall thickness.  5. Left atrial size was severely dilated.  6. Right atrial size was normal.  7. Trivial pericardial effusion is  present.  8. The mitral valve is normal in structure. Trivial mitral valve regurgitation. No evidence of mitral stenosis.  9. The tricuspid valve is normal in structure. 10. The aortic valve is normal in structure. Aortic valve regurgitation is not visualized. Mild aortic valve sclerosis without stenosis. 11. The pulmonic valve was grossly normal. Pulmonic valve regurgitation is not visualized. 12. TR signal is inadequate for assessing pulmonary artery systolic pressure. 13. The inferior vena cava is dilated in size with <50% respiratory variability, suggesting right atrial pressure of 15 mmHg. FINDINGS  Left Ventricle: Left ventricular ejection fraction, by visual estimation, is 60 to 65%. The left ventricle has normal function. The left ventricle has no regional wall motion abnormalities. The left ventricular internal cavity size was the left ventricle is normal in size. There is no left ventricular hypertrophy. The left ventricular diastology could not be evaluated due to atrial fibrillation. Left ventricular diastolic function could not be evaluated. Right Ventricle: The right ventricular size is normal. No increase in right ventricular wall thickness. Global RV systolic function is has normal systolic function. Left Atrium: Left atrial size was severely dilated. Right Atrium: Right atrial size was normal in size Pericardium: Trivial pericardial effusion is present. Mitral Valve: The mitral valve is normal in structure. There is mild thickening of the mitral valve leaflet(s). Trivial mitral valve regurgitation. No evidence of mitral valve stenosis by observation. Tricuspid Valve: The tricuspid valve is normal in structure.  Tricuspid valve regurgitation is trivial. Aortic Valve: The aortic valve is normal in structure. Aortic valve regurgitation is not visualized. Mild aortic valve sclerosis is present, with no evidence of aortic valve stenosis. Pulmonic Valve: The pulmonic valve was grossly normal. Pulmonic valve regurgitation is not visualized. Pulmonic regurgitation is not visualized. Aorta: The aortic root, ascending aorta and aortic arch are all structurally normal, with no evidence of dilitation or obstruction. Venous: The inferior vena cava is dilated in size with less than 50% respiratory variability, suggesting right atrial pressure of 15 mmHg. A roughly spherical 5 cm hypoechoic lesion is seen in the left lobe of the liver, possibly a cyst. IAS/Shunts: No atrial level shunt detected by color flow Doppler. There is no evidence of a patent foramen ovale. No ventricular septal defect is seen or detected. There is no evidence of an atrial septal defect.  LEFT VENTRICLE PLAX 2D LVIDd:         4.60 cm LVIDs:         2.90 cm LV PW:         1.20 cm LV IVS:        1.00 cm LVOT diam:     1.90 cm LV SV:         65 ml LV SV Index:   33.32 LVOT Area:     2.84 cm  RIGHT VENTRICLE RV S prime:     16.00 cm/s TAPSE (M-mode): 1.3 cm LEFT ATRIUM             Index LA diam:        4.50 cm 2.37 cm/m LA Vol (A2C):   70.6 ml 37.18 ml/m LA Vol (A4C):   78.9 ml 41.56 ml/m LA Biplane Vol: 75.3 ml 39.66 ml/m  AORTIC VALVE LVOT Vmax:   91.40 cm/s LVOT Vmean:  59.600 cm/s LVOT VTI:    0.175 m  AORTA Ao Root diam: 3.00 cm MITRAL VALVE MV Area (PHT): 4.06 cm            SHUNTS MV PHT:  54.23 msec          Systemic VTI:  0.18 m MV Decel Time: 187 msec            Systemic Diam: 1.90 cm MV E velocity: 98.50 cm/s 103 cm/s  Mihai Croitoru MD Electronically signed by Sanda Klein MD Signature Date/Time: 10/11/2019/11:34:43 AM    Final     Microbiology: Recent Results (from the past 240 hour(s))  SARS CORONAVIRUS 2 (TAT 6-24 HRS) Nasopharyngeal  Nasopharyngeal Swab     Status: None   Collection Time: 10/03/19  6:54 PM   Specimen: Nasopharyngeal Swab  Result Value Ref Range Status   SARS Coronavirus 2 NEGATIVE NEGATIVE Final    Comment: (NOTE) SARS-CoV-2 target nucleic acids are NOT DETECTED. The SARS-CoV-2 RNA is generally detectable in upper and lower respiratory specimens during the acute phase of infection. Negative results do not preclude SARS-CoV-2 infection, do not rule out co-infections with other pathogens, and should not be used as the sole basis for treatment or other patient management decisions. Negative results must be combined with clinical observations, patient history, and epidemiological information. The expected result is Negative. Fact Sheet for Patients: SugarRoll.be Fact Sheet for Healthcare Providers: https://www.woods-mathews.com/ This test is not yet approved or cleared by the [place] FDA and  has been authorized for detection and/or diagnosis of SARS-CoV-2 by FDA under an Emergency Use Authorization (EUA). This EUA will remain  in effect (meaning this test can be used) for the duration of the COVID-19 declaration under Section 56 4(b)(1) of the Act, 21 U.S.C. section 360bbb-3(b)(1), unless the authorization is terminated or revoked sooner. Performed at [facility], [place] [address] [place], [place] [address]   SARS CORONAVIRUS 2 (TAT 6-24 HRS) Nasopharyngeal Nasopharyngeal Swab     Status: None   Collection Time: 10/10/19  1:42 PM   Specimen: Nasopharyngeal Swab  Result Value Ref Range Status   SARS Coronavirus 2 NEGATIVE NEGATIVE Final    Comment: (NOTE) SARS-CoV-2 target nucleic acids are NOT DETECTED. The SARS-CoV-2 RNA is generally detectable in upper and lower respiratory specimens during the acute phase of infection. Negative results do not preclude SARS-CoV-2 infection, do not rule out co-infections with other pathogens, and should not  be used as the sole basis for treatment or other patient management decisions. Negative results must be combined with clinical observations, patient history, and epidemiological information. The expected result is Negative. Fact Sheet for Patients: SugarRoll.be Fact Sheet for Healthcare Providers: https://www.woods-mathews.com/ This test is not yet approved or cleared by the [place] FDA and  has been authorized for detection and/or diagnosis of SARS-CoV-2 by FDA under an Emergency Use Authorization (EUA). This EUA will remain  in effect (meaning this test can be used) for the duration of the COVID-19 declaration under Section 56 4(b)(1) of the Act, 21 U.S.C. section 360bbb-3(b)(1), unless the authorization is terminated or revoked sooner. Performed at [facility], [place] [address], [place], [place] [address]   Respiratory Panel by PCR     Status: None   Collection Time: 10/11/19  5:10 AM   Specimen: Nasopharyngeal Swab; Respiratory  Result Value Ref Range Status   Adenovirus NOT DETECTED NOT DETECTED Final   Coronavirus 229E NOT DETECTED NOT DETECTED Final    Comment: (NOTE) The Coronavirus on the Respiratory Panel, DOES NOT test for the novel  Coronavirus (2019 nCoV)    Coronavirus HKU1 NOT DETECTED NOT DETECTED Final   Coronavirus NL63 NOT DETECTED NOT DETECTED Final  Coronavirus OC43 NOT DETECTED NOT DETECTED Final   Metapneumovirus NOT DETECTED NOT DETECTED Final   Rhinovirus / Enterovirus NOT DETECTED NOT DETECTED Final   Influenza A NOT DETECTED NOT DETECTED Final   Influenza B NOT DETECTED NOT DETECTED Final   Parainfluenza Virus 1 NOT DETECTED NOT DETECTED Final   Parainfluenza Virus 2 NOT DETECTED NOT DETECTED Final   Parainfluenza Virus 3 NOT DETECTED NOT DETECTED Final   Parainfluenza Virus 4 NOT DETECTED NOT DETECTED Final   Respiratory Syncytial Virus NOT DETECTED NOT DETECTED Final   Bordetella pertussis NOT  DETECTED NOT DETECTED Final   Chlamydophila pneumoniae NOT DETECTED NOT DETECTED Final   Mycoplasma pneumoniae NOT DETECTED NOT DETECTED Final    Comment: Performed at [facility], [place] [address] [place], [place] [address]     Labs: Basic Metabolic Panel: Recent Labs  Lab 10/10/19 1154 10/10/19 2040 10/11/19 0430 10/12/19 0515 10/13/19 0519  NA 140 138 138 137 138  K 3.6 3.9 4.4 4.5 4.2  CL 107 104 107 108 107  CO2 22 21* 22 22 23   GLUCOSE 128* 214* 188* 166* 142*  BUN 25* 27* 29* 27* 28*  CREATININE 1.06* 1.09* 0.86 0.85 0.90  CALCIUM 8.8* 9.1 9.1 9.0 8.7*   Liver Function Tests: Recent Labs  Lab 10/10/19 1154 10/10/19 2040 10/11/19 0430 10/12/19 0515 10/13/19 0519  AST 32 29 18 9* 11*  ALT 24 26 21 16 18   ALKPHOS 40 43 42 36* 32*  BILITOT 5.9* 4.7* 4.5* 1.8* 1.6*  PROT 6.5 6.6 6.2* 7.6 8.7*  ALBUMIN 3.9 3.9 3.8 3.2* 3.2*   No results for input(s): LIPASE, AMYLASE in the last 168 hours. No results for input(s): AMMONIA in the last 168 hours. CBC: Recent Labs  Lab 10/10/19 1154 10/11/19 1027 10/12/19 1750 10/13/19 0519  WBC 5.1 6.0 7.1 5.7  NEUTROABS 3.8 4.3 6.0 PENDING  HGB 6.5* 9.1* 8.2* 8.1*  HCT 19.7* 24.5* 25.4* 26.3*  MCV 96.6 97.6 97.3 103.5*  PLT 397 321 347 324   Cardiac Enzymes: No results for input(s): CKTOTAL, CKMB, CKMBINDEX, TROPONINI in the last 168 hours. BNP: BNP (last 3 results) Recent Labs    10/10/19 1154  BNP 130.7*    ProBNP (last 3 results) No results for input(s): PROBNP in the last 8760 hours.  CBG: Recent Labs  Lab 10/12/19 0743 10/12/19 1211 10/12/19 1710 10/12/19 2017 10/13/19 0748  GLUCAP 129* 157* 347* 248* 108*       Signed:  Alma Friendly, MD [facility] 10/13/2019, 11:25 AM

## 2019-10-15 ENCOUNTER — Other Ambulatory Visit: Payer: Self-pay | Admitting: *Deleted

## 2019-10-15 DIAGNOSIS — D461 Refractory anemia with ring sideroblasts: Secondary | ICD-10-CM

## 2019-10-15 LAB — FOLATE RBC
Folate, RBC: UNDETERMINED ng/mL
Hematocrit: 22.6 % — ABNORMAL LOW (ref 34.0–46.6)

## 2019-10-15 LAB — HEMATOLOGY COMMENTS:

## 2019-10-16 ENCOUNTER — Telehealth: Payer: Self-pay | Admitting: Hematology

## 2019-10-16 NOTE — Telephone Encounter (Signed)
Scheduled appt per 12/28 sch message - unable to reach pt /unable to leave vmail   . Messaged RN so she is aware pt may need another call.

## 2019-10-18 ENCOUNTER — Other Ambulatory Visit: Payer: Self-pay

## 2019-10-18 ENCOUNTER — Other Ambulatory Visit: Payer: Self-pay | Admitting: *Deleted

## 2019-10-18 ENCOUNTER — Telehealth: Payer: Self-pay | Admitting: *Deleted

## 2019-10-18 ENCOUNTER — Inpatient Hospital Stay: Payer: Medicare Other

## 2019-10-18 DIAGNOSIS — Z7901 Long term (current) use of anticoagulants: Secondary | ICD-10-CM | POA: Diagnosis not present

## 2019-10-18 DIAGNOSIS — Z8673 Personal history of transient ischemic attack (TIA), and cerebral infarction without residual deficits: Secondary | ICD-10-CM | POA: Diagnosis not present

## 2019-10-18 DIAGNOSIS — D461 Refractory anemia with ring sideroblasts: Secondary | ICD-10-CM

## 2019-10-18 DIAGNOSIS — E669 Obesity, unspecified: Secondary | ICD-10-CM | POA: Diagnosis not present

## 2019-10-18 DIAGNOSIS — R531 Weakness: Secondary | ICD-10-CM | POA: Diagnosis not present

## 2019-10-18 DIAGNOSIS — I480 Paroxysmal atrial fibrillation: Secondary | ICD-10-CM | POA: Diagnosis not present

## 2019-10-18 DIAGNOSIS — I10 Essential (primary) hypertension: Secondary | ICD-10-CM | POA: Diagnosis not present

## 2019-10-18 DIAGNOSIS — E785 Hyperlipidemia, unspecified: Secondary | ICD-10-CM | POA: Diagnosis not present

## 2019-10-18 DIAGNOSIS — D473 Essential (hemorrhagic) thrombocythemia: Secondary | ICD-10-CM | POA: Diagnosis present

## 2019-10-18 LAB — CMP (CANCER CENTER ONLY)
ALT: 24 U/L (ref 0–44)
AST: 18 U/L (ref 15–41)
Albumin: 3.7 g/dL (ref 3.5–5.0)
Alkaline Phosphatase: 40 U/L (ref 38–126)
Anion gap: 8 (ref 5–15)
BUN: 23 mg/dL (ref 8–23)
CO2: 27 mmol/L (ref 22–32)
Calcium: 8.4 mg/dL — ABNORMAL LOW (ref 8.9–10.3)
Chloride: 103 mmol/L (ref 98–111)
Creatinine: 0.96 mg/dL (ref 0.44–1.00)
GFR, Est AFR Am: >60 mL/min (ref >60)
GFR, Estimated: 58 mL/min — ABNORMAL LOW (ref >60)
Glucose, Bld: 112 mg/dL — ABNORMAL HIGH (ref 70–99)
Potassium: 3.2 mmol/L — ABNORMAL LOW (ref 3.5–5.1)
Sodium: 138 mmol/L (ref 135–145)
Total Bilirubin: 2.4 mg/dL — ABNORMAL HIGH (ref 0.3–1.2)
Total Protein: 7.7 g/dL (ref 6.5–8.1)

## 2019-10-18 LAB — CBC WITH DIFFERENTIAL (CANCER CENTER ONLY)
Abs Immature Granulocytes: 0.09 10*3/uL — ABNORMAL HIGH (ref 0.00–0.07)
Basophils Absolute: 0 10*3/uL (ref 0.0–0.1)
Basophils Relative: 1 %
Eosinophils Absolute: 0 10*3/uL (ref 0.0–0.5)
Eosinophils Relative: 1 %
HCT: 31 % — ABNORMAL LOW (ref 36.0–46.0)
Hemoglobin: 10.2 g/dL — ABNORMAL LOW (ref 12.0–15.0)
Immature Granulocytes: 1 %
Lymphocytes Relative: 22 %
Lymphs Abs: 1.4 10*3/uL (ref 0.7–4.0)
MCH: 32 pg (ref 26.0–34.0)
MCHC: 32.9 g/dL (ref 30.0–36.0)
MCV: 97.2 fL (ref 80.0–100.0)
Monocytes Absolute: 0.4 10*3/uL (ref 0.1–1.0)
Monocytes Relative: 6 %
Neutro Abs: 4.4 10*3/uL (ref 1.7–7.7)
Neutrophils Relative %: 69 %
Platelet Count: 309 10*3/uL (ref 150–400)
RBC: 3.19 MIL/uL — ABNORMAL LOW (ref 3.87–5.11)
RDW: 19.9 % — ABNORMAL HIGH (ref 11.5–15.5)
WBC Count: 6.2 10*3/uL (ref 4.0–10.5)
nRBC: 1 % — ABNORMAL HIGH (ref 0.0–0.2)

## 2019-10-18 LAB — SAMPLE TO BLOOD BANK

## 2019-10-18 LAB — LACTATE DEHYDROGENASE: LDH: 435 U/L — ABNORMAL HIGH (ref 98–192)

## 2019-10-18 NOTE — Telephone Encounter (Signed)
Notified by lab/Marie @ 1255: Hgb 10.2. Per Dr. Irene Limbo verbal order: Transfuse only if hgb <6.5. Transfusion of 1 unit PRBCs originally scheduled on 10/19/19 cancelled.  Called patient and LVM that no transfusion needed - appt for 10/19/19 was cancelled due to hgb 10.2.

## 2019-10-19 ENCOUNTER — Inpatient Hospital Stay: Payer: Medicare Other

## 2019-10-24 ENCOUNTER — Inpatient Hospital Stay (HOSPITAL_COMMUNITY)
Admission: EM | Admit: 2019-10-24 | Discharge: 2019-10-29 | DRG: 177 | Disposition: A | Discharge status: Home / Self Care | Payer: Medicare Other | Attending: Emergency Medicine | Admitting: Emergency Medicine

## 2019-10-24 ENCOUNTER — Other Ambulatory Visit: Payer: Self-pay

## 2019-10-24 ENCOUNTER — Emergency Department (HOSPITAL_COMMUNITY): Payer: Medicare Other

## 2019-10-24 ENCOUNTER — Inpatient Hospital Stay: Payer: Medicare Other | Attending: Hematology

## 2019-10-24 ENCOUNTER — Other Ambulatory Visit: Payer: Self-pay | Admitting: *Deleted

## 2019-10-24 ENCOUNTER — Inpatient Hospital Stay (HOSPITAL_BASED_OUTPATIENT_CLINIC_OR_DEPARTMENT_OTHER): Payer: Medicare Other | Admitting: Hematology

## 2019-10-24 ENCOUNTER — Encounter (HOSPITAL_COMMUNITY): Payer: Self-pay

## 2019-10-24 ENCOUNTER — Telehealth: Payer: Self-pay | Admitting: *Deleted

## 2019-10-24 VITALS — BP 94/78 | HR 130 | Temp 97.8°F | Resp 18 | Ht 66.0 in | Wt 165.4 lb

## 2019-10-24 DIAGNOSIS — I4891 Unspecified atrial fibrillation: Secondary | ICD-10-CM | POA: Diagnosis present

## 2019-10-24 DIAGNOSIS — I10 Essential (primary) hypertension: Secondary | ICD-10-CM | POA: Diagnosis present

## 2019-10-24 DIAGNOSIS — D5911 Warm autoimmune hemolytic anemia: Secondary | ICD-10-CM | POA: Diagnosis not present

## 2019-10-24 DIAGNOSIS — J9601 Acute respiratory failure with hypoxia: Secondary | ICD-10-CM | POA: Diagnosis present

## 2019-10-24 DIAGNOSIS — Z825 Family history of asthma and other chronic lower respiratory diseases: Secondary | ICD-10-CM | POA: Diagnosis not present

## 2019-10-24 DIAGNOSIS — E785 Hyperlipidemia, unspecified: Secondary | ICD-10-CM | POA: Diagnosis present

## 2019-10-24 DIAGNOSIS — Z8349 Family history of other endocrine, nutritional and metabolic diseases: Secondary | ICD-10-CM

## 2019-10-24 DIAGNOSIS — Z7901 Long term (current) use of anticoagulants: Secondary | ICD-10-CM

## 2019-10-24 DIAGNOSIS — D469 Myelodysplastic syndrome, unspecified: Secondary | ICD-10-CM | POA: Diagnosis present

## 2019-10-24 DIAGNOSIS — Z66 Do not resuscitate: Secondary | ICD-10-CM | POA: Diagnosis present

## 2019-10-24 DIAGNOSIS — Z833 Family history of diabetes mellitus: Secondary | ICD-10-CM | POA: Diagnosis not present

## 2019-10-24 DIAGNOSIS — N179 Acute kidney failure, unspecified: Secondary | ICD-10-CM | POA: Diagnosis present

## 2019-10-24 DIAGNOSIS — E860 Dehydration: Secondary | ICD-10-CM | POA: Diagnosis present

## 2019-10-24 DIAGNOSIS — Z6827 Body mass index (BMI) 27.0-27.9, adult: Secondary | ICD-10-CM | POA: Diagnosis not present

## 2019-10-24 DIAGNOSIS — D649 Anemia, unspecified: Secondary | ICD-10-CM

## 2019-10-24 DIAGNOSIS — Z79899 Other long term (current) drug therapy: Secondary | ICD-10-CM

## 2019-10-24 DIAGNOSIS — Z87891 Personal history of nicotine dependence: Secondary | ICD-10-CM | POA: Diagnosis not present

## 2019-10-24 DIAGNOSIS — Z7289 Other problems related to lifestyle: Secondary | ICD-10-CM

## 2019-10-24 DIAGNOSIS — G8929 Other chronic pain: Secondary | ICD-10-CM | POA: Diagnosis present

## 2019-10-24 DIAGNOSIS — U071 COVID-19: Secondary | ICD-10-CM | POA: Diagnosis not present

## 2019-10-24 DIAGNOSIS — I4819 Other persistent atrial fibrillation: Secondary | ICD-10-CM | POA: Diagnosis present

## 2019-10-24 DIAGNOSIS — Z8249 Family history of ischemic heart disease and other diseases of the circulatory system: Secondary | ICD-10-CM

## 2019-10-24 DIAGNOSIS — R0602 Shortness of breath: Secondary | ICD-10-CM | POA: Diagnosis not present

## 2019-10-24 DIAGNOSIS — I2510 Atherosclerotic heart disease of native coronary artery without angina pectoris: Secondary | ICD-10-CM | POA: Diagnosis present

## 2019-10-24 DIAGNOSIS — J9600 Acute respiratory failure, unspecified whether with hypoxia or hypercapnia: Secondary | ICD-10-CM | POA: Diagnosis present

## 2019-10-24 DIAGNOSIS — J328 Other chronic sinusitis: Secondary | ICD-10-CM | POA: Diagnosis present

## 2019-10-24 DIAGNOSIS — Z8673 Personal history of transient ischemic attack (TIA), and cerebral infarction without residual deficits: Secondary | ICD-10-CM

## 2019-10-24 DIAGNOSIS — D461 Refractory anemia with ring sideroblasts: Secondary | ICD-10-CM | POA: Diagnosis not present

## 2019-10-24 DIAGNOSIS — E669 Obesity, unspecified: Secondary | ICD-10-CM | POA: Diagnosis present

## 2019-10-24 DIAGNOSIS — I959 Hypotension, unspecified: Secondary | ICD-10-CM | POA: Diagnosis present

## 2019-10-24 DIAGNOSIS — R269 Unspecified abnormalities of gait and mobility: Secondary | ICD-10-CM | POA: Diagnosis present

## 2019-10-24 DIAGNOSIS — R002 Palpitations: Secondary | ICD-10-CM | POA: Diagnosis not present

## 2019-10-24 DIAGNOSIS — Z888 Allergy status to other drugs, medicaments and biological substances status: Secondary | ICD-10-CM

## 2019-10-24 DIAGNOSIS — D638 Anemia in other chronic diseases classified elsewhere: Secondary | ICD-10-CM | POA: Diagnosis present

## 2019-10-24 DIAGNOSIS — Z882 Allergy status to sulfonamides status: Secondary | ICD-10-CM

## 2019-10-24 LAB — CMP (CANCER CENTER ONLY)
ALT: 32 U/L (ref 0–44)
AST: 50 U/L — ABNORMAL HIGH (ref 15–41)
Albumin: 3.6 g/dL (ref 3.5–5.0)
Alkaline Phosphatase: 41 U/L (ref 38–126)
Anion gap: 12 (ref 5–15)
BUN: 32 mg/dL — ABNORMAL HIGH (ref 8–23)
CO2: 24 mmol/L (ref 22–32)
Calcium: 8.4 mg/dL — ABNORMAL LOW (ref 8.9–10.3)
Chloride: 101 mmol/L (ref 98–111)
Creatinine: 1.33 mg/dL — ABNORMAL HIGH (ref 0.44–1.00)
GFR, Est AFR Am: 46 mL/min — ABNORMAL LOW (ref >60)
GFR, Estimated: 39 mL/min — ABNORMAL LOW (ref >60)
Glucose, Bld: 143 mg/dL — ABNORMAL HIGH (ref 70–99)
Potassium: 4 mmol/L (ref 3.5–5.1)
Sodium: 137 mmol/L (ref 135–145)
Total Bilirubin: 4.6 mg/dL (ref 0.3–1.2)
Total Protein: 7.3 g/dL (ref 6.5–8.1)

## 2019-10-24 LAB — RETICULOCYTES
Immature Retic Fract: 17.9 % — ABNORMAL HIGH (ref 2.3–15.9)
RBC.: 2.68 MIL/uL — ABNORMAL LOW (ref 3.87–5.11)
Retic Count, Absolute: 188.4 10*3/uL — ABNORMAL HIGH (ref 19.0–186.0)
Retic Ct Pct: 7 % — ABNORMAL HIGH (ref 0.4–3.1)

## 2019-10-24 LAB — CBC WITH DIFFERENTIAL (CANCER CENTER ONLY)
Abs Immature Granulocytes: 0.08 10*3/uL — ABNORMAL HIGH (ref 0.00–0.07)
Basophils Absolute: 0 10*3/uL (ref 0.0–0.1)
Basophils Relative: 0 %
Eosinophils Absolute: 0 10*3/uL (ref 0.0–0.5)
Eosinophils Relative: 0 %
HCT: 25.7 % — ABNORMAL LOW (ref 36.0–46.0)
Hemoglobin: 8.8 g/dL — ABNORMAL LOW (ref 12.0–15.0)
Immature Granulocytes: 1 %
Lymphocytes Relative: 14 %
Lymphs Abs: 0.9 10*3/uL (ref 0.7–4.0)
MCH: 32.6 pg (ref 26.0–34.0)
MCHC: 34.2 g/dL (ref 30.0–36.0)
MCV: 95.2 fL (ref 80.0–100.0)
Monocytes Absolute: 0.2 10*3/uL (ref 0.1–1.0)
Monocytes Relative: 3 %
Neutro Abs: 5.3 10*3/uL (ref 1.7–7.7)
Neutrophils Relative %: 82 %
Platelet Count: 311 10*3/uL (ref 150–400)
RBC: 2.7 MIL/uL — ABNORMAL LOW (ref 3.87–5.11)
RDW: 19.7 % — ABNORMAL HIGH (ref 11.5–15.5)
WBC Count: 6.5 10*3/uL (ref 4.0–10.5)
nRBC: 0.5 % — ABNORMAL HIGH (ref 0.0–0.2)

## 2019-10-24 LAB — LACTATE DEHYDROGENASE: LDH: 695 U/L — ABNORMAL HIGH (ref 98–192)

## 2019-10-24 LAB — TSH: TSH: 1.046 u[IU]/mL (ref 0.350–4.500)

## 2019-10-24 LAB — SAMPLE TO BLOOD BANK

## 2019-10-24 LAB — POC SARS CORONAVIRUS 2 AG -  ED: SARS Coronavirus 2 Ag: POSITIVE — AB

## 2019-10-24 LAB — TROPONIN I (HIGH SENSITIVITY)
Troponin I (High Sensitivity): 8 ng/L (ref <18)
Troponin I (High Sensitivity): 7 ng/L (ref <18)

## 2019-10-24 LAB — MAGNESIUM: Magnesium: 2.1 mg/dL (ref 1.7–2.4)

## 2019-10-24 MED ORDER — SODIUM CHLORIDE 0.9 % IV BOLUS
1000.0000 mL | Freq: Once | INTRAVENOUS | Status: AC
  Administered 2019-10-24: 17:00:00 1000 mL via INTRAVENOUS

## 2019-10-24 MED ORDER — DILTIAZEM HCL-DEXTROSE 125-5 MG/125ML-% IV SOLN (PREMIX)
5.0000 mg/h | INTRAVENOUS | Status: DC
  Administered 2019-10-24: 17:00:00 5 mg/h via INTRAVENOUS
  Filled 2019-10-24 (×2): qty 125

## 2019-10-24 MED ORDER — DOXYCYCLINE HYCLATE 100 MG PO TABS: 100.0000 mg | ORAL_TABLET | Freq: Two times a day (BID) | ORAL | 0 refills | Status: DC

## 2019-10-24 MED ORDER — SODIUM CHLORIDE (PF) 0.9 % IJ SOLN
INTRAMUSCULAR | Status: AC
  Filled 2019-10-24: qty 50

## 2019-10-24 NOTE — ED Provider Notes (Signed)
[facility] DEPT Provider Note   CSN: [id number] Arrival date & time: 10/24/19  1624     History Chief Complaint  Patient presents with   Weakness    Alexandra Pierce is a 74 y.o. female.  The history is provided by medical records. No language interpreter was used.  Shortness of Breath Severity:  Moderate Onset quality:  Gradual Timing:  Constant Progression:  Worsening Chronicity:  New Context: activity   Relieved by:  Nothing Worsened by:  Exertion Ineffective treatments:  None tried Associated symptoms: cough and sputum production   Associated symptoms: no abdominal pain, no chest pain, no diaphoresis, no fever, no headaches, no neck pain, no syncope, no vomiting and no wheezing        Past Medical History:  Diagnosis Date   Chronic lower back pain    Hypercholesteremia    Hypertension    mild   Obesity    Paroxysmal atrial fibrillation ([place])    managed with anticoagulation and rate control.    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   Counseling regarding advance care planning and goals of care 08/16/2019   Elevated serum lactate dehydrogenase (LDH)    Renal lesion    Syncope 05/22/2018   Symptomatic anemia 05/22/2018   Unstable angina (HCC)    Chest pain 03/16/2016   ARF (acute renal failure) ([place]) 03/16/2016   Coronary artery disease involving native coronary artery of native heart with unstable angina pectoris ([place])    Acute CVA (cerebrovascular accident) ([place]) 01/12/2014   TIA (transient ischemic attack) 01/11/2014   COLONIC POLYPS 12/15/2007   Hyperlipidemia 12/15/2007   INTERNAL HEMORRHOIDS WITHOUT MENTION COMP 12/15/2007   SINUSITIS, CHRONIC 12/15/2007   CONSTIPATION  12/15/2007   RECTAL BLEEDING 12/15/2007   OSTEOARTHRITIS 12/15/2007   Essential hypertension 07/18/2007   Atrial fibrillation ([place]) 07/18/2007   History of cardiovascular disorder 07/18/2007    Past Surgical History:  Procedure Laterality Date   CARDIAC CATHETERIZATION  03/31/2000   EF 49%/LAD lg vessel which coursed to the apex & gave rise to 2 diagonal branches/ LAD noted to have 30% ostial lesion & tapers to a sm vessel toward the apex but no high grade lesion/1st diagonal med sized vessel & 2nd diagonal sm vessel with no significant disease/ lt ventriculogram reveals mild global hypokinesis w/ an estimated EF of 45-50%   CARDIAC CATHETERIZATION N/A 03/17/2016   Procedure: Left Heart Cath and Coronary Angiography;  Surgeon: Wellington Hampshire, MD;  Location: [facility];  Service: Cardiovascular;  Laterality: N/A;   LEFT HEART CATH AND CORONARY ANGIOGRAPHY N/A 03/31/2018   Procedure: LEFT HEART CATH AND CORONARY ANGIOGRAPHY;  Surgeon: Martinique, Peter M, MD;  Location: [facility];  Service: Cardiovascular;  Laterality: N/A;     OB History   No obstetric history on file.     Family History  Problem Relation Age of Onset   Heart disease Father    Asthma Father    Diabetes Father    Heart disease Mother    Heart disease Brother    Hyperlipidemia Sister    Hyperlipidemia Brother    Hyperlipidemia Brother    Cancer Neg Hx     Social History   Tobacco Use  Smoking status: Former Smoker    Packs/day: 0.12    Years: 15.00    Pack years: 1.80    Types: Cigarettes    Quit date: 10/18/1978    Years since quitting: 41.0   Smokeless tobacco: Never Used  Substance Use Topics   Alcohol use: Yes    Alcohol/week: 14.0 standard drinks    Types: 14 Glasses of wine per week   Drug use: No    Home Medications Prior to Admission medications   Medication Sig Start Date End Date Taking? Authorizing Provider  Ascorbic Acid (VITAMIN C PO) Take 1 tablet by  mouth daily.    [provider]  b complex vitamins capsule Take 1 capsule by mouth daily.     [provider]  benzonatate (TESSALON) 100 MG capsule Take 1 capsule (100 mg total) by mouth every 8 (eight) hours as needed for cough. 10/03/19   Corena Herter, PA-C  cholecalciferol (VITAMIN D) 1000 UNITS tablet Take 1,000 Units by mouth daily.    [provider]  cyanocobalamin 1000 MCG tablet Take 1,000 mcg by mouth daily.     [provider]  doxycycline (VIBRA-TABS) 100 MG tablet Take 1 tablet (100 mg total) by mouth 2 (two) times daily. 10/24/19   Brunetta Genera, MD  esomeprazole (NEXIUM) 40 MG capsule Take 1 capsule (40 mg total) by mouth daily. 10/13/19 11/12/19  Alma Friendly, MD  fenofibrate 160 MG tablet Take 1 tablet (160 mg total) by mouth daily. 02/16/19   Burtis Junes, NP  fluticasone (FLONASE) 50 MCG/ACT nasal spray Place 1 spray into both nostrils daily.    [provider]  folic acid (FOLVITE) 1 MG tablet Take 5 tablets (5 mg total) by mouth daily. 10/13/19   Alma Friendly, MD  metoprolol tartrate (LOPRESSOR) 25 MG tablet Take 0.5 tablets (12.5 mg total) by mouth 2 (two) times daily. Patient taking differently: Take 25 mg by mouth daily.  04/04/19   Burtis Junes, NP  predniSONE (DELTASONE) 10 MG tablet Take 8 tabs daily for 4 days, then 7 tabs daily for 7 days, then 6 tabs daily for 7 days, then 5 tabs daily for 7 days, then 4 tabs daily till complete 10/13/19   Alma Friendly, MD  XARELTO 20 MG TABS tablet TAKE 1 TABLET BY MOUTH ONCE DAILY Patient taking differently: Take 20 mg by mouth daily.  04/18/19   Martinique, Peter M, MD    Allergies    Sulfonamide derivatives, Lipitor [atorvastatin], Pravachol [pravastatin sodium], and Statins  Review of Systems   Review of Systems  Constitutional: Positive for chills and fatigue. Negative for diaphoresis and fever.  HENT: Positive for congestion.   Respiratory:  Positive for cough, sputum production and shortness of breath. Negative for chest tightness, wheezing and stridor.   Cardiovascular: Negative for chest pain and syncope.  Gastrointestinal: Positive for diarrhea and nausea. Negative for abdominal distention, abdominal pain and vomiting.  Genitourinary: Positive for difficulty urinating. Negative for flank pain and frequency.  Musculoskeletal: Negative for back pain, neck pain and neck stiffness.  Neurological: Positive for light-headedness. Negative for headaches.  Psychiatric/Behavioral: Negative for agitation.  All other systems reviewed and are negative.   Physical Exam Updated Vital Signs BP 97/64 (BP Location: Left Arm)    Pulse (!) 116    Temp 98.5 F (36.9 C) (Oral)    Resp 15    SpO2 100%   Physical Exam Vitals and nursing note reviewed.  Constitutional:      General: She is not in acute distress.    Appearance: She is well-developed. She is not ill-appearing, toxic-appearing or diaphoretic.  HENT:     Head: Normocephalic and atraumatic.     Right Ear: External ear normal.     Left Ear: External ear normal.     Nose: Congestion present. No rhinorrhea.     Mouth/Throat:     Pharynx: No oropharyngeal exudate.  Eyes:     Conjunctiva/sclera: Conjunctivae normal.     Pupils: Pupils are equal, round, and reactive to light.  Cardiovascular:     Rate and Rhythm: Tachycardia present. Rhythm irregular.     Pulses: Normal pulses.     Heart sounds: No murmur.  Pulmonary:     Effort: Pulmonary effort is normal. Tachypnea present. No respiratory distress.     Breath sounds: No stridor. No wheezing, rhonchi or rales.  Chest:     Chest wall: No tenderness.  Abdominal:     General: Abdomen is flat. There is no distension.     Tenderness: There is no abdominal tenderness. There is no right CVA tenderness, left CVA tenderness or rebound.  Musculoskeletal:        General: No tenderness.     Cervical back: Normal range of motion and  neck supple.  Skin:    General: Skin is warm.     Capillary Refill: Capillary refill takes less than 2 seconds.     Findings: No erythema or rash.  Neurological:     General: No focal deficit present.     Mental Status: She is alert and oriented to person, place, and time.     Motor: No abnormal muscle tone.     Coordination: Coordination normal.     Deep Tendon Reflexes: Reflexes are normal and symmetric.  Psychiatric:        Mood and Affect: Mood normal.     ED Results / Procedures / Treatments   Labs (all labs ordered are listed, but only abnormal results are displayed) Labs Reviewed  POC SARS CORONAVIRUS 2 AG -  ED - Abnormal; Notable for the following components:      Result Value   SARS Coronavirus 2 Ag POSITIVE (*)    All other components within normal limits  URINE CULTURE  MAGNESIUM  TSH  URINALYSIS, ROUTINE W REFLEX MICROSCOPIC  TROPONIN I (HIGH SENSITIVITY)  TROPONIN I (HIGH SENSITIVITY)    EKG EKG Interpretation  Date/Time:  Wednesday October 24 2019 16:40:12 EST Ventricular Rate:  124 PR Interval:    QRS Duration: 93 QT Interval:  320 QTC Calculation: 460 R Axis:   28 Text Interpretation: Atrial fibrillation Borderline repolarization abnormality When comapred to prior, now afib with RVR. No STEMI Confirmed by Antony Blackbird [phone]) on 10/24/2019 11:14:39 PM   Radiology DG Chest Portable 1 View  Result Date: 10/24/2019 CLINICAL DATA:  Cough. EXAM: PORTABLE CHEST 1 VIEW COMPARISON:  October 10, 2019 FINDINGS: Mildly decreased lung volumes are seen likely secondary to suboptimal patient inspiration. There is no evidence of acute infiltrate, pleural effusion or pneumothorax. The heart size and mediastinal contours are within normal limits. Degenerative changes seen throughout the thoracic spine. IMPRESSION: No active disease. Electronically Signed   By: Virgina Norfolk M.D.   On: 10/24/2019 17:36    Procedures Procedures (including critical care  time)  CRITICAL CARE Performed by: Gwenyth Allegra Bhumi Godbey Total critical care time: 35 minutes Critical care time was exclusive of separately billable procedures and  treating other patients. Critical care was necessary to treat or prevent imminent or life-threatening deterioration. Critical care was time spent personally by me on the following activities: development of treatment plan with patient and/or surrogate as well as nursing, discussions with consultants, evaluation of patient's response to treatment, examination of patient, obtaining history from patient or surrogate, ordering and performing treatments and interventions, ordering and review of laboratory studies, ordering and review of radiographic studies, pulse oximetry and re-evaluation of patient's condition.   Medications Ordered in ED Medications  diltiazem (CARDIZEM) 125 mg in dextrose 5% 125 mL (1 mg/mL) infusion (5 mg/hr Intravenous Rate/Dose Verify 10/24/19 1808)  sodium chloride (PF) 0.9 % injection (has no administration in time range)  sodium chloride 0.9 % bolus 1,000 mL (0 mLs Intravenous Stopped 10/24/19 2024)    ED Course  I have reviewed the triage vital signs and the nursing notes.  Pertinent labs & imaging results that were available during my care of the patient were reviewed by me and considered in my medical decision making (see chart for details).    MDM Rules/Calculators/A&P                      JERRIYAH SOREY is a 74 y.o. female with a past medical history significant for atrial fibrillation on Xarelto, hypertension, hyperlipidemia, CAD, prior stroke, prior symptomatic anemia, mild dysplastic syndrome, and osteoarthritis who presents at the direction of her oncology team for fatigue, shortness of breath, malaise, cough, diarrhea, and lightheadedness.  Patient reports that she went to see her oncologist today for worsening fatigue, malaise, productive cough, shortness of breath.  She reports it is worse when she  tries to ambulate or exert herself.  She also feels very lightheaded when she sits up or stands up.  She knows that she is in A. fib at this time and takes blood thinners however she reports she is been having palpitations.  She denies current chest pain but reports extreme shortness of breath.  She does have a cough and is coughing up white sputum.  She denies any fevers or chills at home and denies any known sick contacts.  She does report some diarrhea but denies nausea or vomiting.  She denies any trauma.  She feels near syncopal when she tries to stand up.  On arrival, patient's heart rate is in the 140s with A. fib with RVR and her blood pressure is less than [id number] systolic.  Patient went to oncologist today where she had some blood work done and was sent to the emergency department for her shortness of breath and other symptoms.  EKG shows A. fib with RVR with a rate of 124.  I saw the patient's heart rate in the 140s when I was first admitted.  No STEMI seen.  Clinical aspect patient may have multiple cause of her symptoms however A. fib with RVR is likely contributing to the near syncope, lightheadedness, and hypotension.  We will give some fluids for rehydration given his recent diarrhea and will start the patient on diltiazem drip.  Will check for electrolyte imbalance and occult infection given the cough, malaise, diarrhea.  We will get a rapid Covid test.  If rapid Covid is negative, will send a 2-hour test.  As she is on Xarelto, does not have chest pain, and is in A. fib with RVR, have a low suspicion for thromboembolic etiology at this time.  She denies any leg pain or leg swelling.  We will  attempt to rehydrate and give medications and hold on D-dimer and CT scan at first.   Given the patient's hypotension, shortness of breath, lightheadedness, and A. fib with RVR, anticipate admission for further management.  11:11 PM Patient's work-up is returned and she does have COVID-19 infection.  Her  troponin was negative.  Magnesium was normal.  TSH not elevated.  Patient CBC and CMP were performed at the cancer center just before coming to the emergency department.  Chest x-ray does not show pneumonia.  Patient reports he is unable to produce urine for [place] after several hours.  Patient is still having soft blood pressures in the 0000000 systolic.  She is on diltiazem drip for the A. fib with RVR with rates up to 140 on arrival.  Is now just over 100.  She is still feeling fatigued and malaise.  She is on oxygen now for an O2 sat of around 92%.  Although patient has not yet had urinalysis completed, we will admit for COVID-19 likely leading to dehydration with the diarrhea and her RVR with her A. fib.  Hospitalist team called for admission.   Final Clinical Impression(s) / ED Diagnoses Final diagnoses:  COVID-19  Atrial fibrillation with RVR (HCC)  Dehydration     Clinical Impression: 1. COVID-19   2. Atrial fibrillation with RVR (HCC)   3. Dehydration     Disposition: Admit  This note was prepared with assistance of Dragon voice recognition software. Occasional wrong-word or sound-a-like substitutions may have occurred due to the inherent limitations of voice recognition software.      Kaydin Labo, Gwenyth Allegra, MD 10/24/19 [phone]

## 2019-10-24 NOTE — Progress Notes (Signed)
HEMATOLOGY/ONCOLOGY CLINIC NOTE  Date of Service: 10/24/2019  Patient Care Team: Chesley Noon, MD as PCP - General (Family Medicine) Burtis Junes, NP as PCP - Cardiology (Nurse Practitioner)  CHIEF COMPLAINTS/PURPOSE OF CONSULTATION:  mx of MDS/MPN Toxicity check for Rituxan  HISTORY OF PRESENTING ILLNESS:   Alexandra Pierce is a wonderful 74 y.o. female who has been referred to [place] by Dr. Eliseo Squires for evaluation and management of symptomatic anemia.   Patient has history of hypertension, dyslipidemia, atrial fibrillation on anticoagulation with previous history of CVA x2, obesity who presented with generalized weakness.  Prior to admission she had an episode of blurring of vision especially in the right eye associated with a spell of confusion and possible loss of consciousness for a few seconds associated with disorientation.  She notes that she has also been feeling progressively fatigued for a few weeks and has had difficulty with dyspnea on exertion with short distances. Her daughters insisted she get evaluated and so she came to the emergency room for further evaluation. She does follow-up with cardiology and is being evaluated by pulmonary at the end of the month as well.  Patient previously has been on Coumadin from 2000 but was switched to Xarelto several years ago.  She notes no overt evidence of black stools or bleeding in the stools, no hematuria no nosebleeds no gum bleeds. Notes that she has been generally eating okay. Does endorse new night sweats over the last month or 2. No abdominal pain or significant change in bowel habits.  In the emergency room the patient had labs which showed hemoglobin of 6.6.5 with an MCV of 96.5.  Normal WBC count of 7.2k with elevated platelets of 685k which on repeat were down to 573k. Reticulocyte counts were relatively suppressed. LDH was elevated to 517 but haptoglobin was within normal limits at 45. Coombs negative Myeloma panel  showed no M spike Ferritin level was elevated to 845 suggestive of acute phase reaction/inflammation. B12 and folate within normal limits.  Patient received 2 units of PRBCs with an appropriate bump in her hemoglobin level and felt much better. CT chest abdomen pelvis was done which showed mild splenomegaly without a focal splenic lesion.  Multiple hepatic cysts.  Abnormal hypoenhancement in the right kidney lower pole of undetermined etiology -broad differential based on radiology input. right perirenal stranding is associated with a small amount of fluid in the adjacent right paracolic gutter, and a small fluid collection along the inferior margin of the liver which could be an exophytic cyst, small complex fluid collection, or conceivably a tumor nodule.  No other significant other lymphadenopathy noted.  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 The patient's last visit with [place] was on 09/20/2019. The pt reports that she is doing well overall.  The pt reports that she was feeling well for about a week after her hospital discharge. She then began to feel extremely tired and had a productive cough and rhinitis. Her phlegm has been clear in color. Pt has noticed herself going in and out of Afib during this time. Pt was around her daughter who also had a cough. She denies any fevers or chills.  She has not been eating well due to being too tired to cook and a lack of appetite. She is currently eating at about half of her baseline. Pt does not have assistance at home. The fatigue and weakness make it difficult for her to do things by herself.    Pt has a sulfur allergy  and her throat has closed when she's been exposed to it previously. She denies any history of neuropathy.   Lab results today (10/24/19) of CBC w/diff and CMP is as follows: all values are WNL except for RBC at 2.70, Hgb at 8.8, HCT at 25.7, RDW at 19.7, nRBC Rel at 0.5, Abs Immature Granulocytes at 0.08K, Glucose at 143, BUN at 32, Creatinine at 1.33, Calcium at 8.4, AST at 50, Total Bilirubin at 4.6, GFR Est Non Af Am at 39. 10/24/2019 LDH at 695 10/24/2019 Reticulocytes is as follows: Retic Ct Pct at 7.0, RBC at 2.68, Retic Count Abs at 188.4, Immature Retic Fract at 17.9  On review of systems, pt reports productive cough, rhinitis, low appetite, fatigue, generalized weakness and denies fevers, chills, leg swelling, abdominal pain, nausea and any other symptoms.    Past Medical History:  Diagnosis Date  . Chronic lower back pain   . Hypercholesteremia   . Hypertension    mild  . Obesity   . Paroxysmal atrial fibrillation (HCC)    managed with anticoagulation and rate control.   . Stroke [facility]) 2001 & 2015   "right side gets weak sometimes if I'm only tired" (03/16/2016)    SURGICAL HISTORY: Past Surgical History:  Procedure Laterality Date  . CARDIAC CATHETERIZATION  03/31/2000   EF 49%/LAD lg vessel which coursed to the apex & gave rise to 2 diagonal branches/ LAD noted to have 30% ostial lesion & tapers to a sm vessel toward the apex but no high grade lesion/1st diagonal med sized vessel & 2nd diagonal sm vessel with no significant disease/ lt ventriculogram reveals mild global hypokinesis w/ an estimated EF of 45-50%  . CARDIAC CATHETERIZATION N/A 03/17/2016   Procedure: Left Heart Cath and Coronary Angiography;  Surgeon: Wellington Hampshire, MD;  Location: [facility];  Service: Cardiovascular;  Laterality: N/A;  . LEFT HEART CATH AND CORONARY ANGIOGRAPHY N/A 03/31/2018   Procedure: LEFT HEART CATH AND CORONARY ANGIOGRAPHY;  Surgeon: Martinique, Peter M, MD;  Location: [facility];  Service: Cardiovascular;  Laterality: N/A;    SOCIAL HISTORY: Social History   Socioeconomic History  . Marital status: Married    Spouse name: Not on file  . Number of children: 2  . Years of education: Not on file  . Highest education level: Not on file  Occupational History  . Occupation: Herbalist  Tobacco Use  . Smoking status: Former Smoker    Packs/day: 0.12    Years: 15.00    Pack years: 1.80    Types: Cigarettes    Quit date: 10/18/1978    Years since quitting: 41.0  . Smokeless tobacco: Never Used  Substance and Sexual Activity  . Alcohol use: Yes    Alcohol/week: 14.0 standard drinks    Types: 14 Glasses of wine per week  . Drug use: No  . Sexual activity: Yes  Other Topics Concern  . Not on file  Social History Narrative  . Not on file   Social Determinants of Health   Financial Resource Strain:   . Difficulty of Paying Living Expenses: Not on file  Food Insecurity:   .  Worried About Charity fundraiser in the Last Year: Not on file  . Ran Out of Food in the Last Year: Not on file  Transportation Needs:   . Lack of Transportation (Medical): Not on file  . Lack of Transportation (Non-Medical): Not on file  Physical Activity:   . Days of Exercise per Week: Not on file  . Minutes of Exercise per Session: Not on file  Stress:   . Feeling of Stress : Not on file  Social Connections:   . Frequency of Communication with Friends and Family: Not on file  . Frequency of Social Gatherings with Friends and Family: Not on file  . Attends Religious Services: Not on file  . Active Member of Clubs or Organizations: Not on file  . Attends Archivist Meetings: Not on file  . Marital Status: Not on file  Intimate Partner Violence:   . Fear of Current or Ex-Partner: Not on file  . Emotionally Abused: Not on file  . Physically Abused: Not on file  . Sexually Abused: Not on file    FAMILY HISTORY: Family History  Problem Relation Age of Onset   . Heart disease Father   . Asthma Father   . Diabetes Father   . Heart disease Mother   . Heart disease Brother   . Hyperlipidemia Sister   . Hyperlipidemia Brother   . Hyperlipidemia Brother   . Cancer Neg Hx     ALLERGIES:  is allergic to sulfonamide derivatives; lipitor [atorvastatin]; pravachol [pravastatin sodium]; and statins.  MEDICATIONS:  Current Outpatient Medications  Medication Sig Dispense Refill  . Ascorbic Acid (VITAMIN C PO) Take 1 tablet by mouth daily.    [name] Kitchen b complex vitamins capsule Take 1 capsule by mouth daily.     . benzonatate (TESSALON) 100 MG capsule Take 1 capsule (100 mg total) by mouth every 8 (eight) hours as needed for cough. 21 capsule 0  . cholecalciferol (VITAMIN D) 1000 UNITS tablet Take 1,000 Units by mouth daily.    . cyanocobalamin 1000 MCG tablet Take 1,000 mcg by mouth daily.     [name] Kitchen doxycycline (VIBRA-TABS) 100 MG tablet Take 1 tablet (100 mg total) by mouth 2 (two) times daily. 20 tablet 0  . esomeprazole (NEXIUM) 40 MG capsule Take 1 capsule (40 mg total) by mouth daily. 30 capsule 0  . fenofibrate 160 MG tablet Take 1 tablet (160 mg total) by mouth daily. 90 tablet 3  . fluticasone (FLONASE) 50 MCG/ACT nasal spray Place 1 spray into both nostrils daily.    . folic acid (FOLVITE) 1 MG tablet Take 5 tablets (5 mg total) by mouth daily. 60 tablet 22  . metoprolol tartrate (LOPRESSOR) 25 MG tablet Take 0.5 tablets (12.5 mg total) by mouth 2 (two) times daily. (Patient taking differently: Take 25 mg by mouth daily. ) 90 tablet 3  . predniSONE (DELTASONE) 10 MG tablet Take 8 tabs daily for 4 days, then 7 tabs daily for 7 days, then 6 tabs daily for 7 days, then 5 tabs daily for 7 days, then 4 tabs daily till complete 178 tablet 0  . XARELTO 20 MG TABS tablet TAKE 1 TABLET BY MOUTH ONCE DAILY (Patient taking differently: Take 20 mg by mouth daily. ) 30 tablet 5   No current facility-administered medications for this visit.    REVIEW OF SYSTEMS:     A 10+ POINT REVIEW OF SYSTEMS WAS OBTAINED including neurology, dermatology, psychiatry, cardiac, respiratory, lymph, extremities, GI, GU,  Musculoskeletal, constitutional, breasts, reproductive, HEENT.  All pertinent positives are noted in the HPI.  All others are negative.   PHYSICAL EXAMINATION: ECOG FS:1 - Symptomatic but completely ambulatory  Vitals:   10/24/19 1520  BP: 94/78  Pulse: (!) 130  Resp: 18  Temp: 97.8 F (36.6 C)  SpO2: 99%   Wt Readings from Last 3 Encounters:  10/24/19 165 lb 6.4 oz (75 kg)  10/10/19 (S) 177 lb (80.3 kg)  09/20/19 179 lb 8 oz (81.4 kg)   Body mass index is 26.7 kg/m.    GENERAL:alert, in no acute distress and comfortable SKIN: no acute rashes, no significant lesions EYES: conjunctiva are pink and non-injected, sclera anicteric OROPHARYNX: MMM, no exudates, no oropharyngeal erythema or ulceration NECK: supple, no JVD LYMPH:  no palpable lymphadenopathy in the cervical, axillary or inguinal regions LUNGS: normal respiratory effort, some rales b/l at bases  HEART: regular rate & rhythm ABDOMEN:  normoactive bowel sounds , non tender, not distended. No palpable hepatosplenomegaly.  Extremity: no pedal edema PSYCH: alert & oriented x 3 with fluent speech NEURO: no focal motor/sensory deficits  LABORATORY DATA:  I have reviewed the data as listed  . CBC Latest Ref Rng & Units 10/24/2019 10/18/2019 10/13/2019  WBC 4.0 - 10.5 K/uL 6.5 6.2 5.7  Hemoglobin 12.0 - 15.0 g/dL 8.8(L) 10.2(L) 8.1(L)  Hematocrit 36.0 - 46.0 % 25.7(L) 31.0(L) 26.3(L)  Platelets 150 - 400 K/uL 311 309 324    . CMP Latest Ref Rng & Units 10/24/2019 10/18/2019 10/13/2019  Glucose 70 - 99 mg/dL 143(H) 112(H) 142(H)  BUN 8 - 23 mg/dL 32(H) 23 28(H)  Creatinine 0.44 - 1.00 mg/dL 1.33(H) 0.96 0.90  Sodium 135 - 145 mmol/L 137 138 138  Potassium 3.5 - 5.1 mmol/L 4.0 3.2(L) 4.2  Chloride 98 - 111 mmol/L 101 103 107  CO2 22 - 32 mmol/L 24 27 23   Calcium 8.9 - 10.3 mg/dL  8.4(L) 8.4(L) 8.7(L)  Total Protein 6.5 - 8.1 g/dL 7.3 7.7 8.7(H)  Total Bilirubin 0.3 - 1.2 mg/dL 4.6(HH) 2.4(H) 1.6(H)  Alkaline Phos 38 - 126 U/L 41 40 32(L)  AST 15 - 41 U/L 50(H) 18 11(L)  ALT 0 - 44 U/L 32 24 18   05/31/18 Molecular Pathology:   05/24/18 Cytogenetics:     05/24/18 BM Bx:   05/24/18 BM Flow cytometry:     RADIOGRAPHIC STUDIES: I have personally reviewed the radiological images as listed and agreed with the findings in the report. DG Chest Port 1 View  Result Date: 10/10/2019 CLINICAL DATA:  Shortness of breath for 1 week, atrial fibrillation, persistent dry cough, COVID-19 negative on 10/03/2019 EXAM: PORTABLE CHEST 1 VIEW COMPARISON:  Portable exam 1200 hours compared to 10/03/2019 FINDINGS: Normal heart size, mediastinal contours, and pulmonary vascularity. Lungs clear. No pleural effusion or pneumothorax. Bones appear demineralized. IMPRESSION: No acute abnormalities. Electronically Signed   By: Lavonia Dana M.D.   On: 10/10/2019 12:20   DG Chest Portable 1 View  Result Date: 10/03/2019 CLINICAL DATA:  Cough, shortness of breath EXAM: PORTABLE CHEST 1 VIEW COMPARISON:  04/11/2018 FINDINGS: Heart and mediastinal contours are within normal limits. No focal opacities or effusions. No acute bony abnormality. IMPRESSION: No active disease. Electronically Signed   By: Rolm Baptise M.D.   On: 10/03/2019 17:44   ECHOCARDIOGRAM COMPLETE  Result Date: 10/11/2019   ECHOCARDIOGRAM REPORT   Patient Name:   Alexandra Pierce Date of Exam: 10/11/2019 Medical Rec #:  [id number]  Height:       66.0 in Accession #:    [id number]   Weight:       177.0 lb Date of Birth:  [birth year]     BSA:          1.90 m Patient Age:    36 years     BP:           108/73 mmHg Patient Gender: F            HR:           111 bpm. Exam Location:  Inpatient Procedure: 2D Echo Indications:    dyspnea 786.09  History:        Patient has prior history of Echocardiogram examinations, most                 recent  03/17/2016. Stroke; Risk Factors:Dyslipidemia and                 Hypertension. Paroxysmal a-fib.  Sonographer:    Jannett Celestine RDCS (AE) Referring Phys: [id number] [place]  1. Left ventricular ejection fraction, by visual estimation, is 60 to 65%. The left ventricle has normal function. There is no left ventricular hypertrophy.  2. Left ventricular diastolic function could not be evaluated.  3. The left ventricle has no regional wall motion abnormalities.  4. Global right ventricle has normal systolic function.The right ventricular size is normal. No increase in right ventricular wall thickness.  5. Left atrial size was severely dilated.  6. Right atrial size was normal.  7. Trivial pericardial effusion is present.  8. The mitral valve is normal in structure. Trivial mitral valve regurgitation. No evidence of mitral stenosis.  9. The tricuspid valve is normal in structure. 10. The aortic valve is normal in structure. Aortic valve regurgitation is not visualized. Mild aortic valve sclerosis without stenosis. 11. The pulmonic valve was grossly normal. Pulmonic valve regurgitation is not visualized. 12. TR signal is inadequate for assessing pulmonary artery systolic pressure. 13. The inferior vena cava is dilated in size with <50% respiratory variability, suggesting right atrial pressure of 15 mmHg. FINDINGS  Left Ventricle: Left ventricular ejection fraction, by visual estimation, is 60 to 65%. The left ventricle has normal function. The left ventricle has no regional wall motion abnormalities. The left ventricular internal cavity size was the left ventricle is normal in size. There is no left ventricular hypertrophy. The left ventricular diastology could not be evaluated due to atrial fibrillation. Left ventricular diastolic function could not be evaluated. Right Ventricle: The right ventricular size is normal. No increase in right ventricular wall thickness. Global RV systolic function is has  normal systolic function. Left Atrium: Left atrial size was severely dilated. Right Atrium: Right atrial size was normal in size Pericardium: Trivial pericardial effusion is present. Mitral Valve: The mitral valve is normal in structure. There is mild thickening of the mitral valve leaflet(s). Trivial mitral valve regurgitation. No evidence of mitral valve stenosis by observation. Tricuspid Valve: The tricuspid valve is normal in structure. Tricuspid valve regurgitation is trivial. Aortic Valve: The aortic valve is normal in structure. Aortic valve regurgitation is not visualized. Mild aortic valve sclerosis is present, with no evidence of aortic valve stenosis. Pulmonic Valve: The pulmonic valve was grossly normal. Pulmonic valve regurgitation is not visualized. Pulmonic regurgitation is not visualized. Aorta: The aortic root, ascending aorta and aortic arch are all structurally normal, with no evidence of dilitation or obstruction. Venous: The inferior vena cava  is dilated in size with less than 50% respiratory variability, suggesting right atrial pressure of 15 mmHg. A roughly spherical 5 cm hypoechoic lesion is seen in the left lobe of the liver, possibly a cyst. IAS/Shunts: No atrial level shunt detected by color flow Doppler. There is no evidence of a patent foramen ovale. No ventricular septal defect is seen or detected. There is no evidence of an atrial septal defect.  LEFT VENTRICLE PLAX 2D LVIDd:         4.60 cm LVIDs:         2.90 cm LV PW:         1.20 cm LV IVS:        1.00 cm LVOT diam:     1.90 cm LV SV:         65 ml LV SV Index:   33.32 LVOT Area:     2.84 cm  RIGHT VENTRICLE RV S prime:     16.00 cm/s TAPSE (M-mode): 1.3 cm LEFT ATRIUM             Index LA diam:        4.50 cm 2.37 cm/m LA Vol (A2C):   70.6 ml 37.18 ml/m LA Vol (A4C):   78.9 ml 41.56 ml/m LA Biplane Vol: 75.3 ml 39.66 ml/m  AORTIC VALVE LVOT Vmax:   91.40 cm/s LVOT Vmean:  59.600 cm/s LVOT VTI:    0.175 m  AORTA Ao Root diam:  3.00 cm MITRAL VALVE MV Area (PHT): 4.06 cm            SHUNTS MV PHT:        54.23 msec          Systemic VTI:  0.18 m MV Decel Time: 187 msec            Systemic Diam: 1.90 cm MV E velocity: 98.50 cm/s 103 cm/s  Mihai Croitoru MD Electronically signed by Sanda Klein MD Signature Date/Time: 10/11/2019/11:34:43 AM    Final     ASSESSMENT & PLAN:   74 y.o. female with  1. 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 No overt evidence of bleeding.  In the emergency room the patient had labs which showed hemoglobin of 6.6.5 with an MCV of 96.5.  Normal WBC count of 7.2k with elevated platelets of 685k which on repeat were down to 573k. Reticulocyte counts were relatively suppressed. LDH was elevated to 517 but haptoglobin was within normal limits at 45. Coombs negative Myeloma panel showed no M spike Ferritin level was elevated to 845 suggestive of acute phase reaction/inflammation. B12 and folate within normal limits.  05/24/18 BM Bx results which revealed erythroid and megakaryocytic proliferation and ring sideroblasts concerning for myeloproliferative/ myelodysplastic neoplasm   No excess blasts; Acute leukemia and lymphoma are not indicated   2. Rt renal radiographic abnormality as noted in CT with some fluid collection. 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 No fevers chills to suggest acute pyelonephritis or renal abscess. Urine analysis showed trace leuk esterase and 6-10 WBCs no significant hematuria. Broad differential diagnosis based on CT findings as per radiologist. Procalcitonin levels unimpressive  05/23/18 CT Chest Abdomen Pelvis was done which showed mild splenomegaly without a focal splenic lesion. Multiple hepatic cysts.  Abnormal hypoenhancement in the right kidney lower pole of undetermined etiology -broad differential based on radiology input. right perirenal stranding is associated with a  small amount of fluid in the adjacent right paracolic  gutter, and a small fluid collection along the inferior margin of the liver which could be an exophytic cyst, small complex fluid collection, or conceivably a tumor nodule. No other significant other lymphadenopathy noted.   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 Warm Antibody hemolytic Anemia  4. Atrial fibrillation with RVR  PLAN: -Discussed pt labwork today, 10/24/19; all values are WNL except for RBC at 2.70, Hgb at 8.8, HCT at 25.7, RDW at 19.7, nRBC Rel at 0.5, Abs Immature Granulocytes at 0.08K, Glucose at 143, BUN at 32, Creatinine at 1.33, Calcium at 8.4, AST at 50, Total Bilirubin at 4.6, GFR Est Non Af Am at 39. -Discussed 10/24/2019 LDH at 695 -Discussed 10/24/2019 Reticulocytes is as follows: Retic Ct Pct at 7.0, RBC at 2.68, Retic Count Abs at 188.4, Immature Retic Fract at 17.9 -Pt appears to be dehydrated and in Atrial Fibrillation -Will consider starting pt on Vincristine or Cellcept once respiratory symptoms and Afib has resolved -Will place pt on Doxycyline for bacterial infection prophylaxis -Recommended pt go to her appointment at [facility] on 01/11 for a second opinion -EKG shows afib with RVR -Admitted pt to [facility] for SOB and weakness due to Atrial Fibrillation with RVR and suspected COVID 19 infection likely from exposure to her daughter who was recently diagnosed. Patient daughter informed us about this after clinic visit   FOLLOW UP: Admitted to [facility] through ED.   The total time spent in the appt was 40 minutes and more than 50% was on counseling and direct patient cares and co-ordination of cares with ED  All of the patient's questions were answered with apparent satisfaction. The patient knows to call the clinic with any problems, questions or concerns.   Sullivan Lone MD [place] AAHIVMS [facility]  [facility] Hematology/Oncology Physician [facility]  (Office):       [phone] (Work cell):  [phone] (Fax):           [phone]  I, Yevette Edwards, am acting as a scribe for Dr. Sullivan Lone.   .I have reviewed the above documentation for accuracy and completeness, and I agree with the above. Brunetta Genera MD

## 2019-10-24 NOTE — Progress Notes (Signed)
Patient transported to [facility] ED on Dr. Grier Mitts order. Patient taken to RM 13 and assisted into bed. Report given to [facility], Therapist, sports. Contacted patient's daughter Lenna Sciara at patient's request to advise of transfer to ED.

## 2019-10-24 NOTE — Telephone Encounter (Signed)
Patient called - stated she is increasingly short of breath and feels bad. Asked when her next appt with Dr. Irene Limbo would be. Advised her lab/MD/transfuse blood appts scheduled for 10/26/19. Informed Dr. Irene Limbo of how patient was feeling. He asked for patient to come to [facility] today for lab appt. He will review labs and make decision about earlier transfusion. Contacted patient with this information. She verbalized understanding and will come today at 2 pm for labs - schedule message sent.

## 2019-10-24 NOTE — Progress Notes (Signed)
CRITICAL VALUE STICKER  CRITICAL VALUE: Bilirubin 4.6  RECEIVER (on-site recipient of call): Katheren Puller, RN    DATE & TIME NOTIFIED: 10/24/2019 @ 2:55pm.    MESSENGER (representative from lab): Anderson Malta  MD NOTIFIED: Dr. Ina Homes, RN   TIME OF NOTIFICATION: 2:56pm

## 2019-10-24 NOTE — ED Triage Notes (Signed)
Pt arrives from the cancer center. Pt had tachypnea and SHOB.  Pt is seen frequently for anemia and low hgb, so that was checked there which was WNL for pt.

## 2019-10-25 ENCOUNTER — Encounter (HOSPITAL_COMMUNITY): Payer: Self-pay | Admitting: Internal Medicine

## 2019-10-25 DIAGNOSIS — N179 Acute kidney failure, unspecified: Secondary | ICD-10-CM

## 2019-10-25 DIAGNOSIS — I4891 Unspecified atrial fibrillation: Secondary | ICD-10-CM

## 2019-10-25 DIAGNOSIS — U071 COVID-19: Principal | ICD-10-CM

## 2019-10-25 DIAGNOSIS — J9600 Acute respiratory failure, unspecified whether with hypoxia or hypercapnia: Secondary | ICD-10-CM

## 2019-10-25 LAB — URINALYSIS, ROUTINE W REFLEX MICROSCOPIC
Bilirubin Urine: NEGATIVE
Glucose, UA: 50 mg/dL — AB
Ketones, ur: NEGATIVE mg/dL
Nitrite: NEGATIVE
Protein, ur: NEGATIVE mg/dL
Specific Gravity, Urine: 1.017 (ref 1.005–1.030)
pH: 5 (ref 5.0–8.0)

## 2019-10-25 LAB — COMPREHENSIVE METABOLIC PANEL
ALT: 33 U/L (ref 0–44)
AST: 37 U/L (ref 15–41)
Albumin: 3.3 g/dL — ABNORMAL LOW (ref 3.5–5.0)
Alkaline Phosphatase: 28 U/L — ABNORMAL LOW (ref 38–126)
Anion gap: 8 (ref 5–15)
BUN: 35 mg/dL — ABNORMAL HIGH (ref 8–23)
CO2: 23 mmol/L (ref 22–32)
Calcium: 8.2 mg/dL — ABNORMAL LOW (ref 8.9–10.3)
Chloride: 105 mmol/L (ref 98–111)
Creatinine, Ser: 0.89 mg/dL (ref 0.44–1.00)
GFR calc Af Amer: >60 mL/min (ref >60)
GFR calc non Af Amer: >60 mL/min (ref >60)
Glucose, Bld: 130 mg/dL — ABNORMAL HIGH (ref 70–99)
Potassium: 4.7 mmol/L (ref 3.5–5.1)
Sodium: 136 mmol/L (ref 135–145)
Total Bilirubin: 2.6 mg/dL — ABNORMAL HIGH (ref 0.3–1.2)
Total Protein: 6.4 g/dL — ABNORMAL LOW (ref 6.5–8.1)

## 2019-10-25 LAB — CBC WITH DIFFERENTIAL/PLATELET
Abs Immature Granulocytes: 0.03 10*3/uL (ref 0.00–0.07)
Basophils Absolute: 0 10*3/uL (ref 0.0–0.1)
Basophils Relative: 0 %
Eosinophils Absolute: 0 10*3/uL (ref 0.0–0.5)
Eosinophils Relative: 0 %
HCT: 21.2 % — ABNORMAL LOW (ref 36.0–46.0)
Hemoglobin: 7 g/dL — ABNORMAL LOW (ref 12.0–15.0)
Immature Granulocytes: 1 %
Lymphocytes Relative: 26 %
Lymphs Abs: 0.8 10*3/uL (ref 0.7–4.0)
MCH: 32.3 pg (ref 26.0–34.0)
MCHC: 33 g/dL (ref 30.0–36.0)
MCV: 97.7 fL (ref 80.0–100.0)
Monocytes Absolute: 0.2 10*3/uL (ref 0.1–1.0)
Monocytes Relative: 5 %
Neutro Abs: 2.2 10*3/uL (ref 1.7–7.7)
Neutrophils Relative %: 68 %
Platelets: 240 10*3/uL (ref 150–400)
RBC: 2.17 MIL/uL — ABNORMAL LOW (ref 3.87–5.11)
RDW: 20 % — ABNORMAL HIGH (ref 11.5–15.5)
WBC: 3.1 10*3/uL — ABNORMAL LOW (ref 4.0–10.5)
nRBC: 0 % (ref 0.0–0.2)

## 2019-10-25 LAB — CBC
HCT: 24.2 % — ABNORMAL LOW (ref 36.0–46.0)
Hemoglobin: 7.9 g/dL — ABNORMAL LOW (ref 12.0–15.0)
MCH: 32.5 pg (ref 26.0–34.0)
MCHC: 32.6 g/dL (ref 30.0–36.0)
MCV: 99.6 fL (ref 80.0–100.0)
Platelets: 290 10*3/uL (ref 150–400)
RBC: 2.43 MIL/uL — ABNORMAL LOW (ref 3.87–5.11)
RDW: 20.5 % — ABNORMAL HIGH (ref 11.5–15.5)
WBC: 4.3 10*3/uL (ref 4.0–10.5)
nRBC: 0.5 % — ABNORMAL HIGH (ref 0.0–0.2)

## 2019-10-25 LAB — C-REACTIVE PROTEIN: CRP: 5.4 mg/dL — ABNORMAL HIGH (ref <1.0)

## 2019-10-25 LAB — MRSA PCR SCREENING: MRSA by PCR: NEGATIVE

## 2019-10-25 LAB — TROPONIN I (HIGH SENSITIVITY)
Troponin I (High Sensitivity): 5 ng/L (ref <18)
Troponin I (High Sensitivity): 5 ng/L (ref <18)

## 2019-10-25 LAB — ABO/RH: ABO/RH(D): A POS

## 2019-10-25 LAB — TSH: TSH: 0.496 u[IU]/mL (ref 0.350–4.500)

## 2019-10-25 LAB — FERRITIN: Ferritin: 2727 ng/mL — ABNORMAL HIGH (ref 11–307)

## 2019-10-25 MED ORDER — FENOFIBRATE 160 MG PO TABS
160.0000 mg | ORAL_TABLET | Freq: Every day | ORAL | Status: DC
  Administered 2019-10-25 – 2019-10-29 (×5): 160 mg via ORAL
  Filled 2019-10-25 (×5): qty 1

## 2019-10-25 MED ORDER — DEXAMETHASONE SODIUM PHOSPHATE 10 MG/ML IJ SOLN: 10.0000 mg | Freq: Every day | INTRAMUSCULAR | Status: DC

## 2019-10-25 MED ORDER — PREDNISONE 20 MG PO TABS: 50.0000 mg | ORAL_TABLET | Freq: Every day | ORAL | Status: DC

## 2019-10-25 MED ORDER — SODIUM CHLORIDE 0.9 % IV SOLN: INTRAVENOUS | Status: AC

## 2019-10-25 MED ORDER — SODIUM CHLORIDE 0.9 % IV SOLN
100.0000 mg | Freq: Every day | INTRAVENOUS | Status: AC
  Administered 2019-10-26 – 2019-10-29 (×4): 100 mg via INTRAVENOUS
  Filled 2019-10-25 (×4): qty 100

## 2019-10-25 MED ORDER — VITAMIN B-12 1000 MCG PO TABS
1000.0000 ug | ORAL_TABLET | Freq: Every day | ORAL | Status: DC
  Administered 2019-10-25 – 2019-10-29 (×5): 1000 ug via ORAL
  Filled 2019-10-25 (×5): qty 1

## 2019-10-25 MED ORDER — FOLIC ACID 1 MG PO TABS
5.0000 mg | ORAL_TABLET | Freq: Every day | ORAL | Status: DC
  Administered 2019-10-25 – 2019-10-29 (×5): 5 mg via ORAL
  Filled 2019-10-25 (×5): qty 5

## 2019-10-25 MED ORDER — SODIUM CHLORIDE 0.9 % IV SOLN
10.0000 mg | Freq: Every day | INTRAVENOUS | Status: DC
  Filled 2019-10-25: qty 1

## 2019-10-25 MED ORDER — ONDANSETRON HCL 4 MG/2ML IJ SOLN: 4.0000 mg | Freq: Four times a day (QID) | INTRAMUSCULAR | Status: DC | PRN

## 2019-10-25 MED ORDER — ONDANSETRON HCL 4 MG PO TABS: 4.0000 mg | ORAL_TABLET | Freq: Four times a day (QID) | ORAL | Status: DC | PRN

## 2019-10-25 MED ORDER — PREDNISONE 20 MG PO TABS
60.0000 mg | ORAL_TABLET | Freq: Every day | ORAL | Status: DC
  Administered 2019-10-26 – 2019-10-29 (×4): 60 mg via ORAL
  Filled 2019-10-25 (×4): qty 3

## 2019-10-25 MED ORDER — SODIUM CHLORIDE 0.9 % IV SOLN
200.0000 mg | Freq: Once | INTRAVENOUS | Status: AC
  Administered 2019-10-25: 06:00:00 200 mg via INTRAVENOUS
  Filled 2019-10-25: qty 200

## 2019-10-25 MED ORDER — ACETAMINOPHEN 325 MG PO TABS: 650.0000 mg | ORAL_TABLET | Freq: Four times a day (QID) | ORAL | Status: DC | PRN

## 2019-10-25 MED ORDER — BENZONATATE 100 MG PO CAPS
100.0000 mg | ORAL_CAPSULE | Freq: Three times a day (TID) | ORAL | Status: DC | PRN
  Administered 2019-10-25 – 2019-10-28 (×9): 100 mg via ORAL
  Filled 2019-10-25 (×9): qty 1

## 2019-10-25 MED ORDER — ACETAMINOPHEN 650 MG RE SUPP: 650.0000 mg | Freq: Four times a day (QID) | RECTAL | Status: DC | PRN

## 2019-10-25 MED ORDER — METOPROLOL TARTRATE 25 MG PO TABS
25.0000 mg | ORAL_TABLET | Freq: Two times a day (BID) | ORAL | Status: DC
  Administered 2019-10-25 – 2019-10-29 (×8): 25 mg via ORAL
  Filled 2019-10-25 (×9): qty 1

## 2019-10-25 MED ORDER — DEXAMETHASONE SODIUM PHOSPHATE 10 MG/ML IJ SOLN
6.0000 mg | Freq: Every day | INTRAMUSCULAR | Status: DC
  Administered 2019-10-25: 6 mg via INTRAVENOUS
  Filled 2019-10-25: qty 1

## 2019-10-25 MED ORDER — CHLORHEXIDINE GLUCONATE CLOTH 2 % EX PADS
6.0000 | MEDICATED_PAD | Freq: Every day | CUTANEOUS | Status: DC
  Administered 2019-10-25 – 2019-10-27 (×3): 6 via TOPICAL

## 2019-10-25 MED ORDER — RIVAROXABAN 15 MG PO TABS
15.0000 mg | ORAL_TABLET | Freq: Every day | ORAL | Status: DC
  Administered 2019-10-25 – 2019-10-26 (×2): 15 mg via ORAL
  Filled 2019-10-25 (×2): qty 1

## 2019-10-25 NOTE — Progress Notes (Signed)
Placed call to MD. Pt HBG results 7.9. Awaiting call back.

## 2019-10-25 NOTE — Progress Notes (Signed)
Received call back from MD. No new orders at this time. Continue to monitor.

## 2019-10-25 NOTE — Progress Notes (Signed)
PROGRESS NOTE  Alexandra Pierce  DOB: [birth year]  PCP: Chesley Noon, MD GF:[id number]  DOA: 10/24/2019 Admitted From: Home  LOS: 1 day   Chief Complaint  Patient presents with  . Weakness  . COVID   Brief narrative: Alexandra Pierce is a 74 y.o. female with PMH of A. fib, myelodysplastic syndrome with hemolytic anemia, history of stroke. Patient presented to the ED on 1/6 with complaint of shortness of breath, weakness. Patient was recently hospitalized for hemolytic anemia, required IVIG and prednisone.  1/6, she was at oncologist's office and was found to have weakness and shortness of breath and hence she was transferred to the ED.  Patient states over the last 3 days patient has become short of breath with productive cough subjective feeling of fever chills lightheadedness.  In the ED, patient was afebrile, hypotensive in 90s and mildly tachycardic. She was noted to be in A. fib with RVR. Blood pressure and heart rate improved with IV fluid and Cardizem drip. Chest x-ray unremarkable Covid screening antigen positive. Patient was admitted under hospitalist service for further evaluation and management.  Subjective: Patient was seen and examined this morning.  Pleasant elderly Caucasian female.  Not in distress. Not on oxygen supplementation. Remains on Cardizem drip at the time of my evaluation.  Assessment/Plan: COVID infection -Presented with weakness, shortness of breath -Covid screening antigen test positive -Chest imaging -no infiltrate. -Patient also complains of diarrhea and weakness which are more profound than her respiratory symptoms. -Treatment: IV Remdesivir for 5 days to complete on 1/11.  Also continued on home dose of prednisone. -Supportive care: Vitamin C, Zinc, inhalers, Tylenol, Antitussives - benzonatate, Mucinex -Oxygen -not on oxygen supplementation at rest. -Continue airborne/contact isolation precautions. -WBC normal. inflammatory markers are however  elevated.  Continue to trend.   Recent Labs  Lab 10/24/19 1420 10/25/19 0546  WBC 6.5 3.1*   Recent Labs    10/24/19 1421 10/25/19 0546  FERRITIN  --  2,727*  LDH 695*  --   CRP  --  5.4*   A. fib with RVR -Cardizem drip was started in ED. -Prior to admission, patient was on metoprolol 12.5 mg twice daily.  I increased the dose to 25 mg twice daily.  Plan is to gradually taper the patient off Cardizem.  Acute renal failure - Cr 1.33 on admission. likely from poor oral intake and dehydration for which patient received fluids follow metabolic panel.   Hemolytic anemia with myelodysplastic syndrome -Patient is currently on prednisone tapering dose.  Per patient, starting today patient was supposed to taper down to 60 mg of prednisone daily for a week.  I ordered for the same. -Continue to follow-up with oncology as an outpatient.   -Her hemoglobin level is down to 7 today.  Repeat CBC tomorrow.  Mobility: Increase ambulation.  Independent at home prior to admission. Diet: Cardiac diet Fluid: Normal saline at 75 mL/h DVT prophylaxis:  Xarelto Code Status:  DNR Family Communication:  Called and updated patient's daughter Ms. Alexandra Pierce today. Expected Discharge:  Anticipate 2-3 more days of hospital stay  Consultants:    Procedures:    Antimicrobials: Anti-infectives (From admission, onward)   Start     Dose/Rate Route Frequency Ordered Stop   10/26/19 1000  remdesivir 100 mg in sodium chloride 0.9 % 100 mL IVPB     100 mg 200 mL/hr over 30 Minutes Intravenous Daily 10/25/19 0507 10/30/19 0959   10/25/19 0600  remdesivir 200 mg in sodium  chloride 0.9% 250 mL IVPB     200 mg 580 mL/hr over 30 Minutes Intravenous Once 10/25/19 0507 10/25/19 0630        Code Status: DNR   Diet Order            Diet Heart Room service appropriate? Yes; Fluid consistency: Thin  Diet effective now              Infusions:  . sodium chloride 75 mL/hr at 10/25/19 1548  .  diltiazem (CARDIZEM) infusion Stopped (10/25/19 1207)  . [START ON 10/26/2019] remdesivir 100 mg in NS 100 mL      Scheduled Meds: . Chlorhexidine Gluconate Cloth  6 each Topical Daily  . fenofibrate  160 mg Oral Daily  . folic acid  5 mg Oral Daily  . metoprolol tartrate  25 mg Oral BID  . [START ON 10/26/2019] predniSONE  60 mg Oral Q breakfast   Followed by  . [START ON 11/02/2019] predniSONE  50 mg Oral Q breakfast  . Rivaroxaban  15 mg Oral Q breakfast  . cyanocobalamin  1,000 mcg Oral Daily    PRN meds: acetaminophen **OR** acetaminophen, benzonatate, ondansetron **OR** ondansetron (ZOFRAN) IV   Objective: Vitals:   10/25/19 1500 10/25/19 1546  BP: 100/60   Pulse: 86 90  Resp: 15 (!) 21  Temp:    SpO2: 95% (!) 89%    Intake/Output Summary (Last 24 hours) at 10/25/2019 1657 Last data filed at 10/25/2019 1547 Gross per 24 hour  Intake 1397.03 ml  Output --  Net 1397.03 ml   Filed Weights   10/25/19 1546  Weight: 76.6 kg   Weight change:  Body mass index is 27.26 kg/m.   Physical Exam: General exam: Appears calm and comfortable.  Skin: No rashes, lesions or ulcers. HEENT: Atraumatic, normocephalic, supple neck, no obvious bleeding Lungs: Clear to auscultation bilaterally CVS: Regular rate and rhythm, no murmur GI/Abd soft, nontender, nondistended, bowel sound present CNS: Alert, awake, oriented x3 Psychiatry: Mood appropriate Extremities: No pedal edema, no calf tenderness  Data Review: I have personally reviewed the laboratory data and studies available.  Recent Labs  Lab 10/24/19 1420 10/25/19 0546  WBC 6.5 3.1*  NEUTROABS 5.3 2.2  HGB 8.8* 7.0*  HCT 25.7* 21.2*  MCV 95.2 97.7  PLT 311 240   Recent Labs  Lab 10/24/19 1420 10/24/19 1720 10/25/19 0546  NA 137  --  136  K 4.0  --  4.7  CL 101  --  105  CO2 24  --  23  GLUCOSE 143*  --  130*  BUN 32*  --  35*  CREATININE 1.33*  --  0.89  CALCIUM 8.4*  --  8.2*  MG  --  2.1  --     Terrilee Croak, MD  [facility] 10/25/2019

## 2019-10-25 NOTE — ED Notes (Signed)
NT assisted pt to hospital bed, provided pt with breakfast tray.

## 2019-10-25 NOTE — Progress Notes (Signed)
Received call back from MD. Order put in for a stat CBC. Call back with results.

## 2019-10-25 NOTE — H&P (Addendum)
History and Physical    Alexandra Pierce [id number] DOB: Aug 05, 1946 DOA: 10/24/2019  PCP: Chesley Noon, MD  Patient coming from: Home.  Chief Complaint: Shortness of breath weakness.  HPI: Alexandra Pierce is a 74 y.o. female with history of A. fib and myelodysplastic syndrome with hemolytic anemia and history of stroke was recently admitted for anemia required IVIG and prednisone for hemolytic anemia had followed up with oncologist office yesterday and was found to be short of breath weak and was transferred to the ER.  Patient states over the last 3 days patient has become short of breath with productive cough subjective feeling of fever chills lightheadedness no chest pain has been examined diarrhea off and on.  Denies vomiting.  ED Course: In the ER patient was afebrile mildly hypoxic was found to be in A. fib with RVR with blood pressure in the 0000000 systolic was given fluid bolus following which blood pressure improved.  Patient was started on Cardizem infusion.  Chest x-ray was unremarkable.  Covid test came back positive.  Patient's LDH of 695 high-sensitivity troponin was 8 TSH was 1.046.  Patient is being admitted from acute respiratory failure with hypoxia secondary to COVID-19 infectio it has increased from 0.9 last week it is presently 1.3.  N with A. fib with RVR.  Patient's creatinine also increased from baseline and  Review of Systems: As per HPI, rest all negative.   Past Medical History:  Diagnosis Date  . Chronic lower back pain   . Hypercholesteremia   . Hypertension    mild  . Obesity   . Paroxysmal atrial fibrillation (HCC)    managed with anticoagulation and rate control.   . Stroke [facility]) 2001 & 2015   "right side gets weak sometimes if I'm only tired" (03/16/2016)    Past Surgical History:  Procedure Laterality Date  . CARDIAC CATHETERIZATION  03/31/2000   EF 49%/LAD lg vessel which coursed to the apex & gave rise to 2 diagonal branches/ LAD noted to have 30%  ostial lesion & tapers to a sm vessel toward the apex but no high grade lesion/1st diagonal med sized vessel & 2nd diagonal sm vessel with no significant disease/ lt ventriculogram reveals mild global hypokinesis w/ an estimated EF of 45-50%  . CARDIAC CATHETERIZATION N/A 03/17/2016   Procedure: Left Heart Cath and Coronary Angiography;  Surgeon: Wellington Hampshire, MD;  Location: [place] [facility];  Service: Cardiovascular;  Laterality: N/A;  . LEFT HEART CATH AND CORONARY ANGIOGRAPHY N/A 03/31/2018   Procedure: LEFT HEART CATH AND CORONARY ANGIOGRAPHY;  Surgeon: Martinique, Peter M, MD;  Location: [place] [facility];  Service: Cardiovascular;  Laterality: N/A;     reports that she quit smoking about 41 years ago. Her smoking use included cigarettes. She has a 1.80 pack-year smoking history. She has never used smokeless tobacco. She reports current alcohol use of about 14.0 standard drinks of alcohol per week. She reports that she does not use drugs.  Allergies  Allergen Reactions  . Sulfonamide Derivatives Anaphylaxis  . Lipitor [Atorvastatin] Other (See Comments)    Caused bladder infection  . Pravachol [Pravastatin Sodium] Other (See Comments)    Myalgias  . Statins Other (See Comments)    Myalgia    Family History  Problem Relation Age of Onset  . Heart disease Father   . Asthma Father   . Diabetes Father   . Heart disease Mother   . Heart disease Brother   .  Hyperlipidemia Sister   . Hyperlipidemia Brother   . Hyperlipidemia Brother   . Cancer Neg Hx     Prior to Admission medications   Medication Sig Start Date End Date Taking? Authorizing Provider  Ascorbic Acid (VITAMIN C PO) Take 1 tablet by mouth daily.   Yes [provider]  b complex vitamins capsule Take 1 capsule by mouth daily.    Yes [provider]  benzonatate (TESSALON) 100 MG capsule Take 1 capsule (100 mg total) by mouth every 8 (eight) hours as needed for cough. 10/03/19  Yes Corena Herter,  PA-C  cholecalciferol (VITAMIN D) 1000 UNITS tablet Take 1,000 Units by mouth daily.   Yes [provider]  cyanocobalamin 1000 MCG tablet Take 1,000 mcg by mouth daily.    Yes [provider]  fenofibrate 160 MG tablet Take 1 tablet (160 mg total) by mouth daily. 02/16/19  Yes Burtis Junes, NP  fluticasone (FLONASE) 50 MCG/ACT nasal spray Place 1 spray into both nostrils daily.   Yes [provider]  folic acid (FOLVITE) 1 MG tablet Take 5 tablets (5 mg total) by mouth daily. 10/13/19  Yes Alma Friendly, MD  metoprolol tartrate (LOPRESSOR) 25 MG tablet Take 0.5 tablets (12.5 mg total) by mouth 2 (two) times daily. Patient taking differently: Take 25 mg by mouth daily.  04/04/19  Yes Burtis Junes, NP  predniSONE (DELTASONE) 10 MG tablet Take 8 tabs daily for 4 days, then 7 tabs daily for 7 days, then 6 tabs daily for 7 days, then 5 tabs daily for 7 days, then 4 tabs daily till complete 10/13/19  Yes Ezenduka, Adline Peals, MD  XARELTO 20 MG TABS tablet TAKE 1 TABLET BY MOUTH ONCE DAILY Patient taking differently: Take 20 mg by mouth daily.  04/18/19  Yes Martinique, Peter M, MD  doxycycline (VIBRA-TABS) 100 MG tablet Take 1 tablet (100 mg total) by mouth 2 (two) times daily. 10/24/19   Brunetta Genera, MD  esomeprazole (NEXIUM) 40 MG capsule Take 1 capsule (40 mg total) by mouth daily. 10/13/19 11/12/19  Alma Friendly, MD    Physical Exam: Constitutional: Moderately built and nourished. Vitals:   10/25/19 0206 10/25/19 0230 10/25/19 0238 10/25/19 0300  BP: 105/64 98/70 98/70  94/65  Pulse: (!) 108 (!) 103 99 95  Resp: 17 14 14 17   Temp:      TempSrc:      SpO2: 92% 96% 94% 95%   Eyes: Anicteric no pallor. ENMT: No discharge from the ears eyes nose or mouth. Neck: No mass felt.  No neck rigidity. Respiratory: No rhonchi or crepitations. Cardiovascular: S1-S2 heard. Abdomen: Soft nontender bowel sounds present. Musculoskeletal: No edema. Skin: No  rash. Neurologic: Alert awake oriented to time place and person.  Moves all extremities. Psychiatric: Appears normal per normal affect.   Labs on Admission: I have personally reviewed following labs and imaging studies  CBC: Recent Labs  Lab 10/18/19 1233 10/24/19 1420  WBC 6.2 6.5  NEUTROABS 4.4 5.3  HGB 10.2* 8.8*  HCT 31.0* 25.7*  MCV 97.2 95.2  PLT 309 [id number]   Basic Metabolic Panel: Recent Labs  Lab 10/18/19 1233 10/24/19 1420 10/24/19 1720  NA 138 137  --   K 3.2* 4.0  --   CL 103 101  --   CO2 27 24  --   GLUCOSE 112* 143*  --   BUN 23 32*  --   CREATININE 0.96 1.33*  --  CALCIUM 8.4* 8.4*  --   MG  --   --  2.1   GFR: Estimated Creatinine Clearance: 38.4 mL/min (A) (by C-G formula based on SCr of 1.33 mg/dL (H)). Liver Function Tests: Recent Labs  Lab 10/18/19 1233 10/24/19 1420  AST 18 50*  ALT 24 32  ALKPHOS 40 41  BILITOT 2.4* 4.6*  PROT 7.7 7.3  ALBUMIN 3.7 3.6   No results for input(s): LIPASE, AMYLASE in the last 168 hours. No results for input(s): AMMONIA in the last 168 hours. Coagulation Profile: No results for input(s): INR, PROTIME in the last 168 hours. Cardiac Enzymes: No results for input(s): CKTOTAL, CKMB, CKMBINDEX, TROPONINI in the last 168 hours. BNP (last 3 results) No results for input(s): PROBNP in the last 8760 hours. HbA1C: No results for input(s): HGBA1C in the last 72 hours. CBG: No results for input(s): GLUCAP in the last 168 hours. Lipid Profile: No results for input(s): CHOL, HDL, LDLCALC, TRIG, CHOLHDL, LDLDIRECT in the last 72 hours. Thyroid Function Tests: Recent Labs    10/24/19 1720  TSH 1.046   Anemia Panel: Recent Labs    10/24/19 1420  RETICCTPCT 7.0*   Urine analysis:    Component Value Date/Time   COLORURINE AMBER (A) 10/24/2019 2020   APPEARANCEUR HAZY (A) 10/24/2019 2020   LABSPEC 1.017 10/24/2019 2020   PHURINE 5.0 10/24/2019 2020   GLUCOSEU 50 (A) 10/24/2019 2020   HGBUR SMALL (A)  10/24/2019 2020   BILIRUBINUR NEGATIVE 10/24/2019 2020   KETONESUR NEGATIVE 10/24/2019 2020   PROTEINUR NEGATIVE 10/24/2019 2020   UROBILINOGEN 0.2 01/11/2014 1200   NITRITE NEGATIVE 10/24/2019 2020   LEUKOCYTESUR SMALL (A) 10/24/2019 2020   Sepsis Labs: @LABRCNTIP (procalcitonin:4,lacticidven:4) )No results found for this or any previous visit (from the past 240 hour(s)).   Radiological Exams on Admission: DG Chest Portable 1 View  Result Date: 10/24/2019 CLINICAL DATA:  Cough. EXAM: PORTABLE CHEST 1 VIEW COMPARISON:  October 10, 2019 FINDINGS: Mildly decreased lung volumes are seen likely secondary to suboptimal patient inspiration. There is no evidence of acute infiltrate, pleural effusion or pneumothorax. The heart size and mediastinal contours are within normal limits. Degenerative changes seen throughout the thoracic spine. IMPRESSION: No active disease. Electronically Signed   By: Virgina Norfolk M.D.   On: 10/24/2019 17:36    EKG: Independently reviewed.  A. fib with RVR.  Assessment/Plan Principal Problem:   Acute respiratory failure due to COVID-19 [facility]) Active Problems:   ARF (acute renal failure) (HCC)   Warm antibody hemolytic anemia   MDS (myelodysplastic syndrome) (HCC)   Atrial fibrillation with RVR ([place])    1. Acute respiratory failure with hypoxia secondary to COVID-19 infection and also likely contribution from A. fib for which patient is on IV remdesivir.  Will discuss with pharmacy about continuing her prednisone dose which she takes for the hemolytic anemia and is supposed to be on it for at least for next 2 to 3 months at a tapering dose.  Patient is influenza markers are pending.  Patient has not been given any Actemra given the history of hemolytic anemia and myelodysplastic anemia for which we will need to talk with patient's oncologist. 2. A. fib with RVR for which Cardizem infusion has been started.  Will try to restart her beta-blockers in the morning if  blood pressure allows then try to wean off the Cardizem.  If patient has stable A. fib with RVR and blood pressure drops will need to start alternating medications like amiodarone.  Patient is on Xarelto. 3. Acute renal failure likely from poor oral intake and dehydration for which patient received fluids follow metabolic panel. 4. Hemolytic anemia with myelodysplastic syndrome being followed by oncologist presently on prednisone taper dose.  Follow CBC closely. 5. Hypotension likely from dehydration from the diarrhea and poor oral intake likely secondary to the Covid infection does not look septic.  Getting fluids at this time.  Given the acute respiratory failure with acute renal failure COVID-19 infection will need inpatient status.  Note that patient's Decadron dose was adjusted with the prednisone dose she is taking now for the hemolytic anemia and at discharge patient will need to go back on prednisone after the Decadron.   DVT prophylaxis: Xarelto. Code Status: DNR confirmed with patient. Family Communication: Discussed with patient. Disposition Plan: Home. Consults called: None. Admission status: Inpatient.   Rise Patience MD [facility] Pager [phone].  If 7PM-7AM, please contact night-coverage [email or web address] Password [email or web address]  10/25/2019, 5:04 AM

## 2019-10-25 NOTE — Progress Notes (Signed)
Placed Call to MD. Pt HGB is 7.0. Pt BP is running soft 96/53. Pt feeling lightheaded. Pt stated per cancer clinic like to keep her HGB at 8.4. Awaiting call back.

## 2019-10-26 ENCOUNTER — Ambulatory Visit: Payer: Medicare Other | Admitting: Hematology

## 2019-10-26 ENCOUNTER — Other Ambulatory Visit: Payer: Medicare Other

## 2019-10-26 LAB — URINE CULTURE

## 2019-10-26 MED ORDER — ZOLPIDEM TARTRATE 5 MG PO TABS
5.0000 mg | ORAL_TABLET | Freq: Every evening | ORAL | Status: DC | PRN
  Administered 2019-10-26 – 2019-10-27 (×2): 5 mg via ORAL
  Filled 2019-10-26 (×2): qty 1

## 2019-10-26 MED ORDER — ADULT MULTIVITAMIN W/MINERALS CH
1.0000 | ORAL_TABLET | Freq: Every day | ORAL | Status: DC
  Administered 2019-10-27 – 2019-10-29 (×3): 1 via ORAL
  Filled 2019-10-26 (×4): qty 1

## 2019-10-26 MED ORDER — PRO-STAT SUGAR FREE PO LIQD
30.0000 mL | Freq: Every day | ORAL | Status: DC
  Administered 2019-10-27 – 2019-10-28 (×2): 30 mL via ORAL
  Filled 2019-10-26 (×2): qty 30

## 2019-10-26 MED ORDER — RIVAROXABAN 20 MG PO TABS
20.0000 mg | ORAL_TABLET | Freq: Every day | ORAL | Status: DC
  Administered 2019-10-27 – 2019-10-29 (×3): 20 mg via ORAL
  Filled 2019-10-26 (×3): qty 1

## 2019-10-26 NOTE — Progress Notes (Signed)
PROGRESS NOTE  Alexandra Pierce  DOB: [birth year]  PCP: Chesley Noon, MD [id number]  DOA: 10/24/2019 Admitted From: Home  LOS: 2 days   Chief Complaint  Patient presents with  . Weakness  . COVID   Brief narrative: Alexandra Pierce is a 74 y.o. female with PMH of A. fib, myelodysplastic syndrome with hemolytic anemia, history of stroke. Patient presented to the ED on 1/6 with complaint of shortness of breath, weakness. Patient was recently hospitalized for hemolytic anemia, required IVIG and prednisone. 1/6, she was at oncologist's office and was found to have weakness and shortness of breath and hence she was transferred to the ED.  Patient states over the last 3 days patient has become short of breath with productive cough subjective feeling of fever chills lightheadedness.  In the ED, patient was afebrile, hypotensive in 90s and mildly tachycardic. She was noted to be in A. fib with RVR. Blood pressure and heart rate improved with IV fluid and Cardizem drip. Chest x-ray unremarkable Covid screening antigen positive. Patient was admitted under hospitalist service for further evaluation and management.  Subjective: Patient was seen and examined this morning.  Pleasant elderly Caucasian female.  Not in distress. Sitting up in chair.  Not on oxygen.  Feels significantly weak though. Off Cardizem drip.  Assessment/Plan: COVID infection -Presented with weakness, shortness of breath -Covid screening antigen test positive -Chest imaging -no infiltrate. -Patient also complains of diarrhea and weakness which are more profound than her respiratory symptoms. -Treatment: IV Remdesivir for 5 days to complete on 1/11.  Also continued on home dose of prednisone. -Supportive care: Vitamin C, Zinc, inhalers, Tylenol, Antitussives - benzonatate, Mucinex -Oxygen -not on oxygen supplementation at rest. -Continue airborne/contact isolation precautions. -WBC normal. inflammatory markers are  however elevated.  Continue to trend.   Recent Labs  Lab 10/24/19 1420 10/25/19 0546 10/25/19 2031  WBC 6.5 3.1* 4.3   Recent Labs    10/24/19 1421 10/25/19 0546  FERRITIN  --  2,727*  LDH 695*  --   CRP  --  5.4*   A. fib with RVR -Cardizem drip was started in ED which was eventually stopped. -Prior to admission, patient was on metoprolol 12.5 mg twice daily.  I increased the dose to 25 mg twice daily.  Monitor in telemetry.  Acute renal failure -Cr 1.33 on admission.  -Creatinine improved to normal with IV hydration.    Hemolytic anemia with myelodysplastic syndrome -Patient follows up with oncologist.  As an outpatient, she was on prednisone tapering course. 1/7, patient is on prednisone 60 mg daily for 1 week, continue tapering course as previously planned.  -Her hemoglobin level had dropped down to 7 on 1/7.  Improved to 7.9 on its own today.  No need of transfusion. Continue to monitor.    Generalized weakness Impaired mobility -Prior to this, patient was independent living at home.  She feels very weak on minimal exertion.  Eval ordered.  Diet: Cardiac diet Fluid: Normal saline at 75 mL/h DVT prophylaxis:  Xarelto Code Status:  DNR Family Communication: Called and updated patient's daughter Ms. Lynann Beaver today. Expected Discharge:  Anticipate 2-3 more days of hospital stay  Consultants:    Procedures:    Antimicrobials: Anti-infectives (From admission, onward)   Start     Dose/Rate Route Frequency Ordered Stop   10/26/19 1000  remdesivir 100 mg in sodium chloride 0.9 % 100 mL IVPB     100 mg 200 mL/hr over 30 Minutes Intravenous  Daily 10/25/19 0507 10/30/19 0959   10/25/19 0600  remdesivir 200 mg in sodium chloride 0.9% 250 mL IVPB     200 mg 580 mL/hr over 30 Minutes Intravenous Once 10/25/19 0507 10/25/19 0630        Code Status: DNR   Diet Order            Diet Heart Room service appropriate? Yes; Fluid consistency: Thin  Diet effective  now              Infusions:  . diltiazem (CARDIZEM) infusion Stopped (10/25/19 1207)  . remdesivir 100 mg in NS 100 mL Stopped (10/26/19 0948)    Scheduled Meds: . Chlorhexidine Gluconate Cloth  6 each Topical Daily  . feeding supplement (PRO-STAT SUGAR FREE 64)  30 mL Oral Daily  . fenofibrate  160 mg Oral Daily  . folic acid  5 mg Oral Daily  . metoprolol tartrate  25 mg Oral BID  . multivitamin with minerals  1 tablet Oral Daily  . predniSONE  60 mg Oral Q breakfast   Followed by  . [START ON 11/02/2019] predniSONE  50 mg Oral Q breakfast  . [START ON 10/27/2019] rivaroxaban  20 mg Oral Q breakfast  . cyanocobalamin  1,000 mcg Oral Daily    PRN meds: acetaminophen **OR** acetaminophen, benzonatate, ondansetron **OR** ondansetron (ZOFRAN) IV, zolpidem   Objective: Vitals:   10/26/19 1544 10/26/19 1600  BP:  (!) 108/55  Pulse:  (!) 102  Resp:  (!) 28  Temp: 97.9 F (36.6 C)   SpO2:  92%    Intake/Output Summary (Last 24 hours) at 10/26/2019 1645 Last data filed at 10/26/2019 1417 Gross per 24 hour  Intake 1331.63 ml  Output --  Net 1331.63 ml   Filed Weights   10/25/19 1546  Weight: 76.6 kg   Weight change:  Body mass index is 27.26 kg/m.   Physical Exam: General exam: Appears calm and comfortable.  Sitting up in chair.  Not in physical distress Skin: No rashes, lesions or ulcers. HEENT: Atraumatic, normocephalic, supple neck, no obvious bleeding Lungs: Clear to auscultation bilaterally CVS: Regular rate and rhythm, no murmur GI/Abd soft, nontender, nondistended, bowel sound present CNS: Alert, awake, oriented x3 Psychiatry: Mood appropriate Extremities: No pedal edema, no calf tenderness  Data Review: I have personally reviewed the laboratory data and studies available.  Recent Labs  Lab 10/24/19 1420 10/25/19 0546 10/25/19 2031  WBC 6.5 3.1* 4.3  NEUTROABS 5.3 2.2  --   HGB 8.8* 7.0* 7.9*  HCT 25.7* 21.2* 24.2*  MCV 95.2 97.7 99.6  PLT 311 240  290   Recent Labs  Lab 10/24/19 1420 10/24/19 1720 10/25/19 0546  NA 137  --  136  K 4.0  --  4.7  CL 101  --  105  CO2 24  --  23  GLUCOSE 143*  --  130*  BUN 32*  --  35*  CREATININE 1.33*  --  0.89  CALCIUM 8.4*  --  8.2*  MG  --  2.1  --     Terrilee Croak, MD  [facility] 10/26/2019

## 2019-10-26 NOTE — Progress Notes (Signed)
Initial Nutrition Assessment  RD working remotely.   DOCUMENTATION CODES:   Not applicable  INTERVENTION:  - will order 30 mL Prostat once/day, each supplement provides 100 kcal and 15 grams of protein. - will order Magic Cup BID with meals, each supplement provides 290 kcal and 9 grams of protein. - will order daily multivitamin with minerals. - continue to encourage PO intakes.    NUTRITION DIAGNOSIS:   Increased nutrient needs related to acute illness(COVID-19) as evidenced by estimated needs.  GOAL:   Patient will meet greater than or equal to 90% of their needs  MONITOR:   PO intake, Supplement acceptance, Labs, Weight trends  REASON FOR ASSESSMENT:   Malnutrition Screening Tool  ASSESSMENT:   74 y.o. female with medical history of A. fib, myelodysplastic syndrome with hemolytic anemia, and history of stroke. She presented to the ED on 1/6 from Oncologist's office due to SOB and weakness. She was recently admitted due to hemolytic anemia which required IVIG and prednisone. In the 3 days PTA patient was experiencing SOB, productive cough, subjective fever, chills, and lightheadedness. CXR was unremarkable; COVID test was positive.  No intakes documented since admission. Able to talk with RN who reports patient ate really well for dinner last night and that she ate 100% of breakfast this AM without any difficulties. RN reports patient was having some coughing during the meal, but she has been having intermittent cough thought to be d/t COVID and that coughs during meal did not appear to be d/t PO intakes/aspiration of any kind.  Per chart review, current weight is 169 lb and weight on 09/06/19 was 177 lb. This indicates 8 lb weight loss (4.5% body weight) in the past 1.5 months; not significant for time frame.   Per notes: - COVID+ with associated weakness, SOB, and cough - afib with RVR - ARF--thought to be 2/2 poor oral intake and dehydration on admission - hemolytic  anemia with myelodysplastic syndrome--on tapering predisone dose   Labs reviewed; Ca: 8.2 mg/dl, BUN: 25 mg/dl. Medications reviewed; 5 mg folvite/day, 60 mg deltasone/day, 200 mg remdesivir x1 dose 1/7, 100 mg remdesivir x1 dose/day 1/8-1/11, 1000 mcg oral cyanocobalamin/day.      NUTRITION - FOCUSED PHYSICAL EXAM:  unable to complete for COVID+ patient.   Diet Order:   Diet Order            Diet Heart Room service appropriate? Yes; Fluid consistency: Thin  Diet effective now              EDUCATION NEEDS:   No education needs have been identified at this time  Skin:  Skin Assessment: Reviewed RN Assessment  Last BM:  PTA/unknown  Height:   Ht Readings from Last 1 Encounters:  10/25/19 5\' 6"  (1.676 m)    Weight:   Wt Readings from Last 1 Encounters:  10/25/19 76.6 kg    Ideal Body Weight:  59.1 kg  BMI:  Body mass index is 27.26 kg/m.  Estimated Nutritional Needs:   Kcal:  [id number] kcal  Protein:  90-105 grams  Fluid:  >/= 2 L/day     Jarome Matin, MS, RD, LDN, [facility] Inpatient Clinical Dietitian Pager # [phone] After hours/weekend pager # [phone]

## 2019-10-27 NOTE — Progress Notes (Signed)
Patient rested comfortably overnight with no complaints of pain. VSS overnight and a febrile.

## 2019-10-27 NOTE — Progress Notes (Addendum)
PROGRESS NOTE  Alexandra Pierce [id number] DOB: Sep 13, 1946 DOA: 10/24/2019 PCP: Chesley Noon, MD  HPI/Recap of past 24 hours: Per admitting HPI:Alexandra Pierce is a 74 y.o. female with PMH of A. fib, myelodysplastic syndrome with hemolytic anemia, history of stroke. Patient presented to the ED on 1/6 with complaint of shortness of breath, weakness. Patient was recently hospitalized for hemolytic anemia, required IVIG and prednisone. 1/6, she was at oncologist's office and was found to have weakness and shortness of breath and hence she was transferred to the ED.  Patient states over the last 3 days patient has become short of breath with productive cough subjective feeling of fever chills lightheadedness.  In the ED, patient was afebrile, hypotensive in 90s and mildly tachycardic. She was noted to be in A. fib with RVR. Blood pressure and heart rate improved with IV fluid and Cardizem drip. Chest x-ray unremarkable Covid screening antigen positive. Patient was admitted under hospitalist service for further evaluation and management.  October 27, 2019. Subjective: Patient seen and examined at bedside.  Patient is currently on oxygen and on room air and saturating at 94 to 95%.  She is still on IV remdesivir which she will continue until Monday  Assessment/Plan: Principal Problem:   Acute respiratory failure due to COVID-19 [facility]) Active Problems:   ARF (acute renal failure) (HCC)   Warm antibody hemolytic anemia   MDS (myelodysplastic syndrome) (HCC)   Atrial fibrillation with RVR ([place])  #1 COVID infection  Patient presented with weakness shortness of breath was found to have positive Covid her chest x-ray does not show any infiltrate.  Patient was started on remdesivir.  And she is to complete the remdesivir on October 29, 2019.  She is doing much better.  We will continue supportive care.  Currently she is not on oxygen supplementation she is doing well at room air.  We will continue  to monitor WBC and inflammatory markers   #2 A. fib with RVR  -Patient was admitted with A. fib with rapid ventricular rate and started on Cardizem drip in the ED and that has been stopped her heart rate is normal now. She is being maintained on metoprolol 25 mg twice a day for home -Prior to admission, patient was on metoprolol 12.5 mg twice daily.  I increased the dos.  #3 acute renal failure -Admitting creatinine was 1.33.  She was given IV hydration and creatinine has improved we will continue to monitor  #4 hemolytic anemia with myelodysplastic syndrome Patient is following up with oncologist as outpatient she is currently on prednisone 60 mg daily her hemoglobin had originally dropped but she was not transfused because there was no need for transfusion at that time we will continue to monitor H&H  #5 generalized weakness Impaired mobility Prior to admission patient was living independently at home she still feeling very weak due to COVID-19 infection.  Physical therapy evaluation has been ordered    Severity of Illness: The appropriate patient status for this patient is INPATIENT. Inpatient status is judged to be reasonable and necessary in order to provide the required intensity of service to ensure the patient's safety. The patient's presenting symptoms, physical exam findings, and initial radiographic and laboratory data in the context of their chronic comorbidities is felt to place them at high risk for further clinical deterioration. Furthermore, it is not anticipated that the patient will be medically stable for discharge from the hospital within 2 midnights of admission. The following factors support  the patient status of inpatient.   " The patient's presenting symptoms include hypoxic respiratory failure. " The worrisome physical exam findings include generalized weakness  " The initial radiographic and laboratory data are worrisome because of needing close monitor on  remdesivir. " The chronic co-morbidities include atrial fibrillation.   * I certify that at the point of admission it is my clinical judgment that the patient will require inpatient hospital care spanning beyond 2 midnights from the point of admission due to high intensity of service, high risk for further deterioration and high frequency of surveillance required.*  Code Status: DNR  Family Communication: None at bedside  Disposition Plan: To be determined   Consultants:  None  Procedures:  None  Antimicrobials:   remdesivir  DVT prophylaxis: Xarelto  Objective: Vitals:   10/27/19 0400 10/27/19 0500 10/27/19 0600 10/27/19 0700  BP: (!) 82/64 (!) 113/57    Pulse: 99 94 91 90  Resp: 19 17 18 17   Temp: 98.1 F (36.7 C)     TempSrc: Oral     SpO2: 94% 94% 94% 94%  Weight:      Height:        Intake/Output Summary (Last 24 hours) at 10/27/2019 0820 Last data filed at 10/26/2019 1417 Gross per 24 hour  Intake 338.06 ml  Output --  Net 338.06 ml   Filed Weights   10/25/19 1546  Weight: 76.6 kg   Body mass index is 27.26 kg/m.  Exam:  . General: 74 y.o. year-old female well developed well nourished in no acute distress.  Alert and oriented x3. . Cardiovascular: Regular rate and rhythm with no rubs or gallops.  No thyromegaly or JVD noted.   [name] Kitchen Respiratory: Clear to auscultation with no wheezes or rales. Good inspiratory effort. . Abdomen: Soft nontender nondistended with normal bowel sounds x4 quadrants. . Musculoskeletal: No lower extremity edema. 2/4 pulses in all 4 extremities. . Skin: No ulcerative lesions noted or rashes, . Psychiatry: Mood is appropriate for condition and setting    Data Reviewed: CBC: Recent Labs  Lab 10/24/19 1420 10/25/19 0546 10/25/19 2031  WBC 6.5 3.1* 4.3  NEUTROABS 5.3 2.2  --   HGB 8.8* 7.0* 7.9*  HCT 25.7* 21.2* 24.2*  MCV 95.2 97.7 99.6  PLT 311 240 [id number]   Basic Metabolic Panel: Recent Labs  Lab 10/24/19 1420  10/24/19 1720 10/25/19 0546  NA 137  --  136  K 4.0  --  4.7  CL 101  --  105  CO2 24  --  23  GLUCOSE 143*  --  130*  BUN 32*  --  35*  CREATININE 1.33*  --  0.89  CALCIUM 8.4*  --  8.2*  MG  --  2.1  --    GFR: Estimated Creatinine Clearance: 58 mL/min (by C-G formula based on SCr of 0.89 mg/dL). Liver Function Tests: Recent Labs  Lab 10/24/19 1420 10/25/19 0546  AST 50* 37  ALT 32 33  ALKPHOS 41 28*  BILITOT 4.6* 2.6*  PROT 7.3 6.4*  ALBUMIN 3.6 3.3*   No results for input(s): LIPASE, AMYLASE in the last 168 hours. No results for input(s): AMMONIA in the last 168 hours. Coagulation Profile: No results for input(s): INR, PROTIME in the last 168 hours. Cardiac Enzymes: No results for input(s): CKTOTAL, CKMB, CKMBINDEX, TROPONINI in the last 168 hours. BNP (last 3 results) No results for input(s): PROBNP in the last 8760 hours. HbA1C: No results for input(s): HGBA1C in the  last 72 hours. CBG: No results for input(s): GLUCAP in the last 168 hours. Lipid Profile: No results for input(s): CHOL, HDL, LDLCALC, TRIG, CHOLHDL, LDLDIRECT in the last 72 hours. Thyroid Function Tests: Recent Labs    10/25/19 0546  TSH 0.496   Anemia Panel: Recent Labs    10/24/19 1420 10/25/19 0546  FERRITIN  --  2,727*  RETICCTPCT 7.0*  --    Urine analysis:    Component Value Date/Time   COLORURINE AMBER (A) 10/24/2019 2020   APPEARANCEUR HAZY (A) 10/24/2019 2020   LABSPEC 1.017 10/24/2019 2020   PHURINE 5.0 10/24/2019 2020   GLUCOSEU 50 (A) 10/24/2019 2020   HGBUR SMALL (A) 10/24/2019 2020   BILIRUBINUR NEGATIVE 10/24/2019 2020   KETONESUR NEGATIVE 10/24/2019 2020   PROTEINUR NEGATIVE 10/24/2019 2020   UROBILINOGEN 0.2 01/11/2014 1200   NITRITE NEGATIVE 10/24/2019 2020   LEUKOCYTESUR SMALL (A) 10/24/2019 2020   Sepsis Labs: @LABRCNTIP (procalcitonin:4,lacticidven:4)  ) Recent Results (from the past 240 hour(s))  Urine culture     Status: Abnormal   Collection Time:  10/24/19  8:20 PM   Specimen: Urine, Clean Catch  Result Value Ref Range Status   Specimen Description   Final    URINE, CLEAN CATCH Performed at [facility], [place] [address] [place], [place] [address]    Special Requests   Final    NONE Performed at [facility], [place] [address] [place], [place] [address]    Culture MULTIPLE SPECIES PRESENT, SUGGEST RECOLLECTION (A)  Final   Report Status 10/26/2019 FINAL  Final  MRSA PCR Screening     Status: None   Collection Time: 10/25/19  3:39 PM   Specimen: Nasal Mucosa; Nasopharyngeal  Result Value Ref Range Status   MRSA by PCR NEGATIVE NEGATIVE Final    Comment:        The GeneXpert MRSA Assay (FDA approved for NASAL specimens only), is one component of a comprehensive MRSA colonization surveillance program. It is not intended to diagnose MRSA infection nor to guide or monitor treatment for MRSA infections. Performed at [facility], [place] [address], [place], [place] [address]       Studies: No results found.  Scheduled Meds: . Chlorhexidine Gluconate Cloth  6 each Topical Daily  . feeding supplement (PRO-STAT SUGAR FREE 64)  30 mL Oral Daily  . fenofibrate  160 mg Oral Daily  . folic acid  5 mg Oral Daily  . metoprolol tartrate  25 mg Oral BID  . multivitamin with minerals  1 tablet Oral Daily  . predniSONE  60 mg Oral Q breakfast   Followed by  . [START ON 11/02/2019] predniSONE  50 mg Oral Q breakfast  . rivaroxaban  20 mg Oral Q breakfast  . cyanocobalamin  1,000 mcg Oral Daily    Continuous Infusions: . diltiazem (CARDIZEM) infusion Stopped (10/25/19 1207)  . remdesivir 100 mg in NS 100 mL Stopped (10/26/19 0948)     LOS: 3 days     Cristal Deer, MD [facility]  To reach me or the doctor on call, go to: [email or web address] Password [email or web address]  10/27/2019, 8:20 AM

## 2019-10-28 NOTE — Progress Notes (Signed)
PROGRESS NOTE  FRANCE POWE [id number] DOB: 04/15/1946 DOA: 10/24/2019 PCP: Chesley Noon, MD  HPI/Recap of past 24 hours: Per admitting HPI:Maely Viona Gilmore Resendez is a 74 y.o. female with PMH of A. fib, myelodysplastic syndrome with hemolytic anemia, history of stroke. Patient presented to the ED on 1/6 with complaint of shortness of breath, weakness. Patient was recently hospitalized for hemolytic anemia, required IVIG and prednisone. 1/6, she was at oncologist's office and was found to have weakness and shortness of breath and hence she was transferred to the ED.  Patient states over the last 3 days patient has become short of breath with productive cough subjective feeling of fever chills lightheadedness.  In the ED, patient was afebrile, hypotensive in 90s and mildly tachycardic. She was noted to be in A. fib with RVR. Blood pressure and heart rate improved with IV fluid and Cardizem drip. Chest x-ray unremarkable Covid screening antigen positive. Patient was admitted under hospitalist service for further evaluation and management.  October 27, 2019. Subjective: Patient seen and examined at bedside.  Patient is currently on oxygen and on room air and saturating at 94 to 95%.  She is still on IV remdesivir which she will continue until Monday.  October 28, 2019 Subjective: Patient seen and examined at bedside she is doing much better she is off of oxygen and still saturating normal.  She has had her last dose of remdesivir tomorrow January 11.  Which she will complete I was going to see if she could go home and take the rest of her medication treatment outpatient but she stated she lives alone and that her daughter will be flying in tomorrow.  The patient will be discharged home tomorrow after her last dose of remdesivir.   Assessment/Plan: Principal Problem:   Acute respiratory failure due to COVID-19 [facility]) Active Problems:   ARF (acute renal failure) (HCC)   Warm antibody hemolytic  anemia   MDS (myelodysplastic syndrome) (HCC)   Atrial fibrillation with RVR ([place])  #1 COVID infection  Patient presented with weakness shortness of breath was found to have positive Covid her chest x-ray does not show any infiltrate.  Patient was started on remdesivir.  And she is to complete the remdesivir on October 29, 2019.  She is doing much better.  We will continue supportive care.  Currently she is not on oxygen supplementation she is doing well at room air.  We will continue to monitor WBC and inflammatory markers   #2 A. fib with RVR  -Patient was admitted with A. fib with rapid ventricular rate and started on Cardizem drip in the ED and that has been stopped her heart rate is normal now. She is being maintained on metoprolol 25 mg twice a day for home -Prior to admission, patient was on metoprolol 12.5 mg twice daily.  I increased the dos.  #3 acute renal failure -Admitting creatinine was 1.33.  She was given IV hydration and creatinine has improved we will continue to monitor  #4 hemolytic anemia with myelodysplastic syndrome Patient is following up with oncologist as outpatient she is currently on prednisone 60 mg daily her hemoglobin had originally dropped but she was not transfused because there was no need for transfusion at that time we will continue to monitor H&H  #5 generalized weakness Impaired mobility Prior to admission patient was living independently at home she still feeling very weak due to COVID-19 infection.  Physical therapy evaluation has been ordered    Severity of  Illness: The appropriate patient status for this patient is INPATIENT. Inpatient status is judged to be reasonable and necessary in order to provide the required intensity of service to ensure the patient's safety. The patient's presenting symptoms, physical exam findings, and initial radiographic and laboratory data in the context of their chronic comorbidities is felt to place them at high  risk for further clinical deterioration. Furthermore, it is not anticipated that the patient will be medically stable for discharge from the hospital within 2 midnights of admission. The following factors support the patient status of inpatient.   " The patient's presenting symptoms include hypoxic respiratory failure. " The worrisome physical exam findings include generalized weakness  " The initial radiographic and laboratory data are worrisome because of needing close monitor on remdesivir. " The chronic co-morbidities include atrial fibrillation.   * I certify that at the point of admission it is my clinical judgment that the patient will require inpatient hospital care spanning beyond 2 midnights from the point of admission due to high intensity of service, high risk for further deterioration and high frequency of surveillance required.*  Code Status: DNR  Family Communication: None at bedside  Disposition Plan: To be determined   Consultants:  None  Procedures:  None  Antimicrobials:   remdesivir  DVT prophylaxis: Xarelto  Objective: Vitals:   10/27/19 1952 10/28/19 0500 10/28/19 0612 10/28/19 1411  BP: 100/75  107/66 104/73  Pulse: 100  79 87  Resp: 18  16 18   Temp:   98 F (36.7 C) 98.1 F (36.7 C)  TempSrc:   Oral Oral  SpO2: 95%  96% 96%  Weight:  75.2 kg    Height:        Intake/Output Summary (Last 24 hours) at 10/28/2019 1818 Last data filed at 10/28/2019 1100 Gross per 24 hour  Intake 200 ml  Output --  Net 200 ml   Filed Weights   10/25/19 1546 10/28/19 0500  Weight: 76.6 kg 75.2 kg   Body mass index is 26.74 kg/m.  Exam:  . General: 74 y.o. year-old female well developed well nourished in no acute distress.  Alert and oriented x3. . Cardiovascular: Regular rate and rhythm with no rubs or gallops.  No thyromegaly or JVD noted.   [name] Kitchen Respiratory: Clear to auscultation with no wheezes or rales. Good inspiratory effort. . Abdomen: Soft  nontender nondistended with normal bowel sounds x4 quadrants. . Musculoskeletal: No lower extremity edema. 2/4 pulses in all 4 extremities. . Skin: No ulcerative lesions noted or rashes, . Psychiatry: Mood is appropriate for condition and setting    Data Reviewed: CBC: Recent Labs  Lab 10/24/19 1420 10/25/19 0546 10/25/19 2031  WBC 6.5 3.1* 4.3  NEUTROABS 5.3 2.2  --   HGB 8.8* 7.0* 7.9*  HCT 25.7* 21.2* 24.2*  MCV 95.2 97.7 99.6  PLT 311 240 [id number]   Basic Metabolic Panel: Recent Labs  Lab 10/24/19 1420 10/24/19 1720 10/25/19 0546  NA 137  --  136  K 4.0  --  4.7  CL 101  --  105  CO2 24  --  23  GLUCOSE 143*  --  130*  BUN 32*  --  35*  CREATININE 1.33*  --  0.89  CALCIUM 8.4*  --  8.2*  MG  --  2.1  --    GFR: Estimated Creatinine Clearance: 57.5 mL/min (by C-G formula based on SCr of 0.89 mg/dL). Liver Function Tests: Recent Labs  Lab 10/24/19 1420 10/25/19 0546  AST 50* 37  ALT 32 33  ALKPHOS 41 28*  BILITOT 4.6* 2.6*  PROT 7.3 6.4*  ALBUMIN 3.6 3.3*   No results for input(s): LIPASE, AMYLASE in the last 168 hours. No results for input(s): AMMONIA in the last 168 hours. Coagulation Profile: No results for input(s): INR, PROTIME in the last 168 hours. Cardiac Enzymes: No results for input(s): CKTOTAL, CKMB, CKMBINDEX, TROPONINI in the last 168 hours. BNP (last 3 results) No results for input(s): PROBNP in the last 8760 hours. HbA1C: No results for input(s): HGBA1C in the last 72 hours. CBG: No results for input(s): GLUCAP in the last 168 hours. Lipid Profile: No results for input(s): CHOL, HDL, LDLCALC, TRIG, CHOLHDL, LDLDIRECT in the last 72 hours. Thyroid Function Tests: No results for input(s): TSH, T4TOTAL, FREET4, T3FREE, THYROIDAB in the last 72 hours. Anemia Panel: No results for input(s): VITAMINB12, FOLATE, FERRITIN, TIBC, IRON, RETICCTPCT in the last 72 hours. Urine analysis:    Component Value Date/Time   COLORURINE AMBER (A)  10/24/2019 2020   APPEARANCEUR HAZY (A) 10/24/2019 2020   LABSPEC 1.017 10/24/2019 2020   PHURINE 5.0 10/24/2019 2020   GLUCOSEU 50 (A) 10/24/2019 2020   HGBUR SMALL (A) 10/24/2019 2020   BILIRUBINUR NEGATIVE 10/24/2019 2020   KETONESUR NEGATIVE 10/24/2019 2020   PROTEINUR NEGATIVE 10/24/2019 2020   UROBILINOGEN 0.2 01/11/2014 1200   NITRITE NEGATIVE 10/24/2019 2020   LEUKOCYTESUR SMALL (A) 10/24/2019 2020   Sepsis Labs: @LABRCNTIP (procalcitonin:4,lacticidven:4)  ) Recent Results (from the past 240 hour(s))  Urine culture     Status: Abnormal   Collection Time: 10/24/19  8:20 PM   Specimen: Urine, Clean Catch  Result Value Ref Range Status   Specimen Description   Final    URINE, CLEAN CATCH Performed at [facility], [place] [address] [place], [place] [address]    Special Requests   Final    NONE Performed at [facility], [place] [address] [place], [place] [address]    Culture MULTIPLE SPECIES PRESENT, SUGGEST RECOLLECTION (A)  Final   Report Status 10/26/2019 FINAL  Final  MRSA PCR Screening     Status: None   Collection Time: 10/25/19  3:39 PM   Specimen: Nasal Mucosa; Nasopharyngeal  Result Value Ref Range Status   MRSA by PCR NEGATIVE NEGATIVE Final    Comment:        The GeneXpert MRSA Assay (FDA approved for NASAL specimens only), is one component of a comprehensive MRSA colonization surveillance program. It is not intended to diagnose MRSA infection nor to guide or monitor treatment for MRSA infections. Performed at [facility], [place] [address] [place], [place] [address]       Studies: No results found.  Scheduled Meds: . feeding supplement (PRO-STAT SUGAR FREE 64)  30 mL Oral Daily  . fenofibrate  160 mg Oral Daily  . folic acid  5 mg Oral Daily  . metoprolol tartrate  25 mg Oral BID  . multivitamin with minerals  1 tablet Oral Daily  . predniSONE  60 mg Oral Q breakfast   Followed by  .  [START ON 11/02/2019] predniSONE  50 mg Oral Q breakfast  . rivaroxaban  20 mg Oral Q breakfast  . cyanocobalamin  1,000 mcg Oral Daily    Continuous Infusions: . diltiazem (CARDIZEM) infusion Stopped (10/25/19 1207)  . remdesivir 100 mg in NS 100 mL 100 mg (10/28/19 0937)     LOS: 4 days  Cristal Deer, MD [facility]  To reach me or the doctor on call, go to: [email or web address] Password [email or web address]  10/28/2019, 6:18 PM

## 2019-10-29 DIAGNOSIS — J9600 Acute respiratory failure, unspecified whether with hypoxia or hypercapnia: Secondary | ICD-10-CM

## 2019-10-29 DIAGNOSIS — U071 COVID-19: Secondary | ICD-10-CM

## 2019-10-29 MED ORDER — PREDNISONE 10 MG PO TABS: 10.0000 mg | ORAL_TABLET | Freq: Every day | ORAL | 0 refills | Status: DC

## 2019-10-29 MED ORDER — DEXAMETHASONE 6 MG PO TABS: 6.0000 mg | ORAL_TABLET | Freq: Every day | ORAL | 0 refills | Status: DC

## 2019-10-29 NOTE — Care Management Important Message (Signed)
Important Message  Patient Details IM Letter given to Evette Cristal to present to the Patient Name: Alexandra Pierce MRN: [id number] Date of Birth: 1945/12/29   Medicare Important Message Given:  Yes     Kerin Salen 10/29/2019, 11:19 AM

## 2019-10-29 NOTE — Discharge Instructions (Signed)
Patient discharged with COVID-19 positive test (U07.1, COVID-19) with Acute Pneumonia (J12.89, Other viral pneumonia) (If respiratory failure or sepsis present, add as separate assessment)  No requirement of oxygen Continue with Decadron 6 mg p.o. for 6 days Continue medication for atrial fibrillation Patient had an episode of rapid A. fib which resolved with Cardizem drip and increase of metoprolol p.o. Follow-up with primary doctor and cardiology for rate control

## 2019-10-29 NOTE — Discharge Summary (Signed)
Physician Discharge Summary  Alexandra Pierce [id number] DOB: 08-16-1946 DOA: 10/24/2019  PCP: Chesley Noon, MD  Admit date: 10/24/2019 Discharge date: 10/29/2019  Admitted From: Home  Disposition: Home   Recommendations for Outpatient Follow-up:  1. Follow up with PCP in 1-2 weeks 2. Please obtain BMP/CBC in one week 3. Home Health: No Equipment/Devices:   Discharge Condition: Stable) CODE STATUS: Full code Diet recommendation: cardiac  Brief/Interim Summary: HPI: Alexandra Pierce is a 74 y.o. female with history of A. fib and myelodysplastic syndrome with hemolytic anemia and history of stroke was recently admitted for anemia required IVIG and prednisone for hemolytic anemia had followed up with oncologist office yesterday and was found to be short of breath weak and was transferred to the ER.  Patient states over the last 3 days patient has become short of breath with productive cough subjective feeling of fever chills lightheadedness no chest pain has been examined diarrhea off and on.  Denies vomiting. By DR Jaquita Rector In the ED, patient was afebrile, hypotensive in 90s and mildly tachycardic. She was noted to be in A. fib with RVR. Blood pressure and heart rate improved with IV fluid and Cardizem drip. Chest x-ray unremarkable Covid screening antigen positive. Patient was admitted under hospitalist service for further evaluation and management. October 28, 2019 Subjective: Patient seen and examined at bedside she is doing much better she is off of oxygen and still saturating normal.  She has had her last dose of remdesivir tomorrow January 11.  Which she will complete I was going to see if she could go home and take the rest of her medication treatment outpatient but she stated she lives alone and that her daughter will be flying in tomorrow.  The patient will be discharged home tomorrow after her last dose of remdesivir  October 29, 2019 Patient is doing well on no  oxygen wants to go  home  remdesivir completed for 5 days Decadron 6 mg p.o. for 6 days for total 10 days of steroids Patient in the hospital was on prednisone taper Atrial fibrillation rate control Renal function improved Discharge Diagnoses:  Principal Problem:   Acute respiratory failure due to COVID-19 [facility]) Active Problems:   ARF (acute renal failure) (HCC)   Warm antibody hemolytic anemia   MDS (myelodysplastic syndrome) (HCC)   Atrial fibrillation with RVR ([employer])    Discharge Instructions Follow-up with primary doctor in 3 days Continue with dexamethasone for 6 days Resume home medication for atrial fibrillation Resume statin  Allergies as of 10/29/2019      Reactions   Sulfonamide Derivatives Anaphylaxis   Lipitor [atorvastatin] Other (See Comments)   Caused bladder infection   Pravachol [pravastatin Sodium] Other (See Comments)   Myalgias   Statins Other (See Comments)   Myalgia      Medication List    STOP taking these medications   doxycycline 100 MG tablet Commonly known as: VIBRA-TABS   predniSONE 10 MG tablet Commonly known as: DELTASONE     TAKE these medications   b complex vitamins capsule Take 1 capsule by mouth daily.   benzonatate 100 MG capsule Commonly known as: TESSALON Take 1 capsule (100 mg total) by mouth every 8 (eight) hours as needed for cough.   cholecalciferol 1000 units tablet Commonly known as: VITAMIN D Take 1,000 Units by mouth daily.   cyanocobalamin 1000 MCG tablet Take 1,000 mcg by mouth daily.   dexamethasone 6 MG tablet Commonly known as: DECADRON Take 1 tablet (  6 mg total) by mouth daily.   esomeprazole 40 MG capsule Commonly known as: NEXIUM Take 1 capsule (40 mg total) by mouth daily.   fenofibrate 160 MG tablet Take 1 tablet (160 mg total) by mouth daily.   fluticasone 50 MCG/ACT nasal spray Commonly known as: FLONASE Place 1 spray into both nostrils daily.   folic acid 1 MG tablet Commonly known as: FOLVITE Take 5  tablets (5 mg total) by mouth daily.   metoprolol tartrate 25 MG tablet Commonly known as: LOPRESSOR Take 0.5 tablets (12.5 mg total) by mouth 2 (two) times daily. What changed:   how much to take  when to take this   VITAMIN C PO Take 1 tablet by mouth daily.   Xarelto 20 MG Tabs tablet Generic drug: rivaroxaban TAKE 1 TABLET BY MOUTH ONCE DAILY What changed: how much to take       Allergies  Allergen Reactions  . Sulfonamide Derivatives Anaphylaxis  . Lipitor [Atorvastatin] Other (See Comments)    Caused bladder infection  . Pravachol [Pravastatin Sodium] Other (See Comments)    Myalgias  . Statins Other (See Comments)    Myalgia    Consultations:     Procedures/Studies: DG Chest Portable 1 View  Result Date: 10/24/2019 CLINICAL DATA:  Cough. EXAM: PORTABLE CHEST 1 VIEW COMPARISON:  October 10, 2019 FINDINGS: Mildly decreased lung volumes are seen likely secondary to suboptimal patient inspiration. There is no evidence of acute infiltrate, pleural effusion or pneumothorax. The heart size and mediastinal contours are within normal limits. Degenerative changes seen throughout the thoracic spine. IMPRESSION: No active disease. Electronically Signed   By: Virgina Norfolk M.D.   On: 10/24/2019 17:36   DG Chest Port 1 View  Result Date: 10/10/2019 CLINICAL DATA:  Shortness of breath for 1 week, atrial fibrillation, persistent dry cough, COVID-19 negative on 10/03/2019 EXAM: PORTABLE CHEST 1 VIEW COMPARISON:  Portable exam 1200 hours compared to 10/03/2019 FINDINGS: Normal heart size, mediastinal contours, and pulmonary vascularity. Lungs clear. No pleural effusion or pneumothorax. Bones appear demineralized. IMPRESSION: No acute abnormalities. Electronically Signed   By: Lavonia Dana M.D.   On: 10/10/2019 12:20   DG Chest Portable 1 View  Result Date: 10/03/2019 CLINICAL DATA:  Cough, shortness of breath EXAM: PORTABLE CHEST 1 VIEW COMPARISON:  04/11/2018 FINDINGS:  Heart and mediastinal contours are within normal limits. No focal opacities or effusions. No acute bony abnormality. IMPRESSION: No active disease. Electronically Signed   By: Rolm Baptise M.D.   On: 10/03/2019 17:44   ECHOCARDIOGRAM COMPLETE  Result Date: 10/11/2019   ECHOCARDIOGRAM REPORT   Patient Name:   Alexandra Pierce Date of Exam: 10/11/2019 Medical Rec #:  [id number]    Height:       66.0 in Accession #:    [id number]   Weight:       177.0 lb Date of Birth:  1946/09/02     BSA:          1.90 m Patient Age:    11 years     BP:           108/73 mmHg Patient Gender: F            HR:           111 bpm. Exam Location:  Inpatient Procedure: 2D Echo Indications:    dyspnea 786.09  History:        Patient has prior history of Echocardiogram examinations, most  recent 03/17/2016. Stroke; Risk Factors:Dyslipidemia and                 Hypertension. Paroxysmal a-fib.  Sonographer:    Jannett Celestine RDCS (AE) Referring Phys: [id number] [place]  1. Left ventricular ejection fraction, by visual estimation, is 60 to 65%. The left ventricle has normal function. There is no left ventricular hypertrophy.  2. Left ventricular diastolic function could not be evaluated.  3. The left ventricle has no regional wall motion abnormalities.  4. Global right ventricle has normal systolic function.The right ventricular size is normal. No increase in right ventricular wall thickness.  5. Left atrial size was severely dilated.  6. Right atrial size was normal.  7. Trivial pericardial effusion is present.  8. The mitral valve is normal in structure. Trivial mitral valve regurgitation. No evidence of mitral stenosis.  9. The tricuspid valve is normal in structure. 10. The aortic valve is normal in structure. Aortic valve regurgitation is not visualized. Mild aortic valve sclerosis without stenosis. 11. The pulmonic valve was grossly normal. Pulmonic valve regurgitation is not visualized. 12. TR signal is  inadequate for assessing pulmonary artery systolic pressure. 13. The inferior vena cava is dilated in size with <50% respiratory variability, suggesting right atrial pressure of 15 mmHg. FINDINGS  Left Ventricle: Left ventricular ejection fraction, by visual estimation, is 60 to 65%. The left ventricle has normal function. The left ventricle has no regional wall motion abnormalities. The left ventricular internal cavity size was the left ventricle is normal in size. There is no left ventricular hypertrophy. The left ventricular diastology could not be evaluated due to atrial fibrillation. Left ventricular diastolic function could not be evaluated. Right Ventricle: The right ventricular size is normal. No increase in right ventricular wall thickness. Global RV systolic function is has normal systolic function. Left Atrium: Left atrial size was severely dilated. Right Atrium: Right atrial size was normal in size Pericardium: Trivial pericardial effusion is present. Mitral Valve: The mitral valve is normal in structure. There is mild thickening of the mitral valve leaflet(s). Trivial mitral valve regurgitation. No evidence of mitral valve stenosis by observation. Tricuspid Valve: The tricuspid valve is normal in structure. Tricuspid valve regurgitation is trivial. Aortic Valve: The aortic valve is normal in structure. Aortic valve regurgitation is not visualized. Mild aortic valve sclerosis is present, with no evidence of aortic valve stenosis. Pulmonic Valve: The pulmonic valve was grossly normal. Pulmonic valve regurgitation is not visualized. Pulmonic regurgitation is not visualized. Aorta: The aortic root, ascending aorta and aortic arch are all structurally normal, with no evidence of dilitation or obstruction. Venous: The inferior vena cava is dilated in size with less than 50% respiratory variability, suggesting right atrial pressure of 15 mmHg. A roughly spherical 5 cm hypoechoic lesion is seen in the left lobe  of the liver, possibly a cyst. IAS/Shunts: No atrial level shunt detected by color flow Doppler. There is no evidence of a patent foramen ovale. No ventricular septal defect is seen or detected. There is no evidence of an atrial septal defect.  LEFT VENTRICLE PLAX 2D LVIDd:         4.60 cm LVIDs:         2.90 cm LV PW:         1.20 cm LV IVS:        1.00 cm LVOT diam:     1.90 cm LV SV:         65 ml LV SV  Index:   33.32 LVOT Area:     2.84 cm  RIGHT VENTRICLE RV S prime:     16.00 cm/s TAPSE (M-mode): 1.3 cm LEFT ATRIUM             Index LA diam:        4.50 cm 2.37 cm/m LA Vol (A2C):   70.6 ml 37.18 ml/m LA Vol (A4C):   78.9 ml 41.56 ml/m LA Biplane Vol: 75.3 ml 39.66 ml/m  AORTIC VALVE LVOT Vmax:   91.40 cm/s LVOT Vmean:  59.600 cm/s LVOT VTI:    0.175 m  AORTA Ao Root diam: 3.00 cm MITRAL VALVE MV Area (PHT): 4.06 cm            SHUNTS MV PHT:        54.23 msec          Systemic VTI:  0.18 m MV Decel Time: 187 msec            Systemic Diam: 1.90 cm MV E velocity: 98.50 cm/s 103 cm/s  Mihai Croitoru MD Electronically signed by Sanda Klein MD Signature Date/Time: 10/11/2019/11:34:43 AM    Final        Subjective: Patient doing well no complaints  Discharge Exam: Vitals:   10/28/19 2201 10/29/19 0634  BP: 100/65 (!) 95/59  Pulse: 89 87  Resp:  16  Temp:  97.8 F (36.6 C)  SpO2:  98%   Vitals:   10/28/19 1411 10/28/19 2114 10/28/19 2201 10/29/19 0634  BP: 104/73 96/76 100/65 (!) 95/59  Pulse: 87 71 89 87  Resp: 18 18  16   Temp: 98.1 F (36.7 C) 98 F (36.7 C)  97.8 F (36.6 C)  TempSrc: Oral Oral  Oral  SpO2: 96% 98%  98%  Weight:      Height:        General: Pt is alert, awake, not in acute distress Cardiovascular: RRR, S1/S2 +, no rubs, no gallops Respiratory: CTA bilaterally, no wheezing, no rhonchi Abdominal: Soft, NT, ND, bowel sounds + Extremities: no edema, no cyanosis    The results of significant diagnostics from this hospitalization (including imaging,  microbiology, ancillary and laboratory) are listed below for reference.     Microbiology: Recent Results (from the past 240 hour(s))  Urine culture     Status: Abnormal   Collection Time: 10/24/19  8:20 PM   Specimen: Urine, Clean Catch  Result Value Ref Range Status   Specimen Description   Final    URINE, CLEAN CATCH Performed at [facility], [place] [address], [place], [place] [address]    Special Requests   Final    NONE Performed at [facility], [place] [address] [place], [place] [address]    Culture MULTIPLE SPECIES PRESENT, SUGGEST RECOLLECTION (A)  Final   Report Status 10/26/2019 FINAL  Final  MRSA PCR Screening     Status: None   Collection Time: 10/25/19  3:39 PM   Specimen: Nasal Mucosa; Nasopharyngeal  Result Value Ref Range Status   MRSA by PCR NEGATIVE NEGATIVE Final    Comment:        The GeneXpert MRSA Assay (FDA approved for NASAL specimens only), is one component of a comprehensive MRSA colonization surveillance program. It is not intended to diagnose MRSA infection nor to guide or monitor treatment for MRSA infections. Performed at [facility], [place] [address], [place],  [address]      Labs: BNP (last 3 results) Recent  Labs    10/10/19 1154  BNP [id number]   Basic Metabolic Panel: Recent Labs  Lab 10/24/19 1420 10/24/19 1720 10/25/19 0546  NA 137  --  136  K 4.0  --  4.7  CL 101  --  105  CO2 24  --  23  GLUCOSE 143*  --  130*  BUN 32*  --  35*  CREATININE 1.33*  --  0.89  CALCIUM 8.4*  --  8.2*  MG  --  2.1  --    Liver Function Tests: Recent Labs  Lab 10/24/19 1420 10/25/19 0546  AST 50* 37  ALT 32 33  ALKPHOS 41 28*  BILITOT 4.6* 2.6*  PROT 7.3 6.4*  ALBUMIN 3.6 3.3*   No results for input(s): LIPASE, AMYLASE in the last 168 hours. No results for input(s): AMMONIA in the last 168 hours. CBC: Recent Labs  Lab 10/24/19 1420 10/25/19 0546 10/25/19 2031  WBC 6.5  3.1* 4.3  NEUTROABS 5.3 2.2  --   HGB 8.8* 7.0* 7.9*  HCT 25.7* 21.2* 24.2*  MCV 95.2 97.7 99.6  PLT 311 240 290   Cardiac Enzymes: No results for input(s): CKTOTAL, CKMB, CKMBINDEX, TROPONINI in the last 168 hours. BNP: Invalid input(s): POCBNP CBG: No results for input(s): GLUCAP in the last 168 hours. D-Dimer No results for input(s): DDIMER in the last 72 hours. Hgb A1c No results for input(s): HGBA1C in the last 72 hours. Lipid Profile No results for input(s): CHOL, HDL, LDLCALC, TRIG, CHOLHDL, LDLDIRECT in the last 72 hours. Thyroid function studies No results for input(s): TSH, T4TOTAL, T3FREE, THYROIDAB in the last 72 hours.  Invalid input(s): FREET3 Anemia work up No results for input(s): VITAMINB12, FOLATE, FERRITIN, TIBC, IRON, RETICCTPCT in the last 72 hours. Urinalysis    Component Value Date/Time   COLORURINE AMBER (A) 10/24/2019 2020   APPEARANCEUR HAZY (A) 10/24/2019 2020   LABSPEC 1.017 10/24/2019 2020   PHURINE 5.0 10/24/2019 2020   GLUCOSEU 50 (A) 10/24/2019 2020   HGBUR SMALL (A) 10/24/2019 2020   BILIRUBINUR NEGATIVE 10/24/2019 2020   KETONESUR NEGATIVE 10/24/2019 2020   PROTEINUR NEGATIVE 10/24/2019 2020   UROBILINOGEN 0.2 01/11/2014 1200   NITRITE NEGATIVE 10/24/2019 2020   LEUKOCYTESUR SMALL (A) 10/24/2019 2020   Sepsis Labs Invalid input(s): PROCALCITONIN,  WBC,  LACTICIDVEN Microbiology Recent Results (from the past 240 hour(s))  Urine culture     Status: Abnormal   Collection Time: 10/24/19  8:20 PM   Specimen: Urine, Clean Catch  Result Value Ref Range Status   Specimen Description   Final    URINE, CLEAN CATCH Performed at [facility], [place] [address], [place], [place] [address]    Special Requests   Final    NONE Performed at [facility], [place] [address], [place], [place] [address]    Culture MULTIPLE SPECIES PRESENT, SUGGEST RECOLLECTION (A)  Final   Report Status 10/26/2019 FINAL  Final   MRSA PCR Screening     Status: None   Collection Time: 10/25/19  3:39 PM   Specimen: Nasal Mucosa; Nasopharyngeal  Result Value Ref Range Status   MRSA by PCR NEGATIVE NEGATIVE Final    Comment:        The GeneXpert MRSA Assay (FDA approved for NASAL specimens only), is one component of a comprehensive MRSA colonization surveillance program. It is not intended to diagnose MRSA infection nor to guide or monitor treatment for MRSA infections. Performed at [facility], [address] [place], [place] [address]      Time coordinating discharge: Over 30 minutes  SIGNED:   Assunta Found, MD  [facility] 10/29/2019, 11:03 AM Pager   If 7PM-7AM, please contact night-coverage [email or web address] Password [email or web address]

## 2019-10-29 NOTE — Progress Notes (Signed)
Discharge instructions given to pt and all questions were answered.  

## 2019-11-01 ENCOUNTER — Telehealth: Payer: Self-pay | Admitting: Hematology

## 2019-11-01 NOTE — Telephone Encounter (Signed)
Scheduled appt per 11/4 sch message - unable to reach pt . Left message with appt date and time   

## 2019-11-04 ENCOUNTER — Other Ambulatory Visit: Payer: Self-pay | Admitting: Hematology

## 2019-11-05 ENCOUNTER — Other Ambulatory Visit: Payer: Self-pay

## 2019-11-05 ENCOUNTER — Inpatient Hospital Stay (HOSPITAL_COMMUNITY)
Admission: EM | Admit: 2019-11-05 | Discharge: 2019-11-08 | DRG: 308 | Disposition: A | Discharge status: Home / Self Care | Payer: Medicare Other | Attending: Internal Medicine | Admitting: Internal Medicine

## 2019-11-05 ENCOUNTER — Emergency Department (HOSPITAL_COMMUNITY): Payer: Medicare Other

## 2019-11-05 ENCOUNTER — Inpatient Hospital Stay (HOSPITAL_COMMUNITY): Payer: Medicare Other

## 2019-11-05 ENCOUNTER — Telehealth: Payer: Self-pay | Admitting: *Deleted

## 2019-11-05 ENCOUNTER — Encounter (HOSPITAL_COMMUNITY): Payer: Self-pay | Admitting: Emergency Medicine

## 2019-11-05 DIAGNOSIS — E7800 Pure hypercholesterolemia, unspecified: Secondary | ICD-10-CM | POA: Diagnosis present

## 2019-11-05 DIAGNOSIS — I10 Essential (primary) hypertension: Secondary | ICD-10-CM | POA: Diagnosis present

## 2019-11-05 DIAGNOSIS — Z79899 Other long term (current) drug therapy: Secondary | ICD-10-CM | POA: Diagnosis not present

## 2019-11-05 DIAGNOSIS — Z8249 Family history of ischemic heart disease and other diseases of the circulatory system: Secondary | ICD-10-CM

## 2019-11-05 DIAGNOSIS — Z825 Family history of asthma and other chronic lower respiratory diseases: Secondary | ICD-10-CM | POA: Diagnosis not present

## 2019-11-05 DIAGNOSIS — Z87891 Personal history of nicotine dependence: Secondary | ICD-10-CM | POA: Diagnosis not present

## 2019-11-05 DIAGNOSIS — I2510 Atherosclerotic heart disease of native coronary artery without angina pectoris: Secondary | ICD-10-CM | POA: Diagnosis present

## 2019-11-05 DIAGNOSIS — I25110 Atherosclerotic heart disease of native coronary artery with unstable angina pectoris: Secondary | ICD-10-CM | POA: Diagnosis present

## 2019-11-05 DIAGNOSIS — I480 Paroxysmal atrial fibrillation: Secondary | ICD-10-CM | POA: Diagnosis not present

## 2019-11-05 DIAGNOSIS — I4891 Unspecified atrial fibrillation: Secondary | ICD-10-CM | POA: Diagnosis not present

## 2019-11-05 DIAGNOSIS — M545 Low back pain: Secondary | ICD-10-CM | POA: Diagnosis present

## 2019-11-05 DIAGNOSIS — J1282 Pneumonia due to coronavirus disease 2019: Secondary | ICD-10-CM | POA: Diagnosis present

## 2019-11-05 DIAGNOSIS — E785 Hyperlipidemia, unspecified: Secondary | ICD-10-CM | POA: Diagnosis present

## 2019-11-05 DIAGNOSIS — Z7901 Long term (current) use of anticoagulants: Secondary | ICD-10-CM

## 2019-11-05 DIAGNOSIS — R600 Localized edema: Secondary | ICD-10-CM | POA: Diagnosis present

## 2019-11-05 DIAGNOSIS — Z833 Family history of diabetes mellitus: Secondary | ICD-10-CM | POA: Diagnosis not present

## 2019-11-05 DIAGNOSIS — R000 Tachycardia, unspecified: Secondary | ICD-10-CM

## 2019-11-05 DIAGNOSIS — G8929 Other chronic pain: Secondary | ICD-10-CM | POA: Diagnosis present

## 2019-11-05 DIAGNOSIS — D469 Myelodysplastic syndrome, unspecified: Secondary | ICD-10-CM | POA: Diagnosis present

## 2019-11-05 DIAGNOSIS — R0902 Hypoxemia: Secondary | ICD-10-CM | POA: Diagnosis present

## 2019-11-05 DIAGNOSIS — D461 Refractory anemia with ring sideroblasts: Secondary | ICD-10-CM | POA: Diagnosis present

## 2019-11-05 DIAGNOSIS — Z888 Allergy status to other drugs, medicaments and biological substances status: Secondary | ICD-10-CM

## 2019-11-05 DIAGNOSIS — Z8349 Family history of other endocrine, nutritional and metabolic diseases: Secondary | ICD-10-CM

## 2019-11-05 DIAGNOSIS — J189 Pneumonia, unspecified organism: Secondary | ICD-10-CM | POA: Diagnosis present

## 2019-11-05 DIAGNOSIS — Z882 Allergy status to sulfonamides status: Secondary | ICD-10-CM

## 2019-11-05 DIAGNOSIS — U071 COVID-19: Secondary | ICD-10-CM | POA: Diagnosis present

## 2019-11-05 DIAGNOSIS — Z8673 Personal history of transient ischemic attack (TIA), and cerebral infarction without residual deficits: Secondary | ICD-10-CM | POA: Diagnosis not present

## 2019-11-05 DIAGNOSIS — D649 Anemia, unspecified: Secondary | ICD-10-CM | POA: Diagnosis present

## 2019-11-05 DIAGNOSIS — R0602 Shortness of breath: Secondary | ICD-10-CM | POA: Diagnosis not present

## 2019-11-05 LAB — COMPREHENSIVE METABOLIC PANEL
ALT: 40 U/L (ref 0–44)
AST: 41 U/L (ref 15–41)
Albumin: 3.6 g/dL (ref 3.5–5.0)
Alkaline Phosphatase: 41 U/L (ref 38–126)
Anion gap: 9 (ref 5–15)
BUN: 22 mg/dL (ref 8–23)
CO2: 23 mmol/L (ref 22–32)
Calcium: 8.5 mg/dL — ABNORMAL LOW (ref 8.9–10.3)
Chloride: 104 mmol/L (ref 98–111)
Creatinine, Ser: 1.04 mg/dL — ABNORMAL HIGH (ref 0.44–1.00)
GFR calc Af Amer: >60 mL/min (ref >60)
GFR calc non Af Amer: 53 mL/min — ABNORMAL LOW (ref >60)
Glucose, Bld: 139 mg/dL — ABNORMAL HIGH (ref 70–99)
Potassium: 4.1 mmol/L (ref 3.5–5.1)
Sodium: 136 mmol/L (ref 135–145)
Total Bilirubin: 3.7 mg/dL — ABNORMAL HIGH (ref 0.3–1.2)
Total Protein: 6.5 g/dL (ref 6.5–8.1)

## 2019-11-05 LAB — CBC WITH DIFFERENTIAL/PLATELET
Abs Immature Granulocytes: 0.14 10*3/uL — ABNORMAL HIGH (ref 0.00–0.07)
Basophils Absolute: 0 10*3/uL (ref 0.0–0.1)
Basophils Relative: 1 %
Eosinophils Absolute: 0.2 10*3/uL (ref 0.0–0.5)
Eosinophils Relative: 2 %
HCT: 22.8 % — ABNORMAL LOW (ref 36.0–46.0)
Hemoglobin: 7.6 g/dL — ABNORMAL LOW (ref 12.0–15.0)
Immature Granulocytes: 2 %
Lymphocytes Relative: 31 %
Lymphs Abs: 2.1 10*3/uL (ref 0.7–4.0)
MCH: 33 pg (ref 26.0–34.0)
MCHC: 33.3 g/dL (ref 30.0–36.0)
MCV: 99.1 fL (ref 80.0–100.0)
Monocytes Absolute: 0.3 10*3/uL (ref 0.1–1.0)
Monocytes Relative: 5 %
Neutro Abs: 3.8 10*3/uL (ref 1.7–7.7)
Neutrophils Relative %: 59 %
Platelets: 260 10*3/uL (ref 150–400)
RBC: 2.3 MIL/uL — ABNORMAL LOW (ref 3.87–5.11)
RDW: 21.9 % — ABNORMAL HIGH (ref 11.5–15.5)
WBC: 6.5 10*3/uL (ref 4.0–10.5)
nRBC: 9 % — ABNORMAL HIGH (ref 0.0–0.2)

## 2019-11-05 LAB — PREPARE RBC (CROSSMATCH)

## 2019-11-05 LAB — APTT: aPTT: 30 seconds (ref 24–36)

## 2019-11-05 LAB — PROTIME-INR
INR: 2.2 — ABNORMAL HIGH (ref 0.8–1.2)
Prothrombin Time: 24.4 seconds — ABNORMAL HIGH (ref 11.4–15.2)

## 2019-11-05 LAB — TROPONIN I (HIGH SENSITIVITY)
Troponin I (High Sensitivity): 5 ng/L (ref <18)
Troponin I (High Sensitivity): 7 ng/L (ref <18)

## 2019-11-05 LAB — LACTIC ACID, PLASMA: Lactic Acid, Venous: 2 mmol/L (ref 0.5–1.9)

## 2019-11-05 LAB — BRAIN NATRIURETIC PEPTIDE: B Natriuretic Peptide: 104.9 pg/mL — ABNORMAL HIGH (ref 0.0–100.0)

## 2019-11-05 MED ORDER — VANCOMYCIN HCL 1500 MG/300ML IV SOLN
1500.0000 mg | Freq: Once | INTRAVENOUS | Status: AC
  Administered 2019-11-05: 19:00:00 1500 mg via INTRAVENOUS
  Filled 2019-11-05: qty 300

## 2019-11-05 MED ORDER — CHLORHEXIDINE GLUCONATE CLOTH 2 % EX PADS
6.0000 | MEDICATED_PAD | Freq: Every day | CUTANEOUS | Status: DC
  Administered 2019-11-06 – 2019-11-08 (×3): 6 via TOPICAL

## 2019-11-05 MED ORDER — VITAMIN B-12 1000 MCG PO TABS
1000.0000 ug | ORAL_TABLET | Freq: Every day | ORAL | Status: DC
  Administered 2019-11-06 – 2019-11-08 (×3): 1000 ug via ORAL
  Filled 2019-11-05 (×3): qty 1

## 2019-11-05 MED ORDER — ONDANSETRON HCL 4 MG/2ML IJ SOLN: 4.0000 mg | Freq: Four times a day (QID) | INTRAMUSCULAR | Status: DC | PRN

## 2019-11-05 MED ORDER — FENOFIBRATE 160 MG PO TABS
160.0000 mg | ORAL_TABLET | Freq: Every day | ORAL | Status: DC
  Administered 2019-11-06 – 2019-11-08 (×3): 160 mg via ORAL
  Filled 2019-11-05 (×4): qty 1

## 2019-11-05 MED ORDER — FLUTICASONE PROPIONATE 50 MCG/ACT NA SUSP
1.0000 | Freq: Every day | NASAL | Status: DC
  Administered 2019-11-06 – 2019-11-08 (×3): 1 via NASAL
  Filled 2019-11-05: qty 16

## 2019-11-05 MED ORDER — VANCOMYCIN HCL IN DEXTROSE 1-5 GM/200ML-% IV SOLN: 1000.0000 mg | Freq: Once | INTRAVENOUS | Status: DC

## 2019-11-05 MED ORDER — ASCORBIC ACID 500 MG PO TABS
500.0000 mg | ORAL_TABLET | Freq: Every day | ORAL | Status: DC
  Administered 2019-11-06 – 2019-11-08 (×3): 500 mg via ORAL
  Filled 2019-11-05 (×3): qty 1

## 2019-11-05 MED ORDER — RIVAROXABAN 20 MG PO TABS
20.0000 mg | ORAL_TABLET | Freq: Every day | ORAL | Status: DC
  Administered 2019-11-06 – 2019-11-08 (×3): 20 mg via ORAL
  Filled 2019-11-05 (×3): qty 1

## 2019-11-05 MED ORDER — SODIUM CHLORIDE 0.9% IV SOLUTION: Freq: Once | INTRAVENOUS | Status: DC

## 2019-11-05 MED ORDER — SODIUM CHLORIDE 0.9 % IV SOLN
2.0000 g | Freq: Once | INTRAVENOUS | Status: AC
  Administered 2019-11-05: 19:00:00 2 g via INTRAVENOUS
  Filled 2019-11-05: qty 2

## 2019-11-05 MED ORDER — PREDNISONE 10 MG PO TABS
60.0000 mg | ORAL_TABLET | Freq: Every day | ORAL | Status: DC
  Administered 2019-11-06: 60 mg via ORAL
  Filled 2019-11-05: qty 6

## 2019-11-05 MED ORDER — METOPROLOL TARTRATE 12.5 MG HALF TABLET
12.5000 mg | ORAL_TABLET | Freq: Two times a day (BID) | ORAL | Status: DC
  Administered 2019-11-06 – 2019-11-08 (×5): 12.5 mg via ORAL
  Filled 2019-11-05 (×5): qty 1

## 2019-11-05 MED ORDER — SODIUM CHLORIDE 0.9% IV SOLUTION: Freq: Once | INTRAVENOUS | Status: AC

## 2019-11-05 MED ORDER — VANCOMYCIN HCL IN DEXTROSE 1-5 GM/200ML-% IV SOLN
1000.0000 mg | INTRAVENOUS | Status: DC
  Administered 2019-11-06: 1000 mg via INTRAVENOUS
  Filled 2019-11-05: qty 200

## 2019-11-05 MED ORDER — ONDANSETRON HCL 4 MG PO TABS: 4.0000 mg | ORAL_TABLET | Freq: Four times a day (QID) | ORAL | Status: DC | PRN

## 2019-11-05 MED ORDER — BENZONATATE 100 MG PO CAPS
100.0000 mg | ORAL_CAPSULE | Freq: Three times a day (TID) | ORAL | Status: DC | PRN
  Administered 2019-11-05 – 2019-11-07 (×5): 100 mg via ORAL
  Filled 2019-11-05 (×5): qty 1

## 2019-11-05 MED ORDER — SODIUM CHLORIDE (PF) 0.9 % IJ SOLN
INTRAMUSCULAR | Status: AC
  Filled 2019-11-05: qty 50

## 2019-11-05 MED ORDER — FOLIC ACID 1 MG PO TABS
2.0000 mg | ORAL_TABLET | Freq: Every day | ORAL | Status: DC
  Administered 2019-11-06 – 2019-11-08 (×3): 2 mg via ORAL
  Filled 2019-11-05 (×3): qty 2

## 2019-11-05 MED ORDER — IOHEXOL 350 MG/ML SOLN
100.0000 mL | Freq: Once | INTRAVENOUS | Status: AC | PRN
  Administered 2019-11-05: 20:00:00 100 mL via INTRAVENOUS

## 2019-11-05 MED ORDER — SODIUM CHLORIDE 0.9 % IV SOLN
2.0000 g | Freq: Two times a day (BID) | INTRAVENOUS | Status: DC
  Administered 2019-11-06 (×2): 2 g via INTRAVENOUS
  Filled 2019-11-05 (×2): qty 2

## 2019-11-05 MED ORDER — VITAMIN D 25 MCG (1000 UNIT) PO TABS
1000.0000 [IU] | ORAL_TABLET | Freq: Every day | ORAL | Status: DC
  Administered 2019-11-06 – 2019-11-08 (×3): 1000 [IU] via ORAL
  Filled 2019-11-05 (×3): qty 1

## 2019-11-05 MED ORDER — B COMPLEX-C PO TABS
1.0000 | ORAL_TABLET | Freq: Every day | ORAL | Status: DC
  Administered 2019-11-06 – 2019-11-08 (×3): 1 via ORAL
  Filled 2019-11-05 (×3): qty 1

## 2019-11-05 NOTE — ED Notes (Signed)
Date and time results received: 11/05/19 5:16 PM    Test: lactic acid Critical Value: 2.0  Name of Provider Notified: Sophia PA  Orders Received? Or Actions Taken?: acknowledges result

## 2019-11-05 NOTE — ED Notes (Signed)
This RN attempted to call report [phone]

## 2019-11-05 NOTE — ED Provider Notes (Signed)
[facility] DEPT Provider Note   CSN: [id number] Arrival date & time: 11/05/19  1248     History Chief Complaint  Patient presents with  . Shortness of Breath    Alexandra Pierce is a 74 y.o. female presenting for evaluation of shortness of breath and weakness.  Patient states she has had increased shortness of breath, weakness, and pallor starting yesterday.  Patient was admitted to the hospital for anemia requiring blood transfusion 3 weeks ago.  A week later she was admitted to the hospital with Covid.  She was discharged 7 days ago, states that her symptoms never completely resolved.  She started to feel worse yesterday.  Patient states she feels like how she feels when she is anemic.  She reports she is short of breath, this improved with oxygen.  She reports generalized weakness and she feels pale.  Patient has frequent episodes of anemia requiring blood transfusion due to her myelodysplastic syndrome.  Patient remains on Xarelto due to her A. fib.  She denies hematochezia, melena, or hematemesis.  She denies recent fevers, chills, chest pain, nausea, vomiting, abd pain, urinary symptoms, normal bowel movements.  HPI     Past Medical History:  Diagnosis Date  . Chronic lower back pain   . Hypercholesteremia   . Hypertension    mild  . Obesity   . Paroxysmal atrial fibrillation (HCC)    managed with anticoagulation and rate control.   . Stroke [facility] 2001 & 2015   "right side gets weak sometimes if I'm only tired" (03/16/2016)    Patient Active Problem List   Diagnosis Date Noted  . Atrial fibrillation with rapid ventricular response ([place]) 11/05/2019  . Healthcare-associated pneumonia 11/05/2019  . Edema of both legs 11/05/2019  . Atrial fibrillation with RVR ([place]) 10/25/2019  . Acute respiratory failure due to COVID-19 ([place]) 10/24/2019  . Anemia 10/10/2019  . MDS (myelodysplastic syndrome) ([place]) 09/26/2019  . 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 Counseling regarding advance care planning and goals of care 08/16/2019  . Elevated serum lactate dehydrogenase (LDH)   . Renal lesion   . Syncope 05/22/2018  . Symptomatic anemia 05/22/2018  . Unstable angina ([place])   . Chest pain 03/16/2016  . ARF (acute renal failure) ([place]) 03/16/2016  . Coronary artery disease involving native coronary artery of native heart with unstable angina pectoris ([place])   . Acute CVA (cerebrovascular accident) ([place]) 01/12/2014  . TIA (transient ischemic attack) 01/11/2014  . COLONIC POLYPS 12/15/2007  . Hyperlipidemia 12/15/2007  . INTERNAL HEMORRHOIDS WITHOUT MENTION COMP 12/15/2007  . SINUSITIS, CHRONIC 12/15/2007  . CONSTIPATION 12/15/2007  . RECTAL BLEEDING 12/15/2007  . OSTEOARTHRITIS 12/15/2007  . Essential hypertension 07/18/2007  . Atrial fibrillation ([place]) 07/18/2007  . History of cardiovascular disorder 07/18/2007    Past Surgical History:  Procedure Laterality Date  . CARDIAC CATHETERIZATION  03/31/2000   EF 49%/LAD lg vessel which coursed to the apex & gave rise to 2 diagonal branches/ LAD noted to have 30% ostial lesion & tapers to a sm vessel toward the apex but no high grade lesion/1st diagonal med sized vessel & 2nd diagonal sm vessel with no significant disease/ lt ventriculogram reveals mild global hypokinesis w/ an estimated EF of 45-50%  . CARDIAC CATHETERIZATION N/A 03/17/2016   Procedure: Left Heart Cath and Coronary Angiography;  Surgeon: Wellington Hampshire, MD;  Location: [facility];  Service: Cardiovascular;  Laterality: N/A;  . LEFT HEART CATH AND CORONARY ANGIOGRAPHY  N/A 03/31/2018   Procedure: LEFT HEART CATH AND CORONARY ANGIOGRAPHY;  Surgeon: Martinique, Peter M, MD;  Location: [facility];  Service: Cardiovascular;  Laterality: N/A;     OB History   No obstetric history on file.     Family History  Problem Relation Age of Onset  . Heart disease Father   . Asthma Father   . Diabetes Father   .  Heart disease Mother   . Heart disease Brother   . Hyperlipidemia Sister   . Hyperlipidemia Brother   . Hyperlipidemia Brother   . Cancer Neg Hx     Social History   Tobacco Use  . Smoking status: Former Smoker    Packs/day: 0.12    Years: 15.00    Pack years: 1.80    Types: Cigarettes    Quit date: 10/18/1978    Years since quitting: 41.0  . Smokeless tobacco: Never Used  Substance Use Topics  . Alcohol use: Yes    Alcohol/week: 14.0 standard drinks    Types: 14 Glasses of wine per week  . Drug use: No    Home Medications Prior to Admission medications   Medication Sig Start Date End Date Taking? Authorizing Provider  Ascorbic Acid (VITAMIN C PO) Take 1 tablet by mouth daily.   Yes [provider]  b complex vitamins capsule Take 1 capsule by mouth daily.    Yes [provider]  benzonatate (TESSALON) 100 MG capsule Take 1 capsule (100 mg total) by mouth every 8 (eight) hours as needed for cough. 10/03/19  Yes Corena Herter, PA-C  cholecalciferol (VITAMIN D) 1000 UNITS tablet Take 1,000 Units by mouth daily.   Yes [provider]  cyanocobalamin 1000 MCG tablet Take 1,000 mcg by mouth daily.    Yes [provider]  fenofibrate 160 MG tablet Take 1 tablet (160 mg total) by mouth daily. 02/16/19  Yes Burtis Junes, NP  folic acid (FOLVITE) 1 MG tablet Take 5 tablets (5 mg total) by mouth daily. Patient taking differently: Take 2 mg by mouth daily.  10/13/19  Yes Alma Friendly, MD  metoprolol tartrate (LOPRESSOR) 25 MG tablet Take 0.5 tablets (12.5 mg total) by mouth 2 (two) times daily. 04/04/19  Yes Burtis Junes, NP  predniSONE (DELTASONE) 10 MG tablet Take 8 tabs daily for 4 days, then 7 tabs daily for 7 days, then 6 tabs daily for 7 days, then 5 tabs daily for 7 days, then 4 tabs daily till complete 10/13/19  Yes Ezenduka, Adline Peals, MD  XARELTO 20 MG TABS tablet TAKE 1 TABLET BY MOUTH ONCE DAILY Patient taking differently:  Take 20 mg by mouth daily.  04/18/19  Yes Martinique, Peter M, MD  esomeprazole (NEXIUM) 40 MG capsule TAKE 1 CAPSULE(40 MG) BY MOUTH DAILY BEFORE BREAKFAST Patient not taking: Reported on 11/05/2019 11/05/19   Brunetta Genera, MD  fluticasone [facility]) 50 MCG/ACT nasal spray Place 1 spray into both nostrils daily.    [provider]  predniSONE (DELTASONE) 10 MG tablet Take 1 tablet (10 mg total) by mouth daily. 10/29/19   Cristescu, Linard Millers, MD    Allergies    Sulfonamide derivatives, Lipitor [atorvastatin], Pravachol [pravastatin sodium], and Statins  Review of Systems   Review of Systems  Respiratory: Positive for shortness of breath.   Skin: Positive for pallor.  Neurological: Positive for weakness.  All other systems reviewed and are negative.   Physical Exam Updated Vital Signs BP 105/65  Pulse (!) 118   Temp 98.5 F (36.9 C) (Oral)   Resp (!) 21   Ht 5\' 6"  (1.676 m)   Wt 77.1 kg   SpO2 99%   BMI 27.44 kg/m   Physical Exam Vitals and nursing note reviewed.  Constitutional:      General: She is not in acute distress.    Appearance: She is well-developed. She is ill-appearing.     Comments: Appears ill, but not in distress  HENT:     Head: Normocephalic and atraumatic.  Eyes:     Extraocular Movements: Extraocular movements intact.     Conjunctiva/sclera: Conjunctivae normal.     Pupils: Pupils are equal, round, and reactive to light.  Cardiovascular:     Rate and Rhythm: Regular rhythm. Tachycardia present.     Pulses: Normal pulses.     Comments: Tachycardic between 120 and 130 Pulmonary:     Effort: Pulmonary effort is normal. No respiratory distress.     Breath sounds: Normal breath sounds. No wheezing.     Comments: On 2 L via nasal cannula.  In no respiratory distress. Abdominal:     General: There is no distension.     Palpations: Abdomen is soft. There is no mass.     Tenderness: There is no abdominal tenderness. There is no guarding or  rebound.  Musculoskeletal:        General: Normal range of motion.     Cervical back: Normal range of motion and neck supple.     Right lower leg: Edema present.     Left lower leg: Edema present.     Comments: 2+ pitting edema of bilateral lower extremities.  Pedal pulses intact  Skin:    General: Skin is warm and dry.     Capillary Refill: Capillary refill takes less than 2 seconds.     Coloration: Skin is pale.  Neurological:     Mental Status: She is alert and oriented to person, place, and time.     ED Results / Procedures / Treatments   Labs (all labs ordered are listed, but only abnormal results are displayed) Labs Reviewed  CBC WITH DIFFERENTIAL/PLATELET - Abnormal; Notable for the following components:      Result Value   RBC 2.30 (*)    Hemoglobin 7.6 (*)    HCT 22.8 (*)    RDW 21.9 (*)    nRBC 9.0 (*)    Abs Immature Granulocytes 0.14 (*)    All other components within normal limits  COMPREHENSIVE METABOLIC PANEL - Abnormal; Notable for the following components:   Glucose, Bld 139 (*)    Creatinine, Ser 1.04 (*)    Calcium 8.5 (*)    Total Bilirubin 3.7 (*)    GFR calc non Af Amer 53 (*)    All other components within normal limits  LACTIC ACID, PLASMA - Abnormal; Notable for the following components:   Lactic Acid, Venous 2.0 (*)    All other components within normal limits  BRAIN NATRIURETIC PEPTIDE - Abnormal; Notable for the following components:   B Natriuretic Peptide 104.9 (*)    All other components within normal limits  PROTIME-INR - Abnormal; Notable for the following components:   Prothrombin Time 24.4 (*)    INR 2.2 (*)    All other components within normal limits  CULTURE, BLOOD (ROUTINE X 2)  CULTURE, BLOOD (ROUTINE X 2)  URINE CULTURE  MRSA PCR SCREENING  APTT  LACTIC ACID, PLASMA  URINALYSIS, ROUTINE  W REFLEX MICROSCOPIC  COMPREHENSIVE METABOLIC PANEL  CBC  TSH  TYPE AND SCREEN  PREPARE RBC (CROSSMATCH)  TROPONIN I (HIGH  SENSITIVITY)  TROPONIN I (HIGH SENSITIVITY)    EKG None  Radiology CT Angio Chest PE W and/or Wo Contrast  Result Date: 11/05/2019 CLINICAL DATA:  Shortness of breath, sepsis EXAM: CT ANGIOGRAPHY CHEST WITH CONTRAST TECHNIQUE: Multidetector CT imaging of the chest was performed using the standard protocol during bolus administration of intravenous contrast. Multiplanar CT image reconstructions and MIPs were obtained to evaluate the vascular anatomy. CONTRAST:  170mL OMNIPAQUE IOHEXOL 350 MG/ML SOLN COMPARISON:  2019 FINDINGS: Cardiovascular: Satisfactory opacification of the pulmonary arteries to the proximal segmental level. There is suboptimal evaluation particularly at the lung bases due to motion artifact. No evidence of pulmonary embolism. Cardiomegaly. No pericardial effusion. Coronary artery calcification. Mild calcification along the thoracic aorta. Mediastinum/Nodes: No mediastinal, hilar, or axillary adenopathy. Lungs/Pleura: There are patchy peripheral opacities. No pleural effusion or pneumothorax. Upper Abdomen: No acute abnormality. Hepatic cysts and mild splenomegaly are again noted. Musculoskeletal: No acute abnormality. Review of the MIP images confirms the above findings. IMPRESSION: No evidence of acute pulmonary embolism. Patchy peripheral pulmonary opacities, which may reflect atelectasis or may reflect residual findings of COVID-19 pneumonia, noting recent hospitalization. Cardiomegaly. Electronically Signed   By: Macy Mis M.D.   On: 11/05/2019 19:49   DG Chest Portable 1 View  Result Date: 11/05/2019 CLINICAL DATA:  Shortness of breath since yesterday, diagnosed with COVID 2 weeks ago, hypertension, paroxysmal atrial fibrillation EXAM: PORTABLE CHEST 1 VIEW COMPARISON:  Portable exam 1335 hours compared to 10/24/2019 FINDINGS: Upper normal heart size. Mediastinal contours and pulmonary vascularity normal. Minimal patchy infiltrate at the lateral lower lungs bilaterally  question pneumonia. Remaining lungs clear. Central peribronchial thickening. No pleural effusion or pneumothorax. IMPRESSION: Bronchitic changes with minimal patchy infiltrate in the periphery of the lower lungs question pneumonia. Electronically Signed   By: Lavonia Dana M.D.   On: 11/05/2019 14:31    Procedures .Critical Care Performed by: Franchot Heidelberg, PA-C Authorized by: Franchot Heidelberg, PA-C   Critical care provider statement:    Critical care time (minutes):  45   Critical care time was exclusive of:  Separately billable procedures and treating other patients and teaching time   Critical care was necessary to treat or prevent imminent or life-threatening deterioration of the following conditions:  Sepsis   Critical care was time spent personally by me on the following activities:  Blood draw for specimens, development of treatment plan with patient or surrogate, evaluation of patient's response to treatment, examination of patient, obtaining history from patient or surrogate, ordering and performing treatments and interventions, ordering and review of laboratory studies, ordering and review of radiographic studies, pulse oximetry, review of old charts and re-evaluation of patient's condition   I assumed direction of critical care for this patient from another provider in my specialty: no   Comments:     Pt with tachycardia, pneumonia, and lactic. Requiring admission and IV antibiotics.    (including critical care time)  Medications Ordered in ED Medications  0.9 %  sodium chloride infusion (Manually program via Guardrails IV Fluids) (has no administration in time range)  0.9 %  sodium chloride infusion (Manually program via Guardrails IV Fluids) (has no administration in time range)  cholecalciferol (VITAMIN D3) tablet 1,000 Units (has no administration in time range)  fenofibrate tablet 160 mg (has no administration in time range)  metoprolol tartrate (LOPRESSOR) tablet 12.5 mg  (  has no administration in time range)  rivaroxaban (XARELTO) tablet 20 mg (has no administration in time range)  benzonatate (TESSALON) capsule 100 mg (100 mg Oral Given 11/05/19 2319)  vitamin B-12 (CYANOCOBALAMIN) tablet 1,000 mcg (has no administration in time range)  B-complex with vitamin C tablet 1 tablet (has no administration in time range)  ascorbic acid (VITAMIN C) tablet 500 mg (has no administration in time range)  fluticasone (FLONASE) 50 MCG/ACT nasal spray 1 spray (has no administration in time range)  predniSONE (DELTASONE) tablet 10 mg (has no administration in time range)  folic acid (FOLVITE) tablet 2 mg (has no administration in time range)  ondansetron (ZOFRAN) tablet 4 mg (has no administration in time range)    Or  ondansetron (ZOFRAN) injection 4 mg (has no administration in time range)  vancomycin (VANCOCIN) IVPB 1000 mg/200 mL premix (has no administration in time range)  ceFEPIme (MAXIPIME) 2 g in sodium chloride 0.9 % 100 mL IVPB (has no administration in time range)  sodium chloride (PF) 0.9 % injection (has no administration in time range)  Chlorhexidine Gluconate Cloth 2 % PADS 6 each (has no administration in time range)  ceFEPIme (MAXIPIME) 2 g in sodium chloride 0.9 % 100 mL IVPB (0 g Intravenous Stopped 11/05/19 1917)  vancomycin (VANCOREADY) IVPB 1500 mg/300 mL (0 mg Intravenous Stopped 11/05/19 2049)  iohexol (OMNIPAQUE) 350 MG/ML injection 100 mL (100 mLs Intravenous Contrast Given 11/05/19 1941)    ED Course  I have reviewed the triage vital signs and the nursing notes.  Pertinent labs & imaging results that were available during my care of the patient were reviewed by me and considered in my medical decision making (see chart for details).    MDM Rules/Calculators/A&P                      Patient presenting for evaluation of shortness of breath, weakness, pallor.  She is concerned about anemia.  Additionally, patient was recently hospitalized with  Covid.  She is on blood thinner, as such low suspicion for PE.  Chest x-ray obtained from waiting room shows bilateral lower lobe opacities concerning for possible pneumonia.  Patient is tachycardic, but otherwise appears stable.  Blood pressure stable.  She does have new lower extremity pitting edema, as such consider new onset CHF.  Will obtain BNP.  Obtain troponin, labs, EKG.  If hemoglobin is low, consider infection/sepsis as cause for tachycardia.   Hemoglobin close to patient's baseline at 7.6.  Baseline of 8.  It was 7.9 a week and a half ago.  As such, I do not believe she needs emergent blood transfusion.  No leukocytosis.  Electrolytes stable.  Lactic mildly elevated at 2.  As such, will call sepsis due to her persistent tachycardia, mildly elevated lactic, and pneumonia on x-ray.  BNP mildly elevated at 100, consider mild heart failure. Case discussed with attending, Dr. Roderic Palau evaluated the patient.  Will hold on fluid administration as blood pressure is stable and I am concerned about new onset heart failure.  Discussed with Dr. Jonelle Sidle from [facility], patient to be admitted. Requesting PE study due to persistent tachycardia and blood transfusion for sx improvement.   Final Clinical Impression(s) / ED Diagnoses Final diagnoses:  Pneumonia of both lower lobes due to infectious organism  Tachycardia    Rx / DC Orders ED Discharge Orders    None       Franchot Heidelberg, PA-C 11/05/19 2321  Milton Ferguson, MD 11/07/19 [phone]

## 2019-11-05 NOTE — Progress Notes (Signed)
Pharmacy Antibiotic Note  Alexandra Pierce is a 74 y.o. female admitted on 11/05/2019 with sepsis.  Pharmacy has been consulted for vancomycin and cefepime dosing.  Pt was recently hospitalized with COVID-19 PNA, received a course of remdesivir, and was discharged on 1/11. Pt presents to ED on 1/18 with complaints of cough, weakness. Broad spectrum antibiotics being initiated be admitting physician for sepsis.   Today, 11/05/19 -WBC WNL -Lactate 2 -SCr 1.04, CrCl ~50 mL/min -Afebrile  Plan:  Cefepime 2 g IV q12h  Vancomycin 1500 mg LD followed by 1000 mg IV q24h  Goal vancomycin AUC 400-550. Check vancomycin levels once at steady state if indicated  If suspecting respiratory source of infection, recommend checking MRSA PCR.   Follow renal function and culture data  Height: 5\' 6"  (167.6 cm) Weight: 170 lb (77.1 kg) IBW/kg (Calculated) : 59.3  Temp (24hrs), Avg:98.5 F (36.9 C), Min:98.5 F (36.9 C), Max:98.5 F (36.9 C)  Recent Labs  Lab 11/05/19 1630 11/05/19 1635  WBC 6.5  --   CREATININE 1.04*  --   LATICACIDVEN  --  2.0*    Estimated Creatinine Clearance: 49.7 mL/min (A) (by C-G formula based on SCr of 1.04 mg/dL (H)).    Allergies  Allergen Reactions  . Sulfonamide Derivatives Anaphylaxis  . Lipitor [Atorvastatin] Other (See Comments)    Caused bladder infection  . Pravachol [Pravastatin Sodium] Other (See Comments)    Myalgias  . Statins Other (See Comments)    Myalgia    Antimicrobials this admission: cefepime 1/18 >>  vancomycin 1/18 >>   Previous admission: remdesivir 1/7 >> 1/11  Dose adjustments this admission:  Microbiology results: 1/18 BCx: Sent 1/18 UCx: Sent   Thank you for allowing pharmacy to be a part of this patient's care.  Lenis Noon, PharmD 11/05/2019 6:51 PM

## 2019-11-05 NOTE — ED Notes (Signed)
Noted that the patient was in atrial fib 155-172. Charge nurse notified for a room stat.

## 2019-11-05 NOTE — H&P (Signed)
History and Physical   Alexandra Pierce [id number] DOB: [birth year] DOA: 11/05/2019  Referring MD/NP/PA: Dr. Roderic Palau  PCP: Chesley Noon, MD   Outpatient Specialists: None  Patient coming from: Home  Chief Complaint: Shortness of breath  HPI: Alexandra Pierce is a 74 y.o. female with medical history significant of paroxysmal atrial fibrillation, hypertension, hyperlipidemia, history of CVA, myelodysplastic syndrome with hemolytic anemia now was admitted on January 6 with symptomatic anemia requiring IVIG prednisone and was found to have COVID-19 infection. Patient also had A. fib with RVR at the time. Patient treated and discharged home after treatment with dexamethasone and antibiotics. She was not requiring oxygen at the time. She had completed remdesivir for 5 days. She was on tapered dose of prednisone at discharge. She came back today due to worsening shortness of breath cough and fever. Symptoms apparently started yesterday with weakness. She also felt dizzy. The shortness of breath started getting worse. She has been on Xarelto but noted to have hemoglobin 7.6. Baseline is around 8 g. She also had no evidence of melena hematemesis or hematochezia. No other significant symptoms but patient was found to be hypoxic now requiring oxygen. CT angiogram showed no PE but mainly patchy peripheral pulmonary opacities residual for Covid pneumonia. Patient being admitted to the hospital for further treatment.  ED Course: Temperature is 98.8 blood pressure 93/56 pulse 135 respiratory rate of 28 oxygen sats 94% on room air. White count 6.5 hemoglobin 7.6 platelets 260. Chemistry relatively normal except for creatinine 1.04 calcium 8.5. CT angiogram showed no acute findings. Lactic acid 2.0. INR is 2.2 and patient is fully anticoagulated. Urinalysis showed large leukocytes. WBC 21-50 but no bacteria seen. Urine and blood cultures collected and patient is being admitted for treatment.  Review of Systems: As per  HPI otherwise 10 point review of systems negative.    Past Medical History:  Diagnosis Date  . Chronic lower back pain   . Hypercholesteremia   . Hypertension    mild  . Obesity   . Paroxysmal atrial fibrillation (HCC)    managed with anticoagulation and rate control.   . Stroke [facility]) 2001 & 2015   "right side gets weak sometimes if I'm only tired" (03/16/2016)    Past Surgical History:  Procedure Laterality Date  . CARDIAC CATHETERIZATION  03/31/2000   EF 49%/LAD lg vessel which coursed to the apex & gave rise to 2 diagonal branches/ LAD noted to have 30% ostial lesion & tapers to a sm vessel toward the apex but no high grade lesion/1st diagonal med sized vessel & 2nd diagonal sm vessel with no significant disease/ lt ventriculogram reveals mild global hypokinesis w/ an estimated EF of 45-50%  . CARDIAC CATHETERIZATION N/A 03/17/2016   Procedure: Left Heart Cath and Coronary Angiography;  Surgeon: Wellington Hampshire, MD;  Location: [place] [facility];  Service: Cardiovascular;  Laterality: N/A;  . LEFT HEART CATH AND CORONARY ANGIOGRAPHY N/A 03/31/2018   Procedure: LEFT HEART CATH AND CORONARY ANGIOGRAPHY;  Surgeon: Martinique, Peter M, MD;  Location: [place] [facility];  Service: Cardiovascular;  Laterality: N/A;     reports that she quit smoking about 41 years ago. Her smoking use included cigarettes. She has a 1.80 pack-year smoking history. She has never used smokeless tobacco. She reports current alcohol use of about 14.0 standard drinks of alcohol per week. She reports that she does not use drugs.  Allergies  Allergen Reactions  . Sulfonamide Derivatives Anaphylaxis  . Lipitor [Atorvastatin]  Other (See Comments)    Caused bladder infection  . Pravachol [Pravastatin Sodium] Other (See Comments)    Myalgias  . Statins Other (See Comments)    Myalgia    Family History  Problem Relation Age of Onset  . Heart disease Father   . Asthma Father   . Diabetes Father   . Heart disease  Mother   . Heart disease Brother   . Hyperlipidemia Sister   . Hyperlipidemia Brother   . Hyperlipidemia Brother   . Cancer Neg Hx      Prior to Admission medications   Medication Sig Start Date End Date Taking? Authorizing Provider  Ascorbic Acid (VITAMIN C PO) Take 1 tablet by mouth daily.   Yes [provider]  b complex vitamins capsule Take 1 capsule by mouth daily.    Yes [provider]  benzonatate (TESSALON) 100 MG capsule Take 1 capsule (100 mg total) by mouth every 8 (eight) hours as needed for cough. 10/03/19  Yes Corena Herter, PA-C  cholecalciferol (VITAMIN D) 1000 UNITS tablet Take 1,000 Units by mouth daily.   Yes [provider]  cyanocobalamin 1000 MCG tablet Take 1,000 mcg by mouth daily.    Yes [provider]  fenofibrate 160 MG tablet Take 1 tablet (160 mg total) by mouth daily. 02/16/19  Yes Burtis Junes, NP  folic acid (FOLVITE) 1 MG tablet Take 5 tablets (5 mg total) by mouth daily. Patient taking differently: Take 2 mg by mouth daily.  10/13/19  Yes Alma Friendly, MD  metoprolol tartrate (LOPRESSOR) 25 MG tablet Take 0.5 tablets (12.5 mg total) by mouth 2 (two) times daily. 04/04/19  Yes Burtis Junes, NP  predniSONE (DELTASONE) 10 MG tablet Take 8 tabs daily for 4 days, then 7 tabs daily for 7 days, then 6 tabs daily for 7 days, then 5 tabs daily for 7 days, then 4 tabs daily till complete 10/13/19  Yes Ezenduka, Adline Peals, MD  XARELTO 20 MG TABS tablet TAKE 1 TABLET BY MOUTH ONCE DAILY Patient taking differently: Take 20 mg by mouth daily.  04/18/19  Yes Martinique, Peter M, MD  esomeprazole (NEXIUM) 40 MG capsule TAKE 1 CAPSULE(40 MG) BY MOUTH DAILY BEFORE BREAKFAST Patient not taking: Reported on 11/05/2019 11/05/19   Brunetta Genera, MD  fluticasone [facility]) 50 MCG/ACT nasal spray Place 1 spray into both nostrils daily.    [provider]  predniSONE (DELTASONE) 10 MG tablet Take 1 tablet (10 mg total)  by mouth daily. 10/29/19   Cristescu, Linard Millers, MD    Physical Exam: Vitals:   11/05/19 1635 11/05/19 1700 11/05/19 1730 11/05/19 1800  BP:  100/61 101/61 (!) 101/59  Pulse:  (!) 119 (!) 121 (!) 115  Resp:  19 18 (!) 22  Temp:      TempSrc:      SpO2:  100% 100% 99%  Weight: 77.1 kg     Height: 5\' 6"  (1.676 m)         Constitutional: Acutely ill looking, weak, no acute distress Vitals:   11/05/19 1635 11/05/19 1700 11/05/19 1730 11/05/19 1800  BP:  100/61 101/61 (!) 101/59  Pulse:  (!) 119 (!) 121 (!) 115  Resp:  19 18 (!) 22  Temp:      TempSrc:      SpO2:  100% 100% 99%  Weight: 77.1 kg     Height: 5\' 6"  (1.676 m)      Eyes: PERRL, lids  and conjunctivae pale ENMT: Mucous membranes are dry. Posterior pharynx clear of any exudate or lesions.Normal dentition.  Neck: normal, supple, no masses, no thyromegaly Respiratory: Coarse breath sounds bilaterally, no wheezing, no crackles. Normal respiratory effort. No accessory muscle use.  Cardiovascular: Irregularly irregular with tachycardia no murmurs / rubs / gallops. No extremity edema. 2+ pedal pulses. No carotid bruits.  Abdomen: no tenderness, no masses palpated. No hepatosplenomegaly. Bowel sounds positive.  Musculoskeletal: no clubbing / cyanosis. No joint deformity upper and lower extremities. Good ROM, no contractures. Normal muscle tone.  Skin: no rashes, lesions, ulcers. No induration Neurologic: CN 2-12 grossly intact. Sensation intact, DTR normal. Strength 5/5 in all 4.  Psychiatric: Normal judgment and insight. Alert and oriented x 3. Normal mood.     Labs on Admission: I have personally reviewed following labs and imaging studies  CBC: Recent Labs  Lab 11/05/19 1630  WBC 6.5  NEUTROABS 3.8  HGB 7.6*  HCT 22.8*  MCV 99.1  PLT 123456   Basic Metabolic Panel: Recent Labs  Lab 11/05/19 1630  NA 136  K 4.1  CL 104  CO2 23  GLUCOSE 139*  BUN 22  CREATININE 1.04*  CALCIUM 8.5*   GFR: Estimated  Creatinine Clearance: 49.7 mL/min (A) (by C-G formula based on SCr of 1.04 mg/dL (H)). Liver Function Tests: Recent Labs  Lab 11/05/19 1630  AST 41  ALT 40  ALKPHOS 41  BILITOT 3.7*  PROT 6.5  ALBUMIN 3.6   No results for input(s): LIPASE, AMYLASE in the last 168 hours. No results for input(s): AMMONIA in the last 168 hours. Coagulation Profile: No results for input(s): INR, PROTIME in the last 168 hours. Cardiac Enzymes: No results for input(s): CKTOTAL, CKMB, CKMBINDEX, TROPONINI in the last 168 hours. BNP (last 3 results) No results for input(s): PROBNP in the last 8760 hours. HbA1C: No results for input(s): HGBA1C in the last 72 hours. CBG: No results for input(s): GLUCAP in the last 168 hours. Lipid Profile: No results for input(s): CHOL, HDL, LDLCALC, TRIG, CHOLHDL, LDLDIRECT in the last 72 hours. Thyroid Function Tests: No results for input(s): TSH, T4TOTAL, FREET4, T3FREE, THYROIDAB in the last 72 hours. Anemia Panel: No results for input(s): VITAMINB12, FOLATE, FERRITIN, TIBC, IRON, RETICCTPCT in the last 72 hours. Urine analysis:    Component Value Date/Time   COLORURINE AMBER (A) 10/24/2019 2020   APPEARANCEUR HAZY (A) 10/24/2019 2020   LABSPEC 1.017 10/24/2019 2020   PHURINE 5.0 10/24/2019 2020   GLUCOSEU 50 (A) 10/24/2019 2020   HGBUR SMALL (A) 10/24/2019 2020   BILIRUBINUR NEGATIVE 10/24/2019 2020   KETONESUR NEGATIVE 10/24/2019 2020   PROTEINUR NEGATIVE 10/24/2019 2020   UROBILINOGEN 0.2 01/11/2014 1200   NITRITE NEGATIVE 10/24/2019 2020   LEUKOCYTESUR SMALL (A) 10/24/2019 2020   Sepsis Labs: @LABRCNTIP (procalcitonin:4,lacticidven:4) )No results found for this or any previous visit (from the past 240 hour(s)).   Radiological Exams on Admission: DG Chest Portable 1 View  Result Date: 11/05/2019 CLINICAL DATA:  Shortness of breath since yesterday, diagnosed with COVID 2 weeks ago, hypertension, paroxysmal atrial fibrillation EXAM: PORTABLE CHEST 1  VIEW COMPARISON:  Portable exam 1335 hours compared to 10/24/2019 FINDINGS: Upper normal heart size. Mediastinal contours and pulmonary vascularity normal. Minimal patchy infiltrate at the lateral lower lungs bilaterally question pneumonia. Remaining lungs clear. Central peribronchial thickening. No pleural effusion or pneumothorax. IMPRESSION: Bronchitic changes with minimal patchy infiltrate in the periphery of the lower lungs question pneumonia. Electronically Signed   By: Lavonia Dana  M.D.   On: 11/05/2019 14:31    EKG: Independently reviewed. It shows A. fib with RVR rate of 125, no significant ST changes  Assessment/Plan Principal Problem:   Atrial fibrillation with rapid ventricular response (HCC) Active Problems:   Hyperlipidemia   Essential hypertension   Coronary artery disease involving native coronary artery of native heart with unstable angina pectoris (HCC)   Symptomatic anemia   MDS (myelodysplastic syndrome) (HCC)   Healthcare-associated pneumonia   Edema of both legs     #1 A. fib with RVR: Patient has known history of atrial fibrillation. She appears to be in A. fib with RVR which may be causing her current symptoms. We will admit to stepdown. Initiate Cardizem drip titrate until rate returned to normal. Continue with her oral medications.  #2 COVID-19 infection: Infiltrates do not appear to be getting worse. Probably from recent infection. Will treat empirically with antibiotics. Patient has been fully treated with remdesivir recently.-Continue steroids to complete treatment  #3 essential hypertension: Continue home regimen  #4 myelodysplastic syndrome: Stable. Hemoglobin appears close to baseline. Defer treatment to hematology. May consider actual transfusion.  #5 Healthcare associated pneumonia: Based on chest x-ray. Empirically treated with Vanco and cefepime. Await culture results.  #6 possible UTI: Urinalysis not very impressive. Continue antibiotics while waiting  for culture results  #7 symptomatic anemia: Again H&H is close to baseline. Secondary to myelodysplastic syndrome. Make reach out to hematology in the morning for possible transfusion.   DVT prophylaxis: Xarelto Code Status: Full code Family Communication: No family at bedside Disposition Plan: Home Consults called: None Admission status: Inpatient stepdown  Severity of Illness: The appropriate patient status for this patient is INPATIENT. Inpatient status is judged to be reasonable and necessary in order to provide the required intensity of service to ensure the patient's safety. The patient's presenting symptoms, physical exam findings, and initial radiographic and laboratory data in the context of their chronic comorbidities is felt to place them at high risk for further clinical deterioration. Furthermore, it is not anticipated that the patient will be medically stable for discharge from the hospital within 2 midnights of admission. The following factors support the patient status of inpatient.   " The patient's presenting symptoms include generalized weakness with palpitation. " The worrisome physical exam findings include irregularly irregular rhythm with tachycardia. " The initial radiographic and laboratory data are worrisome because of hemoglobin seven-point. " The chronic co-morbidities include myelodysplastic syndrome.   * I certify that at the point of admission it is my clinical judgment that the patient will require inpatient hospital care spanning beyond 2 midnights from the point of admission due to high intensity of service, high risk for further deterioration and high frequency of surveillance required.Barbette Merino MD [facility] Pager [phone]  If 7PM-7AM, please contact night-coverage [email or web address] Password [facility]  11/05/2019, 6:35 PM

## 2019-11-05 NOTE — Telephone Encounter (Signed)
Patient daughter Lynann Beaver called. CB# [phone]. Patient is pale, weak, hands are cold and she continues to have strong cough. Daughter asked if she can bring mother to [facility] for blood tests and/or transfusion. Dr. Irene Limbo informed.  Contacted daughter with MD response:  Patient needs evaluation to determine need for transfusion. Patient should go to ED for evaluation of symptoms as patient cannot be seen at [facility] until 1/27 or after. [Explained to Ms. Norris- Per current [facility] guidelines patient can return to [facility] 21 days post positive Covid test on 1/6] Daughter verbalized understanding. States mother's ankles are swollen. Patient also thinks she is having episodes of Afib with coughing. Again, encouraged patient evaluation at ED per Dr. Irene Limbo. Daughter states home health nurse has arrived during call and will inform her of Dr. Grier Mitts response.

## 2019-11-05 NOTE — ED Triage Notes (Signed)
Pt reports that she was Covid + 2 weeks ago and was admitted. Has been home for week after getting out of hospital. Reports that she started having SOB yesterday. Was sent for transfusion for her blood disorder.

## 2019-11-05 NOTE — Progress Notes (Signed)
A consult was received from an ED physician for vancomycin + cefepime per pharmacy dosing. The patient's profile has been reviewed for ht/wt/allergies/indication/available labs.    A one time order has been placed for cefepime 2 g IV Once + vancomycin 1500 mg IV once.    Further antibiotics/pharmacy consults should be ordered by admitting physician if indicated.                       Thank you, Lenis Noon, PharmD 11/05/2019  6:22 PM

## 2019-11-06 LAB — CBC
HCT: 18.8 % — ABNORMAL LOW (ref 36.0–46.0)
HCT: 21.8 % — ABNORMAL LOW (ref 36.0–46.0)
Hemoglobin: 6.1 g/dL — CL (ref 12.0–15.0)
Hemoglobin: 7.3 g/dL — ABNORMAL LOW (ref 12.0–15.0)
MCH: 33 pg (ref 26.0–34.0)
MCH: 33.5 pg (ref 26.0–34.0)
MCHC: 32.4 g/dL (ref 30.0–36.0)
MCHC: 33.5 g/dL (ref 30.0–36.0)
MCV: 101.6 fL — ABNORMAL HIGH (ref 80.0–100.0)
MCV: 100 fL (ref 80.0–100.0)
Platelets: 188 10*3/uL (ref 150–400)
Platelets: 183 10*3/uL (ref 150–400)
RBC: 1.85 MIL/uL — ABNORMAL LOW (ref 3.87–5.11)
RBC: 2.18 MIL/uL — ABNORMAL LOW (ref 3.87–5.11)
RDW: 22 % — ABNORMAL HIGH (ref 11.5–15.5)
RDW: 20.7 % — ABNORMAL HIGH (ref 11.5–15.5)
WBC: 4 10*3/uL (ref 4.0–10.5)
WBC: 3.8 10*3/uL — ABNORMAL LOW (ref 4.0–10.5)
nRBC: 5.2 % — ABNORMAL HIGH (ref 0.0–0.2)
nRBC: 4.8 % — ABNORMAL HIGH (ref 0.0–0.2)

## 2019-11-06 LAB — URINALYSIS, ROUTINE W REFLEX MICROSCOPIC
Bacteria, UA: NONE SEEN
Bilirubin Urine: NEGATIVE
Glucose, UA: NEGATIVE mg/dL
Hgb urine dipstick: NEGATIVE
Ketones, ur: NEGATIVE mg/dL
Nitrite: NEGATIVE
Protein, ur: NEGATIVE mg/dL
Specific Gravity, Urine: >1.046 — ABNORMAL HIGH (ref 1.005–1.030)
pH: 5 (ref 5.0–8.0)

## 2019-11-06 LAB — COMPREHENSIVE METABOLIC PANEL
ALT: 29 U/L (ref 0–44)
AST: 26 U/L (ref 15–41)
Albumin: 3 g/dL — ABNORMAL LOW (ref 3.5–5.0)
Alkaline Phosphatase: 32 U/L — ABNORMAL LOW (ref 38–126)
Anion gap: 9 (ref 5–15)
BUN: 21 mg/dL (ref 8–23)
CO2: 23 mmol/L (ref 22–32)
Calcium: 7.9 mg/dL — ABNORMAL LOW (ref 8.9–10.3)
Chloride: 105 mmol/L (ref 98–111)
Creatinine, Ser: 1.01 mg/dL — ABNORMAL HIGH (ref 0.44–1.00)
GFR calc Af Amer: >60 mL/min (ref >60)
GFR calc non Af Amer: 55 mL/min — ABNORMAL LOW (ref >60)
Glucose, Bld: 128 mg/dL — ABNORMAL HIGH (ref 70–99)
Potassium: 4.1 mmol/L (ref 3.5–5.1)
Sodium: 137 mmol/L (ref 135–145)
Total Bilirubin: 2.7 mg/dL — ABNORMAL HIGH (ref 0.3–1.2)
Total Protein: 5.3 g/dL — ABNORMAL LOW (ref 6.5–8.1)

## 2019-11-06 LAB — HEMOGLOBIN AND HEMATOCRIT, BLOOD
HCT: 25.6 % — ABNORMAL LOW (ref 36.0–46.0)
Hemoglobin: 8.2 g/dL — ABNORMAL LOW (ref 12.0–15.0)

## 2019-11-06 LAB — PREPARE RBC (CROSSMATCH)

## 2019-11-06 LAB — MRSA PCR SCREENING: MRSA by PCR: NEGATIVE

## 2019-11-06 LAB — LACTIC ACID, PLASMA: Lactic Acid, Venous: 0.9 mmol/L (ref 0.5–1.9)

## 2019-11-06 LAB — TSH: TSH: 2.551 u[IU]/mL (ref 0.350–4.500)

## 2019-11-06 LAB — LACTATE DEHYDROGENASE: LDH: 428 U/L — ABNORMAL HIGH (ref 98–192)

## 2019-11-06 MED ORDER — PREDNISONE 20 MG PO TABS
60.0000 mg | ORAL_TABLET | Freq: Every day | ORAL | Status: DC
  Administered 2019-11-07 – 2019-11-08 (×2): 60 mg via ORAL
  Filled 2019-11-06 (×2): qty 3

## 2019-11-06 MED ORDER — ORAL CARE MOUTH RINSE
15.0000 mL | Freq: Two times a day (BID) | OROMUCOSAL | Status: DC
  Administered 2019-11-06 – 2019-11-08 (×5): 15 mL via OROMUCOSAL

## 2019-11-06 MED ORDER — SODIUM CHLORIDE 0.9% IV SOLUTION: Freq: Once | INTRAVENOUS | Status: DC

## 2019-11-06 MED ORDER — PREDNISONE 20 MG PO TABS: 40.0000 mg | ORAL_TABLET | Freq: Every day | ORAL | Status: DC

## 2019-11-06 MED ORDER — PREDNISONE 10 MG PO TABS: 10.0000 mg | ORAL_TABLET | Freq: Every day | ORAL | Status: DC

## 2019-11-06 MED ORDER — PREDNISONE 20 MG PO TABS: 50.0000 mg | ORAL_TABLET | Freq: Every day | ORAL | Status: DC

## 2019-11-06 NOTE — Progress Notes (Signed)
PROGRESS NOTE  MELODY CAMERON ZOX:[id number] DOB: 04-Nov-1945 DOA: 11/05/2019 PCP: Eartha Inch, MD  Brief History    Alexandra Pierce is a 74 y.o. female with medical history significant of paroxysmal atrial fibrillation, hypertension, hyperlipidemia, history of CVA, myelodysplastic syndrome with hemolytic anemia now was admitted on January 6 with symptomatic anemia requiring IVIG prednisone and was found to have COVID-19 infection. Patient also had A. fib with RVR at the time. Patient treated and discharged home after treatment with dexamethasone and antibiotics. She was not requiring oxygen at the time. She had completed remdesivir for 5 days. She was on tapered dose of prednisone at discharge. She came back today due to worsening shortness of breath cough and fever. Symptoms apparently started yesterday with weakness. She also felt dizzy. The shortness of breath started getting worse. She has been on Xarelto but noted to have hemoglobin 7.6. Baseline is around 8 g. She also had no evidence of melena hematemesis or hematochezia. No other significant symptoms but patient was found to be hypoxic now requiring oxygen. CT angiogram showed no PE but mainly patchy peripheral pulmonary opacities residual for Covid pneumonia. Patient being admitted to the hospital for further treatment.  Temperature is 98.8 blood pressure 93/56 pulse 135 respiratory rate of 28 oxygen sats 94% on room air. White count 6.5 hemoglobin 7.6 platelets 260. Chemistry relatively normal except for creatinine 1.04 calcium 8.5. CT angiogram showed no acute findings. Lactic acid 2.0. INR is 2.2 and patient is fully anticoagulated. Urinalysis showed large leukocytes. WBC 21-50 but no bacteria seen. Urine and blood cultures collected and patient is being admitted for treatment.  [facility] were consulted to admit the patient for further evaluation and treatment.  In the early morning hours of 11/06/2019 the patient's  hemoglobin had dropped to 6.1. She was transfused with one unit. Post transfusion CBC demonstrated a hemoglobin of 7.3. She will receive another unit in transfusion.   Consultants  . Hematology Oncology  Procedures  . None  Antibiotics  . None  Subjective  The patient is resting. She has no new complaints.  Objective   Vitals:  Vitals:   11/06/19 1200 11/06/19 1300  BP: (!) 83/42 (!) 90/46  Pulse: (!) 102 99  Resp: 20 (!) 21  Temp:    SpO2: 100% 99%   Exam:  Constitutional:  . The patient is awake, alert, and oriented x 3. No acute distress. Respiratory:  . No increased work of breathing. . No wheezes, rales, or rhonchi . No tactile fremitus Cardiovascular:  . Tachycardia, Irregular rate and rhythm . No murmurs, ectopy, or gallups. . No lateral PMI. No thrills. Abdomen:  . Abdomen is soft, non-tender, non-distended . No hernias, masses, or organomegaly . Normoactive bowel sounds.  Musculoskeletal:  . No cyanosis, clubbing, or edema Skin:  . No rashes, lesions, ulcers . palpation of skin: no induration or nodules Neurologic:  . CN 2-12 intact . Sensation all 4 extremities intact Psychiatric:  . Mental status o Mood, affect appropriate o Orientation to person, place, time  . judgment and insight appear intact  I have personally reviewed the following:   Today's Data  . CMP, CBC, Vitals  Micro Data  . COVID- 19 Positive on 10/24/2019.  Imaging  . CT chest: No pulmonary embolus, patchy peripheral pulmonary opacities representing residual findings of COVID-19.  Cardiology Data  . EKG - Atrial fibrillation with RVR  Scheduled Meds: . sodium chloride   Intravenous Once  . sodium chloride  Intravenous Once  . vitamin C  500 mg Oral Daily  . B-complex with vitamin C  1 tablet Oral Daily  . Chlorhexidine Gluconate Cloth  6 each Topical Daily  . cholecalciferol  1,000 Units Oral Daily  . fenofibrate  160 mg Oral Daily  . fluticasone  1 spray Each Nare  Daily  . folic acid  2 mg Oral Daily  . mouth rinse  15 mL Mouth Rinse BID  . metoprolol tartrate  12.5 mg Oral BID  . [START ON 11/08/2019] predniSONE  50 mg Oral Q breakfast   Followed by  . [START ON 11/15/2019] predniSONE  40 mg Oral Q breakfast   Followed by  . [START ON 11/20/2019] predniSONE  10 mg Oral Q breakfast  . predniSONE  60 mg Oral Q breakfast  . rivaroxaban  20 mg Oral Daily  . cyanocobalamin  1,000 mcg Oral Daily   Continuous Infusions: . ceFEPime (MAXIPIME) IV Stopped (11/06/19 1019)  . vancomycin      Principal Problem:   Atrial fibrillation with rapid ventricular response (HCC) Active Problems:   Hyperlipidemia   Essential hypertension   Coronary artery disease involving native coronary artery of native heart with unstable angina pectoris (HCC)   Symptomatic anemia   MDS (myelodysplastic syndrome) (HCC)   Healthcare-associated pneumonia   Edema of both legs   LOS: 1 day   A & P  A. fib with RVR: Patient has known history of atrial fibrillation. She appears to be in A. fib with RVR which may be causing her current symptoms. Rate is improved on metoprolol 12.5 mg bid and with volume expansion with transfusion. Rate is now 99 bpm. The patient cannot tolerate more beta or calcium channel blockade due to hypotension.   COVID-19 infection: Infiltrates do not appear to be getting worse. Probably from recent infection. Will treat empirically with antibiotics. Patient has been fully treated with remdesivir recently. Consider increasing steroids to address apparent hemolytic anemia. Will discuss with hematology.   Essential hypertension: Systolic blood pressures are 90 and lower today. Support blood pressures with transfusion. Monitor. Low blood pressures limit our ability to address AF with RVR.  Myelodysplastic syndrome: Stable. Hemoglobin appears close to baseline. Hematology consulted. Will give solumedrol to address apparent hemolytic anemia.  Hemolytic  anemia/symptomatic.: T bill 2.7. LDH and haptoglobin are pending. Anemia 7.6 - 6.1- (+1 unit) 7.3. Will start solumedrol. Hematology has been consulted. tHis is probably related to the patient's fevers.  Healthcare associated pneumonia: Doubt. CT and CXR findings are likely remnants of COVID-19 pneumonia. The patient's is febrile, but her WBC is normal. Procalcitonin is pending. Will stop antibiotics for HCAP at this time.   UTI: Urinalysis is equivocal. Continue antibiotics while waiting for culture results.  Symptomatic anemia: Again H&H is close to baseline. Secondary to myelodysplastic syndrome. Hematology has been consulted.  I have seen and examined this patient myself. I have spent 38 minutes in his evaluation and care.  DVT prophylaxis: Xarelto Code Status: Full code Family Communication: No family at bedside Disposition Plan: Home  Marcea Rojek, DO [facility] Direct contact: see [email or web address]  7PM-7AM contact night coverage as above 11/06/2019, 1:08 PM  LOS: 1 day

## 2019-11-06 NOTE — Progress Notes (Signed)
CRITICAL VALUE ALERT  Critical Value:  Hgb 6.1  Date & Time Notied:  11/06/19 @ 03:00  Provider Notified: Yes - Mohammad  Orders Received/Actions taken: Awaiting blood preparation for transfusion of 1 unit  RN will continue to monitor patient closely at this time.

## 2019-11-07 LAB — TYPE AND SCREEN
ABO/RH(D): A POS
Antibody Screen: POSITIVE
Donor AG Type: NEGATIVE
Donor AG Type: NEGATIVE
Unit division: 0
Unit division: 0

## 2019-11-07 LAB — BASIC METABOLIC PANEL
Anion gap: 8 (ref 5–15)
BUN: 21 mg/dL (ref 8–23)
CO2: 23 mmol/L (ref 22–32)
Calcium: 8.2 mg/dL — ABNORMAL LOW (ref 8.9–10.3)
Chloride: 105 mmol/L (ref 98–111)
Creatinine, Ser: 0.69 mg/dL (ref 0.44–1.00)
GFR calc Af Amer: >60 mL/min (ref >60)
GFR calc non Af Amer: >60 mL/min (ref >60)
Glucose, Bld: 190 mg/dL — ABNORMAL HIGH (ref 70–99)
Potassium: 4.5 mmol/L (ref 3.5–5.1)
Sodium: 136 mmol/L (ref 135–145)

## 2019-11-07 LAB — CBC
HCT: 24.6 % — ABNORMAL LOW (ref 36.0–46.0)
Hemoglobin: 8.2 g/dL — ABNORMAL LOW (ref 12.0–15.0)
MCH: 31.7 pg (ref 26.0–34.0)
MCHC: 33.3 g/dL (ref 30.0–36.0)
MCV: 95 fL (ref 80.0–100.0)
Platelets: 162 10*3/uL (ref 150–400)
RBC: 2.59 MIL/uL — ABNORMAL LOW (ref 3.87–5.11)
RDW: 20 % — ABNORMAL HIGH (ref 11.5–15.5)
WBC: 3.2 10*3/uL — ABNORMAL LOW (ref 4.0–10.5)
nRBC: 2.2 % — ABNORMAL HIGH (ref 0.0–0.2)

## 2019-11-07 LAB — BPAM RBC
Blood Product Expiration Date: 202102222359
Blood Product Expiration Date: 202102242359
ISSUE DATE / TIME: 202101190315
ISSUE DATE / TIME: 202101191057
Unit Type and Rh: 6200
Unit Type and Rh: 6200

## 2019-11-07 LAB — URINE CULTURE

## 2019-11-07 NOTE — Progress Notes (Signed)
PROGRESS NOTE  Alexandra Pierce MVH:[id number] DOB: 11-10-1945 DOA: 11/05/2019 PCP: Alexandra Inch, MD  Brief History    Alexandra Pierce is a 74 y.o. female with medical history significant of paroxysmal atrial fibrillation, hypertension, hyperlipidemia, history of CVA, myelodysplastic syndrome with hemolytic anemia now was admitted on January 6 with symptomatic anemia requiring IVIG prednisone and was found to have COVID-19 infection. Patient also had A. fib with RVR at the time. Patient treated and discharged home after treatment with dexamethasone and antibiotics. She was not requiring oxygen at the time. She had completed remdesivir for 5 days. She was on tapered dose of prednisone at discharge. She came back today due to worsening shortness of breath cough and fever. Symptoms apparently started yesterday with weakness. She also felt dizzy. The shortness of breath started getting worse. She has been on Xarelto but noted to have hemoglobin 7.6. Baseline is around 8 g. She also had no evidence of melena hematemesis or hematochezia. No other significant symptoms but patient was found to be hypoxic now requiring oxygen. CT angiogram showed no PE but mainly patchy peripheral pulmonary opacities residual for Covid pneumonia. Patient being admitted to the hospital for further treatment.  Temperature is 98.8 blood pressure 93/56 pulse 135 respiratory rate of 28 oxygen sats 94% on room air. White count 6.5 hemoglobin 7.6 platelets 260. Chemistry relatively normal except for creatinine 1.04 calcium 8.5. CT angiogram showed no acute findings. Lactic acid 2.0. INR is 2.2 and patient is fully anticoagulated. Urinalysis showed large leukocytes. WBC 21-50 but no bacteria seen. Urine and blood cultures collected and patient is being admitted for treatment.  [facility] were consulted to admit the patient for further evaluation and treatment.  In the early morning hours of 11/06/2019 the patient's  hemoglobin had dropped to 6.1. She was transfused with one unit. Post transfusion CBC demonstrated a hemoglobin of 7.3. She will receive another unit in transfusion. Hemoglobin is 8.2 following transfusion.  She has qualified for home O2 (2L) at home.  Consultants  . Hematology Oncology  Procedures  . None  Antibiotics   Anti-infectives (From admission, onward)   Start     Dose/Rate Route Frequency Ordered Stop   11/06/19 1800  vancomycin (VANCOCIN) IVPB 1000 mg/200 mL premix  Status:  Discontinued     1,000 mg 200 mL/hr over 60 Minutes Intravenous Every 24 hours 11/05/19 1901 11/07/19 0836   11/06/19 1000  ceFEPIme (MAXIPIME) 2 g in sodium chloride 0.9 % 100 mL IVPB  Status:  Discontinued     2 g 200 mL/hr over 30 Minutes Intravenous Every 12 hours 11/05/19 1901 11/07/19 0836   11/05/19 1830  vancomycin (VANCOREADY) IVPB 1500 mg/300 mL     1,500 mg 150 mL/hr over 120 Minutes Intravenous  Once 11/05/19 1818 11/05/19 2049   11/05/19 1815  vancomycin (VANCOCIN) IVPB 1000 mg/200 mL premix  Status:  Discontinued     1,000 mg 200 mL/hr over 60 Minutes Intravenous  Once 11/05/19 1810 11/05/19 1818   11/05/19 1815  ceFEPIme (MAXIPIME) 2 g in sodium chloride 0.9 % 100 mL IVPB     2 g 200 mL/hr over 30 Minutes Intravenous  Once 11/05/19 1810 11/05/19 1917    .   Subjective  The patient states that she is feeling better. She has no new complaints.  Objective   Vitals:  Vitals:   11/07/19 1115 11/07/19 1324  BP:  93/64  Pulse: (!) 124 90  Resp: 18 17  Temp:  98.9 F (37.2 C)  SpO2: 99% 100%   Exam:  Constitutional:  . The patient is awake, alert, and oriented x 3. No acute distress. Respiratory:  . No increased work of breathing. . No wheezes, rales, or rhonchi . No tactile fremitus Cardiovascular:  . Tachycardia, Irregular rate and rhythm . No murmurs, ectopy, or gallups. . No lateral PMI. No thrills. Abdomen:  . Abdomen is soft, non-tender, non-distended . No  hernias, masses, or organomegaly . Normoactive bowel sounds.  Musculoskeletal:  . No cyanosis, clubbing, or edema Skin:  . No rashes, lesions, ulcers . palpation of skin: no induration or nodules Neurologic:  . CN 2-12 intact . Sensation all 4 extremities intact Psychiatric:  . Mental status o Mood, affect appropriate o Orientation to person, place, time  . judgment and insight appear intact  I have personally reviewed the following:   Today's Data  . CMP, CBC, Vitals  Micro Data  . COVID- 19 Positive on 10/24/2019. [name] Kitchen Urine culture - multiple species present.  Imaging  . CT chest: No pulmonary embolus, patchy peripheral pulmonary opacities representing residual findings of COVID-19.  Cardiology Data  . EKG - Atrial fibrillation with RVR  Scheduled Meds: . vitamin C  500 mg Oral Daily  . B-complex with vitamin C  1 tablet Oral Daily  . Chlorhexidine Gluconate Cloth  6 each Topical Daily  . cholecalciferol  1,000 Units Oral Daily  . fenofibrate  160 mg Oral Daily  . fluticasone  1 spray Each Nare Daily  . folic acid  2 mg Oral Daily  . mouth rinse  15 mL Mouth Rinse BID  . metoprolol tartrate  12.5 mg Oral BID  . predniSONE  60 mg Oral Q breakfast  . rivaroxaban  20 mg Oral Daily  . cyanocobalamin  1,000 mcg Oral Daily   Continuous Infusions:   Principal Problem:   Atrial fibrillation with rapid ventricular response (HCC) Active Problems:   Hyperlipidemia   Essential hypertension   Coronary artery disease involving native coronary artery of native heart with unstable angina pectoris (HCC)   Symptomatic anemia   MDS (myelodysplastic syndrome) (HCC)   Healthcare-associated pneumonia   Edema of both legs   LOS: 2 days   A & P  A. fib with RVR: Patient has known history of atrial fibrillation. She appears to be in A. fib with RVR which may be causing her current symptoms. Rate is improved on metoprolol 12.5 mg bid and with volume expansion with transfusion. Rate  is now 99 bpm. The patient cannot tolerate more beta or calcium channel blockade due to marginal blood pressures.   COVID-19 infection: Infiltrates do not appear to be getting worse. Probably from recent infection. Will treat empirically with antibiotics. Patient has been fully treated with remdesivir recently. Consider increasing steroids to address apparent hemolytic anemia. I have discussed the patient with her oncologist, Dr Candise Che. He recommends continuing the patient on prednisone 60 mg daily until his next visit with her. He states that he would only consider IVIG, etc if the patient were to continue to require every-other-day transfusions. I appreciate Dr. Clyda Greener assistance.  Essential hypertension: Systolic blood pressures are 90 and lower today. Support blood pressures with transfusion. Monitor. Low blood pressures limit our ability to address AF with RVR.  Myelodysplastic syndrome: Stable. Hemoglobin appears close to baseline. Hematology consulted. Will give solumedrol to address apparent hemolytic anemia.  Hemolytic anemia/symptomatic.: T bill 2.7. LDH and haptoglobin are pending. Anemia 7.6 - 6.1- (+1  unit) 7.3.This is probably related to the patient's COVID in the setting of myelodysplasia. I have discussed the patient with her oncologist, Dr Candise Che. He recommends continuing the patient on prednisone 60 mg daily until his next visit with her. He states that he would only consider IVIG, etc if the patient were to continue to require every-other-day transfusions. I appreciate Dr. Clyda Greener assistance.  Healthcare associated pneumonia: Doubt. CT and CXR findings are likely remnants of COVID-19 pneumonia. The patient's is febrile, but her WBC is normal. Procalcitonin is pending. Will stop antibiotics for HCAP at this time.   UTI: Urinalysis is equivocal. Urine culture has grown out multiple species. Antibiotics have been stopped.  Symptomatic anemia:  I have discussed the patient with her  oncologist, Dr Candise Che. He recommends continuing the patient on prednisone 60 mg daily until his next visit with her. He states that he would only consider IVIG, etc if the patient were to continue to require every-other-day transfusions. I appreciate Dr. Clyda Greener assistance.   I have seen and examined this patient myself. I have spent 35 minutes in his evaluation and care.  DVT prophylaxis: Xarelto Code Status: Full code Family Communication: No family at bedside Disposition Plan: Anticipate discharge to home with O2 on 11/08/19.  Abhishek Levesque, DO [facility] Direct contact: see [email or web address]  7PM-7AM contact night coverage as above 11/07/2019, 2:21 PM  LOS: 1 day

## 2019-11-07 NOTE — Progress Notes (Signed)
SATURATION QUALIFICATIONS: (This note is used to comply with regulatory documentation for home oxygen)  Patient Saturations on Room Air at Rest = 90%  Patient Saturations on Room Air while Ambulating = 87%  Patient Saturations on 2 Liters of oxygen while Ambulating = 96%  Patient requires oxygen when ambulating

## 2019-11-08 MED ORDER — METOPROLOL TARTRATE 25 MG PO TABS: 12.5000 mg | ORAL_TABLET | Freq: Two times a day (BID) | ORAL | Status: AC

## 2019-11-08 MED ORDER — PREDNISONE 20 MG PO TABS: 60.0000 mg | ORAL_TABLET | Freq: Every day | ORAL | 0 refills | Status: AC

## 2019-11-08 NOTE — TOC Transition Note (Signed)
Transition of Care [facility]) - CM/SW Discharge Note   Patient Details  Name: Alexandra Pierce MRN: [id number] Date of Birth: [birth year]  Transition of Care [facility]) CM/SW Contact:  Lia Hopping, [place] Phone Number: 11/08/2019, 11:37 AM   Clinical Narrative:    DME Home Oxygen orders acknowledged.  CSW made a referral to [place]. Patient will transport by Car.  Oxygen will be delivered to the patient room. Nursing staff aware.   Final next level of care: Home/Self Care Barriers to Discharge: No Barriers Identified   Patient Goals and CMS Choice     Choice offered to / list presented to : NA  Discharge Placement                       Discharge Plan and Services                DME Arranged: Oxygen DME Agency: [employer] Date DME Agency Contacted: 11/08/19 Time DME Agency Contacted: 1100 Representative spoke with at DME Agency: [place] Determinants of Health ([place]) Interventions     Readmission Risk Interventions No flowsheet data found.

## 2019-11-08 NOTE — Discharge Summary (Signed)
Physician Discharge Summary  Alexandra Pierce YNW:[id number] DOB: 01-05-1946 DOA: 11/05/2019  PCP: Eartha Inch, MD  Admit date: 11/05/2019 Discharge date: 11/08/2019  Recommendations for Outpatient Follow-up:  1. Follow up with PCP in 7-10 days. 2. Have CBC drawn in 7 days. To be reported to Dr. Carlis Stable and PCP. 3. Follow up with Dr. Carlis Stable within 2 weeks.  Discharge Diagnoses: Principal diagnosis is #1 1. Atrial fibrillation with RVR 2. COVID-19 infection 3. Essential Hypertension 4. Myelodysplastic syndrome 5. Hemolytic anemia 6. Symptomatic anemia  Discharge Condition: Fair  Disposition: Home  Diet recommendation: Heart Healthy  Filed Weights   11/05/19 1635 11/06/19 0000  Weight: 77.1 kg 74.1 kg    History of present illness:   Alexandra Pierce is a 74 y.o. female with medical history significant of paroxysmal atrial fibrillation, hypertension, hyperlipidemia, history of CVA, myelodysplastic syndrome with hemolytic anemia now was admitted on January 6 with symptomatic anemia requiring IVIG prednisone and was found to have COVID-19 infection. Patient also had A. fib with RVR at the time. Patient treated and discharged home after treatment with dexamethasone and antibiotics. She was not requiring oxygen at the time. She had completed remdesivir for 5 days. She was on tapered dose of prednisone at discharge. She came back today due to worsening shortness of breath cough and fever. Symptoms apparently started yesterday with weakness. She also felt dizzy. The shortness of breath started getting worse. She has been on Xarelto but noted to have hemoglobin 7.6. Baseline is around 8 g. She also had no evidence of melena hematemesis or hematochezia. No other significant symptoms but patient was found to be hypoxic now requiring oxygen. CT angiogram showed no PE but mainly patchy peripheral pulmonary opacities residual for Covid pneumonia. Patient being admitted to the hospital for further  treatment.  ED Course: Temperature is 98.8 blood pressure 93/56 pulse 135 respiratory rate of 28 oxygen sats 94% on room air. White count 6.5 hemoglobin 7.6 platelets 260. Chemistry relatively normal except for creatinine 1.04 calcium 8.5. CT angiogram showed no acute findings. Lactic acid 2.0. INR is 2.2 and patient is fully anticoagulated. Urinalysis showed large leukocytes. WBC 21-50 but no bacteria seen. Urine and blood cultures collected and patient is being admitted for treatment.  Hospital Course:  [facility] were consulted to admit the patient for further evaluation and treatment.  In the early morning hours of 11/06/2019 the patient's hemoglobin had dropped to 6.1. She was transfused with one unit. Post transfusion CBC demonstrated a hemoglobin of 7.3. She will receive another unit in transfusion. Hemoglobin is 8.2 following transfusion. Heart rate has been controlled with metoprolol She is also on xarelto for stroke prophylaxis. I have discussed the patient with her oncologist, Dr Candise Che. He recommends continuing the patient on prednisone 60 mg daily until his next visit with her. He states that he would only consider IVIG, etc if the patient were to continue to require every-other-day transfusions. I appreciate Dr. Clyda Greener assistance.  Although she is easily saturating in the 90's on room air at rest, with activity her oxygen saturations drop to the mid 80's. She has qualified for home O2 (2L) at home.  Today's assessment: S: The patient is resting comfortably. No new complaints. O: Vitals:  Vitals:   11/08/19 1200 11/08/19 1220  BP: 94/60   Pulse: (!) 101   Resp: 18   Temp:  97.8 F (36.6 C)  SpO2: 100%    Exam:  Constitutional:   The patient is  awake, alert, and oriented x 3. No acute distress. Respiratory:   No increased work of breathing.  No wheezes, rales, or rhonchi  No tactile fremitus Cardiovascular:   Tachycardia, Irregular rate and  rhythm  No murmurs, ectopy, or gallups.  No lateral PMI. No thrills. Abdomen:   Abdomen is soft, non-tender, non-distended  No hernias, masses, or organomegaly  Normoactive bowel sounds.  Musculoskeletal:   No cyanosis, clubbing, or edema Skin:   No rashes, lesions, ulcers  palpation of skin: no induration or nodules Neurologic:   CN 2-12 intact  Sensation all 4 extremities intact Psychiatric:   Mental status ? Mood, affect appropriate ? Orientation to person, place, time   judgment and insight appear intact   Discharge Instructions  Discharge Instructions    Activity as tolerated - No restrictions   Complete by: As directed    Call MD for:  difficulty breathing, headache or visual disturbances   Complete by: As directed    Call MD for:  temperature >100.4   Complete by: As directed    Diet - low sodium heart healthy   Complete by: As directed    Discharge instructions   Complete by: As directed    Follow up with PCP in 7-10 days. Have CBC drawn in 7 days. To be reported to Dr. Carlis Stable and PCP. Follow up with Dr. Carlis Stable within 2 weeks.   Increase activity slowly   Complete by: As directed      Allergies as of 11/08/2019      Reactions   Sulfonamide Derivatives Anaphylaxis   Lipitor [atorvastatin] Other (See Comments)   Caused bladder infection   Pravachol [pravastatin Sodium] Other (See Comments)   Myalgias   Statins Other (See Comments)   Myalgia      Medication List    TAKE these medications   b complex vitamins capsule Take 1 capsule by mouth daily.   benzonatate 100 MG capsule Commonly known as: TESSALON Take 1 capsule (100 mg total) by mouth every 8 (eight) hours as needed for cough.   cholecalciferol 1000 units tablet Commonly known as: VITAMIN D Take 1,000 Units by mouth daily.   cyanocobalamin 1000 MCG tablet Take 1,000 mcg by mouth daily.   esomeprazole 40 MG capsule Commonly known as: NEXIUM TAKE 1 CAPSULE(40 MG) BY MOUTH DAILY  BEFORE BREAKFAST   fenofibrate 160 MG tablet Take 1 tablet (160 mg total) by mouth daily.   fluticasone 50 MCG/ACT nasal spray Commonly known as: FLONASE Place 1 spray into both nostrils daily.   folic acid 1 MG tablet Commonly known as: FOLVITE Take 5 tablets (5 mg total) by mouth daily. What changed: how much to take   metoprolol tartrate 25 MG tablet Commonly known as: LOPRESSOR Take 0.5 tablets (12.5 mg total) by mouth 2 (two) times daily.   predniSONE 20 MG tablet Commonly known as: DELTASONE Take 3 tablets (60 mg total) by mouth daily with breakfast. What changed:   medication strength  how much to take  how to take this  when to take this  additional instructions  Another medication with the same name was removed. Continue taking this medication, and follow the directions you see here.   VITAMIN C PO Take 1 tablet by mouth daily.   Xarelto 20 MG Tabs tablet Generic drug: rivaroxaban TAKE 1 TABLET BY MOUTH ONCE DAILY What changed: how much to take            Durable Medical Equipment  (From admission, onward)  Start     Ordered   11/07/19 1420  For home use only DME oxygen  Once    Question Answer Comment  Length of Need Lifetime   Mode or (Route) Nasal cannula   Liters per Minute 2   Frequency Continuous (stationary and portable oxygen unit needed)   Oxygen conserving device Yes   Oxygen delivery system Gas      11/07/19 1419         Allergies  Allergen Reactions  . Sulfonamide Derivatives Anaphylaxis  . Lipitor [Atorvastatin] Other (See Comments)    Caused bladder infection  . Pravachol [Pravastatin Sodium] Other (See Comments)    Myalgias  . Statins Other (See Comments)    Myalgia    The results of significant diagnostics from this hospitalization (including imaging, microbiology, ancillary and laboratory) are listed below for reference.    Significant Diagnostic Studies: CT Angio Chest PE W and/or Wo Contrast  Result  Date: 11/05/2019 CLINICAL DATA:  Shortness of breath, sepsis EXAM: CT ANGIOGRAPHY CHEST WITH CONTRAST TECHNIQUE: Multidetector CT imaging of the chest was performed using the standard protocol during bolus administration of intravenous contrast. Multiplanar CT image reconstructions and MIPs were obtained to evaluate the vascular anatomy. CONTRAST:  OMNIPAQUE IOHEXOL 350 MG/ML SOLN COMPARISON:  2019 FINDINGS: Cardiovascular: Satisfactory opacification of the pulmonary arteries to the proximal segmental level. There is suboptimal evaluation particularly at the lung bases due to motion artifact. No evidence of pulmonary embolism. Cardiomegaly. No pericardial effusion. Coronary artery calcification. Mild calcification along the thoracic aorta. Mediastinum/Nodes: No mediastinal, hilar, or axillary adenopathy. Lungs/Pleura: There are patchy peripheral opacities. No pleural effusion or pneumothorax. Upper Abdomen: No acute abnormality. Hepatic cysts and mild splenomegaly are again noted. Musculoskeletal: No acute abnormality. Review of the MIP images confirms the above findings. IMPRESSION: No evidence of acute pulmonary embolism. Patchy peripheral pulmonary opacities, which may reflect atelectasis or may reflect residual findings of COVID-19 pneumonia, noting recent hospitalization. Cardiomegaly. Electronically Signed   By: Guadlupe Spanish M.D.   On: 11/05/2019 19:49   DG Chest Portable 1 View  Result Date: 11/05/2019 CLINICAL DATA:  Shortness of breath since yesterday, diagnosed with COVID 2 weeks ago, hypertension, paroxysmal atrial fibrillation EXAM: PORTABLE CHEST 1 VIEW COMPARISON:  Portable exam 1335 hours compared to 10/24/2019 FINDINGS: Upper normal heart size. Mediastinal contours and pulmonary vascularity normal. Minimal patchy infiltrate at the lateral lower lungs bilaterally question pneumonia. Remaining lungs clear. Central peribronchial thickening. No pleural effusion or pneumothorax. IMPRESSION:  Bronchitic changes with minimal patchy infiltrate in the periphery of the lower lungs question pneumonia. Electronically Signed   By: Ulyses Southward M.D.   On: 11/05/2019 14:31   DG Chest Portable 1 View  Result Date: 10/24/2019 CLINICAL DATA:  Cough. EXAM: PORTABLE CHEST 1 VIEW COMPARISON:  October 10, 2019 FINDINGS: Mildly decreased lung volumes are seen likely secondary to suboptimal patient inspiration. There is no evidence of acute infiltrate, pleural effusion or pneumothorax. The heart size and mediastinal contours are within normal limits. Degenerative changes seen throughout the thoracic spine. IMPRESSION: No active disease. Electronically Signed   By: Aram Candela M.D.   On: 10/24/2019 17:36   DG Chest Port 1 View  Result Date: 10/10/2019 CLINICAL DATA:  Shortness of breath for 1 week, atrial fibrillation, persistent dry cough, COVID-19 negative on 10/03/2019 EXAM: PORTABLE CHEST 1 VIEW COMPARISON:  Portable exam 1200 hours compared to 10/03/2019 FINDINGS: Normal heart size, mediastinal contours, and pulmonary vascularity. Lungs clear. No pleural effusion or pneumothorax.  Bones appear demineralized. IMPRESSION: No acute abnormalities. Electronically Signed   By: Ulyses Southward M.D.   On: 10/10/2019 12:20   ECHOCARDIOGRAM COMPLETE  Result Date: 10/11/2019   ECHOCARDIOGRAM REPORT   Patient Name:   INGRY MOLLISON Date of Exam: 10/11/2019 Medical Rec #:  [id number]    Height:       66.0 in Accession #:    [id number]   Weight:       177.0 lb Date of Birth:  09-06-1946     BSA:          1.90 m Patient Age:    73 years     BP:           108/73 mmHg Patient Gender: F            HR:           111 bpm. Exam Location:  Inpatient Procedure: 2D Echo Indications:    dyspnea 786.09  History:        Patient has prior history of Echocardiogram examinations, most                 recent 03/17/2016. Stroke; Risk Factors:Dyslipidemia and                 Hypertension. Paroxysmal a-fib.  Sonographer:    Celene Skeen RDCS  (AE) Referring Phys: [id number] [facility] IMPRESSIONS  1. Left ventricular ejection fraction, by visual estimation, is 60 to 65%. The left ventricle has normal function. There is no left ventricular hypertrophy.  2. Left ventricular diastolic function could not be evaluated.  3. The left ventricle has no regional wall motion abnormalities.  4. Global right ventricle has normal systolic function.The right ventricular size is normal. No increase in right ventricular wall thickness.  5. Left atrial size was severely dilated.  6. Right atrial size was normal.  7. Trivial pericardial effusion is present.  8. The mitral valve is normal in structure. Trivial mitral valve regurgitation. No evidence of mitral stenosis.  9. The tricuspid valve is normal in structure. 10. The aortic valve is normal in structure. Aortic valve regurgitation is not visualized. Mild aortic valve sclerosis without stenosis. 11. The pulmonic valve was grossly normal. Pulmonic valve regurgitation is not visualized. 12. TR signal is inadequate for assessing pulmonary artery systolic pressure. 13. The inferior vena cava is dilated in size with <50% respiratory variability, suggesting right atrial pressure of 15 mmHg. FINDINGS  Left Ventricle: Left ventricular ejection fraction, by visual estimation, is 60 to 65%. The left ventricle has normal function. The left ventricle has no regional wall motion abnormalities. The left ventricular internal cavity size was the left ventricle is normal in size. There is no left ventricular hypertrophy. The left ventricular diastology could not be evaluated due to atrial fibrillation. Left ventricular diastolic function could not be evaluated. Right Ventricle: The right ventricular size is normal. No increase in right ventricular wall thickness. Global RV systolic function is has normal systolic function. Left Atrium: Left atrial size was severely dilated. Right Atrium: Right atrial size was normal in size  Pericardium: Trivial pericardial effusion is present. Mitral Valve: The mitral valve is normal in structure. There is mild thickening of the mitral valve leaflet(s). Trivial mitral valve regurgitation. No evidence of mitral valve stenosis by observation. Tricuspid Valve: The tricuspid valve is normal in structure. Tricuspid valve regurgitation is trivial. Aortic Valve: The aortic valve is normal in structure. Aortic valve regurgitation is not visualized. Mild aortic valve  sclerosis is present, with no evidence of aortic valve stenosis. Pulmonic Valve: The pulmonic valve was grossly normal. Pulmonic valve regurgitation is not visualized. Pulmonic regurgitation is not visualized. Aorta: The aortic root, ascending aorta and aortic arch are all structurally normal, with no evidence of dilitation or obstruction. Venous: The inferior vena cava is dilated in size with less than 50% respiratory variability, suggesting right atrial pressure of 15 mmHg. A roughly spherical 5 cm hypoechoic lesion is seen in the left lobe of the liver, possibly a cyst. IAS/Shunts: No atrial level shunt detected by color flow Doppler. There is no evidence of a patent foramen ovale. No ventricular septal defect is seen or detected. There is no evidence of an atrial septal defect.  LEFT VENTRICLE PLAX 2D LVIDd:         4.60 cm LVIDs:         2.90 cm LV PW:         1.20 cm LV IVS:        1.00 cm LVOT diam:     1.90 cm LV SV:         65 ml LV SV Index:   33.32 LVOT Area:     2.84 cm  RIGHT VENTRICLE RV S prime:     16.00 cm/s TAPSE (M-mode): 1.3 cm LEFT ATRIUM             Index LA diam:        4.50 cm 2.37 cm/m LA Vol (A2C):   70.6 ml 37.18 ml/m LA Vol (A4C):   78.9 ml 41.56 ml/m LA Biplane Vol: 75.3 ml 39.66 ml/m  AORTIC VALVE LVOT Vmax:   91.40 cm/s LVOT Vmean:  59.600 cm/s LVOT VTI:    0.175 m  AORTA Ao Root diam: 3.00 cm MITRAL VALVE MV Area (PHT): 4.06 cm            SHUNTS MV PHT:        54.23 msec          Systemic VTI:  0.18 m MV Decel  Time: 187 msec            Systemic Diam: 1.90 cm MV E velocity: 98.50 cm/s 103 cm/s  Mihai Croitoru MD Electronically signed by Thurmon Fair MD Signature Date/Time: 10/11/2019/11:34:43 AM    Final     Microbiology: Recent Results (from the past 240 hour(s))  Blood Culture (routine x 2)     Status: None (Preliminary result)   Collection Time: 11/05/19  6:37 PM   Specimen: BLOOD  Result Value Ref Range Status   Specimen Description   Final    BLOOD RIGHT ANTECUBITAL Performed at [facility], [address], [place], [place] [address]    Special Requests   Final    BOTTLES DRAWN AEROBIC AND ANAEROBIC Blood Culture adequate volume Performed at [facility], [address] [place], [place] [address]    Culture   Final    NO GROWTH 3 DAYS Performed at [facility], [address], [place], [place] [address]    Report Status PENDING  Incomplete  Urine culture     Status: Abnormal   Collection Time: 11/05/19  6:37 PM   Specimen: In/Out Cath Urine  Result Value Ref Range Status   Specimen Description   Final    IN/OUT CATH URINE Performed at [facility], [address] [place], [place] [address]    Special Requests   Final  NONE Performed at [facility], [address], [place], [place] [address]    Culture MULTIPLE SPECIES PRESENT, SUGGEST RECOLLECTION (A)  Final   Report Status 11/07/2019 FINAL  Final  MRSA PCR Screening     Status: None   Collection Time: 11/05/19 11:03 PM   Specimen: Nasal Mucosa; Nasopharyngeal  Result Value Ref Range Status   MRSA by PCR NEGATIVE NEGATIVE Final    Comment:        The GeneXpert MRSA Assay (FDA approved for NASAL specimens only), is one component of a comprehensive MRSA colonization surveillance program. It is not intended to diagnose MRSA infection nor to guide or monitor treatment for MRSA infections. Performed at [facility], [address] [place], [place] [address]   Blood Culture (routine x 2)     Status: None (Preliminary result)   Collection Time: 11/06/19  1:04 AM   Specimen: BLOOD  Result Value Ref Range Status   Specimen Description   Final    BLOOD RIGHT ANTECUBITAL Performed at [facility], [address], [place], [place] [address]    Special Requests   Final    BOTTLES DRAWN AEROBIC ONLY Blood Culture adequate volume Performed at [facility], [address], [place], [place] [address]    Culture   Final    NO GROWTH 2 DAYS Performed at [facility], [address], [place], [place] [address]    Report Status PENDING  Incomplete     Labs: Basic Metabolic Panel: Recent Labs  Lab 11/05/19 1630 11/06/19 0103 11/07/19 0113  NA 136 137 136  K 4.1 4.1 4.5  CL 104 105 105  CO2 23 23 23   GLUCOSE 139* 128* 190*  BUN 22 21 21   CREATININE 1.04* 1.01* 0.69  CALCIUM 8.5* 7.9* 8.2*   Liver Function Tests: Recent Labs  Lab 11/05/19 1630 11/06/19 0103  AST 41 26  ALT 40 29  ALKPHOS 41 32*  BILITOT 3.7* 2.7*  PROT 6.5 5.3*  ALBUMIN 3.6 3.0*   No results for input(s): LIPASE, AMYLASE in the last 168 hours. No results for input(s): AMMONIA in the last 168 hours. CBC: Recent Labs  Lab 11/05/19 1630 11/06/19 0103 11/06/19 0828 11/06/19 1616 11/07/19 0113  WBC 6.5 4.0 3.8*  --  3.2*  NEUTROABS 3.8  --   --   --   --   HGB 7.6* 6.1* 7.3* 8.2* 8.2*  HCT 22.8* 18.8* 21.8* 25.6* 24.6*  MCV 99.1 101.6* 100.0  --  95.0  PLT 260 188 183  --  162   Cardiac Enzymes: No results for input(s): CKTOTAL, CKMB, CKMBINDEX, TROPONINI in the last 168 hours. BNP: BNP (last 3 results) Recent Labs    10/10/19 1154 11/05/19 1635  BNP 130.7* 104.9*    ProBNP (last 3 results) No results for input(s): PROBNP in the last 8760 hours.  CBG: No results for input(s): GLUCAP in the last 168 hours.  Principal Problem:   Atrial fibrillation with rapid  ventricular response (HCC) Active Problems:   Hyperlipidemia   Essential hypertension   Coronary artery disease involving native coronary artery of native heart with unstable angina pectoris (HCC)   Symptomatic anemia   MDS (myelodysplastic syndrome) (HCC)   Healthcare-associated pneumonia   Edema of both legs   Time coordinating discharge: 38 minutes  Signed:        Antowan Samford, DO [facility]  11/08/2019, 5:51 PM

## 2019-11-10 LAB — CULTURE, BLOOD (ROUTINE X 2)
Culture: NO GROWTH
Special Requests: ADEQUATE

## 2019-11-11 LAB — CULTURE, BLOOD (ROUTINE X 2)
Culture: NO GROWTH
Special Requests: ADEQUATE

## 2019-11-19 ENCOUNTER — Inpatient Hospital Stay: Payer: Medicare Other | Attending: Hematology | Admitting: Hematology

## 2019-11-19 ENCOUNTER — Ambulatory Visit: Payer: Medicare Other | Admitting: Hematology

## 2019-11-19 ENCOUNTER — Telehealth: Payer: Self-pay | Admitting: *Deleted

## 2019-11-19 ENCOUNTER — Inpatient Hospital Stay: Payer: Medicare Other

## 2019-11-19 ENCOUNTER — Encounter: Payer: Medicare Other | Admitting: Nutrition

## 2019-11-19 ENCOUNTER — Ambulatory Visit: Payer: Medicare Other

## 2019-11-19 ENCOUNTER — Other Ambulatory Visit: Payer: Self-pay

## 2019-11-19 DIAGNOSIS — E669 Obesity, unspecified: Secondary | ICD-10-CM | POA: Diagnosis not present

## 2019-11-19 DIAGNOSIS — Z7901 Long term (current) use of anticoagulants: Secondary | ICD-10-CM | POA: Insufficient documentation

## 2019-11-19 DIAGNOSIS — D5911 Warm autoimmune hemolytic anemia: Secondary | ICD-10-CM

## 2019-11-19 DIAGNOSIS — D461 Refractory anemia with ring sideroblasts: Secondary | ICD-10-CM | POA: Insufficient documentation

## 2019-11-19 DIAGNOSIS — E785 Hyperlipidemia, unspecified: Secondary | ICD-10-CM | POA: Diagnosis not present

## 2019-11-19 DIAGNOSIS — D473 Essential (hemorrhagic) thrombocythemia: Secondary | ICD-10-CM | POA: Diagnosis present

## 2019-11-19 DIAGNOSIS — Z8673 Personal history of transient ischemic attack (TIA), and cerebral infarction without residual deficits: Secondary | ICD-10-CM | POA: Insufficient documentation

## 2019-11-19 DIAGNOSIS — I119 Hypertensive heart disease without heart failure: Secondary | ICD-10-CM | POA: Diagnosis not present

## 2019-11-19 DIAGNOSIS — D649 Anemia, unspecified: Secondary | ICD-10-CM

## 2019-11-19 LAB — CBC WITH DIFFERENTIAL (CANCER CENTER ONLY)
Abs Immature Granulocytes: 0.13 10*3/uL — ABNORMAL HIGH (ref 0.00–0.07)
Basophils Absolute: 0 10*3/uL (ref 0.0–0.1)
Basophils Relative: 1 %
Eosinophils Absolute: 0 10*3/uL (ref 0.0–0.5)
Eosinophils Relative: 0 %
HCT: 18.6 % — ABNORMAL LOW (ref 36.0–46.0)
Hemoglobin: 6.2 g/dL — CL (ref 12.0–15.0)
Immature Granulocytes: 2 %
Lymphocytes Relative: 19 %
Lymphs Abs: 1.2 10*3/uL (ref 0.7–4.0)
MCH: 31.5 pg (ref 26.0–34.0)
MCHC: 33.3 g/dL (ref 30.0–36.0)
MCV: 94.4 fL (ref 80.0–100.0)
Monocytes Absolute: 0.3 10*3/uL (ref 0.1–1.0)
Monocytes Relative: 4 %
Neutro Abs: 4.8 10*3/uL (ref 1.7–7.7)
Neutrophils Relative %: 74 %
Platelet Count: 310 10*3/uL (ref 150–400)
RBC: 1.97 MIL/uL — ABNORMAL LOW (ref 3.87–5.11)
RDW: 20.3 % — ABNORMAL HIGH (ref 11.5–15.5)
WBC Count: 6.4 10*3/uL (ref 4.0–10.5)
nRBC: 6.5 % — ABNORMAL HIGH (ref 0.0–0.2)

## 2019-11-19 LAB — CMP (CANCER CENTER ONLY)
ALT: 39 U/L (ref 0–44)
AST: 20 U/L (ref 15–41)
Albumin: 3.7 g/dL (ref 3.5–5.0)
Alkaline Phosphatase: 51 U/L (ref 38–126)
Anion gap: 10 (ref 5–15)
BUN: 23 mg/dL (ref 8–23)
CO2: 24 mmol/L (ref 22–32)
Calcium: 8.8 mg/dL — ABNORMAL LOW (ref 8.9–10.3)
Chloride: 108 mmol/L (ref 98–111)
Creatinine: 0.94 mg/dL (ref 0.44–1.00)
GFR, Est AFR Am: >60 mL/min (ref >60)
GFR, Estimated: 60 mL/min — ABNORMAL LOW (ref >60)
Glucose, Bld: 171 mg/dL — ABNORMAL HIGH (ref 70–99)
Potassium: 4.1 mmol/L (ref 3.5–5.1)
Sodium: 142 mmol/L (ref 135–145)
Total Bilirubin: 3.2 mg/dL — ABNORMAL HIGH (ref 0.3–1.2)
Total Protein: 6.2 g/dL — ABNORMAL LOW (ref 6.5–8.1)

## 2019-11-19 LAB — LACTATE DEHYDROGENASE: LDH: 753 U/L — ABNORMAL HIGH (ref 98–192)

## 2019-11-19 NOTE — Progress Notes (Signed)
HEMATOLOGY/ONCOLOGY CLINIC NOTE  Date of Service: 11/19/2019  Patient Care Team: Chesley Noon, MD as PCP - General (Family Medicine) Burtis Junes, NP as PCP - Cardiology (Nurse Practitioner)  CHIEF COMPLAINTS/PURPOSE OF CONSULTATION:  mx of MDS/MPN Toxicity check for Rituxan  HISTORY OF PRESENTING ILLNESS:   Alexandra Pierce is a wonderful 74 y.o. female who has been referred to [place] by Dr. Eliseo Squires for evaluation and management of symptomatic anemia.   Patient has history of hypertension, dyslipidemia, atrial fibrillation on anticoagulation with previous history of CVA x2, obesity who presented with generalized weakness.  Prior to admission she had an episode of blurring of vision especially in the right eye associated with a spell of confusion and possible loss of consciousness for a few seconds associated with disorientation.  She notes that she has also been feeling progressively fatigued for a few weeks and has had difficulty with dyspnea on exertion with short distances. Her daughters insisted she get evaluated and so she came to the emergency room for further evaluation. She does follow-up with cardiology and is being evaluated by pulmonary at the end of the month as well.  Patient previously has been on Coumadin from 2000 but was switched to Xarelto several years ago.  She notes no overt evidence of black stools or bleeding in the stools, no hematuria no nosebleeds no gum bleeds. Notes that she has been generally eating okay. Does endorse new night sweats over the last month or 2. No abdominal pain or significant change in bowel habits.  In the emergency room the patient had labs which showed hemoglobin of 6.6.5 with an MCV of 96.5.  Normal WBC count of 7.2k with elevated platelets of 685k which on repeat were down to 573k. Reticulocyte counts were relatively suppressed. LDH was elevated to 517 but haptoglobin was within normal limits at 45. Coombs negative Myeloma panel  showed no M spike Ferritin level was elevated to 845 suggestive of acute phase reaction/inflammation. B12 and folate within normal limits.  Patient received 2 units of PRBCs with an appropriate bump in her hemoglobin level and felt much better. CT chest abdomen pelvis was done which showed mild splenomegaly without a focal splenic lesion.  Multiple hepatic cysts.  Abnormal hypoenhancement in the right kidney lower pole of undetermined etiology -broad differential based on radiology input. right perirenal stranding is associated with a small amount of fluid in the adjacent right paracolic gutter, and a small fluid collection along the inferior margin of the liver which could be an exophytic cyst, small complex fluid collection, or conceivably a tumor nodule.  No other significant other lymphadenopathy noted.  Patient has subsequently had a bone marrow examination with the results currently pending.  Current Treatment: Cycle 4 of Rituxan  Interval History:  I connected with  Alexandra Pierce on 11/19/19 by telephone and verified that I am speaking with the correct person using two identifiers.   I discussed the limitations of evaluation and management by telemedicine. The patient expressed understanding and agreed to proceed.  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 The patient's  last visit with [place] was on 10/24/2019. The pt reports that she is doing well overall.  The pt reports that she has been feeling well since leaving the hospital and is currently using 2 liters of oxygen per day. Her appetite has  increased since discharge. She has been eating and drinking well. Pt is currently on 60 mg Prednisone per day. Pt has continued taking Folic acid, Vitamin 123456, and Zarelto. Melissa denies that the pt is having any bleeding issues. Pt is SOB, but her cough has improved significantly. Pt has been taking 1/2 25 mg tablet of Metaprolol BID. Pt has an appointment to see Dr. Leretha Pol next Monday.    Lab results (11/19/19) of CBC w/diff and CMP is as follows: all values are WNL except for  RBC at 1.97, Hgb at 6.2, HCT at 18.6, RDW at 20.3, nRBC Rel at 6.5, Abs Immature Granulocytes at 0.13K, Glucose at 171, Calcium at 8.8, Total Protein at 6.2, Total Bilirubin at 3.2, GFR Est Non Af Am at 60. 11/19/2019 LDH at 753  On review of systems, pt reports fatigue, SOB, improving cough and denies dizziness, lightheadedness, chest pain, bleeding concerns and any other symptoms.    Past Medical History:  Diagnosis Date  . Chronic lower back pain   . Hypercholesteremia   . Hypertension    mild  . Obesity   . Paroxysmal atrial fibrillation (HCC)    managed with anticoagulation and rate control.   . Stroke [facility]) 2001 & 2015   "right side gets weak sometimes if I'm only tired" (03/16/2016)    SURGICAL HISTORY: Past Surgical History:  Procedure Laterality Date  . CARDIAC CATHETERIZATION  03/31/2000   EF 49%/LAD lg vessel which coursed to the apex & gave rise to 2 diagonal branches/ LAD noted to have 30% ostial lesion & tapers to a sm vessel toward the apex but no high grade lesion/1st diagonal med sized vessel & 2nd diagonal sm vessel with no significant disease/ lt ventriculogram reveals mild global hypokinesis w/ an estimated EF of 45-50%  . CARDIAC CATHETERIZATION N/A 03/17/2016   Procedure: Left Heart Cath and Coronary Angiography;  Surgeon: Wellington Hampshire, MD;  Location: [facility];  Service: Cardiovascular;  Laterality: N/A;  . LEFT HEART CATH AND CORONARY ANGIOGRAPHY N/A 03/31/2018   Procedure:  LEFT HEART CATH AND CORONARY ANGIOGRAPHY;  Surgeon: Martinique, Peter M, MD;  Location: [place] [facility];  Service: Cardiovascular;  Laterality: N/A;    SOCIAL HISTORY: Social History   Socioeconomic History  . Marital status: Married    Spouse name: Not on file  . Number of children: 2  . Years of education: Not on file  . Highest education level: Not on file  Occupational History  . Occupation: Herbalist  Tobacco Use  . Smoking status: Former Smoker    Packs/day: 0.12    Years: 15.00    Pack years: 1.80    Types: Cigarettes    Quit date: 10/18/1978    Years since quitting: 41.1  . Smokeless tobacco: Never Used  Substance and Sexual Activity  . Alcohol use: Yes    Alcohol/week: 14.0 standard drinks    Types: 14 Glasses of wine per week  . Drug use: No  . Sexual activity: Yes  Other Topics Concern  . Not on file  Social History Narrative  . Not on file   Social Determinants of Health   Financial Resource Strain:   . Difficulty of Paying Living Expenses: Not  on file  Food Insecurity:   . Worried About Charity fundraiser in the Last Year: Not on file  . Ran Out of Food in the Last Year: Not on file  Transportation Needs:   . Lack of Transportation (Medical): Not on file  . Lack of Transportation (Non-Medical): Not on file  Physical Activity:   . Days of Exercise per Week: Not on file  . Minutes of Exercise per Session: Not on file  Stress:   . Feeling of Stress : Not on file  Social Connections:   . Frequency of Communication with Friends and Family: Not on file  . Frequency of Social Gatherings with Friends and Family: Not on file  . Attends Religious Services: Not on file  . Active Member of Clubs or Organizations: Not on file  . Attends Archivist Meetings: Not on file  . Marital Status: Not on file  Intimate Partner Violence:   . Fear of Current or Ex-Partner: Not on file  . Emotionally Abused: Not on file  . Physically Abused: Not on file  .  Sexually Abused: Not on file    FAMILY HISTORY: Family History  Problem Relation Age of Onset  . Heart disease Father   . Asthma Father   . Diabetes Father   . Heart disease Mother   . Heart disease Brother   . Hyperlipidemia Sister   . Hyperlipidemia Brother   . Hyperlipidemia Brother   . Cancer Neg Hx     ALLERGIES:  is allergic to sulfonamide derivatives; lipitor [atorvastatin]; pravachol [pravastatin sodium]; and statins.  MEDICATIONS:  Current Outpatient Medications  Medication Sig Dispense Refill  . Ascorbic Acid (VITAMIN C PO) Take 1 tablet by mouth daily.    [name] Kitchen b complex vitamins capsule Take 1 capsule by mouth daily.     . benzonatate (TESSALON) 100 MG capsule Take 1 capsule (100 mg total) by mouth every 8 (eight) hours as needed for cough. 21 capsule 0  . cholecalciferol (VITAMIN D) 1000 UNITS tablet Take 1,000 Units by mouth daily.    . cyanocobalamin 1000 MCG tablet Take 1,000 mcg by mouth daily.     [name] Kitchen esomeprazole (NEXIUM) 40 MG capsule TAKE 1 CAPSULE(40 MG) BY MOUTH DAILY BEFORE BREAKFAST (Patient not taking: Reported on 11/05/2019) 90 capsule 1  . fenofibrate 160 MG tablet Take 1 tablet (160 mg total) by mouth daily. 90 tablet 3  . fluticasone (FLONASE) 50 MCG/ACT nasal spray Place 1 spray into both nostrils daily.    . folic acid (FOLVITE) 1 MG tablet Take 5 tablets (5 mg total) by mouth daily. (Patient taking differently: Take 2 mg by mouth daily. ) 60 tablet 22  . metoprolol tartrate (LOPRESSOR) 25 MG tablet Take 0.5 tablets (12.5 mg total) by mouth 2 (two) times daily.    . predniSONE (DELTASONE) 20 MG tablet Take 3 tablets (60 mg total) by mouth daily with breakfast. 90 tablet 0  . XARELTO 20 MG TABS tablet TAKE 1 TABLET BY MOUTH ONCE DAILY (Patient taking differently: Take 20 mg by mouth daily. ) 30 tablet 5   No current facility-administered medications for this visit.    REVIEW OF SYSTEMS:   A 10+ POINT REVIEW OF SYSTEMS WAS OBTAINED including neurology,  dermatology, psychiatry, cardiac, respiratory, lymph, extremities, GI, GU, Musculoskeletal, constitutional, breasts, reproductive, HEENT.  All pertinent positives are noted in the HPI.  All others are negative.   PHYSICAL EXAMINATION: ECOG FS:1 - Symptomatic but completely ambulatory  There were  no vitals filed for this visit. Wt Readings from Last 3 Encounters:  11/06/19 163 lb 5.8 oz (74.1 kg)  10/28/19 165 lb 11.2 oz (75.2 kg)  10/24/19 165 lb 6.4 oz (75 kg)   There is no height or weight on file to calculate BMI.    Telehealth visit  LABORATORY DATA:  I have reviewed the data as listed  . CBC Latest Ref Rng & Units 11/07/2019 11/06/2019 11/06/2019  WBC 4.0 - 10.5 K/uL 3.2(L) - 3.8(L)  Hemoglobin 12.0 - 15.0 g/dL 8.2(L) 8.2(L) 7.3(L)  Hematocrit 36.0 - 46.0 % 24.6(L) 25.6(L) 21.8(L)  Platelets 150 - 400 K/uL 162 - 183    . CMP Latest Ref Rng & Units 11/07/2019 11/06/2019 11/05/2019  Glucose 70 - 99 mg/dL 190(H) 128(H) 139(H)  BUN 8 - 23 mg/dL 21 21 22   Creatinine 0.44 - 1.00 mg/dL 0.69 1.01(H) 1.04(H)  Sodium 135 - 145 mmol/L 136 137 136  Potassium 3.5 - 5.1 mmol/L 4.5 4.1 4.1  Chloride 98 - 111 mmol/L 105 105 104  CO2 22 - 32 mmol/L 23 23 23   Calcium 8.9 - 10.3 mg/dL 8.2(L) 7.9(L) 8.5(L)  Total Protein 6.5 - 8.1 g/dL - 5.3(L) 6.5  Total Bilirubin 0.3 - 1.2 mg/dL - 2.7(H) 3.7(H)  Alkaline Phos 38 - 126 U/L - 32(L) 41  AST 15 - 41 U/L - 26 41  ALT 0 - 44 U/L - 29 40   05/31/18 Molecular Pathology:   05/24/18 Cytogenetics:     05/24/18 BM Bx:   05/24/18 BM Flow cytometry:     RADIOGRAPHIC STUDIES: I have personally reviewed the radiological images as listed and agreed with the findings in the report. CT Angio Chest PE W and/or Wo Contrast  Result Date: 11/05/2019 CLINICAL DATA:  Shortness of breath, sepsis EXAM: CT ANGIOGRAPHY CHEST WITH CONTRAST TECHNIQUE: Multidetector CT imaging of the chest was performed using the standard protocol during bolus administration of  intravenous contrast. Multiplanar CT image reconstructions and MIPs were obtained to evaluate the vascular anatomy. CONTRAST:  131mL OMNIPAQUE IOHEXOL 350 MG/ML SOLN COMPARISON:  2019 FINDINGS: Cardiovascular: Satisfactory opacification of the pulmonary arteries to the proximal segmental level. There is suboptimal evaluation particularly at the lung bases due to motion artifact. No evidence of pulmonary embolism. Cardiomegaly. No pericardial effusion. Coronary artery calcification. Mild calcification along the thoracic aorta. Mediastinum/Nodes: No mediastinal, hilar, or axillary adenopathy. Lungs/Pleura: There are patchy peripheral opacities. No pleural effusion or pneumothorax. Upper Abdomen: No acute abnormality. Hepatic cysts and mild splenomegaly are again noted. Musculoskeletal: No acute abnormality. Review of the MIP images confirms the above findings. IMPRESSION: No evidence of acute pulmonary embolism. Patchy peripheral pulmonary opacities, which may reflect atelectasis or may reflect residual findings of COVID-19 pneumonia, noting recent hospitalization. Cardiomegaly. Electronically Signed   By: Macy Mis M.D.   On: 11/05/2019 19:49   DG Chest Portable 1 View  Result Date: 11/05/2019 CLINICAL DATA:  Shortness of breath since yesterday, diagnosed with COVID 2 weeks ago, hypertension, paroxysmal atrial fibrillation EXAM: PORTABLE CHEST 1 VIEW COMPARISON:  Portable exam 1335 hours compared to 10/24/2019 FINDINGS: Upper normal heart size. Mediastinal contours and pulmonary vascularity normal. Minimal patchy infiltrate at the lateral lower lungs bilaterally question pneumonia. Remaining lungs clear. Central peribronchial thickening. No pleural effusion or pneumothorax. IMPRESSION: Bronchitic changes with minimal patchy infiltrate in the periphery of the lower lungs question pneumonia. Electronically Signed   By: Lavonia Dana M.D.   On: 11/05/2019 14:31   DG Chest Portable 1  View  Result Date:  10/24/2019 CLINICAL DATA:  Cough. EXAM: PORTABLE CHEST 1 VIEW COMPARISON:  October 10, 2019 FINDINGS: Mildly decreased lung volumes are seen likely secondary to suboptimal patient inspiration. There is no evidence of acute infiltrate, pleural effusion or pneumothorax. The heart size and mediastinal contours are within normal limits. Degenerative changes seen throughout the thoracic spine. IMPRESSION: No active disease. Electronically Signed   By: Virgina Norfolk M.D.   On: 10/24/2019 17:36    ASSESSMENT & PLAN:   74 y.o. female with  1. 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 No overt evidence of bleeding.  In the emergency room the patient had labs which showed hemoglobin of 6.6.5 with an MCV of 96.5.  Normal WBC count of 7.2k with elevated platelets of 685k which on repeat were down to 573k. Reticulocyte counts were relatively suppressed. LDH was elevated to 517 but haptoglobin was within normal limits at 45. Coombs negative Myeloma panel showed no M spike Ferritin level was elevated to 845 suggestive of acute phase reaction/inflammation. B12 and folate within normal limits.  05/24/18 BM Bx results which revealed erythroid and megakaryocytic proliferation and ring sideroblasts concerning for myeloproliferative/ myelodysplastic neoplasm   No excess blasts; Acute leukemia and lymphoma are not indicated   2. Rt renal radiographic abnormality as noted in CT with some fluid collection. 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 No fevers chills to suggest acute pyelonephritis or renal abscess. Urine analysis showed trace leuk esterase and 6-10 WBCs no significant hematuria. Broad differential diagnosis based on CT findings as per radiologist. Procalcitonin levels unimpressive  05/23/18 CT Chest Abdomen Pelvis was done which showed mild splenomegaly without a focal splenic lesion. Multiple hepatic cysts.   Abnormal hypoenhancement in the right kidney lower pole of undetermined etiology -broad differential based on radiology input. right perirenal stranding is associated with a small amount of fluid in the adjacent right paracolic gutter, and a small fluid collection along the inferior margin of the liver which could be an exophytic cyst, small complex fluid collection, or conceivably a tumor nodule. No other significant other lymphadenopathy noted.   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 Warm Antibody hemolytic Anemia  4. Atrial fibrillation with RVR -Admitted pt to [facility] on 10/24/2019 for SOB and weakness due to Atrial Fibrillation with RVR and suspected COVID 19 infection likely from exposure to her daughter who was recently diagnosed. Patient daughter informed us about this after clinic visit  PLAN: -Discussed pt labwork today, 11/19/19;  all values are WNL except for  RBC at 1.97, Hgb at 6.2, HCT at 18.6, RDW at 20.3, nRBC Rel at 6.5, Abs Immature Granulocytes at 0.13K, Glucose at 171, Calcium at 8.8, Total Protein at 6.2, Total Bilirubin at 3.2, GFR Est Non Af Am at 60. -Discussed 11/19/2019 LDH is elevated at 753 -Discussed Hgb at 6.2 - pt will require a blood transfusion  -Total Bilirubin still at 3.2, indicating hemolysis -Pt is becoming iron overloaded due to multiple blood transfusions, 10/25/2019 Ferritin at 2727 - hemolysis can falsely inflate this number  -Will have to begin an iron chelating agents to address iron overload. -Advised pt that it will be difficult to manage MPN and warm agglutinin antibodies at the same time -Will restart Aranesp q2weeks -Continue 60 mg Predisone  -Continue to f/u with Dr. Randa Spike at [facility] on 11/25/2018 for  second opinion -Will set up will set up IVIG if needed -Will give 2 units PRBC tomorrow   The total time spent in the appt was  30 minutes and more than 50% was on counseling and direct patient cares.  All of the patient's questions were answered with apparent satisfaction. The patient knows to call the clinic with any problems, questions or concerns.   Sullivan Lone MD [place] AAHIVMS [facility] Hematology/Oncology Physician [facility]  (Office):       [phone] (Work cell):  [phone] (Fax):           [phone]  I, Yevette Edwards, am acting as a scribe for Dr. Sullivan Lone.   .I have reviewed the above documentation for accuracy and completeness, and I agree with the above. Brunetta Genera MD

## 2019-11-19 NOTE — Telephone Encounter (Signed)
1413: Contacted by Ramiro Harvest F/lab. Hgb 6.2. Dr. Irene Limbo informed [phone]

## 2019-11-20 ENCOUNTER — Inpatient Hospital Stay: Payer: Medicare Other

## 2019-11-20 ENCOUNTER — Other Ambulatory Visit: Payer: Self-pay

## 2019-11-20 ENCOUNTER — Other Ambulatory Visit: Payer: Self-pay | Admitting: Emergency Medicine

## 2019-11-20 DIAGNOSIS — D649 Anemia, unspecified: Secondary | ICD-10-CM

## 2019-11-20 DIAGNOSIS — D461 Refractory anemia with ring sideroblasts: Secondary | ICD-10-CM

## 2019-11-20 LAB — PREPARE RBC (CROSSMATCH)

## 2019-11-20 MED ORDER — ACETAMINOPHEN 325 MG PO TABS
650.0000 mg | ORAL_TABLET | Freq: Once | ORAL | Status: AC
  Administered 2019-11-20: 650 mg via ORAL

## 2019-11-20 MED ORDER — HEPARIN SOD (PORK) LOCK FLUSH 100 UNIT/ML IV SOLN
500.0000 [IU] | Freq: Every day | INTRAVENOUS | Status: DC | PRN
  Filled 2019-11-20: qty 5

## 2019-11-20 MED ORDER — DIPHENHYDRAMINE HCL 25 MG PO CAPS
ORAL_CAPSULE | ORAL | Status: AC
  Filled 2019-11-20: qty 1

## 2019-11-20 MED ORDER — SODIUM CHLORIDE 0.9% FLUSH
10.0000 mL | INTRAVENOUS | Status: DC | PRN
  Filled 2019-11-20: qty 10

## 2019-11-20 MED ORDER — DIPHENHYDRAMINE HCL 25 MG PO CAPS
25.0000 mg | ORAL_CAPSULE | Freq: Once | ORAL | Status: AC
  Administered 2019-11-20: 25 mg via ORAL

## 2019-11-20 MED ORDER — SODIUM CHLORIDE 0.9% IV SOLUTION
250.0000 mL | Freq: Once | INTRAVENOUS | Status: AC
  Administered 2019-11-20: 10:00:00 250 mL via INTRAVENOUS
  Filled 2019-11-20: qty 250

## 2019-11-20 MED ORDER — ACETAMINOPHEN 325 MG PO TABS
ORAL_TABLET | ORAL | Status: AC
  Filled 2019-11-20: qty 2

## 2019-11-20 MED ORDER — METHYLPREDNISOLONE SODIUM SUCC 40 MG IJ SOLR
40.0000 mg | Freq: Once | INTRAMUSCULAR | Status: AC
  Administered 2019-11-20: 40 mg via INTRAVENOUS

## 2019-11-20 MED ORDER — METHYLPREDNISOLONE SODIUM SUCC 40 MG IJ SOLR
INTRAMUSCULAR | Status: AC
  Filled 2019-11-20: qty 1

## 2019-11-20 NOTE — Progress Notes (Signed)
Pt received 2 units PRBCs today with blood warmer, tolerated well.   Able to eat and drink during transfusions w/out any issues.  Denies any questions or concerns at time of d/c, escorted via w/c to exit with belongings and home oxygen tank.

## 2019-11-20 NOTE — Patient Instructions (Signed)

## 2019-11-21 LAB — TYPE AND SCREEN
ABO/RH(D): A POS
Antibody Screen: POSITIVE
DAT, IgG: NEGATIVE
Unit division: 0
Unit division: 0

## 2019-11-21 LAB — BPAM RBC
Blood Product Expiration Date: 202103032359
Blood Product Expiration Date: 202103102359
ISSUE DATE / TIME: 202102021046
ISSUE DATE / TIME: 202102021046
Unit Type and Rh: 5100
Unit Type and Rh: 600

## 2019-11-23 ENCOUNTER — Ambulatory Visit: Payer: Medicare Other | Admitting: Hematology

## 2019-11-23 ENCOUNTER — Other Ambulatory Visit: Payer: Medicare Other

## 2019-11-27 ENCOUNTER — Other Ambulatory Visit: Payer: Self-pay | Admitting: *Deleted

## 2019-11-27 ENCOUNTER — Telehealth: Payer: Self-pay | Admitting: *Deleted

## 2019-11-27 DIAGNOSIS — D649 Anemia, unspecified: Secondary | ICD-10-CM

## 2019-11-27 DIAGNOSIS — D461 Refractory anemia with ring sideroblasts: Secondary | ICD-10-CM

## 2019-11-27 NOTE — Telephone Encounter (Signed)
Daughter called. Saw Dr. Leretha Pol at [facility] yesterday. Mother's hgb 7.5 and she is weak and tired. Does Dr. Irene Limbo think she should have blood this week? Dr. Irene Limbo informed. Dr. Irene Limbo ordered 2 units of blood for Wednesday. Appointments scheduled for lab and infusion. Notified daughter of times, she verbalized understanding and stated she will make sure her mother is here.

## 2019-11-28 ENCOUNTER — Telehealth: Payer: Self-pay | Admitting: *Deleted

## 2019-11-28 ENCOUNTER — Other Ambulatory Visit: Payer: Self-pay

## 2019-11-28 ENCOUNTER — Inpatient Hospital Stay: Payer: Medicare Other

## 2019-11-28 ENCOUNTER — Other Ambulatory Visit: Payer: Self-pay | Admitting: *Deleted

## 2019-11-28 DIAGNOSIS — D461 Refractory anemia with ring sideroblasts: Secondary | ICD-10-CM

## 2019-11-28 DIAGNOSIS — D649 Anemia, unspecified: Secondary | ICD-10-CM

## 2019-11-28 LAB — CBC WITH DIFFERENTIAL (CANCER CENTER ONLY)
Abs Immature Granulocytes: 0.03 10*3/uL (ref 0.00–0.07)
Basophils Absolute: 0 10*3/uL (ref 0.0–0.1)
Basophils Relative: 0 %
Eosinophils Absolute: 0 10*3/uL (ref 0.0–0.5)
Eosinophils Relative: 0 %
HCT: 18.2 % — ABNORMAL LOW (ref 36.0–46.0)
Hemoglobin: 6.3 g/dL — CL (ref 12.0–15.0)
Immature Granulocytes: 1 %
Lymphocytes Relative: 17 %
Lymphs Abs: 0.8 10*3/uL (ref 0.7–4.0)
MCH: 33 pg (ref 26.0–34.0)
MCHC: 34.6 g/dL (ref 30.0–36.0)
MCV: 95.3 fL (ref 80.0–100.0)
Monocytes Absolute: 0.1 10*3/uL (ref 0.1–1.0)
Monocytes Relative: 3 %
Neutro Abs: 3.6 10*3/uL (ref 1.7–7.7)
Neutrophils Relative %: 79 %
Platelet Count: 311 10*3/uL (ref 150–400)
RBC: 1.91 MIL/uL — ABNORMAL LOW (ref 3.87–5.11)
RDW: 20.7 % — ABNORMAL HIGH (ref 11.5–15.5)
WBC Count: 4.5 10*3/uL (ref 4.0–10.5)
nRBC: 0.4 % — ABNORMAL HIGH (ref 0.0–0.2)

## 2019-11-28 LAB — CMP (CANCER CENTER ONLY)
ALT: 23 U/L (ref 0–44)
AST: 22 U/L (ref 15–41)
Albumin: 3.4 g/dL — ABNORMAL LOW (ref 3.5–5.0)
Alkaline Phosphatase: 44 U/L (ref 38–126)
Anion gap: 13 (ref 5–15)
BUN: 24 mg/dL — ABNORMAL HIGH (ref 8–23)
CO2: 21 mmol/L — ABNORMAL LOW (ref 22–32)
Calcium: 7.8 mg/dL — ABNORMAL LOW (ref 8.9–10.3)
Chloride: 106 mmol/L (ref 98–111)
Creatinine: 0.85 mg/dL (ref 0.44–1.00)
GFR, Est AFR Am: >60 mL/min (ref >60)
GFR, Estimated: >60 mL/min (ref >60)
Glucose, Bld: 140 mg/dL — ABNORMAL HIGH (ref 70–99)
Potassium: 5.1 mmol/L (ref 3.5–5.1)
Sodium: 140 mmol/L (ref 135–145)
Total Bilirubin: 4 mg/dL (ref 0.3–1.2)
Total Protein: 5.7 g/dL — ABNORMAL LOW (ref 6.5–8.1)

## 2019-11-28 LAB — RETICULOCYTES
Immature Retic Fract: 25 % — ABNORMAL HIGH (ref 2.3–15.9)
RBC.: 1.34 MIL/uL — ABNORMAL LOW (ref 3.87–5.11)
Retic Count, Absolute: 112.4 10*3/uL (ref 19.0–186.0)
Retic Ct Pct: 8.4 % — ABNORMAL HIGH (ref 0.4–3.1)

## 2019-11-28 LAB — SAMPLE TO BLOOD BANK

## 2019-11-28 LAB — PREPARE RBC (CROSSMATCH)

## 2019-11-28 MED ORDER — DIPHENHYDRAMINE HCL 25 MG PO CAPS
ORAL_CAPSULE | ORAL | Status: AC
  Filled 2019-11-28: qty 1

## 2019-11-28 MED ORDER — ACETAMINOPHEN 325 MG PO TABS
650.0000 mg | ORAL_TABLET | Freq: Once | ORAL | Status: AC
  Administered 2019-11-28: 12:00:00 650 mg via ORAL

## 2019-11-28 MED ORDER — SODIUM CHLORIDE 0.9% IV SOLUTION
250.0000 mL | Freq: Once | INTRAVENOUS | Status: AC
  Administered 2019-11-28: 12:00:00 250 mL via INTRAVENOUS
  Filled 2019-11-28: qty 250

## 2019-11-28 MED ORDER — METHYLPREDNISOLONE SODIUM SUCC 125 MG IJ SOLR
INTRAMUSCULAR | Status: AC
  Filled 2019-11-28: qty 2

## 2019-11-28 MED ORDER — DIPHENHYDRAMINE HCL 25 MG PO CAPS
25.0000 mg | ORAL_CAPSULE | Freq: Once | ORAL | Status: AC
  Administered 2019-11-28: 12:00:00 25 mg via ORAL

## 2019-11-28 MED ORDER — ACETAMINOPHEN 325 MG PO TABS
ORAL_TABLET | ORAL | Status: AC
  Filled 2019-11-28: qty 2

## 2019-11-28 MED ORDER — METHYLPREDNISOLONE SODIUM SUCC 125 MG IJ SOLR
60.0000 mg | Freq: Once | INTRAMUSCULAR | Status: AC
  Administered 2019-11-28: 60 mg via INTRAVENOUS

## 2019-11-28 NOTE — Patient Instructions (Signed)
Recommend to patient to avoid cold:  Wear warm clothes; keep rooms warm; Warm baths or showers: Refrain from touching cold surfaces (such as cold beverages, frozen foods or other cold objects); Refrain from ingesting cold beverages.

## 2019-11-28 NOTE — Telephone Encounter (Signed)
Daughter Melissa called:Alexandra Pierce has UTI, on antibiotic right now. Thinks she could be dehydrated. She's slightly short of breath. Dr. Irene Limbo informed.  Per Dr. Irene Limbo, blood received today may improve breathing and Alexandra Pierce should increase po fluids. If symptoms persist or further symptoms occur - contact PCP and/or patient should be taken to ED for evaluation. Daughter informed and verbalized understanding.

## 2019-11-28 NOTE — Telephone Encounter (Signed)
8:40am - lab call to [facility] RN AD - hgb 6.3. Dr.Kale informed. Patient previously scheduled to receive 2 units PRBCs today 1201pm - lab/[facility] - Bilirubin 4.0. Dr. Irene Limbo informed

## 2019-11-29 ENCOUNTER — Telehealth: Payer: Self-pay | Admitting: *Deleted

## 2019-11-29 LAB — TYPE AND SCREEN
ABO/RH(D): A POS
Antibody Screen: POSITIVE
Donor AG Type: NEGATIVE
Donor AG Type: NEGATIVE
Unit division: 0
Unit division: 0

## 2019-11-29 LAB — BPAM RBC
Blood Product Expiration Date: 202103062359
Blood Product Expiration Date: 202103192359
ISSUE DATE / TIME: 202102101217
ISSUE DATE / TIME: 202102101217
Unit Type and Rh: 5100
Unit Type and Rh: 6200

## 2019-11-29 NOTE — Telephone Encounter (Signed)
Called patient per Dr. Grier Mitts request to schedule phone appt for Friday 2/12 at 12N. Patient in agreement. Schedule message sent

## 2019-11-30 ENCOUNTER — Other Ambulatory Visit: Payer: Self-pay

## 2019-11-30 ENCOUNTER — Emergency Department (HOSPITAL_COMMUNITY)
Admission: EM | Admit: 2019-11-30 | Discharge: 2019-11-30 | Discharge status: Absent without leave | Payer: Medicare Other

## 2019-11-30 ENCOUNTER — Ambulatory Visit (HOSPITAL_COMMUNITY)
Admission: RE | Admit: 2019-11-30 | Discharge: 2019-11-30 | Disposition: A | Discharge status: Home / Self Care | Payer: Medicare Other | Source: Ambulatory Visit | Attending: Hematology | Admitting: Hematology

## 2019-11-30 ENCOUNTER — Inpatient Hospital Stay (HOSPITAL_BASED_OUTPATIENT_CLINIC_OR_DEPARTMENT_OTHER): Payer: Medicare Other | Admitting: Hematology

## 2019-11-30 DIAGNOSIS — D469 Myelodysplastic syndrome, unspecified: Secondary | ICD-10-CM

## 2019-11-30 DIAGNOSIS — R0602 Shortness of breath: Secondary | ICD-10-CM | POA: Insufficient documentation

## 2019-11-30 DIAGNOSIS — D5912 Cold autoimmune hemolytic anemia: Secondary | ICD-10-CM | POA: Diagnosis not present

## 2019-12-01 ENCOUNTER — Encounter (HOSPITAL_COMMUNITY): Payer: Self-pay

## 2019-12-01 ENCOUNTER — Inpatient Hospital Stay (HOSPITAL_COMMUNITY)
Admission: EM | Admit: 2019-12-01 | Discharge: 2019-12-17 | DRG: 811 | Disposition: E | Payer: Medicare Other | Attending: Pulmonary Disease | Admitting: Pulmonary Disease

## 2019-12-01 ENCOUNTER — Other Ambulatory Visit: Payer: Self-pay

## 2019-12-01 ENCOUNTER — Emergency Department (HOSPITAL_COMMUNITY): Payer: Medicare Other

## 2019-12-01 DIAGNOSIS — Z8673 Personal history of transient ischemic attack (TIA), and cerebral infarction without residual deficits: Secondary | ICD-10-CM

## 2019-12-01 DIAGNOSIS — Z7952 Long term (current) use of systemic steroids: Secondary | ICD-10-CM

## 2019-12-01 DIAGNOSIS — Y848 Other medical procedures as the cause of abnormal reaction of the patient, or of later complication, without mention of misadventure at the time of the procedure: Secondary | ICD-10-CM | POA: Diagnosis present

## 2019-12-01 DIAGNOSIS — E7800 Pure hypercholesterolemia, unspecified: Secondary | ICD-10-CM | POA: Diagnosis present

## 2019-12-01 DIAGNOSIS — I469 Cardiac arrest, cause unspecified: Secondary | ICD-10-CM | POA: Diagnosis not present

## 2019-12-01 DIAGNOSIS — D461 Refractory anemia with ring sideroblasts: Secondary | ICD-10-CM | POA: Diagnosis present

## 2019-12-01 DIAGNOSIS — J984 Other disorders of lung: Secondary | ICD-10-CM | POA: Diagnosis present

## 2019-12-01 DIAGNOSIS — I25110 Atherosclerotic heart disease of native coronary artery with unstable angina pectoris: Secondary | ICD-10-CM | POA: Diagnosis present

## 2019-12-01 DIAGNOSIS — Z87891 Personal history of nicotine dependence: Secondary | ICD-10-CM

## 2019-12-01 DIAGNOSIS — I10 Essential (primary) hypertension: Secondary | ICD-10-CM | POA: Diagnosis not present

## 2019-12-01 DIAGNOSIS — Z66 Do not resuscitate: Secondary | ICD-10-CM | POA: Diagnosis not present

## 2019-12-01 DIAGNOSIS — Z8349 Family history of other endocrine, nutritional and metabolic diseases: Secondary | ICD-10-CM

## 2019-12-01 DIAGNOSIS — Z9981 Dependence on supplemental oxygen: Secondary | ICD-10-CM | POA: Diagnosis not present

## 2019-12-01 DIAGNOSIS — R0602 Shortness of breath: Secondary | ICD-10-CM

## 2019-12-01 DIAGNOSIS — R161 Splenomegaly, not elsewhere classified: Secondary | ICD-10-CM | POA: Diagnosis present

## 2019-12-01 DIAGNOSIS — J189 Pneumonia, unspecified organism: Secondary | ICD-10-CM | POA: Diagnosis not present

## 2019-12-01 DIAGNOSIS — J9601 Acute respiratory failure with hypoxia: Secondary | ICD-10-CM | POA: Diagnosis not present

## 2019-12-01 DIAGNOSIS — Z515 Encounter for palliative care: Secondary | ICD-10-CM | POA: Diagnosis not present

## 2019-12-01 DIAGNOSIS — J188 Other pneumonia, unspecified organism: Secondary | ICD-10-CM | POA: Diagnosis present

## 2019-12-01 DIAGNOSIS — K7689 Other specified diseases of liver: Secondary | ICD-10-CM | POA: Diagnosis present

## 2019-12-01 DIAGNOSIS — Z7901 Long term (current) use of anticoagulants: Secondary | ICD-10-CM | POA: Diagnosis not present

## 2019-12-01 DIAGNOSIS — Z8249 Family history of ischemic heart disease and other diseases of the circulatory system: Secondary | ICD-10-CM

## 2019-12-01 DIAGNOSIS — D5913 Mixed type autoimmune hemolytic anemia: Secondary | ICD-10-CM | POA: Diagnosis present

## 2019-12-01 DIAGNOSIS — D471 Chronic myeloproliferative disease: Secondary | ICD-10-CM | POA: Diagnosis present

## 2019-12-01 DIAGNOSIS — D5912 Cold autoimmune hemolytic anemia: Secondary | ICD-10-CM | POA: Diagnosis not present

## 2019-12-01 DIAGNOSIS — I4891 Unspecified atrial fibrillation: Secondary | ICD-10-CM

## 2019-12-01 DIAGNOSIS — D649 Anemia, unspecified: Secondary | ICD-10-CM | POA: Diagnosis present

## 2019-12-01 DIAGNOSIS — Z882 Allergy status to sulfonamides status: Secondary | ICD-10-CM

## 2019-12-01 DIAGNOSIS — I480 Paroxysmal atrial fibrillation: Secondary | ICD-10-CM | POA: Diagnosis present

## 2019-12-01 DIAGNOSIS — B948 Sequelae of other specified infectious and parasitic diseases: Secondary | ICD-10-CM | POA: Diagnosis not present

## 2019-12-01 DIAGNOSIS — E785 Hyperlipidemia, unspecified: Secondary | ICD-10-CM | POA: Diagnosis present

## 2019-12-01 DIAGNOSIS — D84821 Immunodeficiency due to drugs: Secondary | ICD-10-CM | POA: Diagnosis present

## 2019-12-01 DIAGNOSIS — Z79899 Other long term (current) drug therapy: Secondary | ICD-10-CM

## 2019-12-01 DIAGNOSIS — Z833 Family history of diabetes mellitus: Secondary | ICD-10-CM

## 2019-12-01 DIAGNOSIS — J9621 Acute and chronic respiratory failure with hypoxia: Secondary | ICD-10-CM | POA: Diagnosis present

## 2019-12-01 DIAGNOSIS — I471 Supraventricular tachycardia: Secondary | ICD-10-CM | POA: Diagnosis not present

## 2019-12-01 DIAGNOSIS — Z9889 Other specified postprocedural states: Secondary | ICD-10-CM

## 2019-12-01 DIAGNOSIS — I119 Hypertensive heart disease without heart failure: Secondary | ICD-10-CM | POA: Diagnosis present

## 2019-12-01 DIAGNOSIS — D469 Myelodysplastic syndrome, unspecified: Secondary | ICD-10-CM | POA: Diagnosis not present

## 2019-12-01 DIAGNOSIS — Z888 Allergy status to other drugs, medicaments and biological substances status: Secondary | ICD-10-CM

## 2019-12-01 DIAGNOSIS — D599 Acquired hemolytic anemia, unspecified: Secondary | ICD-10-CM | POA: Diagnosis not present

## 2019-12-01 DIAGNOSIS — D589 Hereditary hemolytic anemia, unspecified: Secondary | ICD-10-CM | POA: Diagnosis not present

## 2019-12-01 DIAGNOSIS — R509 Fever, unspecified: Secondary | ICD-10-CM | POA: Diagnosis not present

## 2019-12-01 LAB — DIFFERENTIAL
Abs Immature Granulocytes: 0.1 10*3/uL — ABNORMAL HIGH (ref 0.00–0.07)
Basophils Absolute: 0 10*3/uL (ref 0.0–0.1)
Basophils Relative: 0 %
Eosinophils Absolute: 0 10*3/uL (ref 0.0–0.5)
Eosinophils Relative: 0 %
Immature Granulocytes: 1 %
Lymphocytes Relative: 19 %
Lymphs Abs: 1.6 10*3/uL (ref 0.7–4.0)
Monocytes Absolute: 0.3 10*3/uL (ref 0.1–1.0)
Monocytes Relative: 3 %
Neutro Abs: 6.7 10*3/uL (ref 1.7–7.7)
Neutrophils Relative %: 77 %

## 2019-12-01 LAB — PREPARE RBC (CROSSMATCH)

## 2019-12-01 LAB — CBC
HCT: 16.2 % — ABNORMAL LOW (ref 36.0–46.0)
Hemoglobin: 5.8 g/dL — CL (ref 12.0–15.0)
MCH: 34.3 pg — ABNORMAL HIGH (ref 26.0–34.0)
MCHC: 35.8 g/dL (ref 30.0–36.0)
MCV: 95.9 fL (ref 80.0–100.0)
Platelets: 313 10*3/uL (ref 150–400)
RBC: 1.69 MIL/uL — ABNORMAL LOW (ref 3.87–5.11)
RDW: 21.6 % — ABNORMAL HIGH (ref 11.5–15.5)
WBC: 7.4 10*3/uL (ref 4.0–10.5)
nRBC: 3.8 % — ABNORMAL HIGH (ref 0.0–0.2)

## 2019-12-01 LAB — COMPREHENSIVE METABOLIC PANEL
ALT: 25 U/L (ref 0–44)
AST: 15 U/L (ref 15–41)
Albumin: 3.5 g/dL (ref 3.5–5.0)
Alkaline Phosphatase: 44 U/L (ref 38–126)
Anion gap: 11 (ref 5–15)
BUN: 29 mg/dL — ABNORMAL HIGH (ref 8–23)
CO2: 22 mmol/L (ref 22–32)
Calcium: 8.7 mg/dL — ABNORMAL LOW (ref 8.9–10.3)
Chloride: 103 mmol/L (ref 98–111)
Creatinine, Ser: 0.93 mg/dL (ref 0.44–1.00)
GFR calc Af Amer: >60 mL/min (ref >60)
GFR calc non Af Amer: >60 mL/min (ref >60)
Glucose, Bld: 140 mg/dL — ABNORMAL HIGH (ref 70–99)
Potassium: 3.9 mmol/L (ref 3.5–5.1)
Sodium: 136 mmol/L (ref 135–145)
Total Bilirubin: 3.5 mg/dL — ABNORMAL HIGH (ref 0.3–1.2)
Total Protein: 6.3 g/dL — ABNORMAL LOW (ref 6.5–8.1)

## 2019-12-01 LAB — LACTATE DEHYDROGENASE: LDH: 529 U/L — ABNORMAL HIGH (ref 98–192)

## 2019-12-01 LAB — RETICULOCYTES
Immature Retic Fract: 52.7 % — ABNORMAL HIGH (ref 2.3–15.9)
RBC.: 1.98 MIL/uL — ABNORMAL LOW (ref 3.87–5.11)
Retic Count, Absolute: 82 10*3/uL (ref 19.0–186.0)
Retic Ct Pct: 4.1 % — ABNORMAL HIGH (ref 0.4–3.1)

## 2019-12-01 MED ORDER — DILTIAZEM LOAD VIA INFUSION
10.0000 mg | Freq: Once | INTRAVENOUS | Status: DC
  Filled 2019-12-01: qty 10

## 2019-12-01 MED ORDER — DILTIAZEM HCL 25 MG/5ML IV SOLN
10.0000 mg | Freq: Once | INTRAVENOUS | Status: DC
  Filled 2019-12-01: qty 5

## 2019-12-01 MED ORDER — ACETAMINOPHEN 325 MG PO TABS
650.0000 mg | ORAL_TABLET | Freq: Four times a day (QID) | ORAL | Status: DC | PRN
  Administered 2019-12-01 – 2019-12-03 (×4): 650 mg via ORAL
  Filled 2019-12-01 (×4): qty 2

## 2019-12-01 MED ORDER — PREDNISONE 20 MG PO TABS
60.0000 mg | ORAL_TABLET | Freq: Every day | ORAL | Status: DC
  Administered 2019-12-02 – 2019-12-03 (×2): 60 mg via ORAL
  Filled 2019-12-01 (×3): qty 3

## 2019-12-01 MED ORDER — BENZONATATE 100 MG PO CAPS
100.0000 mg | ORAL_CAPSULE | Freq: Two times a day (BID) | ORAL | Status: DC | PRN
  Administered 2019-12-01 – 2019-12-03 (×4): 100 mg via ORAL
  Filled 2019-12-01 (×4): qty 1

## 2019-12-01 MED ORDER — FOLIC ACID 1 MG PO TABS
5.0000 mg | ORAL_TABLET | Freq: Every day | ORAL | Status: DC
  Administered 2019-12-01 – 2019-12-03 (×3): 5 mg via ORAL
  Filled 2019-12-01 (×3): qty 5

## 2019-12-01 MED ORDER — SODIUM CHLORIDE 0.9% IV SOLUTION: Freq: Once | INTRAVENOUS | Status: AC

## 2019-12-01 MED ORDER — ACETAMINOPHEN 650 MG RE SUPP: 650.0000 mg | Freq: Four times a day (QID) | RECTAL | Status: DC | PRN

## 2019-12-01 MED ORDER — SODIUM CHLORIDE 0.9 % IV SOLN
1.0000 g | INTRAVENOUS | Status: DC
  Administered 2019-12-01 – 2019-12-03 (×3): 1 g via INTRAVENOUS
  Filled 2019-12-01 (×2): qty 1
  Filled 2019-12-01 (×2): qty 10

## 2019-12-01 MED ORDER — POLYETHYLENE GLYCOL 3350 17 G PO PACK: 17.0000 g | PACK | Freq: Every day | ORAL | Status: DC | PRN

## 2019-12-01 MED ORDER — SODIUM CHLORIDE 0.9 % IV SOLN
500.0000 mg | INTRAVENOUS | Status: DC
  Administered 2019-12-01 – 2019-12-03 (×3): 500 mg via INTRAVENOUS
  Filled 2019-12-01 (×5): qty 500

## 2019-12-01 MED ORDER — SODIUM CHLORIDE 0.9 % IV BOLUS
1000.0000 mL | Freq: Once | INTRAVENOUS | Status: AC
  Administered 2019-12-01: 1000 mL via INTRAVENOUS

## 2019-12-01 MED ORDER — FENOFIBRATE 160 MG PO TABS
160.0000 mg | ORAL_TABLET | Freq: Every day | ORAL | Status: DC
  Administered 2019-12-01 – 2019-12-03 (×3): 160 mg via ORAL
  Filled 2019-12-01 (×4): qty 1

## 2019-12-01 MED ORDER — GUAIFENESIN ER 600 MG PO TB12
600.0000 mg | ORAL_TABLET | Freq: Two times a day (BID) | ORAL | Status: DC
  Administered 2019-12-01 – 2019-12-03 (×5): 600 mg via ORAL
  Filled 2019-12-01 (×6): qty 1

## 2019-12-01 MED ORDER — DILTIAZEM HCL-DEXTROSE 125-5 MG/125ML-% IV SOLN (PREMIX)
5.0000 mg/h | INTRAVENOUS | Status: DC
  Administered 2019-12-01: 5 mg/h via INTRAVENOUS
  Administered 2019-12-03: 15 mg/h via INTRAVENOUS
  Administered 2019-12-03: 03:00:00 5 mg/h via INTRAVENOUS
  Administered 2019-12-04: 02:00:00 10 mg/h via INTRAVENOUS
  Administered 2019-12-04: 15 mg/h via INTRAVENOUS
  Filled 2019-12-01 (×6): qty 125

## 2019-12-01 NOTE — ED Triage Notes (Signed)
Pt presents with c/o cough. Pt reports that she had Covid approx one month ago and has not been able get over the cough. Pt reports she was here yesterday and should have been admitted but was not. Pt is on 2L of O2 at home since having Covid.

## 2019-12-01 NOTE — ED Provider Notes (Addendum)
[facility] DEPT Provider Note   CSN: [id number] Arrival date & time: 12/12/2019  1218     History Chief Complaint  Patient presents with   Cough    Alexandra Pierce is a 74 y.o. female with PMH significant for paroxysmal atrial fibrillation on Xarelto, HTN, myelodysplastic syndrome, anemia, HTN, HLD, CVA, and COVID-19 infection with multifocal pneumonia diagnosed 11/05/2019 who presents to the ED with complaints of lingering cough and oxygen requirements.  Patient had presented to the ED for lab work yesterday with a plan for blood transfusion for Monday, 12/03/2019.  However, her presentation to the ED today is primarily for her lingering cough and dyspnea, particularly on exertion.  She has been on 2 L supplemental oxygen since her discharge from the hospital in 11/08/2019.  She believes that her baseline hemoglobin is 8.5 or greater and she has been feeling particularly fatigued with elevated heart rate which she attributes to her current anemia.  She denies any chest pain, pleuritic chest discomfort, leg swelling, abdominal pain, nausea vomiting, dizziness, weakness or numbness, or other symptoms.   HPI     Past Medical History:  Diagnosis Date   Chronic lower back pain    Hypercholesteremia    Hypertension    mild   Obesity    Paroxysmal atrial fibrillation ([place])    managed with anticoagulation and rate control.    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    Counseling regarding advance care planning and goals of care 08/16/2019   Elevated serum lactate dehydrogenase (LDH)    Renal lesion    Syncope 05/22/2018   Symptomatic anemia 05/22/2018   Unstable angina (HCC)    Chest pain 03/16/2016   ARF (acute renal failure) ([place]) 03/16/2016   Coronary artery disease involving native coronary artery of native heart with unstable angina pectoris ([place])    Acute CVA (cerebrovascular accident) ([place]) 01/12/2014   TIA (transient ischemic attack) 01/11/2014   COLONIC POLYPS 12/15/2007   Hyperlipidemia 12/15/2007   INTERNAL HEMORRHOIDS WITHOUT MENTION COMP 12/15/2007   SINUSITIS, CHRONIC 12/15/2007   CONSTIPATION 12/15/2007   RECTAL BLEEDING 12/15/2007   OSTEOARTHRITIS 12/15/2007   Essential hypertension 07/18/2007   Atrial fibrillation ([place]) 07/18/2007   History of cardiovascular disorder 07/18/2007    Past Surgical History:  Procedure Laterality Date   CARDIAC CATHETERIZATION  03/31/2000   EF 49%/LAD lg vessel which coursed to the apex & gave rise to 2 diagonal branches/ LAD noted to have 30% ostial lesion & tapers to a sm vessel toward the apex but no high grade lesion/1st diagonal med sized vessel & 2nd diagonal sm vessel with no significant disease/ lt ventriculogram reveals mild global hypokinesis w/ an estimated EF of 45-50%   CARDIAC CATHETERIZATION N/A 03/17/2016   Procedure: Left Heart Cath and Coronary Angiography;  Surgeon: Wellington Hampshire, MD;  Location: [facility];  Service: Cardiovascular;  Laterality: N/A;   LEFT HEART CATH AND CORONARY ANGIOGRAPHY N/A 03/31/2018   Procedure: LEFT HEART CATH AND CORONARY ANGIOGRAPHY;  Surgeon: Martinique, Peter M, MD;  Location: [facility];  Service: Cardiovascular;  Laterality: N/A;     OB History   No obstetric history on file.     Family History  Problem Relation Age of Onset   Heart disease Father    Asthma Father    Diabetes Father    Heart disease  Mother    Heart disease Brother    Hyperlipidemia Sister    Hyperlipidemia Brother    Hyperlipidemia Brother    Cancer Neg Hx     Social History   Tobacco Use   Smoking status: Former Smoker    Packs/day: 0.12    Years: 15.00    Pack years: 1.80    Types: Cigarettes    Quit date: 10/18/1978    Years since quitting: 41.1   Smokeless tobacco: Never Used  Substance Use Topics   Alcohol use: Yes    Alcohol/week: 14.0 standard drinks    Types: 14 Glasses of wine per week   Drug use: No    Home Medications Prior to Admission medications   Medication Sig Start Date End Date Taking? Authorizing Provider  Ascorbic Acid (VITAMIN C PO) Take 1 tablet by mouth daily.    [provider]  b complex vitamins capsule Take 1 capsule by mouth daily.     [provider]  benzonatate (TESSALON) 100 MG capsule Take 1 capsule (100 mg total) by mouth every 8 (eight) hours as needed for cough. 10/03/19   Corena Herter, PA-C  cholecalciferol (VITAMIN D) 1000 UNITS tablet Take 1,000 Units by mouth daily.    [provider]  cyanocobalamin 1000 MCG tablet Take 1,000 mcg by mouth daily.     [provider]  fenofibrate 160 MG tablet Take 1 tablet (160 mg total) by mouth daily. 02/16/19   Burtis Junes, NP  fluticasone (FLONASE) 50 MCG/ACT nasal spray Place 1 spray into both nostrils daily.    [provider]  folic acid (FOLVITE) 1 MG tablet Take 5 tablets (5 mg total) by mouth daily. Patient taking differently: Take 2 mg by mouth daily.  10/13/19   Alma Friendly, MD  metoprolol tartrate (LOPRESSOR) 25 MG tablet Take 0.5 tablets (12.5 mg total) by mouth 2 (two) times daily. 11/08/19   Swayze, Ava, DO  predniSONE (DELTASONE) 20 MG tablet Take 3 tablets (60 mg total) by mouth daily with breakfast. 11/08/19   Swayze, Ava, DO  XARELTO 20 MG TABS tablet TAKE 1 TABLET BY MOUTH ONCE DAILY Patient taking differently: Take 20 mg by mouth daily.  04/18/19    Martinique, Peter M, MD    Allergies    Sulfonamide derivatives, Lipitor [atorvastatin], Pravachol [pravastatin sodium], and Statins  Review of Systems   Review of Systems  Constitutional: Negative for chills and fever.  Respiratory: Positive for cough and shortness of breath.   Cardiovascular: Negative for chest pain and leg swelling.  Gastrointestinal: Negative for nausea.  Neurological: Positive for weakness and light-headedness. Negative for dizziness.    Physical Exam Updated Vital Signs BP 104/61    Pulse (!) 136    Temp 99.1 F (37.3 C) (Oral)    Resp 20    SpO2 96%   Physical Exam Vitals and nursing note reviewed. Exam conducted with a chaperone present.  Constitutional:      Appearance: Normal appearance.  HENT:     Head: Normocephalic and atraumatic.  Eyes:     General: No scleral icterus.  Conjunctiva/sclera: Conjunctivae normal.  Cardiovascular:     Rate and Rhythm: Normal rate and regular rhythm.     Pulses: Normal pulses.     Heart sounds: Normal heart sounds.  Pulmonary:     Effort: Pulmonary effort is normal. No respiratory distress.     Breath sounds: Rales present.  Musculoskeletal:     Cervical back: Normal range of motion and neck supple. No rigidity.     Right lower leg: No edema.     Left lower leg: No edema.  Skin:    General: Skin is dry.  Neurological:     Mental Status: She is alert and oriented to person, place, and time.     GCS: GCS eye subscore is 4. GCS verbal subscore is 5. GCS motor subscore is 6.  Psychiatric:        Mood and Affect: Mood normal.        Behavior: Behavior normal.        Thought Content: Thought content normal.      ED Results / Procedures / Treatments   Labs (all labs ordered are listed, but only abnormal results are displayed) Labs Reviewed  CBC - Abnormal; Notable for the following components:      Result Value   RBC 1.69 (*)    Hemoglobin 5.8 (*)    HCT 16.2 (*)    MCH 34.3 (*)    RDW 21.6 (*)    nRBC  3.8 (*)    All other components within normal limits  COMPREHENSIVE METABOLIC PANEL - Abnormal; Notable for the following components:   Glucose, Bld 140 (*)    BUN 29 (*)    Calcium 8.7 (*)    Total Protein 6.3 (*)    Total Bilirubin 3.5 (*)    All other components within normal limits    EKG None  Radiology DG Chest 2 View  Result Date: 11/30/2019 CLINICAL DATA:  Shortness of breath EXAM: CHEST - 2 VIEW COMPARISON:  11/05/2019 FINDINGS: Cardiomegaly. Bibasilar atelectasis. No overt edema. Patchy peripheral opacity again noted in the left lower lung, unchanged since prior study. IMPRESSION: Cardiomegaly, bibasilar atelectasis. Continued patchy peripheral opacity in the left lower lung could reflect pneumonia. Electronically Signed   By: Rolm Baptise M.D.   On: 11/30/2019 19:08   DG Chest Portable 1 View  Result Date: 12/15/2019 CLINICAL DATA:  Cough EXAM: PORTABLE CHEST 1 VIEW COMPARISON:  November 30, 2019 FINDINGS: The mediastinal contour and cardiac silhouette are normal. Patchy opacities of bilateral lung bases are noted. There is no pleural effusion. The bony structures are stable. IMPRESSION: Patchy opacities of bilateral lung bases, suspicious for pneumonia. Electronically Signed   By: Abelardo Diesel M.D.   On: 12/12/2019 13:57    Procedures .Critical Care Performed by: Corena Herter, PA-C Authorized by: Corena Herter, PA-C   Critical care provider statement:    Critical care time (minutes):  30   Critical care was necessary to treat or prevent imminent or life-threatening deterioration of the following conditions:  Circulatory failure   Critical care was time spent personally by me on the following activities:  Discussions with consultants, evaluation of patient's response to treatment, examination of patient, ordering and performing treatments and interventions, ordering and review of laboratory studies, ordering and review of radiographic studies, pulse oximetry,  re-evaluation of patient's condition, obtaining history from patient or surrogate and review of old charts   (including critical care time)  Medications Ordered in ED Medications  diltiazem (  CARDIZEM) injection 10 mg (has no administration in time range)    ED Course  I have reviewed the triage vital signs and the nursing notes.  Pertinent labs & imaging results that were available during my care of the patient were reviewed by me and considered in my medical decision making (see chart for details).  Clinical Course as of Dec 01 1611  Sat Dec 01, 2019  1455 Spoke with hospitalist and will have patient admitted for her symptomatic anemia.    [GG]    Clinical Course User Index [GG] Corena Herter, PA-C   MDM Rules/Calculators/A&P                      Patient is complaining of significant dyspnea, particular on exertion.  She attributes this to her recent COVID-19 infection, however it is much more likely that this is attributable to her hemoglobin of 5.8.  She had already been scheduled for blood transfusion to occur on Monday, 12/03/2019, however given her worsening blood counts would like to get her admitted for management including blood transfusion.  Her last blood transfusion was 11/22/2019.  DG chest portable does demonstrate a persistent multifocal pneumonia.  EKG demonstrates atrial fibrillation with elevated rate. Will provide Cardizem 10 mg IV.  Spoke with hospitalist, Dr. Neysa Bonito, who will evaluate patient and have admitted for her symptomatic anemia.   Final Clinical Impression(s) / ED Diagnoses Final diagnoses:  Symptomatic anemia    Rx / DC Orders ED Discharge Orders    None       Corena Herter, PA-C 11/27/2019 1520    Corena Herter, PA-C 11/23/2019 1614    Margette Fast, MD 12/02/19 [phone]

## 2019-12-01 NOTE — Progress Notes (Signed)
Received patient from the floor.  A&O x4.  No complaints.  BP low, systolics in the [id number] and [id number] MAPS 50-60's.  HR 120s, afib with six beats of vtach.  2 units PRBC ordered, spoke to lab who states that due to the patient's complicated antibodies blood will not be here till at least 1am.  Patient states she needs a blood warmer.  Blood warmer obtained from the ED.  [facility] [place] paged for further recs regarding BP.

## 2019-12-01 NOTE — H&P (Addendum)
History and Physical        Hospital Admission Note Date: 11/19/2019  Patient name: Alexandra Pierce Medical record number: [id number] Date of birth: [birth year] Age: 74 y.o. Gender: female  PCP: Chesley Noon, MD  Patient coming from: Home Lives with: Alone At baseline, ambulates: With cane  Chief Complaint    Cough, shortness of breath   HPI:   This is a 74 year old female with a history paroxysmal atrial fibrillation, hypertension, hyperlipidemia, CVA, MDS/MPN JAK 2 positive follows with Dr. Irene Limbo recent admission from 1/18-1/21 for symptomatic anemia requiring IVIG and found to have COVID 19 infection at that time and on prednisone 60 mg daily, discharged with 2L/min O2 via [facility] at the time who presented with worsening cough and shortness of breath x4 days, found to have Hb 5.8 at presentation.  Notable ED labs: Glucose 140, BUN/creatinine 29/0.93, Hb 5.8 ED treatments: Cardizem bolus ED imaging: CXR with patchy opacities of bilateral lung bases suspicious for pneumonia ED vitals:  Vitals:   11/28/2019 1245 12/08/2019 1500  BP: 134/87 104/61  Pulse: (!) 130 (!) 136  Resp: 20   Temp:    SpO2: 98% 96%     Review of Systems:  Review of Systems  Constitutional: Negative for chills and fever.  HENT: Positive for nosebleeds.        Occasional minimal nosebleeds on O2  Eyes: Negative.  Negative for double vision.  Respiratory: Positive for cough and shortness of breath.   Cardiovascular: Negative for chest pain and palpitations.  Gastrointestinal: Negative for blood in stool, melena, nausea and vomiting.  Genitourinary: Negative for dysuria and urgency.  Musculoskeletal: Negative for falls and myalgias.  Neurological: Negative.  Negative for headaches.  Endo/Heme/Allergies: Negative.  Does not bruise/bleed easily.  Psychiatric/Behavioral: Negative.   All other systems  reviewed and are negative.   Medical/Social/Family History   Past Medical History: Past Medical History:  Diagnosis Date  . Chronic lower back pain   . Hypercholesteremia   . Hypertension    mild  . Obesity   . Paroxysmal atrial fibrillation (HCC)    managed with anticoagulation and rate control.   . Stroke [facility]) 2001 & 2015   "right side gets weak sometimes if I'm only tired" (03/16/2016)    Past Surgical History:  Procedure Laterality Date  . CARDIAC CATHETERIZATION  03/31/2000   EF 49%/LAD lg vessel which coursed to the apex & gave rise to 2 diagonal branches/ LAD noted to have 30% ostial lesion & tapers to a sm vessel toward the apex but no high grade lesion/1st diagonal med sized vessel & 2nd diagonal sm vessel with no significant disease/ lt ventriculogram reveals mild global hypokinesis w/ an estimated EF of 45-50%  . CARDIAC CATHETERIZATION N/A 03/17/2016   Procedure: Left Heart Cath and Coronary Angiography;  Surgeon: Wellington Hampshire, MD;  Location: [place] [facility];  Service: Cardiovascular;  Laterality: N/A;  . LEFT HEART CATH AND CORONARY ANGIOGRAPHY N/A 03/31/2018   Procedure: LEFT HEART CATH AND CORONARY ANGIOGRAPHY;  Surgeon: Martinique, Peter M, MD;  Location: [place] [facility];  Service: Cardiovascular;  Laterality: N/A;    Medications: Prior to Admission medications   Medication Sig  Start Date End Date Taking? Authorizing Provider  Ascorbic Acid (VITAMIN C PO) Take 1 tablet by mouth daily.    [provider]  b complex vitamins capsule Take 1 capsule by mouth daily.     [provider]  benzonatate (TESSALON) 100 MG capsule Take 1 capsule (100 mg total) by mouth every 8 (eight) hours as needed for cough. 10/03/19   Corena Herter, PA-C  cholecalciferol (VITAMIN D) 1000 UNITS tablet Take 1,000 Units by mouth daily.    [provider]  cyanocobalamin 1000 MCG tablet Take 1,000 mcg by mouth daily.     [provider]  fenofibrate  160 MG tablet Take 1 tablet (160 mg total) by mouth daily. 02/16/19   Burtis Junes, NP  fluticasone (FLONASE) 50 MCG/ACT nasal spray Place 1 spray into both nostrils daily.    [provider]  folic acid (FOLVITE) 1 MG tablet Take 5 tablets (5 mg total) by mouth daily. Patient taking differently: Take 2 mg by mouth daily.  10/13/19   Alma Friendly, MD  metoprolol tartrate (LOPRESSOR) 25 MG tablet Take 0.5 tablets (12.5 mg total) by mouth 2 (two) times daily. 11/08/19   Swayze, Ava, DO  predniSONE (DELTASONE) 20 MG tablet Take 3 tablets (60 mg total) by mouth daily with breakfast. 11/08/19   Swayze, Ava, DO  XARELTO 20 MG TABS tablet TAKE 1 TABLET BY MOUTH ONCE DAILY Patient taking differently: Take 20 mg by mouth daily.  04/18/19   [place], Peter M, MD    Allergies:   Allergies  Allergen Reactions  . Sulfonamide Derivatives Anaphylaxis  . Lipitor [Atorvastatin] Other (See Comments)    Caused bladder infection  . Pravachol [Pravastatin Sodium] Other (See Comments)    Myalgias  . Statins Other (See Comments)    Myalgia    Social History:  reports that she quit smoking about 41 years ago. Her smoking use included cigarettes. She has a 1.80 pack-year smoking history. She has never used smokeless tobacco. She reports current alcohol use of about 14.0 standard drinks of alcohol per week. She reports that she does not use drugs.  Family History: Family History  Problem Relation Age of Onset  . Heart disease Father   . Asthma Father   . Diabetes Father   . Heart disease Mother   . Heart disease Brother   . Hyperlipidemia Sister   . Hyperlipidemia Brother   . Hyperlipidemia Brother   . Cancer Neg Hx      Objective   Physical Exam: Blood pressure 104/61, pulse (!) 136, temperature 99.1 F (37.3 C), temperature source Oral, resp. rate 20, SpO2 96 %.  Physical Exam Vitals and nursing note reviewed.  Constitutional:      Appearance: Normal appearance. She is not  ill-appearing or diaphoretic.  HENT:     Head: Normocephalic and atraumatic.     Nose:     Comments: Nasal cannula Eyes:     Conjunctiva/sclera: Conjunctivae normal.  Cardiovascular:     Rate and Rhythm: Tachycardia present. Rhythm irregular.     Heart sounds: No murmur.  Pulmonary:     Comments: Some conversational dyspnea Dry cough Abdominal:     General: Abdomen is flat.     Palpations: Abdomen is soft.  Musculoskeletal:        General: No swelling or tenderness.  Skin:    Coloration: Skin is not jaundiced or pale.  Neurological:     Mental Status: She is alert. Mental status  is at baseline.  Psychiatric:        Mood and Affect: Mood normal.        Behavior: Behavior normal.     LABS on Admission: I have personally reviewed all the labs and imaging below    Basic Metabolic Panel: Recent Labs  Lab 11/28/19 0810 12/10/2019 1403  NA 140 136  K 5.1 3.9  CL 106 103  CO2 21* 22  GLUCOSE 140* 140*  BUN 24* 29*  CREATININE 0.85 0.93  CALCIUM 7.8* 8.7*   Liver Function Tests: Recent Labs  Lab 11/28/19 0810 11/26/2019 1403  AST 22 15  ALT 23 25  ALKPHOS 44 44  BILITOT 4.0* 3.5*  PROT 5.7* 6.3*  ALBUMIN 3.4* 3.5   No results for input(s): LIPASE, AMYLASE in the last 168 hours. No results for input(s): AMMONIA in the last 168 hours. CBC: Recent Labs  Lab 11/28/19 0810 11/28/19 0810 11/28/2019 1403  WBC 4.5  --  7.4  NEUTROABS 3.6  --   --   HGB 6.3*  --  5.8*  HCT 18.2*  --  16.2*  MCV 95.3   < > 95.9  PLT 311  --  313   < > = values in this interval not displayed.   Cardiac Enzymes: No results for input(s): CKTOTAL, CKMB, CKMBINDEX, TROPONINI in the last 168 hours. BNP: Invalid input(s): POCBNP CBG: No results for input(s): GLUCAP in the last 168 hours.  Radiological Exams on Admission:  DG Chest 2 View  Result Date: 11/30/2019 CLINICAL DATA:  Shortness of breath EXAM: CHEST - 2 VIEW COMPARISON:  11/05/2019 FINDINGS: Cardiomegaly. Bibasilar  atelectasis. No overt edema. Patchy peripheral opacity again noted in the left lower lung, unchanged since prior study. IMPRESSION: Cardiomegaly, bibasilar atelectasis. Continued patchy peripheral opacity in the left lower lung could reflect pneumonia. Electronically Signed   By: Rolm Baptise M.D.   On: 11/30/2019 19:08   DG Chest Portable 1 View  Result Date: 11/30/2019 CLINICAL DATA:  Cough EXAM: PORTABLE CHEST 1 VIEW COMPARISON:  November 30, 2019 FINDINGS: The mediastinal contour and cardiac silhouette are normal. Patchy opacities of bilateral lung bases are noted. There is no pleural effusion. The bony structures are stable. IMPRESSION: Patchy opacities of bilateral lung bases, suspicious for pneumonia. Electronically Signed   By: Abelardo Diesel M.D.   On: 12/16/2019 13:57      EKG: Independently reviewed.  Normal axis, atrial fibrillation with RVR, no pathologic ST changes   A & P   Active Problems:   Symptomatic anemia   1. Shortness of breath secondary to symptomatic anemia, likely from acutely worsened JAK2 positive MDS/MPN a. Hb 5.8 without signs of bleed b. Transfuse 2 units PRBC with follow-up H&H c. Hematology/oncology consult d. Continue prednisone pending heme/onc recommendations e. Hold Xarelto f. FOBT g. Titrate O2 to>92 % 2. Atrial fibrillation with RVR a. Cardizem drip b. Will likely improve/resolve once Hb is corrected c. Holding home metoprolol until A. fib is controlled d. Hold home Xarelto e. Telemetry 3. Cough with history of COVID-19 a. COVID-19 + on 10/24/2019 b. CXR: Patchy opacity at bilateral lung bases, suspicious for pneumonia  c. Afebrile, no leukocytosis however immunosuppressed on chronic prednisone d. will start empiric CAP therapy, recommend reevaluating need for antibiotics in a.m. 4. 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 Hematology/oncology consult b. Check differential 5. Hyperlipidemia a. Continue  fenofibrate 6. Hypertension a. Continue Cardizem drip b. Holding home Lopressor, consider Coreg instead 7. CAD a.  Continue fenofibrate b. Holding aspirin  DVT prophylaxis: SCDs   Code Status: Full, discussed with patient at bedside Diet: Heart healthy Family Communication: Admission, patients condition and plan of care including tests being ordered have been discussed with the patient who indicates understanding and agrees with the plan and Code Status. Patient requested to update family on her own Disposition Plan: The appropriate patient status for this patient is INPATIENT. Inpatient status is judged to be reasonable and necessary in order to provide the required intensity of service to ensure the patient's safety. The patient's presenting symptoms, physical exam findings, and initial radiographic and laboratory data in the context of their chronic comorbidities is felt to place them at high risk for further clinical deterioration. Furthermore, it is not anticipated that the patient will be medically stable for discharge from the hospital within 2 midnights of admission. The following factors support the patient status of inpatient.   " The patient's presenting symptoms include shortness of breath, cough. " The worrisome physical exam findings include conversational dyspnea, atrial fibrillation with RVR on telemetry. " The initial radiographic and laboratory data are worrisome because of CXR with concerns of multifocal pneumonia. " The chronic co-morbidities include MDS, hypertension, recent COVID-19.   * I certify that at the point of admission it is my clinical judgment that the patient will require inpatient hospital care spanning beyond 2 midnights from the point of admission due to high intensity of service, high risk for further deterioration and high frequency of surveillance required.*    The medical decision making on this patient was of high complexity and the patient is at high  risk for clinical deterioration, therefore this is a level 3  admission.  Consultants  . Hematology/oncology  Procedures  . 2 unit PRBCs  Time Spent on Admission: 75 minutes minutes    Harold Hedge, DO [facility] Pager [phone] 12/05/2019, 4:57 PM

## 2019-12-02 DIAGNOSIS — D469 Myelodysplastic syndrome, unspecified: Secondary | ICD-10-CM

## 2019-12-02 DIAGNOSIS — D589 Hereditary hemolytic anemia, unspecified: Secondary | ICD-10-CM

## 2019-12-02 DIAGNOSIS — J189 Pneumonia, unspecified organism: Secondary | ICD-10-CM

## 2019-12-02 LAB — BASIC METABOLIC PANEL
Anion gap: 9 (ref 5–15)
BUN: 18 mg/dL (ref 8–23)
CO2: 20 mmol/L — ABNORMAL LOW (ref 22–32)
Calcium: 7.8 mg/dL — ABNORMAL LOW (ref 8.9–10.3)
Chloride: 108 mmol/L (ref 98–111)
Creatinine, Ser: 0.75 mg/dL (ref 0.44–1.00)
GFR calc Af Amer: >60 mL/min (ref >60)
GFR calc non Af Amer: >60 mL/min (ref >60)
Glucose, Bld: 120 mg/dL — ABNORMAL HIGH (ref 70–99)
Potassium: 3.8 mmol/L (ref 3.5–5.1)
Sodium: 137 mmol/L (ref 135–145)

## 2019-12-02 LAB — CBC
HCT: 22.5 % — ABNORMAL LOW (ref 36.0–46.0)
Hemoglobin: 7.1 g/dL — ABNORMAL LOW (ref 12.0–15.0)
MCH: 29.7 pg (ref 26.0–34.0)
MCHC: 31.6 g/dL (ref 30.0–36.0)
MCV: 94.1 fL (ref 80.0–100.0)
Platelets: 215 10*3/uL (ref 150–400)
RBC: 2.39 MIL/uL — ABNORMAL LOW (ref 3.87–5.11)
RDW: 19.2 % — ABNORMAL HIGH (ref 11.5–15.5)
WBC: 4.2 10*3/uL (ref 4.0–10.5)
nRBC: 2.6 % — ABNORMAL HIGH (ref 0.0–0.2)

## 2019-12-02 LAB — BILIRUBIN, FRACTIONATED(TOT/DIR/INDIR)
Bilirubin, Direct: 0.5 mg/dL — ABNORMAL HIGH (ref 0.0–0.2)
Indirect Bilirubin: 1.5 mg/dL — ABNORMAL HIGH (ref 0.3–0.9)
Total Bilirubin: 2 mg/dL — ABNORMAL HIGH (ref 0.3–1.2)

## 2019-12-02 MED ORDER — DIPHENHYDRAMINE HCL 25 MG PO CAPS
25.0000 mg | ORAL_CAPSULE | Freq: Once | ORAL | Status: AC | PRN
  Administered 2019-12-02: 25 mg via ORAL
  Filled 2019-12-02: qty 1

## 2019-12-02 MED ORDER — DIPHENHYDRAMINE HCL 25 MG PO CAPS: 25.0000 mg | ORAL_CAPSULE | Freq: Once | ORAL | Status: DC | PRN

## 2019-12-02 MED ORDER — CHLORHEXIDINE GLUCONATE CLOTH 2 % EX PADS
6.0000 | MEDICATED_PAD | Freq: Every day | CUTANEOUS | Status: DC
  Administered 2019-12-02 – 2019-12-04 (×3): 6 via TOPICAL

## 2019-12-02 NOTE — Progress Notes (Addendum)
IP PROGRESS NOTE  Subjective:   Alexandra Pierce is followed by Dr. Irene Limbo with a history of MDS/MPN and hemolytic anemia.  She was last transfused with packed red blood cells on 11/28/2019.  She presented to the emergency room yesterday with dyspnea and a cough.  She was found to have a hemoglobin of 5.8.  She was admitted for transfusion support and further evaluation. She has no complaint this morning.  She had a fever last night.  Objective: Vital signs in last 24 hours: Blood pressure (!) 106/54, pulse (!) 103, temperature (!) 100.9 F (38.3 C), temperature source Axillary, resp. rate (!) 24, height 5\' 6"  (1.676 m), weight 167 lb 8.8 oz (76 kg), SpO2 98 %.  Intake/Output from previous day: 02/13 0701 - 02/14 0700 In: 2137.2 [I.V.:116.8; Blood:630; IV Piggyback:1390.4] Out: 500 [Urine:500]  Physical Exam:  HEENT: No thrush Lungs: Few inspiratory/expiratory wheezes at the anterior chest bilaterally, no respiratory distress Cardiac: Regular rate and rhythm Abdomen: No hepatosplenomegaly Extremities: No leg edema   Lab Results: Recent Labs    11/30/2019 1403  WBC 7.4  HGB 5.8*  HCT 16.2*  PLT 313    BMET Recent Labs    12/06/2019 1403  NA 136  K 3.9  CL 103  CO2 22  GLUCOSE 140*  BUN 29*  CREATININE 0.93  CALCIUM 8.7*    No results found for: CEA1  Studies/Results: DG Chest 2 View  Result Date: 11/30/2019 CLINICAL DATA:  Shortness of breath EXAM: CHEST - 2 VIEW COMPARISON:  11/05/2019 FINDINGS: Cardiomegaly. Bibasilar atelectasis. No overt edema. Patchy peripheral opacity again noted in the left lower lung, unchanged since prior study. IMPRESSION: Cardiomegaly, bibasilar atelectasis. Continued patchy peripheral opacity in the left lower lung could reflect pneumonia. Electronically Signed   By: Rolm Baptise M.D.   On: 11/30/2019 19:08   DG Chest Portable 1 View  Result Date: 11/19/2019 CLINICAL DATA:  Cough EXAM: PORTABLE CHEST 1 VIEW COMPARISON:  November 30, 2019  FINDINGS: The mediastinal contour and cardiac silhouette are normal. Patchy opacities of bilateral lung bases are noted. There is no pleural effusion. The bony structures are stable. IMPRESSION: Patchy opacities of bilateral lung bases, suspicious for pneumonia. Electronically Signed   By: Abelardo Diesel M.D.   On: 12/16/2019 13:57    Medications: I have reviewed the patient's current medications.  Assessment/Plan: 1.  Myelodysplastic/myeloproliferative disorder, JAK2 positive 2.  History of hemolytic anemia 3.  Severe symptomatic anemia secondary to #1 and #2 4.  COVID-19 infection January 2021 5.  Atrial fibrillation   Alexandra Pierce is admitted with symptomatic anemia.  She received 2 units of packed red blood cells last night and the CBC from today is pending.  It appears the anemia is related to ongoing hemolysis and bone marrow failure.  She is not mounting a reticulocytosis.  Alexandra Pierce has been treated with rituximab and is currently maintained on prednisone.  Dr. Irene Limbo is considering additional immunosuppressive therapy.  She saw Dr. Leretha Pol at [facility] last week for a second opinion.  A cold antibody evaluation is pending.  She has a fever and cough.  She may have pneumonia.  Recommendations: 1.  Continue prednisone and folic acid 2.  Follow-up CBC from today 3.  continue antibiotics, evaluate for infection per the medical service 4.  Check CBC in a.m. 12/03/2019 5.  Dr. Irene Limbo will see her tomorrow    LOS: 1 day   Betsy Coder, MD   12/02/2019, 7:44 AM

## 2019-12-02 NOTE — Progress Notes (Signed)
Blood ready, consent obtained.  Awaiting call back from provider concerning fever of 100.2 forty minutes after receiving tylenol.

## 2019-12-02 NOTE — Progress Notes (Signed)
Patient typically gets pre medicated with tylenol and benadryl and uses a blood warmer.  Updated [place], orders received.

## 2019-12-02 NOTE — Progress Notes (Signed)
Second unit of blood finished infusing. VSS. Pt remains a Red MEWS, this is not an acute change for her. Resting in bed with no c/o pain at this time. Spoke with daughter on the phone this AM. Pt remains on 2L of O2 via [place]. No SOB noted. SpO2: 96%. HR A-fib 105 bpm on the monitor. Cardizem gtt running. No concerns at this time. Will cont to mx.

## 2019-12-02 NOTE — Progress Notes (Signed)
PROGRESS NOTE  Alexandra Pierce [id number] DOB: Mar 14, 1946 DOA: 11/24/2019 PCP: Chesley Noon, MD   LOS: 1 day   Brief narrative: As per HPI,  Patient is a 74 year old female with a history paroxysmal atrial fibrillation, hypertension, hyperlipidemia, CVA, MDS/MPN JAK 2 positive follows with Dr. Irene Limbo recent admission from 1/18-1/21 for symptomatic anemia requiring IVIG and found to have COVID 19 infection at that time and on prednisone 60 mg daily, discharged with 2L/min O2 via [place] at that time who presented to the hospital with worsening cough and shortness of breath x4 days, found to have Hb 5.8 at presentation.  Assessment/Plan:  Active Problems:   Symptomatic anemia  Shortness of breath secondary to symptomatic anemia, likely from acutely worsened JAK2 positive MDS/MPN and hemolytic anemia..  Patient received 2 units of packed RBC.  Hematology has been consulted.  Continue prednisone.  Hold Xarelto for now.  Check FOBT.  Currently on 2 L of nasal cannula and is at baseline.  No mention of bleeding reported.  Atrial fibrillation with RVR: No chest pain dizziness or lightheadedness.  Still on Cardizem drip.  Continue to monitor hemoglobin closely.  Hold Xarelto for now.   Cough with history of COVID-19: Patient was COVID-19 + on 10/24/2019.  Chest x-ray shows patchy opacities over the bilateral lung bases.  Patient is afebrile without any leukocytosis but is immunosuppressed on chronic prednisone.  Patient has been started on empiric antibiotics for community-acquired pneumonia.  Continue Rocephin and Zithromax.  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  Patient also has a history of hemolytic anemia.  Oncology recommend continuation of prednisone and folic acid.  Follow-up CBC, haptoglobin, bilirubin, direct antiglobulin test.  LDH was elevated at 529.  Patient was treated with rituximab and is currently maintained on prednisone as  outpatient.  Hyperlipidemia: Continue fenofibrate  Essential hypertension: Currently on Cardizem drip for atrial fibrillation.  Patient is on Lopressor at home.    History of CAD.  No acute chest pain.  Aspirin on hold.  Continue fenofibrate.  VTE Prophylaxis: Hold Xarelto for now.  SCD  Code Status: Full code  Family Communication: None.  Patient states that she has been communicating with her family and did not wish me to talk to call family.  Disposition Plan:  . Patient is from home . Likely disposition to home . Barriers to discharge: Blood transfusion, monitoring of CBC, IV antibiotic for community-acquired pneumonia, PT evaluation tomorrow   Consultants:  Hematooncology  Procedures:  Blood transfusion 2 units  Antibiotics:  . Rocephin and Zithromax 11/28/2019>  Anti-infectives (From admission, onward)   Start     Dose/Rate Route Frequency Ordered Stop   11/30/2019 1800  cefTRIAXone (ROCEPHIN) 1 g in sodium chloride 0.9 % 100 mL IVPB     1 g 200 mL/hr over 30 Minutes Intravenous Every 24 hours 12/13/2019 1646     12/11/2019 1800  azithromycin (ZITHROMAX) 500 mg in sodium chloride 0.9 % 250 mL IVPB     500 mg 250 mL/hr over 60 Minutes Intravenous Every 24 hours 11/29/2019 1646         Subjective: Today, patient was seen and examined at bedside.  Patient denies any chest pain, palpitation, has mild cough.  Was mildly febrile.  Denies any nausea, vomiting or abdominal pain.  She feels dyspneic on exertion.  Objective: Vitals:   12/02/19 0700 12/02/19 0715  BP: 104/67 (!) 106/54  Pulse: (!) 106 (!) 103  Resp: (!) 25 (!) 24  Temp:  (!) 100.9 F (38.3 C)  SpO2: 96% 98%    Intake/Output Summary (Last 24 hours) at 12/02/2019 0754 Last data filed at 12/02/2019 0700 Gross per 24 hour  Intake 2137.15 ml  Output 500 ml  Net 1637.15 ml   Filed Weights   11/30/2019 2011 12/02/19 0318  Weight: 72.5 kg 76 kg   Body mass index is 27.04 kg/m.   Physical Exam: GENERAL:  Patient is alert awake and oriented. Not in obvious distress.  On 2 L of nasal cannula oxygen. HENT: No scleral pallor or icterus. Pupils equally reactive to light. Oral mucosa is moist NECK: is supple, no gross swelling noted. CHEST:  Diminished breath sounds bilaterally.  Mild wheezes. CVS: S1 and S2 heard, no murmur.  Irregularly irregular rhythm. ABDOMEN: Soft, non-tender, bowel sounds are present. EXTREMITIES: No edema. CNS: Cranial nerves are intact. No focal motor deficits. SKIN: warm and dry without rashes.  Data Review: I have personally reviewed the following laboratory data and studies,  CBC: Recent Labs  Lab 11/28/19 0810 11/30/2019 1403 12/09/2019 1844  WBC 4.5 7.4  --   NEUTROABS 3.6  --  6.7  HGB 6.3* 5.8*  --   HCT 18.2* 16.2*  --   MCV 95.3 95.9  --   PLT 311 313  --    Basic Metabolic Panel: Recent Labs  Lab 11/28/19 0810 11/22/2019 1403  NA 140 136  K 5.1 3.9  CL 106 103  CO2 21* 22  GLUCOSE 140* 140*  BUN 24* 29*  CREATININE 0.85 0.93  CALCIUM 7.8* 8.7*   Liver Function Tests: Recent Labs  Lab 11/28/19 0810 11/20/2019 1403  AST 22 15  ALT 23 25  ALKPHOS 44 44  BILITOT 4.0* 3.5*  PROT 5.7* 6.3*  ALBUMIN 3.4* 3.5   No results for input(s): LIPASE, AMYLASE in the last 168 hours. No results for input(s): AMMONIA in the last 168 hours. Cardiac Enzymes: No results for input(s): CKTOTAL, CKMB, CKMBINDEX, TROPONINI in the last 168 hours. BNP (last 3 results) Recent Labs    10/10/19 1154 11/05/19 1635  BNP 130.7* 104.9*    ProBNP (last 3 results) No results for input(s): PROBNP in the last 8760 hours.  CBG: No results for input(s): GLUCAP in the last 168 hours. No results found for this or any previous visit (from the past 240 hour(s)).   Studies: DG Chest 2 View  Result Date: 11/30/2019 CLINICAL DATA:  Shortness of breath EXAM: CHEST - 2 VIEW COMPARISON:  11/05/2019 FINDINGS: Cardiomegaly. Bibasilar atelectasis. No overt edema. Patchy  peripheral opacity again noted in the left lower lung, unchanged since prior study. IMPRESSION: Cardiomegaly, bibasilar atelectasis. Continued patchy peripheral opacity in the left lower lung could reflect pneumonia. Electronically Signed   By: Rolm Baptise M.D.   On: 11/30/2019 19:08   DG Chest Portable 1 View  Result Date: 12/03/2019 CLINICAL DATA:  Cough EXAM: PORTABLE CHEST 1 VIEW COMPARISON:  November 30, 2019 FINDINGS: The mediastinal contour and cardiac silhouette are normal. Patchy opacities of bilateral lung bases are noted. There is no pleural effusion. The bony structures are stable. IMPRESSION: Patchy opacities of bilateral lung bases, suspicious for pneumonia. Electronically Signed   By: Abelardo Diesel M.D.   On: 11/28/2019 13:57      Flora Lipps, MD  [facility] 12/02/2019

## 2019-12-03 ENCOUNTER — Other Ambulatory Visit: Payer: Medicare Other

## 2019-12-03 ENCOUNTER — Telehealth: Payer: Self-pay | Admitting: *Deleted

## 2019-12-03 DIAGNOSIS — D5912 Cold autoimmune hemolytic anemia: Secondary | ICD-10-CM

## 2019-12-03 DIAGNOSIS — J9621 Acute and chronic respiratory failure with hypoxia: Secondary | ICD-10-CM

## 2019-12-03 DIAGNOSIS — D461 Refractory anemia with ring sideroblasts: Principal | ICD-10-CM

## 2019-12-03 DIAGNOSIS — I4891 Unspecified atrial fibrillation: Secondary | ICD-10-CM

## 2019-12-03 DIAGNOSIS — D599 Acquired hemolytic anemia, unspecified: Secondary | ICD-10-CM

## 2019-12-03 DIAGNOSIS — D649 Anemia, unspecified: Secondary | ICD-10-CM

## 2019-12-03 DIAGNOSIS — J9601 Acute respiratory failure with hypoxia: Secondary | ICD-10-CM

## 2019-12-03 DIAGNOSIS — R509 Fever, unspecified: Secondary | ICD-10-CM

## 2019-12-03 LAB — CBC WITH DIFFERENTIAL/PLATELET
Abs Immature Granulocytes: 0.04 10*3/uL (ref 0.00–0.07)
Basophils Absolute: 0 10*3/uL (ref 0.0–0.1)
Basophils Relative: 0 %
Eosinophils Absolute: 0 10*3/uL (ref 0.0–0.5)
Eosinophils Relative: 0 %
HCT: 20.8 % — ABNORMAL LOW (ref 36.0–46.0)
Hemoglobin: 6.6 g/dL — CL (ref 12.0–15.0)
Immature Granulocytes: 1 %
Lymphocytes Relative: 16 %
Lymphs Abs: 0.7 10*3/uL (ref 0.7–4.0)
MCH: 29.6 pg (ref 26.0–34.0)
MCHC: 31.7 g/dL (ref 30.0–36.0)
MCV: 93.3 fL (ref 80.0–100.0)
Monocytes Absolute: 0.1 10*3/uL (ref 0.1–1.0)
Monocytes Relative: 3 %
Neutro Abs: 3.3 10*3/uL (ref 1.7–7.7)
Neutrophils Relative %: 80 %
Platelets: 209 10*3/uL (ref 150–400)
RBC: 2.23 MIL/uL — ABNORMAL LOW (ref 3.87–5.11)
RDW: 20.1 % — ABNORMAL HIGH (ref 11.5–15.5)
WBC: 4.2 10*3/uL (ref 4.0–10.5)
nRBC: 1.2 % — ABNORMAL HIGH (ref 0.0–0.2)

## 2019-12-03 LAB — COMPREHENSIVE METABOLIC PANEL
ALT: 14 U/L (ref 0–44)
AST: 7 U/L — ABNORMAL LOW (ref 15–41)
Albumin: 2.9 g/dL — ABNORMAL LOW (ref 3.5–5.0)
Alkaline Phosphatase: 31 U/L — ABNORMAL LOW (ref 38–126)
Anion gap: 8 (ref 5–15)
BUN: 17 mg/dL (ref 8–23)
CO2: 23 mmol/L (ref 22–32)
Calcium: 8 mg/dL — ABNORMAL LOW (ref 8.9–10.3)
Chloride: 108 mmol/L (ref 98–111)
Creatinine, Ser: 0.66 mg/dL (ref 0.44–1.00)
GFR calc Af Amer: >60 mL/min (ref >60)
GFR calc non Af Amer: >60 mL/min (ref >60)
Glucose, Bld: 166 mg/dL — ABNORMAL HIGH (ref 70–99)
Potassium: 4.4 mmol/L (ref 3.5–5.1)
Sodium: 139 mmol/L (ref 135–145)
Total Bilirubin: 1.7 mg/dL — ABNORMAL HIGH (ref 0.3–1.2)
Total Protein: 5.2 g/dL — ABNORMAL LOW (ref 6.5–8.1)

## 2019-12-03 LAB — URINALYSIS, ROUTINE W REFLEX MICROSCOPIC
Bilirubin Urine: NEGATIVE
Glucose, UA: 50 mg/dL — AB
Hgb urine dipstick: NEGATIVE
Ketones, ur: NEGATIVE mg/dL
Leukocytes,Ua: NEGATIVE
Nitrite: NEGATIVE
Protein, ur: NEGATIVE mg/dL
Specific Gravity, Urine: 1.015 (ref 1.005–1.030)
pH: 5 (ref 5.0–8.0)

## 2019-12-03 LAB — MAGNESIUM: Magnesium: 1.9 mg/dL (ref 1.7–2.4)

## 2019-12-03 LAB — HEMOGLOBIN AND HEMATOCRIT, BLOOD
HCT: 25.2 % — ABNORMAL LOW (ref 36.0–46.0)
Hemoglobin: 8.3 g/dL — ABNORMAL LOW (ref 12.0–15.0)

## 2019-12-03 LAB — PREPARE RBC (CROSSMATCH)

## 2019-12-03 LAB — DIRECT ANTIGLOBULIN TEST (NOT AT ARMC)
DAT, IgG: POSITIVE
DAT, complement: POSITIVE

## 2019-12-03 LAB — HAPTOGLOBIN: Haptoglobin: 98 mg/dL (ref 42–346)

## 2019-12-03 MED ORDER — RESOURCE INSTANT PROTEIN PO PWD PACKET
2.0000 | Freq: Three times a day (TID) | ORAL | Status: DC
  Administered 2019-12-03: 17:00:00 12 g via ORAL
  Filled 2019-12-03 (×6): qty 12

## 2019-12-03 MED ORDER — ADULT MULTIVITAMIN W/MINERALS CH
1.0000 | ORAL_TABLET | Freq: Every day | ORAL | Status: DC
  Administered 2019-12-03: 12:00:00 1 via ORAL
  Filled 2019-12-03: qty 1

## 2019-12-03 MED ORDER — IPRATROPIUM-ALBUTEROL 0.5-2.5 (3) MG/3ML IN SOLN
3.0000 mL | Freq: Four times a day (QID) | RESPIRATORY_TRACT | Status: DC
  Administered 2019-12-03: 3 mL via RESPIRATORY_TRACT
  Filled 2019-12-03: qty 3

## 2019-12-03 MED ORDER — SODIUM CHLORIDE 0.9% IV SOLUTION: Freq: Once | INTRAVENOUS | Status: DC

## 2019-12-03 MED ORDER — DILTIAZEM HCL 25 MG/5ML IV SOLN
10.0000 mg | Freq: Once | INTRAVENOUS | Status: AC
  Administered 2019-12-03: 10 mg via INTRAVENOUS
  Filled 2019-12-03: qty 5

## 2019-12-03 MED ORDER — AMIODARONE IV BOLUS ONLY 150 MG/100ML
150.0000 mg | Freq: Once | INTRAVENOUS | Status: AC
  Administered 2019-12-03: 150 mg via INTRAVENOUS
  Filled 2019-12-03: qty 100

## 2019-12-03 MED ORDER — AMIODARONE LOAD VIA INFUSION: 150.0000 mg | Freq: Once | INTRAVENOUS | Status: DC

## 2019-12-03 MED ORDER — ORAL CARE MOUTH RINSE
15.0000 mL | Freq: Two times a day (BID) | OROMUCOSAL | Status: DC
  Administered 2019-12-04: 09:00:00 15 mL via OROMUCOSAL

## 2019-12-03 MED ORDER — SALINE SPRAY 0.65 % NA SOLN
1.0000 | NASAL | Status: DC | PRN
  Administered 2019-12-03: 21:00:00 1 via NASAL
  Filled 2019-12-03: qty 44

## 2019-12-03 MED ORDER — DIPHENHYDRAMINE HCL 50 MG/ML IJ SOLN
25.0000 mg | Freq: Once | INTRAMUSCULAR | Status: AC
  Administered 2019-12-03: 15:00:00 25 mg via INTRAVENOUS
  Filled 2019-12-03: qty 1

## 2019-12-03 MED ORDER — ACETAMINOPHEN 325 MG PO TABS
325.0000 mg | ORAL_TABLET | Freq: Once | ORAL | Status: AC
  Administered 2019-12-03: 325 mg via ORAL
  Filled 2019-12-03: qty 1

## 2019-12-03 MED ORDER — IPRATROPIUM-ALBUTEROL 0.5-2.5 (3) MG/3ML IN SOLN
3.0000 mL | Freq: Three times a day (TID) | RESPIRATORY_TRACT | Status: DC
  Administered 2019-12-04: 3 mL via RESPIRATORY_TRACT
  Filled 2019-12-03: qty 3

## 2019-12-03 MED ORDER — BUDESONIDE 0.25 MG/2ML IN SUSP
0.2500 mg | Freq: Two times a day (BID) | RESPIRATORY_TRACT | Status: DC
  Administered 2019-12-03 – 2019-12-04 (×2): 0.25 mg via RESPIRATORY_TRACT
  Filled 2019-12-03 (×2): qty 2

## 2019-12-03 MED ORDER — BENEPROTEIN PO POWD
2.0000 | Freq: Three times a day (TID) | ORAL | Status: DC
  Filled 2019-12-03: qty 227

## 2019-12-03 MED ORDER — FUROSEMIDE 10 MG/ML IJ SOLN
40.0000 mg | Freq: Once | INTRAMUSCULAR | Status: AC
  Administered 2019-12-03: 12:00:00 40 mg via INTRAVENOUS
  Filled 2019-12-03: qty 4

## 2019-12-03 NOTE — Progress Notes (Addendum)
HEMATOLOGY-ONCOLOGY PROGRESS NOTE  SUBJECTIVE: Alexandra Pierce presented to the emergency room with cough and shortness of breath.  She states that she was having fevers and chills at home.  She is not sure how high her fevers were.  She had a temperature up to 103.1 early this morning.  Still having chills and cough.  No chest discomfort.  She has not noticed any bleeding.  She was recently seen by Dr. Randa Spike at [facility] for a second opinion regarding treatment of her MDS/MPN.  Labs performed at [facility] showed cold agglutinin 1: 320, haptoglobin was low at 3.  On admission, her hemoglobin was 5.8.  She received 2 units PRBCs on 12/02/1019.  Another unit has been ordered for today for a hemoglobin of 6.6.  REVIEW OF SYSTEMS:   Noncontributory except as noted in the HPI.  I have reviewed the past medical history, past surgical history, social history and family history with the patient and they are unchanged from previous note.   PHYSICAL EXAMINATION: ECOG PERFORMANCE STATUS: 1 - Symptomatic but completely ambulatory  Vitals:   12/03/19 1157 12/03/19 1200  BP:  137/66  Pulse: (!) 126 (!) 134  Resp: (!) 29 (!) 22  Temp: 98.6 F (37 C)   SpO2: 90% 90%   Filed Weights   11/26/2019 2011 12/02/19 0318 12/03/19 0420  Weight: 159 lb 13.3 oz (72.5 kg) 167 lb 8.8 oz (76 kg) 174 lb 6.1 oz (79.1 kg)    Intake/Output from previous day: 02/14 0701 - 02/15 0700 In: 775.6 [P.O.:320; I.V.:105.6; IV Piggyback:350] Out: 300 [Urine:300]  GENERAL:alert, no distress and comfortable SKIN: skin color, texture, turgor are normal, no rashes or significant lesions EYES: normal, Conjunctiva are pink and non-injected, sclera clear OROPHARYNX:no exudate, no erythema and lips, buccal mucosa, and tongue normal  NECK: supple, thyroid normal size, non-tender, without nodularity LYMPH:  no palpable lymphadenopathy in the cervical, axillary or inguinal LUNGS: clear to auscultation and percussion with normal breathing  effort HEART: Tachycardia, irregular rhythm ABDOMEN:abdomen soft, non-tender and normal bowel sounds Musculoskeletal:no cyanosis of digits and no clubbing  NEURO: alert & oriented x 3 with fluent speech, no focal motor/sensory deficits  LABORATORY DATA:  I have reviewed the data as listed CMP Latest Ref Rng & Units 12/03/2019 12/02/2019 12/03/2019  Glucose 70 - 99 mg/dL 166(H) 120(H) 140(H)  BUN 8 - 23 mg/dL 17 18 29(H)  Creatinine 0.44 - 1.00 mg/dL 0.66 0.75 0.93  Sodium 135 - 145 mmol/L 139 137 136  Potassium 3.5 - 5.1 mmol/L 4.4 3.8 3.9  Chloride 98 - 111 mmol/L 108 108 103  CO2 22 - 32 mmol/L 23 20(L) 22  Calcium 8.9 - 10.3 mg/dL 8.0(L) 7.8(L) 8.7(L)  Total Protein 6.5 - 8.1 g/dL 5.2(L) - 6.3(L)  Total Bilirubin 0.3 - 1.2 mg/dL 1.7(H) 2.0(H) 3.5(H)  Alkaline Phos 38 - 126 U/L 31(L) - 44  AST 15 - 41 U/L 7(L) - 15  ALT 0 - 44 U/L 14 - 25    Lab Results  Component Value Date   WBC 4.2 12/03/2019   HGB 6.6 (LL) 12/03/2019   HCT 20.8 (L) 12/03/2019   MCV 93.3 12/03/2019   PLT 209 12/03/2019   NEUTROABS 3.3 12/03/2019    DG Chest 2 View  Result Date: 11/30/2019 CLINICAL DATA:  Shortness of breath EXAM: CHEST - 2 VIEW COMPARISON:  11/05/2019 FINDINGS: Cardiomegaly. Bibasilar atelectasis. No overt edema. Patchy peripheral opacity again noted in the left lower lung, unchanged since prior study. IMPRESSION:  Cardiomegaly, bibasilar atelectasis. Continued patchy peripheral opacity in the left lower lung could reflect pneumonia. Electronically Signed   By: Rolm Baptise M.D.   On: 11/30/2019 19:08   CT Angio Chest PE W and/or Wo Contrast  Result Date: 11/05/2019 CLINICAL DATA:  Shortness of breath, sepsis EXAM: CT ANGIOGRAPHY CHEST WITH CONTRAST TECHNIQUE: Multidetector CT imaging of the chest was performed using the standard protocol during bolus administration of intravenous contrast. Multiplanar CT image reconstructions and MIPs were obtained to evaluate the vascular anatomy. CONTRAST:   133mL OMNIPAQUE IOHEXOL 350 MG/ML SOLN COMPARISON:  2019 FINDINGS: Cardiovascular: Satisfactory opacification of the pulmonary arteries to the proximal segmental level. There is suboptimal evaluation particularly at the lung bases due to motion artifact. No evidence of pulmonary embolism. Cardiomegaly. No pericardial effusion. Coronary artery calcification. Mild calcification along the thoracic aorta. Mediastinum/Nodes: No mediastinal, hilar, or axillary adenopathy. Lungs/Pleura: There are patchy peripheral opacities. No pleural effusion or pneumothorax. Upper Abdomen: No acute abnormality. Hepatic cysts and mild splenomegaly are again noted. Musculoskeletal: No acute abnormality. Review of the MIP images confirms the above findings. IMPRESSION: No evidence of acute pulmonary embolism. Patchy peripheral pulmonary opacities, which may reflect atelectasis or may reflect residual findings of COVID-19 pneumonia, noting recent hospitalization. Cardiomegaly. Electronically Signed   By: Macy Mis M.D.   On: 11/05/2019 19:49   DG Chest Portable 1 View  Result Date: 12/14/2019 CLINICAL DATA:  Cough EXAM: PORTABLE CHEST 1 VIEW COMPARISON:  November 30, 2019 FINDINGS: The mediastinal contour and cardiac silhouette are normal. Patchy opacities of bilateral lung bases are noted. There is no pleural effusion. The bony structures are stable. IMPRESSION: Patchy opacities of bilateral lung bases, suspicious for pneumonia. Electronically Signed   By: Abelardo Diesel M.D.   On: 12/16/2019 13:57   DG Chest Portable 1 View  Result Date: 11/05/2019 CLINICAL DATA:  Shortness of breath since yesterday, diagnosed with COVID 2 weeks ago, hypertension, paroxysmal atrial fibrillation EXAM: PORTABLE CHEST 1 VIEW COMPARISON:  Portable exam 1335 hours compared to 10/24/2019 FINDINGS: Upper normal heart size. Mediastinal contours and pulmonary vascularity normal. Minimal patchy infiltrate at the lateral lower lungs bilaterally question  pneumonia. Remaining lungs clear. Central peribronchial thickening. No pleural effusion or pneumothorax. IMPRESSION: Bronchitic changes with minimal patchy infiltrate in the periphery of the lower lungs question pneumonia. Electronically Signed   By: Lavonia Dana M.D.   On: 11/05/2019 14:31    ASSESSMENT AND PLAN: 74 y.o. female with  1. 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 No overt evidence of bleeding.  In the emergency room the patient had labs which showed hemoglobin of 6.5 with an MCV of 96.5.  Normal WBC count of 7.2k with elevated platelets of 685k which on repeat were down to 573k. Reticulocyte counts were relatively suppressed. LDH was elevated to 517 but haptoglobin was within normal limits at 45. Coombs negative Myeloma panel showed no M spike Ferritin level was elevated to 845 suggestive of acute phase reaction/inflammation. B12 and folate within normal limits.  05/24/18 BM Bx results which revealed erythroid and megakaryocytic proliferation and ring sideroblasts concerning for myeloproliferative/ myelodysplastic neoplasm   No excess blasts; Acute leukemia and lymphoma are not indicated   11/26/2019 -last performed at [facility] showed a haptoglobin of 3 and cold agglutinin 1: 320  2. Rt renal radiographic abnormality as noted in CT with some fluid collection. 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 No fevers chills to suggest acute pyelonephritis or  renal abscess. Urine analysis showed trace leuk esterase and 6-10 WBCs no significant hematuria. Broad differential diagnosis based on CT findings as per radiologist. Procalcitonin levels unimpressive  05/23/18 CT Chest Abdomen Pelvis was done which showed mild splenomegaly without a focal splenic lesion. Multiple hepatic cysts.  Abnormal hypoenhancement in the right kidney lower pole of undetermined etiology -broad differential based on radiology  input. right perirenal stranding is associated with a small amount of fluid in the adjacent right paracolic gutter, and a small fluid collection along the inferior margin of the liver which could be an exophytic cyst, small complex fluid collection, or conceivably a tumor nodule. No other significant other lymphadenopathy noted.   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 Atrial fibrillation with RVR -Ad[facility] pt to [facility] on 10/24/2019 for SOB and weakness due to Atrial Fibrillation with RVR and suspected COVID 19 infection likely from exposure to her daughter who was recently diagnosed. Patient daughter informed us about this after clinic visit  PLAN: -Discussed pt labwork today,  12/03/2019;  all values are WNL except for  RBC at 2.23, Hgb at 6.6, HCT at 20.8, RDW at 20.1, nRBC Rel at 1.2, Glucose at 166, Calcium at 8.0, Total Protein at 5.2, Total Bilirubin at 1.7, albumin 2.9 -12/10/2019 LDH is elevated at 529 -Discussed Hgb at 6.6 - pt will require a blood transfusion  -Total Bilirubin still at 1.7, indicating hemolysis -Pt is becoming iron overloaded due to multiple blood transfusions, 10/25/2019 Ferritin at 2727 - hemolysis can falsely inflate this number  -Will have to begin an iron chelating agents to address iron overload. -Given that she has cold agglutinin disease, recommend for all blood to go through a blood warmer.  She is to be kept as warm as possible.  If possible, please warm her IV fluids and any other IV medications. -We will consider treatment with bendamustine and Rituxan for resistant cold agglutinin hemolytic anemia as an outpatient once she recovers from her acute illness.   LOS: 2 days   Mikey Bussing, DNP, AGPCNP-BC, AOCNP 12/03/19   ADDENDUM  .Patient was  Personally and independently interviewed, examined and relevant elements of the history of present illness were reviewed in details and an assessment and plan was created. All elements of the patient's history of present illness , assessment and plan were discussed in details with Mikey Bussing, DNP. The above documentation reflects our combined findings assessment and plan.  Patient is a complicated and challenging patient with underlying JAK2 positive MPN/MDS with superadded autoimmune hemolytic anemia.  Cold agglutinins present.  Coombs test here has been positive for IgG and C3 but was positive for only C3 B at [facility].  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  Patient does have anti-E antibodies. Her autoimmune hemolytic anemia has been poorly responsive steroids and Rituxan.  Patient has been seen by Dr. Randa Spike at [facility] who recommends bendamustine Rituxan for refractory cold agglutinin disease. Her hematologic process has been complicated with her Covid infection and now with a secondary pneumonia. Plan -Antibiotic management and management of pneumonia/sepsis as per hospitalist team/critical care team. -Complete cold avoidance and keeping the patient's room warm, patient to always remain covered from head to toe. -All IV fluids and blood products need to be passed through a blood warmer  to avoid additional hemolysis. -Continue folic acid and [id number]. -Transfuse as needed to maintain hemoglobin more than 7-7.5. -All labs drawn need to be done in prewarmed tubes and processed at 37 degrees to avoid falsely lower hemoglobin readings. -We will consider bendamustine Rituxan upon resolution of pneumonia. -Reasonable to continue prednisone at 60 mg p.o. daily at this time. -If severe uncontrolled hemolysis that is completely refractory to transfusions occur might need to consider plasmapheresis but would hold off at this time. -Goals  of care discussions have been had multiple times with the patient and her daughters.  They are aware of the complexity of the hematologic situation.  Sullivan Lone MD MS

## 2019-12-03 NOTE — H&P (Signed)
NAME:  Alexandra Pierce, MRN:  [id number], DOB:  03-01-1946, LOS: 2 ADMISSION DATE:  11/27/2019, CONSULTATION DATE:  12/03/19 REFERRING MD:  Flora Lipps, MD CHIEF COMPLAINT: Dyspnea, hypoxemia  Brief History   74 year old female with recent COVID-19 infection admitted for symptomatic anemia. PCCM consulted for recommendations on acute hypoxemic respiratory failure, dyspnea  History of present illness   Alexandra Pierce is a 74 year old female with MDS, atrial fibrillation on Xarelto and recent COVID-19 pneumonia who presented with fever, cough and shortness of breath and found acute on chronic blood loss anemia. During hospital course she has been transfused blood, started on antibiotics for CAP and diuresed however continues to have persistent hypoxemia. For her COVID 19 infection diagnosed on 10/24/19, she was previously treated with dexamethasone and remdesivir and discharged home 2L O2 at home. PCCM consulted for further recommendations.   Patient reports dyspnea on minimal exertion. Between multiple hospitalizations (12/23-12/26, 1/6-1/11, 1/18-1/21) for transfusion dependent MDS and COVID 19 infection, she has not been active but only previously required 2-3L O2 via [place]. Currently on 6-7L O2 with SpO2 98%.  Past Medical History  Myelodysplastic syndrome with hemolytic anemia, hx CVA, paroxysmal atrial fibrillation, HTN, [facility]   2/13 Admitted  Consults:  [facility] PCCM  Procedures:    Significant Diagnostic Tests:  CXR 12/11/2019 personally reviewed and interpreted by me - Bilateral patchy opacities predominantly in the bases  CTA 11/05/19 personally reviewed and interpreted by me - No pulmonary emboli, bilateral peripheral patchy opacities increased in the lung bases, cardiomegaly  PFTs 04/21/18 - No evidence of obstruction on spirometry though flow-volume loops still suggestive of underlying obstruction. Gas exchange is also moderately impaired.  Micro Data:  [place]  12/03/19> [facility] 12/03/19>  Antimicrobials:  Ceftriaxone 2/13> Azithro 2/13>  Interim history/subjective:  As above  Objective   Blood pressure 107/66, pulse (!) 108, temperature 99.7 F (37.6 C), temperature source Oral, resp. rate 19, height 5\' 6"  (1.676 m), weight 79.1 kg, SpO2 100 %.        Intake/Output Summary (Last 24 hours) at 12/03/2019 1542 Last data filed at 12/03/2019 1410 Gross per 24 hour  Intake 1018.25 ml  Output 600 ml  Net 418.25 ml   Filed Weights   11/20/2019 2011 12/02/19 0318 12/03/19 0420  Weight: 72.5 kg 76 kg 79.1 kg   Physical Exam: General: Elderly-appearing, no acute distress HENT: [place], AT, OP clear, MMM Eyes: EOMI, no scleral icterus Respiratory: Diminished breath sounds bilaterally.  No crackles, wheezing or rales Cardiovascular: RRR, -M/R/G, no JVD GI: BS+, soft, nontender Extremities:-Edema,-tenderness Neuro: AAO x4, CNII-XII grossly intact Skin: Intact, no rashes or bruising Psych: Normal mood, normal affect  Resolved Hospital Problem list     Assessment & Plan:   Acute on chronic hypoxemic respiratory failure. Likely multifactorial in setting of symptomatic anemia, prior COVID-19 infection and deconditioning. Recently discharged on home oxygen and steroids. Primary team concerned for co-comitant bacterial pneumonia so also being treated with CAP coverage. Patient has non-contributory smoking history (5 pack-years, quit >50 years ago). --Agree with antibiotics --Management of anemia per primary team --Continue prednisone 60 mg daily --Start Pulmicort and Duoneb inhalers --Wean supplemental oxygen for SpO2 goal 88-92% --PT as tolerated --If hypoxemia remains persistent, we can consider CT Chest later this hospitalization  Acute blood loss anemia in setting of MDS. Recently admission for symptomatic anemia requiring IVIG. --Per primary team --Hem/Onc following  Atrial fibrillation with RVR Rate/rhythm control per Cardiology No  anticoagulation due to acute blood loss anemia  Best practice:  Diet: Per primary Pain/Anxiety/Delirium protocol (if indicated): -- VAP protocol (if indicated): -- DVT prophylaxis: Per primary GI prophylaxis: Per primary Glucose control: Per primary Mobility: Encourage PT Code Status: Full code Family Communication: Discussed plan with patient and daughter at bedside Disposition:   Labs   CBC: Recent Labs  Lab 11/28/19 0810 11/30/2019 1403 12/12/2019 1844 12/02/19 0838 12/03/19 0311  WBC 4.5 7.4  --  4.2 4.2  NEUTROABS 3.6  --  6.7  --  3.3  HGB 6.3* 5.8*  --  7.1* 6.6*  HCT 18.2* 16.2*  --  22.5* 20.8*  MCV 95.3 95.9  --  94.1 93.3  PLT 311 313  --  215 [id number]    Basic Metabolic Panel: Recent Labs  Lab 11/28/19 0810 12/03/2019 1403 12/02/19 0838 12/03/19 0311  NA 140 136 137 139  K 5.1 3.9 3.8 4.4  CL 106 103 108 108  CO2 21* 22 20* 23  GLUCOSE 140* 140* 120* 166*  BUN 24* 29* 18 17  CREATININE 0.85 0.93 0.75 0.66  CALCIUM 7.8* 8.7* 7.8* 8.0*  MG  --   --   --  1.9   GFR: Estimated Creatinine Clearance: 64.5 mL/min (by C-G formula based on SCr of 0.66 mg/dL). Recent Labs  Lab 11/28/19 0810 11/30/2019 1403 12/02/19 0838 12/03/19 0311  WBC 4.5 7.4 4.2 4.2    Liver Function Tests: Recent Labs  Lab 11/28/19 0810 12/13/2019 1403 12/02/19 0841 12/03/19 0311  AST 22 15  --  7*  ALT 23 25  --  14  ALKPHOS 44 44  --  31*  BILITOT 4.0* 3.5* 2.0* 1.7*  PROT 5.7* 6.3*  --  5.2*  ALBUMIN 3.4* 3.5  --  2.9*   No results for input(s): LIPASE, AMYLASE in the last 168 hours. No results for input(s): AMMONIA in the last 168 hours.  ABG    Component Value Date/Time   TCO2 26 01/11/2014 1102     Coagulation Profile: No results for input(s): INR, PROTIME in the last 168 hours.  Cardiac Enzymes: No results for input(s): CKTOTAL, CKMB, CKMBINDEX, TROPONINI in the last 168 hours.  HbA1C: Hgb A1c MFr Bld  Date/Time Value Ref Range Status  10/11/2019 09:57 PM 5.1  4.8 - 5.6 % Final    Comment:    (NOTE) Pre diabetes:          5.7%-6.4% Diabetes:              >6.4% Glycemic control for   <7.0% adults with diabetes   01/11/2014 10:42 AM 5.6 <5.7 % Final    Comment:    (NOTE)                                                                       According to the ADA Clinical Practice Recommendations for 2011, when HbA1c is used as a screening test:  >=6.5%   Diagnostic of Diabetes Mellitus           (if abnormal result is confirmed) 5.7-6.4%   Increased risk of developing Diabetes Mellitus References:Diagnosis and Classification of Diabetes Mellitus,Diabetes [id number] 1):S62-S69 and Standards of Medical Care in  Diabetes - 2011,Diabetes Care,2011,34 (Suppl 1):S11-S61.    CBG: No results for input(s): GLUCAP in the last 168 hours.  Review of Systems:   Review of Systems  Constitutional: Positive for malaise/fatigue. Negative for chills, diaphoresis, fever and weight loss.  HENT: Negative for congestion, ear pain and sore throat.   Respiratory: Positive for cough and shortness of breath. Negative for hemoptysis, sputum production and wheezing.   Cardiovascular: Positive for chest pain. Negative for palpitations and leg swelling.  Gastrointestinal: Negative for abdominal pain, heartburn and nausea.  Genitourinary: Negative for frequency.  Musculoskeletal: Negative for joint pain and myalgias.  Skin: Negative for itching and rash.  Neurological: Negative for dizziness, weakness and headaches.  Endo/Heme/Allergies: Does not bruise/bleed easily.  Psychiatric/Behavioral: Negative for depression. The patient is not nervous/anxious.      Past Medical History  She,  has a past medical history of Chronic lower back pain, Hypercholesteremia, Hypertension, Obesity, Paroxysmal atrial fibrillation ([place]), and Stroke ([place]) (2001 & 2015).   Surgical History    Past Surgical History:  Procedure Laterality Date  . CARDIAC  CATHETERIZATION  03/31/2000   EF 49%/LAD lg vessel which coursed to the apex & gave rise to 2 diagonal branches/ LAD noted to have 30% ostial lesion & tapers to a sm vessel toward the apex but no high grade lesion/1st diagonal med sized vessel & 2nd diagonal sm vessel with no significant disease/ lt ventriculogram reveals mild global hypokinesis w/ an estimated EF of 45-50%  . CARDIAC CATHETERIZATION N/A 03/17/2016   Procedure: Left Heart Cath and Coronary Angiography;  Surgeon: Wellington Hampshire, MD;  Location: [facility];  Service: Cardiovascular;  Laterality: N/A;  . LEFT HEART CATH AND CORONARY ANGIOGRAPHY N/A 03/31/2018   Procedure: LEFT HEART CATH AND CORONARY ANGIOGRAPHY;  Surgeon: Martinique, Peter M, MD;  Location: [facility] [place] [facility];  Service: Cardiovascular;  Laterality: N/A;     Social History   reports that she quit smoking about 41 years ago. Her smoking use included cigarettes. She has a 1.80 pack-year smoking history. She has never used smokeless tobacco. She reports current alcohol use of about 14.0 standard drinks of alcohol per week. She reports that she does not use drugs.   Family History   Her family history includes Asthma in her father; Diabetes in her father; Heart disease in her brother, father, and mother; Hyperlipidemia in her brother, brother, and sister. There is no history of Cancer.   Allergies Allergies  Allergen Reactions  . Sulfonamide Derivatives Anaphylaxis  . Lipitor [Atorvastatin] Other (See Comments)    Caused bladder infection  . Pravachol [Pravastatin Sodium] Other (See Comments)    Myalgias  . Statins Other (See Comments)    Myalgia     Home Medications  Prior to Admission medications   Medication Sig Start Date End Date Taking? Authorizing Provider  Ascorbic Acid (VITAMIN C PO) Take 1 tablet by mouth daily.   Yes [provider]  b complex vitamins capsule Take 1 capsule by mouth daily.    Yes [provider]    cholecalciferol (VITAMIN D) 1000 UNITS tablet Take 1,000 Units by mouth daily.   Yes [provider]  cyanocobalamin 1000 MCG tablet Take 1,000 mcg by mouth daily.    Yes [provider]  fenofibrate 160 MG tablet Take 1 tablet (160 mg total) by mouth daily. 02/16/19  Yes Burtis Junes, NP  fluticasone (FLONASE) 50 MCG/ACT nasal spray Place 1 spray into both nostrils daily.  Yes [provider]  folic acid (FOLVITE) 1 MG tablet Take 5 tablets (5 mg total) by mouth daily. Patient taking differently: Take 2 mg by mouth daily.  10/13/19  Yes Alma Friendly, MD  metoprolol tartrate (LOPRESSOR) 25 MG tablet Take 0.5 tablets (12.5 mg total) by mouth 2 (two) times daily. 11/08/19  Yes Swayze, Ava, DO  nitrofurantoin, macrocrystal-monohydrate, (MACROBID) 100 MG capsule Take 100 mg by mouth 2 (two) times daily. 11/27/19  Yes [provider]  predniSONE (DELTASONE) 20 MG tablet Take 3 tablets (60 mg total) by mouth daily with breakfast. 11/08/19  Yes Swayze, Ava, DO  XARELTO 20 MG TABS tablet TAKE 1 TABLET BY MOUTH ONCE DAILY Patient taking differently: Take 20 mg by mouth daily.  04/18/19  Yes Martinique, Peter M, MD     Care time: 36 min    Rodman Pickle, M.D. [facility] Pulmonary/Critical Care Medicine 12/03/2019 3:44 PM   Please see Amion for pager number to reach on-call Pulmonary and Critical Care Team.

## 2019-12-03 NOTE — Progress Notes (Signed)
CRITICAL VALUE ALERT  Critical Value:  Hemoglobin 6.6  Date & Time Notied:  12/03/2019  Provider Notified: Bodenheimer  Orders Received/Actions taken: yes

## 2019-12-03 NOTE — Progress Notes (Signed)
Initial Nutrition Assessment  DOCUMENTATION CODES:   Not applicable  INTERVENTION:  - will order 2 scoops Beneprotein TID, each scoop provides 25 kcal and 6 grams protein. - will liberalize diet from Heart Healthy to Regular (MD approved). - will order daily multivitamin with minerals.    NUTRITION DIAGNOSIS:   Increased nutrient needs related to acute illness, chronic illness as evidenced by estimated needs.  GOAL:   Patient will meet greater than or equal to 90% of their needs  MONITOR:   PO intake, Supplement acceptance, Labs, Weight trends  REASON FOR ASSESSMENT:   Malnutrition Screening Tool  ASSESSMENT:   74 year old female with a history afib, HTN, hyperlipidemia, CVA, MDS/MPN JAK 2 positive follows with Dr. Irene Limbo. She was admitted 1/18-1/21 for symptomatic anemia requiring IVIG and found to have COVID-19 infection at that time. She was discharged with 2LO2 via [place]. She presented to the ED on 2/13 with 4 day hx of worsening cough and SOB. Her Hbg in the ED was 5.8  Per flow sheet, she consumed 60% of breakfast yesterday (218 kcal, 7.5 grams protein). Able to talk with RN prior to entering patient's room. She reports that patient does not eat breakfast usually but that she does well with applesauce.   Patient becomes very SOB and begins to cough when talking in more than a few word sentences. Patient states that she has been feeling very poorly since having COVID last month. Patient confirms poor appetite. She is open to trying unflavored protein powder (beneprotein) in her applesauce.  Able to talk with RN after leaving patient's room and she reports that she had patient during last admission and that once patient receives blood and begins to feel better, patient eats better.  Per chart review, weight today is 174 lb, weight yesterday was 167 lb, and weight on 2/13 was 160 lb. Weight on 11/06/19 was 163 lb. This indicates 3 lb weight loss (1.85 body weight) in 1 month time  frame; not significant for time frame.     Labs reviewed; Ca: 8 mg/dl. Medications reviewed; 5 mg folvite/day, 60 mg deltasone/day.     NUTRITION - FOCUSED PHYSICAL EXAM:  unable to complete at this time per patient preference.   Diet Order:   Diet Order            Diet regular Room service appropriate? Yes; Fluid consistency: Thin  Diet effective now              EDUCATION NEEDS:   No education needs have been identified at this time  Skin:  Skin Assessment: Reviewed RN Assessment  Last BM:  2/12  Height:   Ht Readings from Last 1 Encounters:  12/08/2019 5\' 6"  (1.676 m)    Weight:   Wt Readings from Last 1 Encounters:  12/03/19 79.1 kg    Ideal Body Weight:  59.1 kg  BMI:  Body mass index is 28.15 kg/m.  Estimated Nutritional Needs:   Kcal:  [id number] kcal  Protein:  115-125 grams  Fluid:  >/= 1.8 L/day     Jarome Matin, MS, RD, LDN, CNSC Inpatient Clinical Dietitian RD pager # available in AMION  After hours/weekend pager # available in [facility]

## 2019-12-03 NOTE — Progress Notes (Signed)
Patient febrile, tachy in the 160's.  Hemoglobin 6.6.  Paged Bodenheimer x2.  Increased cardizem to 10, [place] increased to 6L [place].  Patient currently has one IV despite attempts x2 to place another. IV team consulted.

## 2019-12-03 NOTE — Progress Notes (Addendum)
PROGRESS NOTE  Alexandra Pierce [id number] DOB: [birth year] DOA: 12/06/2019 PCP: Chesley Noon, MD   LOS: 2 days   Brief narrative: As per HPI,  Patient is a 74 year old female with a history paroxysmal atrial fibrillation, hypertension, hyperlipidemia, CVA, MDS/MPN JAK 2 positive follows with Dr. Irene Limbo recent admission from 1/18-1/21 for symptomatic anemia requiring IVIG and found to have COVID 19 infection at that time and on prednisone 60 mg daily, discharged with 2L/min O2 via [place] at that time who presented to the hospital with worsening cough and shortness of breath x4 days, found to have Hb 5.8 at presentation.  Assessment/Plan:  Active Problems:   Symptomatic anemia  Shortness of breath, acute hypoxic respiratory failure on 5 L of oxygen saturating 91%.  Worsening hypoxia today.  Will give 1 dose of IV Lasix.  Patient is positive balance for 233ml.  Shortness of breath and hypoxic respiratory failure likely from acutely worsened JAK2 positive MDS/MPN and hemolytic anemia, post Covid lung, community-acquired pneumonia, atrial fibrillation.  Patient received 2 units of packed RBC but her hemoglobin has improved to 6.6 from 5.8.  Will transfuse 1 more unit of blood today.  Likely ongoing hemodialysis.    Hold Xarelto for now.  Check FOBT.  Currently on 2 L of nasal cannula and is at baseline.  No mention of bleeding reported.  Atrial fibrillation with RVR: Dyspneic with cough.  No chest pain.  Still on Cardizem drip.  In the context of hypoxia shortness of breath will get cardiology consultation.  Hold Xarelto for now.  Fever, cough with history of COVID-19: Patient was COVID-19 + on 10/24/2019.  Chest x-ray shows patchy opacities over the bilateral lung bases.  Patient is afebrile without any leukocytosis but is immunosuppressed on chronic prednisone.  Patient has been started on empiric antibiotics for community-acquired pneumonia.  Continue Rocephin and Zithromax.  Still complains of a  cough even on talking.  T-max of 103.1 F.  Continue chest physical therapy.  Will do blood cultures today.  Check urinalysis as well.  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  Direct antiglobulin test is positive.  Haptoglobin is pending.  Total bilirubin is elevated including LDH and reticulocyte's...  Spoke with hematology at bedside.  Will follow recommendation.  Continue prednisone.  Patient was treated with rituximab in the past and is currently maintained on prednisone as outpatient.  Hyperlipidemia: Continue fenofibrate  Essential hypertension: Currently on Cardizem drip for atrial fibrillation.  Patient is on Lopressor at home.    History of CAD.  No acute chest pain.  Aspirin on hold.  Continue fenofibrate.  VTE Prophylaxis: Hold Xarelto for now.  SCD  Code Status: Full code  Family Communication: I spoke with the patient's daughter on the phone and updated her about the clinical condition.   Disposition Plan:  . Patient is from home  . Likely disposition undetermined at this time, . Barriers to discharge: pending clinical improvement, blood transfusion, monitoring of CBC, IV antibiotic for community-acquired pneumonia, PT evaluation if tolerated.  Consult cardiology.  Addendum:  12/03/2019 5:02 PM  Due to significant dyspnea with increasing oxygen demand, history of recent Covid we will also get pulmonary opinion.  Spoke with pulmonary physician for consultation.  Consultants:  Hematooncology  Cardiology  Procedures:  PRBC  transfusion 3 units  Antibiotics:  . Rocephin and Zithromax 12/13/2019>  Anti-infectives (From admission, onward)   Start     Dose/Rate Route Frequency Ordered Stop   12/03/2019 1800  cefTRIAXone (ROCEPHIN) 1 g in sodium chloride 0.9 % 100 mL IVPB     1 g 200 mL/hr over 30 Minutes Intravenous Every 24 hours 11/30/2019 1646     12/03/2019 1800  azithromycin (ZITHROMAX) 500 mg in sodium chloride 0.9 %  250 mL IVPB     500 mg 250 mL/hr over 60 Minutes Intravenous Every 24 hours 12/14/2019 1646       Subjective:  Today, patient was seen and examined at bedside.  Denies interval complaints but has cough even on talking.  On higher flow of oxygen.  Denies productive sputum.  Denies any chest pain, dizziness, lightheadedness.  No nausea vomiting no pain.  Objective: Vitals:   12/03/19 1110 12/03/19 1115  BP: (!) 97/55 114/65  Pulse: (!) 127 (!) 128  Resp: (!) 30 (!) 29  Temp: (!) 100.5 F (38.1 C)   SpO2: 91% 91%    Intake/Output Summary (Last 24 hours) at 12/03/2019 1139 Last data filed at 12/03/2019 0954 Gross per 24 hour  Intake 704.27 ml  Output 300 ml  Net 404.27 ml   Filed Weights   12/03/2019 2011 12/02/19 0318 12/03/19 0420  Weight: 72.5 kg 76 kg 79.1 kg   Body mass index is 28.15 kg/m.   Physical Exam: GENERAL: Patient is alert awake and oriented. Not in obvious distress.  On 5 L of nasal cannula oxygen. HENT: No scleral pallor or icterus. Pupils equally reactive to light. Oral mucosa is moist NECK: is supple, no gross swelling noted. CHEST:  Diminished breath sounds bilaterally.  Coarse breath sounds noted CVS: S1 and S2 heard, no murmur.  Irregularly irregular rhythm. ABDOMEN: Soft, non-tender, bowel sounds are present. EXTREMITIES: No edema. CNS: Cranial nerves are intact. No focal motor deficits. SKIN: warm and dry without rashes.  Data Review: I have personally reviewed the following laboratory data and studies,  CBC: Recent Labs  Lab 11/28/19 0810 12/03/2019 1403 12/02/2019 1844 12/02/19 0838 12/03/19 0311  WBC 4.5 7.4  --  4.2 4.2  NEUTROABS 3.6  --  6.7  --  3.3  HGB 6.3* 5.8*  --  7.1* 6.6*  HCT 18.2* 16.2*  --  22.5* 20.8*  MCV 95.3 95.9  --  94.1 93.3  PLT 311 313  --  215 [id number]   Basic Metabolic Panel: Recent Labs  Lab 11/28/19 0810 12/14/2019 1403 12/02/19 0838 12/03/19 0311  NA 140 136 137 139  K 5.1 3.9 3.8 4.4  CL 106 103 108 108  CO2  21* 22 20* 23  GLUCOSE 140* 140* 120* 166*  BUN 24* 29* 18 17  CREATININE 0.85 0.93 0.75 0.66  CALCIUM 7.8* 8.7* 7.8* 8.0*  MG  --   --   --  1.9   Liver Function Tests: Recent Labs  Lab 11/28/19 0810 11/25/2019 1403 12/02/19 0841 12/03/19 0311  AST 22 15  --  7*  ALT 23 25  --  14  ALKPHOS 44 44  --  31*  BILITOT 4.0* 3.5* 2.0* 1.7*  PROT 5.7* 6.3*  --  5.2*  ALBUMIN 3.4* 3.5  --  2.9*   No results for input(s): LIPASE, AMYLASE in the last 168 hours. No results for input(s): AMMONIA in the last 168 hours. Cardiac Enzymes: No results for input(s): CKTOTAL, CKMB, CKMBINDEX, TROPONINI in the last 168 hours. BNP (last 3 results) Recent Labs    10/10/19 1154 11/05/19 1635  BNP 130.7* 104.9*    ProBNP (last 3 results) No results for input(s): PROBNP in  the last 8760 hours.  CBG: No results for input(s): GLUCAP in the last 168 hours. No results found for this or any previous visit (from the past 240 hour(s)).   Studies: DG Chest Portable 1 View  Result Date: 12/06/2019 CLINICAL DATA:  Cough EXAM: PORTABLE CHEST 1 VIEW COMPARISON:  November 30, 2019 FINDINGS: The mediastinal contour and cardiac silhouette are normal. Patchy opacities of bilateral lung bases are noted. There is no pleural effusion. The bony structures are stable. IMPRESSION: Patchy opacities of bilateral lung bases, suspicious for pneumonia. Electronically Signed   By: Abelardo Diesel M.D.   On: 12/03/2019 13:57      Flora Lipps, MD  [facility] 12/03/2019

## 2019-12-03 NOTE — Progress Notes (Signed)
Additional IV obtained by IV team.  Orders received from NP Bodenheimer, lab aware and ordering blood.

## 2019-12-03 NOTE — Telephone Encounter (Signed)
Received Telephone Advice fax from after hours [facility]  - patient daughter called 11/27/2019 @ 0955. Mother was short of breath and coughing - went to ED for evaluation, had CXR and came home. No medications given in ED. Mother continues to be short of breath with persistent cough. On Call MD notified by [facility]. Per On Call MD - patient should return to  ED if she continues to experience shortness of breath. Information given to daughter by [employer].

## 2019-12-03 NOTE — Progress Notes (Signed)
Called blood bank to check on status of pt's blood. Still not arrived at this time but will receive call when arrives and cross matched

## 2019-12-03 NOTE — Progress Notes (Signed)
Cardiology Consultation:   Patient ID: Alexandra Pierce MRN: [id number]; DOB: May 21, 1946  Admit date: 12/02/2019 Date of Consult: 12/03/2019  Primary Care Provider: Chesley Noon, MD Primary Cardiologist: Truitt Merle, NP  , Thompson Grayer, MD  Primary Electrophysiologist:  None     Patient Profile:   Alexandra Pierce is a 74 y.o. female with a hx of paroxysmal atrial fibrillation, hypertension, hyperlipidemia, CVA, anemia who is being seen today for the evaluation of atrial fibrillation with rapid ventricular response at the request of DrPokhrel.  [name] Kitchen  History of Present Illness:   Alexandra Pierce is a 74 year old female with a history of paroxysmal atrial fibrillation, hypertension, hyperlipidemia, CVA.  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  She was found to be COVID-19 positive at that time.  She was admitted with severe weakness, fatigue and shortness of breath and was found to have hemoglobin of 5.8.  She had just gotten up to use the bedside commode when I arrived.  She is back in bed but her heart rate was 170.  Her O2 sats were in the high 70s.  She was in severe respiratory distress and was waving her arms frantically.  The nurse and to calm her down and have her take deep breaths.  Her O2 sats gradually increased and her heart rate gradually fell.  She did not have any chest pain.  She just has severe shortness of breath. She is quite anemic but is not passing blood.  She has intense hemolysis from her myelodysplastic syndrome.  She is on prednisone.    Heart Pathway Score:     Past Medical History:  Diagnosis Date  . Chronic lower back pain   . Hypercholesteremia   . Hypertension    mild  . Obesity   . Paroxysmal atrial fibrillation (HCC)    managed with anticoagulation and rate control.   . Stroke [facility]) 2001 & 2015   "right side gets weak sometimes if I'm only tired" (03/16/2016)     Past Surgical History:  Procedure Laterality Date  . CARDIAC CATHETERIZATION  03/31/2000   EF 49%/LAD lg vessel which coursed to the apex & gave rise to 2 diagonal branches/ LAD noted to have 30% ostial lesion & tapers to a sm vessel toward the apex but no high grade lesion/1st diagonal med sized vessel & 2nd diagonal sm vessel with no significant disease/ lt ventriculogram reveals mild global hypokinesis w/ an estimated EF of 45-50%  . CARDIAC CATHETERIZATION N/A 03/17/2016   Procedure: Left Heart Cath and Coronary Angiography;  Surgeon: Wellington Hampshire, MD;  Location: [place] [facility];  Service: Cardiovascular;  Laterality: N/A;  . LEFT HEART CATH AND CORONARY ANGIOGRAPHY N/A 03/31/2018   Procedure: LEFT HEART CATH AND CORONARY ANGIOGRAPHY;  Surgeon: Martinique, Peter M, MD;  Location: [place] [facility];  Service: Cardiovascular;  Laterality: N/A;     Home Medications:  Prior to Admission medications   Medication Sig Start Date End Date Taking? Authorizing Provider  Ascorbic Acid (VITAMIN C PO) Take 1 tablet by mouth daily.   Yes [provider]  b complex vitamins capsule Take 1 capsule by mouth daily.    Yes [provider]  cholecalciferol (VITAMIN D) 1000 UNITS tablet Take 1,000 Units by mouth daily.   Yes [provider]  cyanocobalamin 1000 MCG tablet Take 1,000 mcg by mouth daily.    Yes [provider]  fenofibrate 160  MG tablet Take 1 tablet (160 mg total) by mouth daily. 02/16/19  Yes Burtis Junes, NP  fluticasone (FLONASE) 50 MCG/ACT nasal spray Place 1 spray into both nostrils daily.   Yes [provider]  folic acid (FOLVITE) 1 MG tablet Take 5 tablets (5 mg total) by mouth daily. Patient taking differently: Take 2 mg by mouth daily.  10/13/19  Yes Alma Friendly, MD  metoprolol tartrate (LOPRESSOR) 25 MG tablet Take 0.5 tablets (12.5 mg total) by mouth 2 (two) times daily. 11/08/19  Yes Swayze, Ava, DO  nitrofurantoin,  macrocrystal-monohydrate, (MACROBID) 100 MG capsule Take 100 mg by mouth 2 (two) times daily. 11/27/19  Yes [provider]  predniSONE (DELTASONE) 20 MG tablet Take 3 tablets (60 mg total) by mouth daily with breakfast. 11/08/19  Yes Swayze, Ava, DO  XARELTO 20 MG TABS tablet TAKE 1 TABLET BY MOUTH ONCE DAILY Patient taking differently: Take 20 mg by mouth daily.  04/18/19  Yes Martinique, Peter M, MD    Inpatient Medications: Scheduled Meds: . sodium chloride   Intravenous Once  . acetaminophen  325 mg Oral Once  . Chlorhexidine Gluconate Cloth  6 each Topical Daily  . diltiazem  10 mg Intravenous Once  . diphenhydrAMINE  25 mg Intravenous Once  . fenofibrate  160 mg Oral Daily  . folic acid  5 mg Oral Daily  . guaiFENesin  600 mg Oral BID  . multivitamin with minerals  1 tablet Oral Daily  . predniSONE  60 mg Oral Q breakfast  . protein supplement  2 Scoop Oral TID WC   Continuous Infusions: . azithromycin Stopped (12/02/19 1821)  . cefTRIAXone (ROCEPHIN)  IV Stopped (12/02/19 1615)  . diltiazem (CARDIZEM) infusion 15 mg/hr (12/03/19 1158)   PRN Meds: acetaminophen **OR** acetaminophen, benzonatate, polyethylene glycol  Allergies:    Allergies  Allergen Reactions  . Sulfonamide Derivatives Anaphylaxis  . Lipitor [Atorvastatin] Other (See Comments)    Caused bladder infection  . Pravachol [Pravastatin Sodium] Other (See Comments)    Myalgias  . Statins Other (See Comments)    Myalgia    Social History:   Social History   Socioeconomic History  . Marital status: Married    Spouse name: Not on file  . Number of children: 2  . Years of education: Not on file  . Highest education level: Not on file  Occupational History  . Occupation: Herbalist  Tobacco Use  . Smoking status: Former Smoker    Packs/day: 0.12    Years: 15.00    Pack years: 1.80    Types: Cigarettes    Quit date: 10/18/1978    Years since quitting: 41.1  . Smokeless tobacco: Never Used    Substance and Sexual Activity  . Alcohol use: Yes    Alcohol/week: 14.0 standard drinks    Types: 14 Glasses of wine per week  . Drug use: No  . Sexual activity: Yes  Other Topics Concern  . Not on file  Social History Narrative  . Not on file   Social Determinants of Health   Financial Resource Strain:   . Difficulty of Paying Living Expenses: Not on file  Food Insecurity:   . Worried About Charity fundraiser in the Last Year: Not on file  . Ran Out of Food in the Last Year: Not on file  Transportation Needs:   . Lack of Transportation (Medical): Not on file  . Lack of Transportation (Non-Medical): Not on file  Physical  Activity:   . Days of Exercise per Week: Not on file  . Minutes of Exercise per Session: Not on file  Stress:   . Feeling of Stress : Not on file  Social Connections:   . Frequency of Communication with Friends and Family: Not on file  . Frequency of Social Gatherings with Friends and Family: Not on file  . Attends Religious Services: Not on file  . Active Member of Clubs or Organizations: Not on file  . Attends Archivist Meetings: Not on file  . Marital Status: Not on file  Intimate Partner Violence:   . Fear of Current or Ex-Partner: Not on file  . Emotionally Abused: Not on file  . Physically Abused: Not on file  . Sexually Abused: Not on file    Family History:    Family History  Problem Relation Age of Onset  . Heart disease Father   . Asthma Father   . Diabetes Father   . Heart disease Mother   . Heart disease Brother   . Hyperlipidemia Sister   . Hyperlipidemia Brother   . Hyperlipidemia Brother   . Cancer Neg Hx      ROS:  Please see the history of present illness.   All other ROS reviewed and negative.     Physical Exam/Data:   Vitals:   12/03/19 1130 12/03/19 1145 12/03/19 1157 12/03/19 1200  BP: 126/62 139/70  137/66  Pulse: (!) 130 (!) 142 (!) 126 (!) 134  Resp: (!) 26 (!) 37 (!) 29 (!) 22  Temp:    (!) 101.1  F (38.4 C)  TempSrc:    Oral  SpO2: 93% (!) 86% 90% 90%  Weight:      Height:        Intake/Output Summary (Last 24 hours) at 12/03/2019 1230 Last data filed at 12/03/2019 1158 Gross per 24 hour  Intake 827.05 ml  Output 300 ml  Net 527.05 ml   Last 3 Weights 12/03/2019 12/02/2019 12/14/2019  Weight (lbs) 174 lb 6.1 oz 167 lb 8.8 oz 159 lb 13.3 oz  Weight (kg) 79.1 kg 76 kg 72.5 kg     Body mass index is 28.15 kg/m.  General:  Elderly female in severe respiratory distress when I initially arrived ( was immediately after using the bedside commode)  HEENT: normal Lymph: no adenopathy Neck: no JVD Endocrine:  No thryomegaly Vascular: No carotid bruits; FA pulses 2+ bilaterally without bruits  Cardiac:  Irreg irreg.  tachy Lungs:  Coarse rales bilat R >> L   Abd: soft, nontender, no hepatomegaly  Ext: no edema Musculoskeletal:  No deformities, BUE and BLE strength normal and equal Skin: warm and dry  Neuro:  CNs 2-12 intact, no focal abnormalities noted Psych:  Normal affect   EKG:  The EKG was personally reviewed and demonstrates:   Telemetry:  Telemetry was personally reviewed and demonstrates:  Rapid atrial fib   Relevant CV Studies:   Laboratory Data:  High Sensitivity Troponin:   Recent Labs  Lab 11/05/19 1635 11/05/19 1837  TROPONINIHS 5 7     Chemistry Recent Labs  Lab 12/13/2019 1403 12/02/19 0838 12/03/19 0311  NA 136 137 139  K 3.9 3.8 4.4  CL 103 108 108  CO2 22 20* 23  GLUCOSE 140* 120* 166*  BUN 29* 18 17  CREATININE 0.93 0.75 0.66  CALCIUM 8.7* 7.8* 8.0*  GFRNONAA >60 >60 >60  GFRAA >60 >60 >60  ANIONGAP 11 9 8  Recent Labs  Lab 11/28/19 0810 12/03/2019 1403 12/02/19 0841 12/03/19 0311  PROT 5.7* 6.3*  --  5.2*  ALBUMIN 3.4* 3.5  --  2.9*  AST 22 15  --  7*  ALT 23 25  --  14  ALKPHOS 44 44  --  31*  BILITOT 4.0* 3.5* 2.0* 1.7*   Hematology Recent Labs  Lab 11/28/19 0810 11/19/2019 1403 11/28/2019 1844 12/02/19 0838  12/03/19 0311  WBC  --  7.4  --  4.2 4.2  RBC   < > 1.69* 1.98* 2.39* 2.23*  HGB  --  5.8*  --  7.1* 6.6*  HCT  --  16.2*  --  22.5* 20.8*  MCV  --  95.9  --  94.1 93.3  MCH  --  34.3*  --  29.7 29.6  MCHC  --  35.8  --  31.6 31.7  RDW  --  21.6*  --  19.2* 20.1*  PLT  --  313  --  215 209   < > = values in this interval not displayed.   BNPNo results for input(s): BNP, PROBNP in the last 168 hours.  DDimer No results for input(s): DDIMER in the last 168 hours.   Radiology/Studies:  DG Chest 2 View  Result Date: 11/30/2019 CLINICAL DATA:  Shortness of breath EXAM: CHEST - 2 VIEW COMPARISON:  11/05/2019 FINDINGS: Cardiomegaly. Bibasilar atelectasis. No overt edema. Patchy peripheral opacity again noted in the left lower lung, unchanged since prior study. IMPRESSION: Cardiomegaly, bibasilar atelectasis. Continued patchy peripheral opacity in the left lower lung could reflect pneumonia. Electronically Signed   By: Rolm Baptise M.D.   On: 11/30/2019 19:08   DG Chest Portable 1 View  Result Date: 11/22/2019 CLINICAL DATA:  Cough EXAM: PORTABLE CHEST 1 VIEW COMPARISON:  November 30, 2019 FINDINGS: The mediastinal contour and cardiac silhouette are normal. Patchy opacities of bilateral lung bases are noted. There is no pleural effusion. The bony structures are stable. IMPRESSION: Patchy opacities of bilateral lung bases, suspicious for pneumonia. Electronically Signed   By: Abelardo Diesel M.D.   On: 12/10/2019 13:57    Assessment and Plan:   1.   Rapid atrial fib: With very minimal exertion, the patient had atrial fibrillation with a very fast ventricular response up into the 170s.  She decompensated almost to the point of needing intubation with this.  She is on max dose diltiazem.  We will start her on IV amiodarone to help control her rate.  We will start with IV and possibly transition her to p.o. amiodarone over the next several days.  My intention is not to necessarily convert her to sinus  rhythm but to control the rate.   Rate control is especially difficult since she is just getting over COVID-19 infection and she has significant lung damage from that.  In addition, she is severely anemic with a hemoglobin of 6.6.   She is not currently on anticoagulation because of her severe anemia.  She is not having GI bleeding but has intense hemolysis related to her myelodysplastic syndrome.  Consider digoxin for better rate control   2.  Profound anemia: Currently has myelodysplastic syndrome that is leading has led to intense hemolysis.  They are trying to match her with blood currently.  3.  Recent Covid infection: She has continued scarring in her lungs.  Further recommendations per internal medicine.      For questions or updates, please contact [place] Please consult [email or web address] for contact  info under     Signed, Mertie Moores, MD  12/03/2019 12:30 PM

## 2019-12-04 ENCOUNTER — Other Ambulatory Visit: Payer: Self-pay | Admitting: Cardiology

## 2019-12-04 ENCOUNTER — Inpatient Hospital Stay (HOSPITAL_COMMUNITY): Payer: Medicare Other

## 2019-12-04 DIAGNOSIS — I482 Chronic atrial fibrillation, unspecified: Secondary | ICD-10-CM

## 2019-12-04 DIAGNOSIS — I259 Chronic ischemic heart disease, unspecified: Secondary | ICD-10-CM

## 2019-12-04 DIAGNOSIS — E7849 Other hyperlipidemia: Secondary | ICD-10-CM

## 2019-12-04 DIAGNOSIS — R0602 Shortness of breath: Secondary | ICD-10-CM

## 2019-12-04 DIAGNOSIS — D5912 Cold autoimmune hemolytic anemia: Secondary | ICD-10-CM

## 2019-12-04 DIAGNOSIS — Z7901 Long term (current) use of anticoagulants: Secondary | ICD-10-CM

## 2019-12-04 LAB — COMPREHENSIVE METABOLIC PANEL
ALT: 17 U/L (ref 0–44)
AST: 9 U/L — ABNORMAL LOW (ref 15–41)
Albumin: 2.7 g/dL — ABNORMAL LOW (ref 3.5–5.0)
Alkaline Phosphatase: 34 U/L — ABNORMAL LOW (ref 38–126)
Anion gap: 9 (ref 5–15)
BUN: 17 mg/dL (ref 8–23)
CO2: 25 mmol/L (ref 22–32)
Calcium: 7.9 mg/dL — ABNORMAL LOW (ref 8.9–10.3)
Chloride: 103 mmol/L (ref 98–111)
Creatinine, Ser: 0.73 mg/dL (ref 0.44–1.00)
GFR calc Af Amer: >60 mL/min (ref >60)
GFR calc non Af Amer: >60 mL/min (ref >60)
Glucose, Bld: 204 mg/dL — ABNORMAL HIGH (ref 70–99)
Potassium: 3.6 mmol/L (ref 3.5–5.1)
Sodium: 137 mmol/L (ref 135–145)
Total Bilirubin: 1.7 mg/dL — ABNORMAL HIGH (ref 0.3–1.2)
Total Protein: 5.5 g/dL — ABNORMAL LOW (ref 6.5–8.1)

## 2019-12-04 LAB — BLOOD GAS, ARTERIAL
Acid-base deficit: 14.8 mmol/L — ABNORMAL HIGH (ref 0.0–2.0)
Bicarbonate: 14.3 mmol/L — ABNORMAL LOW (ref 20.0–28.0)
Drawn by: 33147
FIO2: 100
O2 Saturation: 55.5 %
Patient temperature: 101.7
pCO2 arterial: 54.1 mmHg — ABNORMAL HIGH (ref 32.0–48.0)
pH, Arterial: 7.064 — CL (ref 7.350–7.450)
pO2, Arterial: 47.9 mmHg — ABNORMAL LOW (ref 83.0–108.0)

## 2019-12-04 LAB — CBC
HCT: 23.9 % — ABNORMAL LOW (ref 36.0–46.0)
Hemoglobin: 7.7 g/dL — ABNORMAL LOW (ref 12.0–15.0)
MCH: 29.5 pg (ref 26.0–34.0)
MCHC: 32.2 g/dL (ref 30.0–36.0)
MCV: 91.6 fL (ref 80.0–100.0)
Platelets: 184 10*3/uL (ref 150–400)
RBC: 2.61 MIL/uL — ABNORMAL LOW (ref 3.87–5.11)
RDW: 19 % — ABNORMAL HIGH (ref 11.5–15.5)
WBC: 3.9 10*3/uL — ABNORMAL LOW (ref 4.0–10.5)
nRBC: 0.8 % — ABNORMAL HIGH (ref 0.0–0.2)

## 2019-12-04 LAB — PROCALCITONIN: Procalcitonin: 1.22 ng/mL

## 2019-12-04 LAB — URINE CULTURE: Culture: NO GROWTH

## 2019-12-04 LAB — MAGNESIUM: Magnesium: 1.9 mg/dL (ref 1.7–2.4)

## 2019-12-04 LAB — PHOSPHORUS: Phosphorus: 2.4 mg/dL — ABNORMAL LOW (ref 2.5–4.6)

## 2019-12-04 MED ORDER — ACETAMINOPHEN 10 MG/ML IV SOLN
1000.0000 mg | Freq: Four times a day (QID) | INTRAVENOUS | Status: DC
  Administered 2019-12-04: 1000 mg via INTRAVENOUS
  Filled 2019-12-04: qty 100

## 2019-12-04 MED ORDER — FUROSEMIDE 10 MG/ML IJ SOLN
40.0000 mg | Freq: Once | INTRAMUSCULAR | Status: AC
  Administered 2019-12-04: 40 mg via INTRAVENOUS
  Filled 2019-12-04: qty 4

## 2019-12-04 MED ORDER — MIDAZOLAM HCL 2 MG/2ML IJ SOLN
INTRAMUSCULAR | Status: AC
  Filled 2019-12-04: qty 2

## 2019-12-04 MED ORDER — SODIUM CHLORIDE 0.9 % IV SOLN
2.0000 g | Freq: Three times a day (TID) | INTRAVENOUS | Status: DC
  Administered 2019-12-04: 10:00:00 2 g via INTRAVENOUS
  Filled 2019-12-04: qty 2

## 2019-12-04 MED ORDER — MAGNESIUM SULFATE 2 GM/50ML IV SOLN
2.0000 g | Freq: Once | INTRAVENOUS | Status: AC
  Administered 2019-12-04: 11:00:00 2 g via INTRAVENOUS
  Filled 2019-12-04: qty 50

## 2019-12-04 MED ORDER — PHENYLEPHRINE HCL-NACL 10-0.9 MG/250ML-% IV SOLN
INTRAVENOUS | Status: AC
  Filled 2019-12-04: qty 250

## 2019-12-04 MED ORDER — METHYLPREDNISOLONE SODIUM SUCC 40 MG IJ SOLR
40.0000 mg | Freq: Every day | INTRAMUSCULAR | Status: DC
  Administered 2019-12-04: 11:00:00 40 mg via INTRAVENOUS
  Filled 2019-12-04: qty 1

## 2019-12-04 MED ORDER — EPINEPHRINE HCL 5 MG/250ML IV SOLN IN NS
0.5000 ug/min | INTRAVENOUS | Status: DC
  Filled 2019-12-04: qty 250

## 2019-12-04 MED ORDER — AMIODARONE HCL IN DEXTROSE 360-4.14 MG/200ML-% IV SOLN
30.0000 mg/h | INTRAVENOUS | Status: DC
  Filled 2019-12-04: qty 200

## 2019-12-04 MED ORDER — NOREPINEPHRINE 4 MG/250ML-% IV SOLN
INTRAVENOUS | Status: AC
  Filled 2019-12-04: qty 250

## 2019-12-04 MED ORDER — LORAZEPAM 2 MG/ML IJ SOLN
1.0000 mg | Freq: Once | INTRAMUSCULAR | Status: AC
  Administered 2019-12-04: 1 mg via INTRAVENOUS
  Filled 2019-12-04: qty 1

## 2019-12-04 MED ORDER — AMIODARONE HCL IN DEXTROSE 360-4.14 MG/200ML-% IV SOLN
60.0000 mg/h | INTRAVENOUS | Status: AC
  Administered 2019-12-04 (×2): 60 mg/h via INTRAVENOUS
  Filled 2019-12-04: qty 200

## 2019-12-04 MED ORDER — POTASSIUM CHLORIDE 10 MEQ/100ML IV SOLN
10.0000 meq | INTRAVENOUS | Status: AC
  Administered 2019-12-04: 12:00:00 10 meq via INTRAVENOUS
  Filled 2019-12-04 (×2): qty 100

## 2019-12-04 MED ORDER — NOREPINEPHRINE 4 MG/250ML-% IV SOLN: 0.0000 ug/min | INTRAVENOUS | Status: DC

## 2019-12-04 MED ORDER — METHYLPREDNISOLONE SODIUM SUCC 40 MG IJ SOLR: 40.0000 mg | Freq: Two times a day (BID) | INTRAMUSCULAR | Status: DC

## 2019-12-04 MED ORDER — LEVALBUTEROL HCL 0.63 MG/3ML IN NEBU: 0.6300 mg | INHALATION_SOLUTION | Freq: Four times a day (QID) | RESPIRATORY_TRACT | Status: DC | PRN

## 2019-12-04 MED ORDER — ATROPINE SULFATE 1 MG/10ML IJ SOSY
PREFILLED_SYRINGE | INTRAMUSCULAR | Status: AC
  Filled 2019-12-04: qty 10

## 2019-12-04 MED ORDER — METOPROLOL TARTRATE 5 MG/5ML IV SOLN
5.0000 mg | Freq: Four times a day (QID) | INTRAVENOUS | Status: DC
  Administered 2019-12-04: 11:00:00 5 mg via INTRAVENOUS
  Filled 2019-12-04: qty 5

## 2019-12-04 MED ORDER — VANCOMYCIN HCL 1250 MG/250ML IV SOLN: 1250.0000 mg | INTRAVENOUS | Status: DC

## 2019-12-04 MED ORDER — VANCOMYCIN HCL 1500 MG/300ML IV SOLN
1500.0000 mg | Freq: Once | INTRAVENOUS | Status: AC
  Administered 2019-12-04: 11:00:00 1500 mg via INTRAVENOUS
  Filled 2019-12-04: qty 300

## 2019-12-04 MED ORDER — AMIODARONE LOAD VIA INFUSION
150.0000 mg | Freq: Once | INTRAVENOUS | Status: AC
  Administered 2019-12-04: 09:00:00 150 mg via INTRAVENOUS
  Filled 2019-12-04: qty 83.34

## 2019-12-04 MED ORDER — IPRATROPIUM-ALBUTEROL 0.5-2.5 (3) MG/3ML IN SOLN
3.0000 mL | RESPIRATORY_TRACT | Status: AC | PRN
  Administered 2019-12-04: 3 mL via RESPIRATORY_TRACT
  Filled 2019-12-04: qty 3

## 2019-12-04 MED ORDER — FENTANYL CITRATE (PF) 100 MCG/2ML IJ SOLN
INTRAMUSCULAR | Status: AC
  Filled 2019-12-04: qty 2

## 2019-12-04 MED FILL — Medication: Qty: 1 | Status: AC

## 2019-12-05 LAB — TYPE AND SCREEN
ABO/RH(D): A POS
Antibody Screen: POSITIVE
Donor AG Type: NEGATIVE
Donor AG Type: NEGATIVE
Donor AG Type: NEGATIVE
Donor AG Type: NEGATIVE
Unit division: 0
Unit division: 0
Unit division: 0
Unit division: 0

## 2019-12-05 LAB — BPAM RBC
Blood Product Expiration Date: 202103052359
Blood Product Expiration Date: 202103152359
Blood Product Expiration Date: 202103192359
Blood Product Expiration Date: 202103192359
ISSUE DATE / TIME: 202102140220
ISSUE DATE / TIME: 202102140454
ISSUE DATE / TIME: 202102151535
Unit Type and Rh: 5100
Unit Type and Rh: 5100
Unit Type and Rh: 5100
Unit Type and Rh: 6200

## 2019-12-06 ENCOUNTER — Other Ambulatory Visit: Payer: Medicare Other

## 2019-12-06 NOTE — Progress Notes (Signed)
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 female who has been referred to [place] by Dr. Eliseo Squires for evaluation and management of symptomatic anemia.   Patient has history of hypertension, dyslipidemia, atrial fibrillation on anticoagulation with previous history of CVA x2, obesity who presented with generalized weakness.  Prior to admission she had an episode of blurring of vision especially in the right eye associated with a spell of confusion and possible loss of consciousness for a few seconds associated with disorientation.  She notes that she has also been feeling progressively fatigued for a few weeks and has had difficulty with dyspnea on exertion with short distances. Her daughters insisted she get evaluated and so she came to the emergency room for further evaluation. She does follow-up with cardiology and is being evaluated by pulmonary at the end of the month as well.  Patient previously has been on Coumadin from 2000 but was switched to Xarelto several years ago.  She notes no overt evidence of black stools or bleeding in the stools, no hematuria no nosebleeds no gum bleeds. Notes that she has been generally eating okay. Does endorse new night sweats over the last month or 2. No abdominal pain or significant change in bowel habits.  In the emergency room the patient had labs which showed hemoglobin of 6.6.5 with an MCV of 96.5.  Normal WBC count of 7.2k with elevated platelets of 685k which on repeat were down to 573k. Reticulocyte counts were relatively suppressed. LDH was elevated to 517 but haptoglobin was within normal limits at 45. Coombs negative Myeloma  panel showed no M spike Ferritin level was elevated to 845 suggestive of acute phase reaction/inflammation. B12 and folate within normal limits.  Patient received 2 units of PRBCs with an appropriate bump in her hemoglobin level and felt much better. CT chest abdomen pelvis was done which showed mild splenomegaly without a focal splenic lesion.  Multiple hepatic cysts.  Abnormal hypoenhancement in the right kidney lower pole of undetermined etiology -broad differential based on radiology input. right perirenal stranding is associated with a small amount of fluid in the adjacent right paracolic gutter, and a small fluid collection along the inferior margin of the liver which could be an exophytic cyst, small complex fluid collection, or conceivably a tumor nodule.  No other significant other lymphadenopathy noted.  Patient has subsequently had a bone marrow examination with the results currently pending.  Current Treatment: Cycle 4 of Rituxan  Interval History:   I connected with  [name] on .11/30/2019  by telephone and verified that I am speaking with the correct person using two identifiers.   I discussed the limitations of evaluation and management by telemedicine. The patient expressed understanding and agreed to proceed.  Other persons participating in the visit and their role in the encounter:      -Yevette Edwards, [place], pt's daughter  Patient's location: Home Provider's location: [place] at [facility]  Patient had a televisit for follow-up of her MDS/MPN and autoimmune hemolytic anemia. In the interval she has been seen by Dr. Leretha Pol at [facility].  Repeat Coombs testing at [facility] was only positive  for CD3 be and not IgG.  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 We discussed that we are not sure if the cold agglutinins are related to her recent Covid infection or have been present previously. She was already getting blood through blood warmers previously. Given that  she had already metabolic anemia refractory to Rituxan and steroids Dr. 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 We discussed the limitations of this treatment and the risks given her underlying MDS/MPN.  We also discussed that we would want her post Covid respiratory status to be stable.  Patient and her daughter notes that her cough has gotten worse recently and that she is getting a chest x-ray and evaluation by her primary care physician today.  On review of systems, pt reports fatigue, worsening shortness of breath.  Past Medical History:  Diagnosis Date  . Chronic lower back pain   . Hypercholesteremia   . Hypertension    mild  . Obesity   . Paroxysmal atrial fibrillation (HCC)    managed with anticoagulation and rate control.   . Stroke [facility]) 2001 & 2015   "right side gets weak sometimes if I'm only tired" (03/16/2016)    SURGICAL HISTORY: Past Surgical History:  Procedure Laterality Date  . CARDIAC CATHETERIZATION  03/31/2000   EF 49%/LAD lg vessel which coursed to the apex & gave rise to 2 diagonal branches/ LAD noted to have 30% ostial lesion & tapers to a sm vessel toward the apex but no high grade lesion/1st diagonal med sized vessel & 2nd diagonal sm vessel with no significant disease/ lt ventriculogram reveals mild global hypokinesis w/ an estimated EF of 45-50%  . CARDIAC CATHETERIZATION N/A 03/17/2016   Procedure: Left Heart Cath and Coronary Angiography;  Surgeon: Wellington Hampshire, MD;  Location: [place] [facility];  Service: Cardiovascular;  Laterality: N/A;  . LEFT HEART CATH AND CORONARY ANGIOGRAPHY N/A 03/31/2018   Procedure: LEFT HEART CATH AND CORONARY ANGIOGRAPHY;  Surgeon: Martinique, Peter M, MD;  Location: [place] [facility];  Service: Cardiovascular;  Laterality: N/A;    SOCIAL HISTORY: Social History   Socioeconomic History  . Marital status: Married    Spouse name: Not on file  . Number of children: 2  . Years of  education: Not on file  . Highest education level: Not on file  Occupational History  . Occupation: Herbalist  Tobacco Use  . Smoking status: Former Smoker    Packs/day: 0.12    Years: 15.00    Pack years: 1.80    Types: Cigarettes    Quit date: 10/18/1978    Years since quitting: 41.1  . Smokeless tobacco: Never Used  Substance and Sexual Activity  . Alcohol use: Yes    Alcohol/week: 14.0 standard drinks    Types: 14 Glasses of wine per week  . Drug use: No  . Sexual activity: Yes  Other Topics Concern  . Not on file  Social History Narrative  . Not on file   Social Determinants of Health   Financial Resource Strain:   . Difficulty of Paying Living Expenses: Not on file  Food Insecurity:   . Worried About Charity fundraiser in the Last Year: Not on file  . Ran Out of Food in the Last Year: Not on file  Transportation Needs:   . Lack of Transportation (Medical): Not on file  . Lack of Transportation (Non-Medical): Not on file  Physical Activity:   . Days of Exercise per  Week: Not on file  . Minutes of Exercise per Session: Not on file  Stress:   . Feeling of Stress : Not on file  Social Connections:   . Frequency of Communication with Friends and Family: Not on file  . Frequency of Social Gatherings with Friends and Family: Not on file  . Attends Religious Services: Not on file  . Active Member of Clubs or Organizations: Not on file  . Attends Archivist Meetings: Not on file  . Marital Status: Not on file  Intimate Partner Violence:   . Fear of Current or Ex-Partner: Not on file  . Emotionally Abused: Not on file  . Physically Abused: Not on file  . Sexually Abused: Not on file    FAMILY HISTORY: Family History  Problem Relation Age of Onset  . Heart disease Father   . Asthma Father   . Diabetes Father   . Heart disease Mother   . Heart disease Brother   . Hyperlipidemia Sister   . Hyperlipidemia Brother   . Hyperlipidemia Brother   .  Cancer Neg Hx     ALLERGIES:  is allergic to sulfonamide derivatives; lipitor [atorvastatin]; pravachol [pravastatin sodium]; and statins.  MEDICATIONS:  Current Outpatient Medications  Medication Sig Dispense Refill  . Ascorbic Acid (VITAMIN C PO) Take 1 tablet by mouth daily.    [name] Kitchen b complex vitamins capsule Take 1 capsule by mouth daily.     . cholecalciferol (VITAMIN D) 1000 UNITS tablet Take 1,000 Units by mouth daily.    . cyanocobalamin 1000 MCG tablet Take 1,000 mcg by mouth daily.     . fenofibrate 160 MG tablet Take 1 tablet (160 mg total) by mouth daily. 90 tablet 3  . fluticasone (FLONASE) 50 MCG/ACT nasal spray Place 1 spray into both nostrils daily.    . folic acid (FOLVITE) 1 MG tablet Take 5 tablets (5 mg total) by mouth daily. (Patient taking differently: Take 2 mg by mouth daily. ) 60 tablet 22  . metoprolol tartrate (LOPRESSOR) 25 MG tablet Take 0.5 tablets (12.5 mg total) by mouth 2 (two) times daily.    . nitrofurantoin, macrocrystal-monohydrate, (MACROBID) 100 MG capsule Take 100 mg by mouth 2 (two) times daily.    . predniSONE (DELTASONE) 20 MG tablet Take 3 tablets (60 mg total) by mouth daily with breakfast. 90 tablet 0  . XARELTO 20 MG TABS tablet TAKE 1 TABLET BY MOUTH EVERY DAY 30 tablet 5   No current facility-administered medications for this visit.    REVIEW OF SYSTEMS:   A 10+ POINT REVIEW OF SYSTEMS WAS OBTAINED including neurology, dermatology, psychiatry, cardiac, respiratory, lymph, extremities, GI, GU, Musculoskeletal, constitutional, breasts, reproductive, HEENT.  All pertinent positives are noted in the HPI.  All others are negative.   PHYSICAL EXAMINATION: ECOG FS:1 - Symptomatic but completely ambulatory  There were no vitals filed for this visit. Wt Readings from Last 3 Encounters:  2019/12/27 179 lb 3.7 oz (81.3 kg)  11/06/19 163 lb 5.8 oz (74.1 kg)  10/28/19 165 lb 11.2 oz (75.2 kg)   There is no height or weight on file to calculate BMI.      Telehealth visit  LABORATORY DATA:  I have reviewed the data as listed  Component     Latest Ref Rng & Units 11/28/2019  WBC     4.0 - 10.5 K/uL 4.5  RBC     3.87 - 5.11 MIL/uL 1.91 (L)  Hemoglobin     12.0 -  15.0 g/dL 6.3 (LL)  HCT     36.0 - 46.0 % 18.2 (L)  MCV     80.0 - 100.0 fL 95.3  MCH     26.0 - 34.0 pg 33.0  MCHC     30.0 - 36.0 g/dL 34.6  RDW     11.5 - 15.5 % 20.7 (H)  Platelets     150 - 400 K/uL 311  nRBC     0.0 - 0.2 % 0.4 (H)  Neutrophils     % 79  NEUT#     1.7 - 7.7 K/uL 3.6  Lymphocytes     % 17  Lymphocyte #     0.7 - 4.0 K/uL 0.8  Monocytes Relative     % 3  Monocyte #     0.1 - 1.0 K/uL 0.1  Eosinophil     % 0  Eosinophils Absolute     0.0 - 0.5 K/uL 0.0  Basophil     % 0  Basophils Absolute     0.0 - 0.1 K/uL 0.0  Immature Granulocytes     % 1  Abs Immature Granulocytes     0.00 - 0.07 K/uL 0.03  Sodium     135 - 145 mmol/L 140  Potassium     3.5 - 5.1 mmol/L 5.1  Chloride     98 - 111 mmol/L 106  CO2     22 - 32 mmol/L 21 (L)  Glucose     70 - 99 mg/dL 140 (H)  BUN     8 - 23 mg/dL 24 (H)  Creatinine     0.44 - 1.00 mg/dL 0.85  Calcium     8.9 - 10.3 mg/dL 7.8 (L)  Total Protein     6.5 - 8.1 g/dL 5.7 (L)  Albumin     3.5 - 5.0 g/dL 3.4 (L)  AST     15 - 41 U/L 22  ALT     0 - 44 U/L 23  Alkaline Phosphatase     38 - 126 U/L 44  Total Bilirubin     0.3 - 1.2 mg/dL 4.0 (HH)  GFR, Est Non African American     >60 mL/min >60  GFR, Est African American     >60 mL/min >60  Anion gap     5 - 15 13   05/31/18 Molecular Pathology:   05/24/18 Cytogenetics:     05/24/18 BM Bx:   05/24/18 BM Flow cytometry:     RADIOGRAPHIC STUDIES: I have personally reviewed the radiological images as listed and agreed with the findings in the report. DG Chest 2 View  Result Date: 11/30/2019 CLINICAL DATA:  Shortness of breath EXAM: CHEST - 2 VIEW COMPARISON:  11/05/2019 FINDINGS: Cardiomegaly. Bibasilar atelectasis.  No overt edema. Patchy peripheral opacity again noted in the left lower lung, unchanged since prior study. IMPRESSION: Cardiomegaly, bibasilar atelectasis. Continued patchy peripheral opacity in the left lower lung could reflect pneumonia. Electronically Signed   By: Rolm Baptise M.D.   On: 11/30/2019 19:08   DG CHEST PORT 1 VIEW  Result Date: 12-24-2019 CLINICAL DATA:  Shortness of breath, cough EXAM: PORTABLE CHEST 1 VIEW COMPARISON:  11/23/2019 FINDINGS: Patchy bilateral airspace disease has worsened since prior study concerning for worsening pneumonia. Cardiomegaly. No effusions or acute bony abnormality. IMPRESSION: Worsening bilateral multifocal pneumonia Electronically Signed   By: Rolm Baptise M.D.   On: 2019/12/24 08:58   DG Chest Portable 1 View  Result  Date: 12/02/2019 CLINICAL DATA:  Cough EXAM: PORTABLE CHEST 1 VIEW COMPARISON:  November 30, 2019 FINDINGS: The mediastinal contour and cardiac silhouette are normal. Patchy opacities of bilateral lung bases are noted. There is no pleural effusion. The bony structures are stable. IMPRESSION: Patchy opacities of bilateral lung bases, suspicious for pneumonia. Electronically Signed   By: Abelardo Diesel M.D.   On: 12/12/2019 13:57    ASSESSMENT & PLAN:   74 y.o. 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  In the emergency room the patient had labs which showed hemoglobin of 6.6.5 with an MCV of 96.5.  Normal WBC count of 7.2k with elevated platelets of 685k which on repeat were down to 573k. Reticulocyte counts were relatively suppressed. LDH was elevated to 517 but haptoglobin was within normal limits at 45. Coombs negative Myeloma panel showed no M spike Ferritin level was elevated to 845 suggestive of acute phase reaction/inflammation. B12 and folate within normal limits.  05/24/18 BM Bx results which revealed erythroid and megakaryocytic proliferation and ring sideroblasts concerning for  myeloproliferative/ myelodysplastic neoplasm  No excess blasts; Acute leukemia and lymphoma are not indicated   2.h/o  Rt renal radiographic abnormality as noted in CT with some fluid collection. 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 No fevers chills to suggest acute pyelonephritis or renal abscess. Urine analysis showed trace leuk esterase and 6-10 WBCs no significant hematuria. Broad differential diagnosis based on CT findings as per radiologist. Procalcitonin levels unimpressive  05/23/18 CT Chest Abdomen Pelvis was done which showed mild splenomegaly without a focal splenic lesion. Multiple hepatic cysts.  Abnormal hypoenhancement in the right kidney lower pole of undetermined etiology -broad differential based on radiology input. right perirenal stranding is associated with a small amount of fluid in the adjacent right paracolic gutter, and a small fluid collection along the inferior margin of the liver which could be an exophytic cyst, small complex fluid collection, or conceivably a tumor nodule. No other significant other lymphadenopathy noted.   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  Previously Coombs test was positive for IgG and C3b.  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 Unclear if the cold agglutinins are from her recent Covid infection.  4. H/o recurrent Atrial fibrillation with RVR -Admitted pt to [facility] on 10/24/2019 for SOB and weakness due to Atrial Fibrillation with RVR and suspected COVID 19 infection likely from exposure to her daughter who was recently diagnosed. Patient daughter informed us about this after clinic visit  PLAN: -Patient received 2 units of PRBCs with  blood warmer on 11/28/2019 for symptomatic anemia. -Discussed with her recommendations from Dr. Randa Spike at [facility].  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 -We discussed the dangers and limitations of using this combination in the setting of her underlying bone marrow disorder MDS/MPN. -We also discussed that she needs to have pulmonary recovery from Covid infection and has to have her recent worsening pulmonary symptoms evaluated. -Continue 60 mg Predisone and we shall taper this.  Followup -Labs and PRBC transfusion x 1 unit on Monday and Thursday x 6 starting 12/03/2019 -chemo-counseling for Benadmustine/RItuxan next week -Port a  cath placement in 3-4 days -MD visit, labs and schedule to start Bendamustine/Rituxan on 12/10/2019     The total time spent in the appt was 20 minutes and more than 50% was on counseling and direct patient cares.  All of the patient's questions were answered with apparent satisfaction. The patient knows to call the clinic with any problems, questions or concerns.   Sullivan Lone MD [place] AAHIVMS [facility] Hematology/Oncology Physician [facility]  (Office):       [phone] (Work cell):  [phone] (Fax):           [phone]

## 2019-12-08 LAB — CULTURE, BLOOD (ROUTINE X 2)
Culture: NO GROWTH
Culture: NO GROWTH
Special Requests: ADEQUATE
Special Requests: ADEQUATE

## 2019-12-10 ENCOUNTER — Ambulatory Visit: Payer: Medicare Other

## 2019-12-13 ENCOUNTER — Other Ambulatory Visit: Payer: Medicare Other

## 2019-12-17 ENCOUNTER — Other Ambulatory Visit: Payer: Medicare Other

## 2019-12-17 NOTE — ED Provider Notes (Signed)
[place]  Department of Emergency Medicine   Code Blue CONSULT NOTE  Chief Complaint: Cardiac arrest/unresponsive   Level V Caveat: Unresponsive  History of present illness: I was contacted by the hospital for a CODE BLUE cardiac arrest upstairs and presented to the patient's bedside.    ROS: Unable to obtain, Level V caveat  Scheduled Meds: . sodium chloride   Intravenous Once  . atropine      . budesonide (PULMICORT) nebulizer solution  0.25 mg Nebulization BID  . Chlorhexidine Gluconate Cloth  6 each Topical Daily  . diltiazem  10 mg Intravenous Once  . fenofibrate  160 mg Oral Daily  . fentaNYL      . folic acid  5 mg Oral Daily  . guaiFENesin  600 mg Oral BID  . ipratropium-albuterol  3 mL Nebulization TID  . mouth rinse  15 mL Mouth Rinse BID  . methylPREDNISolone (SOLU-MEDROL) injection  40 mg Intravenous Q12H  . metoprolol tartrate  5 mg Intravenous Q6H  . midazolam      . multivitamin with minerals  1 tablet Oral Daily  . protein supplement  2 Scoop Oral TID WC   Continuous Infusions: . acetaminophen 1,000 mg (2019/12/10 1201)  . amiodarone    . azithromycin Stopped (12/03/19 1922)  . ceFEPime (MAXIPIME) IV Stopped (Dec 10, 2019 1032)  . diltiazem (CARDIZEM) infusion 15 mg/hr (December 10, 2019 1225)  . epinephrine    . norepinephrine    . norepinephrine    . norepinephrine (LEVOPHED) Adult infusion    . phenylephrine    . [START ON 12/05/2019] vancomycin     PRN Meds:.acetaminophen **OR** acetaminophen, benzonatate, levalbuterol, polyethylene glycol, sodium chloride Past Medical History:  Diagnosis Date  . Chronic lower back pain   . Hypercholesteremia   . Hypertension    mild  . Obesity   . Paroxysmal atrial fibrillation (HCC)    managed with anticoagulation and rate control.   . Stroke [facility]) 2001 & 2015   "right side gets weak sometimes if I'm only tired" (03/16/2016)   Past Surgical History:  Procedure Laterality Date  . CARDIAC  CATHETERIZATION  03/31/2000   EF 49%/LAD lg vessel which coursed to the apex & gave rise to 2 diagonal branches/ LAD noted to have 30% ostial lesion & tapers to a sm vessel toward the apex but no high grade lesion/1st diagonal med sized vessel & 2nd diagonal sm vessel with no significant disease/ lt ventriculogram reveals mild global hypokinesis w/ an estimated EF of 45-50%  . CARDIAC CATHETERIZATION N/A 03/17/2016   Procedure: Left Heart Cath and Coronary Angiography;  Surgeon: Wellington Hampshire, MD;  Location: [place] [facility];  Service: Cardiovascular;  Laterality: N/A;  . LEFT HEART CATH AND CORONARY ANGIOGRAPHY N/A 03/31/2018   Procedure: LEFT HEART CATH AND CORONARY ANGIOGRAPHY;  Surgeon: Martinique, Peter M, MD;  Location: [place] [facility];  Service: Cardiovascular;  Laterality: N/A;   Social History   Socioeconomic History  . Marital status: Married    Spouse name: Not on file  . Number of children: 2  . Years of education: Not on file  . Highest education level: Not on file  Occupational History  . Occupation: Herbalist  Tobacco Use  . Smoking status: Former Smoker    Packs/day: 0.12    Years: 15.00    Pack years: 1.80    Types: Cigarettes    Quit date: 10/18/1978    Years since quitting: 41.1  . Smokeless tobacco: Never  Used  Substance and Sexual Activity  . Alcohol use: Yes    Alcohol/week: 14.0 standard drinks    Types: 14 Glasses of wine per week  . Drug use: No  . Sexual activity: Yes  Other Topics Concern  . Not on file  Social History Narrative  . Not on file   Social Determinants of Health   Financial Resource Strain:   . Difficulty of Paying Living Expenses: Not on file  Food Insecurity:   . Worried About Charity fundraiser in the Last Year: Not on file  . Ran Out of Food in the Last Year: Not on file  Transportation Needs:   . Lack of Transportation (Medical): Not on file  . Lack of Transportation (Non-Medical): Not on file  Physical Activity:   .  Days of Exercise per Week: Not on file  . Minutes of Exercise per Session: Not on file  Stress:   . Feeling of Stress : Not on file  Social Connections:   . Frequency of Communication with Friends and Family: Not on file  . Frequency of Social Gatherings with Friends and Family: Not on file  . Attends Religious Services: Not on file  . Active Member of Clubs or Organizations: Not on file  . Attends Archivist Meetings: Not on file  . Marital Status: Not on file  Intimate Partner Violence:   . Fear of Current or Ex-Partner: Not on file  . Emotionally Abused: Not on file  . Physically Abused: Not on file  . Sexually Abused: Not on file   Allergies  Allergen Reactions  . Sulfonamide Derivatives Anaphylaxis  . Lipitor [Atorvastatin] Other (See Comments)    Caused bladder infection  . Pravachol [Pravastatin Sodium] Other (See Comments)    Myalgias  . Statins Other (See Comments)    Myalgia    Last set of Vital Signs (not current) Vitals:   01/02/20 1320 2020-01-02 1322  BP: (!) 78/56 (!) 64/49  Pulse:    Resp: 14 (!) 32  Temp:    SpO2:        Procedures      Medical Decision making  74 year old lady admitted to ICU for multifocal pneumonia.  CODE BLUE called.  Responded to bedside.  ICU attending present at bedside during code.  Had already intubated by critical care.  Further care per critical care team.  Later in afternoon, CODE BLUE called again and I again responded to bedside, critical care team still present.  When I arrived, code had been called, patient expired, family at bedside.    Lucrezia Starch, MD 01-02-2020 [phone]

## 2019-12-17 NOTE — Progress Notes (Signed)
Wasted 1mg  ativan in steristyle with Mason Jim, RN.

## 2019-12-17 NOTE — Progress Notes (Signed)
PCCM progress note  Through the day patient increasingly hypoxic with work of breathing She was intubated without difficulty around 12:40 pm  5 mins after intubation she went into cardiac arrest x2 with PEA/asystole requiring 10 and 5 minutes of CPR to ROSC Started on Levophed, epi drip Repeat labs sent  Prognosis for recovery is very poor Discussed with daughter at bedside who changed CODE STATUS to limited code with no CPR. Her other daughter is on way from [place] with expected arrival today evening. Continue current medical treatment with no escalation  The patient is critically ill with multiple organ system failure and requires high complexity decision making for assessment and support, frequent evaluation and titration of therapies, advanced monitoring, review of radiographic studies and interpretation of complex data.   Critical Care Time devoted to patient care services, exclusive of separately billable procedures, described in this note is 70 minutes.   Marshell Garfinkel MD [place] [facility] Please see [email or web address] for pager details.  2019/12/13, 1:53 PM

## 2019-12-17 NOTE — Procedures (Signed)
Intubation Procedure Note Alexandra Pierce [id number] 04/26/1946  Procedure: Intubation Indications: Respiratory insufficiency  Procedure Details Consent: Unable to obtain consent because of emergent medical necessity. Time Out: Verified patient identification, verified procedure, site/side was marked, verified correct patient position, special equipment/implants available, medications/allergies/relevent history reviewed, required imaging and test results available.  Performed  Maximum sterile technique was used including gloves, gown, hand hygiene, mask and sheet.  Glide scope Blade 3, 1 attempt  Evaluation Hemodynamic Status: BP stable throughout; O2 sats: stable throughout Patient's Current Condition: stable Complications: No apparent complications Patient did tolerate procedure well. Chest X-ray ordered to verify placement.  CXR: pending.  Marshell Garfinkel MD [facility] Please see [email or web address] for pager details.  12/24/2019, 1:46 PM

## 2019-12-17 NOTE — Progress Notes (Addendum)
Progress Note  Patient Name: Alexandra Pierce Date of Encounter: 12/05/19  Primary Cardiologist: Truitt Merle, NP   Subjective   Patient remains in Afib with elevated rates>> 120s during my exam. Was placed on nonrebreather overnight due to sats in the 80s. Patient says she feels very cold this AM.  Inpatient Medications    Scheduled Meds: . sodium chloride   Intravenous Once  . budesonide (PULMICORT) nebulizer solution  0.25 mg Nebulization BID  . Chlorhexidine Gluconate Cloth  6 each Topical Daily  . diltiazem  10 mg Intravenous Once  . fenofibrate  160 mg Oral Daily  . folic acid  5 mg Oral Daily  . guaiFENesin  600 mg Oral BID  . ipratropium-albuterol  3 mL Nebulization TID  . mouth rinse  15 mL Mouth Rinse BID  . multivitamin with minerals  1 tablet Oral Daily  . predniSONE  60 mg Oral Q breakfast  . protein supplement  2 Scoop Oral TID WC   Continuous Infusions: . azithromycin Stopped (12/03/19 1922)  . cefTRIAXone (ROCEPHIN)  IV Stopped (12/03/19 1810)  . diltiazem (CARDIZEM) infusion 12.5 mg/hr (Dec 05, 2019 0649)   PRN Meds: acetaminophen **OR** acetaminophen, benzonatate, polyethylene glycol, sodium chloride   Vital Signs    Vitals:   2019/12/05 0622 12/05/19 0630 12/05/19 0700 12-05-2019 0725  BP:  (!) 116/54 116/65   Pulse: (!) 109 (!) 114 (!) 130 (!) 119  Resp: (!) 25  (!) 26 (!) 26  Temp:      TempSrc:      SpO2: 90%  (!) 87% 96%  Weight:      Height:        Intake/Output Summary (Last 24 hours) at 12-05-2019 0801 Last data filed at 2019/12/05 0436 Gross per 24 hour  Intake 1884.93 ml  Output 2000 ml  Net -115.07 ml   Last 3 Weights 05-Dec-2019 12/03/2019 12/02/2019  Weight (lbs) 179 lb 3.7 oz 174 lb 6.1 oz 167 lb 8.8 oz  Weight (kg) 81.3 kg 79.1 kg 76 kg      Telemetry    Afib, HR 100-140s - Personally Reviewed  ECG    No new - Personally Reviewed  Physical Exam   GEN: No acute distress.   Neck: No JVD Cardiac: Irreg Irreg, no murmurs, rubs,  or gallops.  Respiratory: Clear to auscultation bilaterally; 15L O2 GI: Soft, nontender, non-distended  MS: No edema; No deformity. Neuro:  Nonfocal  Psych: Normal affect   Labs    High Sensitivity Troponin:   Recent Labs  Lab 11/05/19 1635 11/05/19 1837  TROPONINIHS 5 7      Chemistry Recent Labs  Lab 11/24/2019 1403 11/30/2019 1403 12/02/19 0838 12/02/19 0841 12/03/19 0311 December 05, 2019 0230  NA 136   < > 137  --  139 137  K 3.9   < > 3.8  --  4.4 3.6  CL 103   < > 108  --  108 103  CO2 22   < > 20*  --  23 25  GLUCOSE 140*   < > 120*  --  166* 204*  BUN 29*   < > 18  --  17 17  CREATININE 0.93   < > 0.75  --  0.66 0.73  CALCIUM 8.7*   < > 7.8*  --  8.0* 7.9*  PROT 6.3*  --   --   --  5.2* 5.5*  ALBUMIN 3.5  --   --   --  2.9* 2.7*  AST 15  --   --   --  7* 9*  ALT 25  --   --   --  14 17  ALKPHOS 44  --   --   --  31* 34*  BILITOT 3.5*   < >  --  2.0* 1.7* 1.7*  GFRNONAA >60   < > >60  --  >60 >60  GFRAA >60   < > >60  --  >60 >60  ANIONGAP 11   < > 9  --  8 9   < > = values in this interval not displayed.     Hematology Recent Labs  Lab 12/02/19 [id number] 12/02/19 0838 12/03/19 0311 12/03/19 2037 December 28, 2019 0230  WBC 4.2  --  4.2  --  3.9*  RBC 2.39*  --  2.23*  --  2.61*  HGB 7.1*   < > 6.6* 8.3* 7.7*  HCT 22.5*   < > 20.8* 25.2* 23.9*  MCV 94.1  --  93.3  --  91.6  MCH 29.7  --  29.6  --  29.5  MCHC 31.6  --  31.7  --  32.2  RDW 19.2*  --  20.1*  --  19.0*  PLT 215  --  209  --  184   < > = values in this interval not displayed.    BNPNo results for input(s): BNP, PROBNP in the last 168 hours.   DDimer No results for input(s): DDIMER in the last 168 hours.   Radiology    No results found.  Cardiac Studies   Echo 10/11/19 1. Left ventricular ejection fraction, by visual estimation, is 60 to  65%. The left ventricle has normal function. There is no left ventricular  hypertrophy.  2. Left ventricular diastolic function could not be evaluated.  3.  The left ventricle has no regional wall motion abnormalities.  4. Global right ventricle has normal systolic function.The right  ventricular size is normal. No increase in right ventricular wall  thickness.  5. Left atrial size was severely dilated.  6. Right atrial size was normal.  7. Trivial pericardial effusion is present.  8. The mitral valve is normal in structure. Trivial mitral valve  regurgitation. No evidence of mitral stenosis.  9. The tricuspid valve is normal in structure.  10. The aortic valve is normal in structure. Aortic valve regurgitation is  not visualized. Mild aortic valve sclerosis without stenosis.  11. The pulmonic valve was grossly normal. Pulmonic valve regurgitation is  not visualized.  12. TR signal is inadequate for assessing pulmonary artery systolic  pressure.  13. The inferior vena cava is dilated in size with <50% respiratory  variability, suggesting right atrial pressure of 15 mmHg.   Patient Profile     74 y.o. 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   Assessment & Plan    Afib RVR - Patient continues to have afib with elevated rates into the 140s.  - She is on IV dilt 12.5 mg/hr>>unable to titrate due to low pressures - IV amiodarone bolus given yesterday for rate control which seemed to have improved rates. Had some lower rates into the 90s - Not on anticoagulation due to anemia - Denies BRBPR - Potassium 3.6 , goal >4 - Mg 1.9 - TSH 2.5 - Can consider  continued rate control with amiodarone  Symptomatic Anemia - Hgb 8.3 > 7.7 - IVIG per IM  Recent COVID infection - Patient continues to be SOB and hypoxic on 15L O2 - Being treated for CAP>>abx per IM  For questions or updates, please contact [place] Please consult [email or web address] for contact info under        Signed, Cadence Ninfa Meeker, PA-C  December 22, 2019, 8:01 AM    Attending Note:   The patient was seen and examined.  Agree with assessment and plan as noted above.  Changes made to the above note as needed.  Patient seen and independently examined with  Cadence Kathlen Mody, PA .   We discussed all aspects of the encounter. I agree with the assessment and plan as stated above.  1.   Atrial fib:  Rate is slighlty better on the IV amio.   Still has AF with RVR on Tele.   Will add metoprolol 5 mg IV Q 6 hours. Discussed with Dr. Vaughan Browner.    Cont Diltiazem.  Cont. IV amio  2. Pneumonitis - s/p COVID infection.   Still very hypoxic at times.   Agree that intubation would be risky for worsening lung injury.  3.  Anemia:   Further recs per IM and PCCM.   A high level of decision making was required in the care if this patient - including discussion with Dr. Vaughan Browner.    I have spent a total of 40 minutes with patient reviewing hospital  notes , telemetry, EKGs, labs and examining patient as well as establishing an assessment and plan that was discussed with the patient. > 50% of time was spent in direct patient care.    Thayer Headings, Brooke Bonito., MD, [facility] Dec 22, 2019, 10:50 AM [address]  [place] Pager [phone]

## 2019-12-17 NOTE — Procedures (Signed)
Central Venous Catheter Insertion Procedure Note Alexandra Pierce [id number] 07-Oct-1946  Procedure: Insertion of Central Venous Catheter Indications: Drug and/or fluid administration  Procedure Details Consent: Risks of procedure as well as the alternatives and risks of each were explained to the (patient/caregiver).  Consent for procedure obtained. Time Out: Verified patient identification, verified procedure, site/side was marked, verified correct patient position, special equipment/implants available, medications/allergies/relevent history reviewed, required imaging and test results available.  Performed  Maximum sterile technique was used including antiseptics, cap, gloves, gown, hand hygiene, mask and sheet. Skin prep: Chlorhexidine; local anesthetic administered A antimicrobial bonded/coated triple lumen catheter was placed in the right internal jugular vein using the Seldinger technique.  Evaluation Blood flow good Complications: No apparent complications Patient did tolerate procedure well. Chest X-ray ordered to verify placement.  CXR: pending.  Alexandra Pierce December 10, 2019, 1:46 PM

## 2019-12-17 NOTE — Progress Notes (Addendum)
   12/18/19 0618  Vitals  Pulse Rate (!) 109  ECG Heart Rate (!) 109  Resp (!) 25  Oxygen Therapy  SpO2 (!) 88 %  O2 Flow Rate (L/min) 10 L/min  MEWS Score  MEWS Temp 0  MEWS Systolic 0  MEWS Pulse 1  MEWS RR 1  MEWS LOC 0  MEWS Score 2  MEWS Score Color Yellow   Through night, patient would mouth breathe and desaturate to 89% and recover once awakened. Weaned O2 down to 5L at one point during PM when saturations were 95-99%. Now as patient awakened this morning, saturations 80-83%, now had to be increased to 11L HFNC and maintaining 89%. Breath sounds diminished and sounds tight as if not moving much air. Pt did incentive spirometer with little change and just coughing to the point that patient refused to do more than 6 times. RT made aware and encouraged to continue the same treatment. Provider made aware increased oxygen demands.

## 2019-12-17 NOTE — Progress Notes (Addendum)
Pharmacy Antibiotic Note  Alexandra Pierce is a 74 y.o. female admitted on 12/15/2019 with dyspnea, cough. Initially treated with Rocephin/Azithromycin for CAP.  O2 requirements greatly increased, Pharmacy has been consulted for Vancomycin and Cefepime dosing.  Plan: Discontinue Rocephin Continue Azithromycin 500mg  IV q24 Cefepime 2gm q8 Vancomycin 1500mg  x1, then 1250mg  q24, AUC 509, Scr adj to 1.0 Begin SCr q48hr  Height: 5\' 6"  (167.6 cm) Weight: 179 lb 3.7 oz (81.3 kg) IBW/kg (Calculated) : 59.3  Temp (24hrs), Avg:99 F (37.2 C), Min:97.4 F (36.3 C), Max:101.1 F (38.4 C)  Recent Labs  Lab 11/28/19 0810 12/15/2019 1403 12/02/19 0838 12/03/19 0311 12/13/19 0230  WBC 4.5 7.4 4.2 4.2 3.9*  CREATININE 0.85 0.93 0.75 0.66 0.73    Estimated Creatinine Clearance: 65.3 mL/min (by C-G formula based on SCr of 0.73 mg/dL).    Allergies  Allergen Reactions  . Sulfonamide Derivatives Anaphylaxis  . Lipitor [Atorvastatin] Other (See Comments)    Caused bladder infection  . Pravachol [Pravastatin Sodium] Other (See Comments)    Myalgias  . Statins Other (See Comments)    Myalgia    Antimicrobials this admission: 2/13 azith >> 2/16 2/13 CTX>> 2/16 2/16 Cefepime >> 2/16 Vanc >>  Dose adjustments this admission:  Microbiology results: 2/15 BCx: sent 2/15 UCx: ng-final   Thank you for allowing pharmacy to be a part of this patient's care.  Minda Ditto PharmD 13-Dec-2019 9:16 AM

## 2019-12-17 NOTE — Progress Notes (Signed)
Pt placed on nonrebreather and HFNC at 15L due to oxygen saturations holding in the low 80s on just the HFNC at 15 liters. Dr. Louanne Belton paged.

## 2019-12-17 NOTE — Progress Notes (Signed)
Patient in respiratory distress on HFNC at 15L and NRB at 15L as well. Patient lung sounds diminished, SPO2 75, HR 187. MD paged for STAT bedside evaluation. Patient very anxious and restless. BP stable 151/97 (112.) Cardiology notified as well, PCCM NP rounding.

## 2019-12-17 NOTE — Progress Notes (Signed)
   2019/12/06 1300  Clinical Encounter Type  Visited With Family  Visit Type Initial;Follow-up;Spiritual support;Code;Critical Care  Referral From Nurse  Consult/Referral To Chaplain  Spiritual Encounters  Spiritual Needs Emotional;Other (Comment);Grief support (Spiritual Care Conversation/Support)  Stress Factors  Patient Stress Factors Not reviewed  Family Stress Factors Health changes;Major life changes   I provided support for Melissa, the patient's daughter, during the Code Blue of her mother. I provided her a prayer shawl. After speaking with ICU staff and the [facility], they gave the approval for the patient to have 4 visitors.  I collected those names from [facility] and let the front entrance staff know.   The people on the visitor list are: Oleh Genin, Lyn Hollingshead, and [facility] Shanon Ace M.Div., [facility]

## 2019-12-17 NOTE — Discharge Summary (Signed)
Physician Death Summary  Patient ID: Alexandra Pierce MRN: [id number] DOB/AGE: 75-10-1945 75 y.o.  Admit date: 12/05/2019 Discharge date: 2020/01/02  Admission Diagnoses: Acute respiratory failure  Discharge Diagnoses:  Cardiac arrest Acute respiratory failure Pneumonia Acute anemia Myelodysplastic syndrome COVID-19  Discharged Condition: Deceased  Hospital Course:  74 year old female with a history paroxysmal atrial fibrillation, hypertension, hyperlipidemia, CVA, MDS/MPN JAK 2 positive follows with Dr. Irene Limbo and recent COVID-19 pneumonia who presented with fever, cough and shortness of breath and found acute on chronic blood loss anemia. During hospital course she has been transfused blood, started on antibiotics for CAP and diuresed however continued to have persistent hypoxemia. For her COVID 19 infection diagnosed on 10/24/19, she was previously treated with dexamethasone and remdesivir and discharged home 2L O2 at home.  Hospital course complicated by rapid atrial fibrillation requiring amnio, Cardizem and Lopressor She developed increasing oxygen demand on 2/16 with chest x-ray showing bilateral infiltrates consistent with worsening pneumonia.  Post Covid pneumonitis, TRALI and TACO was also on the differential as she had received 3 units of blood during this admission.  Antibiotics broadened to Vanco, cefepime, Lasix given and steroid dose increased.  For her atrial fibrillation she was rebolused with amiodarone, continued on Cardizem drip and given IV Lopressor.  Heparin was not given due to presentation with low hemoglobin and concern for GI bleed.  She continued to deteriorate and was intubated in the afternoon of 12/16.  Shortly after intubation she went into cardiac arrest x2.  After discussion with family CODE STATUS was changed to DNR.  She had another cardiac arrest later that day and passed away  Marshell Garfinkel MD [place] [facility] Please see [email or web address] for pager  details.  12/06/2019, 2:09 PM

## 2019-12-17 NOTE — Progress Notes (Signed)
PROGRESS NOTE  Alexandra Pierce [id number] DOB: Jan 22, 1946 DOA: 11/28/2019 PCP: Chesley Noon, MD   LOS: 3 days   Brief narrative: As per HPI,  Patient is a 74 year old female with a history paroxysmal atrial fibrillation, hypertension, hyperlipidemia, CVA, MDS/MPN JAK 2 positive follows with Dr. Irene Limbo. Recent admission from 1/18-1/21 for symptomatic anemia requiring IVIG and found to have COVID 19 infection at that time. On prednisone 60 mg daily, discharged with 2L/min O2 via [place] at that time.Patient presented to the hospital with worsening cough and shortness of breath x 4 days, found to have Hb 5.8 at presentation.  Assessment/Plan:  Active Problems:   Symptomatic anemia   Shortness of breath  Shortness of breath, acute on chronic hypoxic respiratory failure multifactorial from post Covid pneumonitis, hematologic anemia, pneumonia, atrial fibrillation with rapid ventricular response.  Worsening hypoxia and dyspnea.  Requiring high flow nasal cannula and nonrebreather mask.  Pulmonary critical care and cardiology on board.  Rapid response today.  Patient is positive balance.  Lasix IV 1 dose will be given.  Patient has been started on amiodarone drip for rapid A. fib.  I again spoke with the patient regarding goals of care including intubation mechanical ventilation if necessary.  Patient states that she would want it but further discussion to be held with the family.  Patient is on 2 L of oxygen at baseline.  Patient is critically sick at this time and will need higher level of care  Atrial fibrillation with RVR:   On amiodarone/ Cardizem drip.  Cardiology on board.   Fever, cough with history of COVID-19: Patient was COVID-19 + on 10/24/2019.  Chest x-ray shows patchy opacities over the bilateral lung bases.  X-ray from today shows worsening infiltrates.  Patient is afebrile without any leukocytosis but is immunosuppressed on chronic prednisone.  Was on Rocephin and Zithromax.  Antibiotics  have been changed to vancomycin and cefepime.  CT scan of the chest has been ordered by critical care.  Cultures negative so far.  Pulmicort was added yesterday.  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  Direct antiglobulin test is positive.  Hematology on board.  Follow recommendation.  Patient was treated with rituximab in the past and is currently maintained on prednisone as outpatient.  Hyperlipidemia: Continue fenofibrate  Essential hypertension: Metoprolol, Cardizem.  History of CAD.  No acute chest pain.  Aspirin on hold.  Continue fenofibrate.  VTE Prophylaxis: SCD  Code Status: Full code.  Palliative care has also been consulted.  Family Communication: Spoke with the patient's daughter Ms. Melissa on the phone and updated her about the clinical condition of the patient and the worsening hypoxia.   Disposition Plan:  . Patient is from home  . Likely disposition undetermined at this time, patient is critically ill. . Barriers to discharge: Critically ill with increasing oxygen demand, significant anemia, progressive respiratory failure. Will transfer to ICU service. Spoke with Dr Vaughan Browner.  Consultants:  Hematooncology  Cardiology  Pulmonary critical care  Procedures:  PRBC  transfusion   Antibiotics:  . Rocephin and Zithromax 12/03/2019>2/16 . Vancomycin cefepime 2/16>  Anti-infectives (From admission, onward)   Start     Dose/Rate Route Frequency Ordered Stop   12/05/19 1100  vancomycin (VANCOREADY) IVPB 1250 mg/250 mL     1,250 mg 166.7 mL/hr over 90 Minutes Intravenous Every 24 hours 2019/12/07 1005     12/07/19 1100  vancomycin (VANCOREADY) IVPB 1500 mg/300 mL     1,500 mg 150  mL/hr over 120 Minutes Intravenous  Once 12-12-19 0919     12-12-2019 1000  ceFEPIme (MAXIPIME) 2 g in sodium chloride 0.9 % 100 mL IVPB     2 g 200 mL/hr over 30 Minutes Intravenous Every 8 hours December 12, 2019 0917     11/26/2019 1800   cefTRIAXone (ROCEPHIN) 1 g in sodium chloride 0.9 % 100 mL IVPB  Status:  Discontinued     1 g 200 mL/hr over 30 Minutes Intravenous Every 24 hours 12/15/2019 1646 Dec 12, 2019 0912   11/23/2019 1800  azithromycin (ZITHROMAX) 500 mg in sodium chloride 0.9 % 250 mL IVPB     500 mg 250 mL/hr over 60 Minutes Intravenous Every 24 hours 11/24/2019 1646       Subjective: Today, patient states that she is dyspneic and short winded.  Denies any chest pain.  Has been requiring high flow nasal cannula and had rapid response today for worsening hypoxia and tachycardia..  Objective: Vitals:   12-12-19 1000 12-Dec-2019 1100  BP: 133/73 131/78  Pulse: (!) 149 (!) 147  Resp: (!) 28 (!) 33  Temp:    SpO2: 93% (!) 86%    Intake/Output Summary (Last 24 hours) at 12-Dec-2019 1140 Last data filed at 12/12/2019 0436 Gross per 24 hour  Intake 1636.24 ml  Output 2000 ml  Net -363.76 ml   Filed Weights   12/02/19 0318 12/03/19 0420 2019/12/12 0400  Weight: 76 kg 79.1 kg 81.3 kg   Body mass index is 28.93 kg/m.   Physical Exam:  GENERAL: Patient is alert awake and oriented.  Anxious.  In moderate respiratory distress.  On High flow nasal cannula and nonrebreather mask. HENT: No scleral pallor or icterus. Pupils equally reactive to light. Oral mucosa is moist NECK: is supple, no gross swelling noted. CHEST:  Diminished breath sounds bilaterally.  Coarse breath sounds noted CVS: S1 and S2 heard, no murmur.  Irregularly irregular rhythm.  Tachycardic. ABDOMEN: Soft, non-tender, bowel sounds are present. EXTREMITIES: No edema. CNS: Cranial nerves are intact. No focal motor deficits. SKIN: warm and dry without rashes.  Data Review: I have personally reviewed the following laboratory data and studies,  CBC: Recent Labs  Lab 11/28/19 0810 11/28/19 0810 11/23/2019 1403 12/12/2019 1844 12/02/19 0838 12/03/19 0311 12/03/19 2037 12-12-2019 0230  WBC 4.5  --  7.4  --  4.2 4.2  --  3.9*  NEUTROABS 3.6  --   --  6.7   --  3.3  --   --   HGB 6.3*  --  5.8*  --  7.1* 6.6* 8.3* 7.7*  HCT 18.2*   < > 16.2*  --  22.5* 20.8* 25.2* 23.9*  MCV 95.3  --  95.9  --  94.1 93.3  --  91.6  PLT 311  --  313  --  215 209  --  184   < > = values in this interval not displayed.   Basic Metabolic Panel: Recent Labs  Lab 11/28/19 0810 11/23/2019 1403 12/02/19 0838 12/03/19 0311 12/12/19 0230  NA 140 136 137 139 137  K 5.1 3.9 3.8 4.4 3.6  CL 106 103 108 108 103  CO2 21* 22 20* 23 25  GLUCOSE 140* 140* 120* 166* 204*  BUN 24* 29* 18 17 17   CREATININE 0.85 0.93 0.75 0.66 0.73  CALCIUM 7.8* 8.7* 7.8* 8.0* 7.9*  MG  --   --   --  1.9 1.9  PHOS  --   --   --   --  2.4*   Liver Function Tests: Recent Labs  Lab 11/28/19 0810 12/07/2019 1403 12/02/19 0841 12/03/19 0311 18-Dec-2019 0230  AST 22 15  --  7* 9*  ALT 23 25  --  14 17  ALKPHOS 44 44  --  31* 34*  BILITOT 4.0* 3.5* 2.0* 1.7* 1.7*  PROT 5.7* 6.3*  --  5.2* 5.5*  ALBUMIN 3.4* 3.5  --  2.9* 2.7*   No results for input(s): LIPASE, AMYLASE in the last 168 hours. No results for input(s): AMMONIA in the last 168 hours. Cardiac Enzymes: No results for input(s): CKTOTAL, CKMB, CKMBINDEX, TROPONINI in the last 168 hours. BNP (last 3 results) Recent Labs    10/10/19 1154 11/05/19 1635  BNP 130.7* 104.9*    ProBNP (last 3 results) No results for input(s): PROBNP in the last 8760 hours.  CBG: No results for input(s): GLUCAP in the last 168 hours. Recent Results (from the past 240 hour(s))  Culture, blood (Routine X 2) w Reflex to ID Panel     Status: None (Preliminary result)   Collection Time: 12/03/19 12:26 PM   Specimen: BLOOD  Result Value Ref Range Status   Specimen Description   Final    BLOOD LEFT ARM Performed at [facility] [address], [place], [place] [address]    Special Requests   Final    BOTTLES DRAWN AEROBIC AND ANAEROBIC Blood Culture adequate volume Performed at [facility]  [address], [place], [place] [address]    Culture   Final    NO GROWTH < 24 HOURS Performed at [facility] [address], [place], [place] [address]    Report Status PENDING  Incomplete  Culture, blood (Routine X 2) w Reflex to ID Panel     Status: None (Preliminary result)   Collection Time: 12/03/19 12:26 PM   Specimen: BLOOD  Result Value Ref Range Status   Specimen Description   Final    BLOOD LEFT WRIST Performed at [facility] [address], [place], [place] [address]    Special Requests   Final    BOTTLES DRAWN AEROBIC AND ANAEROBIC Blood Culture adequate volume Performed at [facility] [address] [place], [place] [address]    Culture   Final    NO GROWTH < 24 HOURS Performed at [facility] [address], [place], [place] [address]    Report Status PENDING  Incomplete  Culture, Urine     Status: None   Collection Time: 12/03/19 12:38 PM   Specimen: Urine, Clean Catch  Result Value Ref Range Status   Specimen Description   Final    URINE, CLEAN CATCH Performed at [facility], [place] [address], [place], [place] [address]    Special Requests   Final    Immunocompromised Performed at [facility], [place] [address] [place], [place] [address]    Culture   Final    NO GROWTH Performed at [facility], [place] [address] [place],  [address]    Report Status 2019/12/18 FINAL  Final     Studies: DG CHEST PORT 1 VIEW  Result Date: 2019/12/18 CLINICAL DATA:  Shortness of breath, cough EXAM: PORTABLE CHEST 1 VIEW COMPARISON:  12/10/2019 FINDINGS: Patchy bilateral airspace disease has worsened since prior study concerning for worsening pneumonia. Cardiomegaly. No effusions or acute bony abnormality. IMPRESSION: Worsening bilateral multifocal pneumonia Electronically Signed   By: Lennette Bihari  [place] M.D.   On: 12/06/19 08:58      Flora Lipps, MD  [facility] 12/06/19

## 2019-12-17 NOTE — Progress Notes (Addendum)
NAME:  Alexandra Pierce, MRN:  [id number], DOB:  07-Feb-1946, LOS: 3 ADMISSION DATE:  12/16/2019, CONSULTATION DATE:  12/03/19 REFERRING MD:  Joycelyn Das, MD CHIEF COMPLAINT: Dyspnea, hypoxemia  Brief History   74 year old female with recent COVID-19 infection admitted for symptomatic anemia. PCCM consulted for recommendations on acute hypoxemic respiratory failure, dyspnea  History of present illness   Ms. Alexandra Pierce is a 74 year old female with MDS, atrial fibrillation on Xarelto and recent COVID-19 pneumonia who presented with fever, cough and shortness of breath and found acute on chronic blood loss anemia. During hospital course she has been transfused blood, started on antibiotics for CAP and diuresed however continues to have persistent hypoxemia. For her COVID 19 infection diagnosed on 10/24/19, she was previously treated with dexamethasone and remdesivir and discharged home 2L O2 at home. PCCM consulted for further recommendations.   Patient reports dyspnea on minimal exertion. Between multiple hospitalizations (12/23-12/26, 1/6-1/11, 1/18-1/21) for transfusion dependent MDS and COVID 19 infection, she has not been active but only previously required 2-3L O2 via [place]. Currently on 6-7L O2 with SpO2 98%.  Past Medical History  Myelodysplastic syndrome with hemolytic anemia, hx CVA, paroxysmal atrial fibrillation, HTN, HLX  Significant Hospital Events   2/13 Admitted  Consults:  [facility] PCCM  Procedures:    Significant Diagnostic Tests:  CXR 12/16/2019 personally reviewed and interpreted by me - Bilateral patchy opacities predominantly in the bases  CTA 11/05/19 personally reviewed and interpreted by me - No pulmonary emboli, bilateral peripheral patchy opacities increased in the lung bases, cardiomegaly  PFTs 04/21/18 - No evidence of obstruction on spirometry though flow-volume loops still suggestive of underlying obstruction. Gas exchange is also moderately impaired.  Micro Data:  BCX  12/03/19> UCX 12/03/19>  Antimicrobials:  Ceftriaxone 2/13> Azithro 2/13>  Interim history/subjective:  Developed increased SOB with increased oxygen demands 0900. Now requiring HFNC at 15 L / min and 100% NRB Desating into the low 80's>> improved with NRB Worsening pneumonia per CXR 1.9  L  Per I&O, but crackles per bases Converted into SVT with HR in the 160 range, maxed out on cardizem at 15 per hour   Objective   Blood pressure 116/65, pulse (!) 119, temperature 99 F (37.2 C), temperature source Oral, resp. rate (!) 26, height 5\' 6"  (1.676 m), weight 81.3 kg, SpO2 100 %.    FiO2 (%):  [100 %] 100 %   Intake/Output Summary (Last 24 hours) at 12/15/2019 0913 Last data filed at 11/29/2019 0436 Gross per 24 hour  Intake 1773.24 ml  Output 2000 ml  Net -226.76 ml   Filed Weights   12/02/19 0318 12/03/19 0420 11/28/2019 0400  Weight: 76 kg 79.1 kg 81.3 kg   Physical Exam: General: Elderly-appearing, in respiratory distress HENT: [place], AT, OP clear, MMM Eyes: EOMI, no scleral icterus Respiratory: Diminished breath sounds bilaterally.  Few basilar crackles, + wheezing , increased WOB Cardiovascular: S1, S2, SVT, -M/R/G, no JVD GI: BS+, soft, nontender, ND Extremities:-Edema,-tenderness Neuro: AAO x4, CNII-XII grossly intact Skin: Intact, no rashes or bruising Psych: Anxious  Resolved Hospital Problem list     Assessment & Plan:   Acute on chronic hypoxemic respiratory failure. Likely multifactorial in setting of symptomatic anemia, prior COVID-19 infection and deconditioning. Recently discharged on home oxygen and steroids. Primary team concerned for co-comitant bacterial pneumonia so also being treated with CAP coverage. Patient has non-contributory smoking history (5 pack-years, quit >50 years ago). Now requiring  - Will add  heated high flow [place] - Low thresh hold for intubation - CXR with worsening bilateral multi focal pneumonia 2/16 - Monitor closely for continued  decompensation>> states she would want to be intubated --Will add Vanc and Cefepime, Continue Azithro for atypicals - Will replete Mag --Management of anemia per primary team --Will convert po prednisone to Solu medrol --Continue  Pulmicort and Duoneb inhalers - Lasix 40 x 1 --Wean supplemental oxygen for SpO2 goal 88-92% as able --PT as tolerated --If hypoxemia remains persistent, we can consider CT Chest later this hospitalization   Acute blood loss anemia in setting of MDS. Recently admission for symptomatic anemia requiring IVIG. --Per primary team --Hem/Onc following  Atrial fibrillation with RVR Converted to SVT 2/16 at 0900 Plan Will initiate amio gtt with bolus as rate is in the 160's Will give mag 2 grams now Rate/rhythm control per Cardiology No anticoagulation due to acute blood loss anemia  Pt with worsening hypoxia now with increased oxygen requirements and worsening multi focal  Pneumonia, and a fib with RVR.  She does wish to be intubated if needed.   Best practice:  Diet: Per primary Pain/Anxiety/Delirium protocol (if indicated): -- VAP protocol (if indicated): -- DVT prophylaxis: Per primary GI prophylaxis: Per primary Glucose control: Per primary Mobility: Encourage PT Code Status: Full code Family Communication: Discussed plan with patient and daughter at bedside Disposition:   Labs   CBC: Recent Labs  Lab 11/28/19 0810 11/28/19 0810 12/11/2019 1403 12/14/2019 1844 12/02/19 0838 12/03/19 0311 12/03/19 2037 12-16-2019 0230  WBC 4.5  --  7.4  --  4.2 4.2  --  3.9*  NEUTROABS 3.6  --   --  6.7  --  3.3  --   --   HGB 6.3*  --  5.8*  --  7.1* 6.6* 8.3* 7.7*  HCT 18.2*   < > 16.2*  --  22.5* 20.8* 25.2* 23.9*  MCV 95.3  --  95.9  --  94.1 93.3  --  91.6  PLT 311  --  313  --  215 209  --  184   < > = values in this interval not displayed.    Basic Metabolic Panel: Recent Labs  Lab 11/28/19 0810 11/28/2019 1403 12/02/19 0838 12/03/19 0311  2019-12-16 0230  NA 140 136 137 139 137  K 5.1 3.9 3.8 4.4 3.6  CL 106 103 108 108 103  CO2 21* 22 20* 23 25  GLUCOSE 140* 140* 120* 166* 204*  BUN 24* 29* 18 17 17   CREATININE 0.85 0.93 0.75 0.66 0.73  CALCIUM 7.8* 8.7* 7.8* 8.0* 7.9*  MG  --   --   --  1.9 1.9  PHOS  --   --   --   --  2.4*   GFR: Estimated Creatinine Clearance: 65.3 mL/min (by C-G formula based on SCr of 0.73 mg/dL). Recent Labs  Lab 11/22/2019 1403 12/02/19 0838 12/03/19 0311 12/16/19 0230  WBC 7.4 4.2 4.2 3.9*    Liver Function Tests: Recent Labs  Lab 11/28/19 0810 11/30/2019 1403 12/02/19 0841 12/03/19 0311 2019-12-16 0230  AST 22 15  --  7* 9*  ALT 23 25  --  14 17  ALKPHOS 44 44  --  31* 34*  BILITOT 4.0* 3.5* 2.0* 1.7* 1.7*  PROT 5.7* 6.3*  --  5.2* 5.5*  ALBUMIN 3.4* 3.5  --  2.9* 2.7*   No results for input(s): LIPASE, AMYLASE in the last 168 hours. No results for input(s): AMMONIA  in the last 168 hours.  ABG    Component Value Date/Time   TCO2 26 01/11/2014 1102     Coagulation Profile: No results for input(s): INR, PROTIME in the last 168 hours.  Cardiac Enzymes: No results for input(s): CKTOTAL, CKMB, CKMBINDEX, TROPONINI in the last 168 hours.  HbA1C: Hgb A1c MFr Bld  Date/Time Value Ref Range Status  10/11/2019 09:57 PM 5.1 4.8 - 5.6 % Final    Comment:    (NOTE) Pre diabetes:          5.7%-6.4% Diabetes:              >6.4% Glycemic control for   <7.0% adults with diabetes   01/11/2014 10:42 AM 5.6 <5.7 % Final    Comment:    (NOTE)                                                                       According to the ADA Clinical Practice Recommendations for 2011, when HbA1c is used as a screening test:  >=6.5%   Diagnostic of Diabetes Mellitus           (if abnormal result is confirmed) 5.7-6.4%   Increased risk of developing Diabetes Mellitus References:Diagnosis and Classification of Diabetes Mellitus,Diabetes Care,2011,34(Suppl 1):S62-S69 and Standards of Medical  Care in         Diabetes - 2011,Diabetes Care,2011,34 (Suppl 1):S11-S61.    CBG: No results for input(s): GLUCAP in the last 168 hours.   Past Medical History  She,  has a past medical history of Chronic lower back pain, Hypercholesteremia, Hypertension, Obesity, Paroxysmal atrial fibrillation (HCC), and Stroke (HCC) (2001 & 2015).   Surgical History    Past Surgical History:  Procedure Laterality Date  . CARDIAC CATHETERIZATION  03/31/2000   EF 49%/LAD lg vessel which coursed to the apex & gave rise to 2 diagonal branches/ LAD noted to have 30% ostial lesion & tapers to a sm vessel toward the apex but no high grade lesion/1st diagonal med sized vessel & 2nd diagonal sm vessel with no significant disease/ lt ventriculogram reveals mild global hypokinesis w/ an estimated EF of 45-50%  . CARDIAC CATHETERIZATION N/A 03/17/2016   Procedure: Left Heart Cath and Coronary Angiography;  Surgeon: Iran Ouch, MD;  Location: [facility] INVASIVE CV LAB;  Service: Cardiovascular;  Laterality: N/A;  . LEFT HEART CATH AND CORONARY ANGIOGRAPHY N/A 03/31/2018   Procedure: LEFT HEART CATH AND CORONARY ANGIOGRAPHY;  Surgeon: Swazil[place], Peter M, MD;  [facility] INVASIVE CV LAB;  Service: Cardiovascular;  Laterality: N/A;     Social History   reports that she quit smoking about 41 years ago. Her smoking use included cigarettes. She has a 1.80 pack-year smoking history. She has never used smokeless tobacco. She reports current alcohol use of about 14.0 standard drinks of alcohol per week. She reports that she does not use drugs.   Family History   Her family history includes Asthma in her father; Diabetes in her father; Heart disease in her brother, father, and mother; Hyperlipidemia in her brother, brother, and sister. There is no history of Cancer.   Allergies Allergies  Allergen Reactions  . Sulfonamide Derivatives Anaphylaxis  . Lipitor [Atorvastatin] Other (See Comments)  Caused bladder infection  .  Pravachol [Pravastatin Sodium] Other (See Comments)    Myalgias  . Statins Other (See Comments)    Myalgia     Home Medications  Prior to Admission medications   Medication Sig Start Date End Date Taking? Authorizing Tzirel Leonor  Ascorbic Acid (VITAMIN C PO) Take 1 tablet by mouth daily.   Yes Sierra Spargo, Historical, MD  b complex vitamins capsule Take 1 capsule by mouth daily.    Yes Harlie Ragle, Historical, MD  cholecalciferol (VITAMIN D) 1000 UNITS tablet Take 1,000 Units by mouth daily.   Yes Avalynne Diver, Historical, MD  cyanocobalamin 1000 MCG tablet Take 1,000 mcg by mouth daily.    Yes Kazimierz Springborn, Historical, MD  fenofibrate 160 MG tablet Take 1 tablet (160 mg total) by mouth daily. 02/16/19  Yes Rosalio Macadamia, NP  fluticasone (FLONASE) 50 MCG/ACT nasal spray Place 1 spray into both nostrils daily.   Yes Kenyanna Grzesiak, Historical, MD  folic acid (FOLVITE) 1 MG tablet Take 5 tablets (5 mg total) by mouth daily. Patient taking differently: Take 2 mg by mouth daily.  10/13/19  Yes Briant Cedar, MD  metoprolol tartrate (LOPRESSOR) 25 MG tablet Take 0.5 tablets (12.5 mg total) by mouth 2 (two) times daily. 11/08/19  Yes Swayze, Ava, DO  nitrofurantoin, macrocrystal-monohydrate, (MACROBID) 100 MG capsule Take 100 mg by mouth 2 (two) times daily. 11/27/19  Yes Kaleo Condrey, Historical, MD  predniSONE (DELTASONE) 20 MG tablet Take 3 tablets (60 mg total) by mouth daily with breakfast. 11/08/19  Yes Swayze, Ava, DO  XARELTO 20 MG TABS tablet TAKE 1 TABLET BY MOUTH ONCE DAILY Patient taking differently: Take 20 mg by mouth daily.  04/18/19  Yes [place], Peter M, MD     Care time: 13 minutes    Bevelyn Ngo, MSN, AGACNP-BC [facility] Pulmonary/Critical Care Medicine Pager # [phone] After 4 pm please call [phone] 12/12/2019 9:13 AM   CC APP time 40 minutes

## 2019-12-17 NOTE — Progress Notes (Signed)
Dr. Vaughan Browner called to bedside for oxygen saturations in the 60-70% range. Very difficult to get a good waveform due to patient temperature and movement. Intubation supplies prepped at bedside as well as central line supplies. Medications given during intubation were 100mg  Rocuronium, 79mcg fentanyl, 20mg  Etomidate, and 2mg  of Midazolam. Pt had 3 peripheral IVs and all were patent. Amiodarone and Cardizem paused during intubation. Heart rate slowly started to decrease after intubation was successful (time 1255). Code was called at 1301 and compressions started.  Medications given during code were as follows:4 epinephrine, 2 bicarb.  Levophed and epi drip started. ROSC achieved at 1315. central line placed by Dr. Vaughan Browner. During central line placement, patient blood pressure declined and compressions were started yet again at 1332. 3 epinephrine given during code. ROSC achieved 1337. Conversation had with family about whether they wanted to escalate care if and when the patient coded again. Alexandra Pierce (daughter) informed staff that she did not want to resuscitate the patient if her heart stopped again. Patient expired at 54 with daughter at her bedside.  PCCM notified. Absence of heart sounds confirmed by myself and Yehuda Budd, RN.

## 2019-12-17 DEATH — deceased

## 2019-12-20 ENCOUNTER — Other Ambulatory Visit: Payer: Medicare Other

## 2019-12-24 ENCOUNTER — Other Ambulatory Visit: Payer: Medicare Other

## 2019-12-27 ENCOUNTER — Other Ambulatory Visit: Payer: Medicare Other

## 2020-02-02 ENCOUNTER — Other Ambulatory Visit: Payer: Self-pay | Admitting: Nurse Practitioner

## 2020-02-27 ENCOUNTER — Ambulatory Visit: Payer: Medicare Other | Admitting: Nurse Practitioner

## 2020-07-31 ENCOUNTER — Other Ambulatory Visit: Payer: Self-pay | Admitting: Hematology
